# Patient Record
Sex: Male | Born: 1935 | Race: White | Hispanic: No | Marital: Married | Smoking: Former smoker
Health system: Southern US, Community
[De-identification: ages and names within clinical notes are randomized; demographics above are authoritative.]

## PROBLEM LIST (undated history)

## (undated) DIAGNOSIS — R918 Other nonspecific abnormal finding of lung field: Secondary | ICD-10-CM

## (undated) DIAGNOSIS — Z789 Other specified health status: Secondary | ICD-10-CM

## (undated) DIAGNOSIS — I2510 Atherosclerotic heart disease of native coronary artery without angina pectoris: Secondary | ICD-10-CM

## (undated) DIAGNOSIS — E785 Hyperlipidemia, unspecified: Secondary | ICD-10-CM

## (undated) DIAGNOSIS — I340 Nonrheumatic mitral (valve) insufficiency: Secondary | ICD-10-CM

## (undated) DIAGNOSIS — I10 Essential (primary) hypertension: Secondary | ICD-10-CM

## (undated) DIAGNOSIS — I714 Abdominal aortic aneurysm, without rupture: Secondary | ICD-10-CM

## (undated) DIAGNOSIS — I255 Ischemic cardiomyopathy: Secondary | ICD-10-CM

## (undated) DIAGNOSIS — I4821 Permanent atrial fibrillation: Secondary | ICD-10-CM

## (undated) DIAGNOSIS — I5022 Chronic systolic (congestive) heart failure: Secondary | ICD-10-CM

## (undated) HISTORY — DX: Abdominal aortic aneurysm, without rupture: I71.4

## (undated) HISTORY — DX: Atherosclerotic heart disease of native coronary artery without angina pectoris: I25.10

## (undated) HISTORY — DX: Nonrheumatic mitral (valve) insufficiency: I34.0

## (undated) HISTORY — DX: Permanent atrial fibrillation: I48.21

## (undated) HISTORY — DX: Hyperlipidemia, unspecified: E78.5

## (undated) HISTORY — DX: Chronic systolic (congestive) heart failure: I50.22

## (undated) HISTORY — PX: ABDOMINAL AORTIC ANEURYSM REPAIR: SHX42

## (undated) HISTORY — DX: Ischemic cardiomyopathy: I25.5

## (undated) HISTORY — PX: OTHER SURGICAL HISTORY: SHX169

## (undated) HISTORY — DX: Essential (primary) hypertension: I10

## (undated) HISTORY — DX: Other nonspecific abnormal finding of lung field: R91.8

## (undated) HISTORY — DX: Other specified health status: Z78.9

---

## 2003-07-04 HISTORY — PX: CORONARY ANGIOPLASTY WITH STENT PLACEMENT: SHX49

## 2004-01-01 DIAGNOSIS — I714 Abdominal aortic aneurysm, without rupture, unspecified: Secondary | ICD-10-CM

## 2004-01-01 HISTORY — DX: Abdominal aortic aneurysm, without rupture: I71.4

## 2004-01-01 HISTORY — DX: Abdominal aortic aneurysm, without rupture, unspecified: I71.40

## 2004-01-25 ENCOUNTER — Other Ambulatory Visit: Payer: Self-pay

## 2004-01-25 DIAGNOSIS — I219 Acute myocardial infarction, unspecified: Secondary | ICD-10-CM

## 2004-01-25 DIAGNOSIS — I2510 Atherosclerotic heart disease of native coronary artery without angina pectoris: Secondary | ICD-10-CM

## 2004-01-25 DIAGNOSIS — Z9861 Coronary angioplasty status: Secondary | ICD-10-CM

## 2004-01-25 HISTORY — DX: Atherosclerotic heart disease of native coronary artery without angina pectoris: I25.10

## 2004-04-02 ENCOUNTER — Encounter: Payer: Self-pay | Admitting: Family Medicine

## 2004-05-02 ENCOUNTER — Ambulatory Visit: Payer: Self-pay | Admitting: Family Medicine

## 2004-05-03 ENCOUNTER — Encounter: Payer: Self-pay | Admitting: Family Medicine

## 2004-05-17 ENCOUNTER — Ambulatory Visit: Payer: Self-pay | Admitting: General Surgery

## 2004-07-03 HISTORY — PX: ABDOMINAL AORTIC ANEURYSM REPAIR: SUR1152

## 2004-11-14 ENCOUNTER — Ambulatory Visit: Payer: Self-pay | Admitting: General Surgery

## 2005-06-27 ENCOUNTER — Other Ambulatory Visit: Payer: Self-pay

## 2005-06-28 ENCOUNTER — Inpatient Hospital Stay: Payer: Self-pay

## 2005-11-07 ENCOUNTER — Ambulatory Visit: Payer: Self-pay | Admitting: General Surgery

## 2006-11-14 ENCOUNTER — Ambulatory Visit: Payer: Self-pay | Admitting: General Surgery

## 2007-07-04 HISTORY — PX: CARDIAC CATHETERIZATION: SHX172

## 2007-11-04 ENCOUNTER — Ambulatory Visit: Payer: Self-pay | Admitting: General Surgery

## 2007-12-02 DIAGNOSIS — T82390A Other mechanical complication of aortic (bifurcation) graft (replacement), initial encounter: Secondary | ICD-10-CM

## 2009-11-08 ENCOUNTER — Other Ambulatory Visit: Payer: Self-pay | Admitting: Family Medicine

## 2010-09-14 ENCOUNTER — Ambulatory Visit: Payer: Self-pay | Admitting: Family Medicine

## 2011-06-20 ENCOUNTER — Emergency Department: Payer: Self-pay | Admitting: Emergency Medicine

## 2011-08-14 ENCOUNTER — Ambulatory Visit: Payer: Self-pay | Admitting: Family Medicine

## 2011-08-21 ENCOUNTER — Ambulatory Visit: Payer: Self-pay | Admitting: Family Medicine

## 2011-09-02 ENCOUNTER — Encounter: Payer: Self-pay | Admitting: Internal Medicine

## 2011-09-03 ENCOUNTER — Observation Stay: Payer: Self-pay | Admitting: Internal Medicine

## 2011-09-03 LAB — CBC
HCT: 38.5 % — ABNORMAL LOW (ref 40.0–52.0)
MCH: 31.2 pg (ref 26.0–34.0)
MCV: 95 fL (ref 80–100)
Platelet: 154 10*3/uL (ref 150–440)
RDW: 15.4 % — ABNORMAL HIGH (ref 11.5–14.5)

## 2011-09-03 LAB — URINALYSIS, COMPLETE
Bilirubin,UR: NEGATIVE
Blood: NEGATIVE
Leukocyte Esterase: NEGATIVE
Ph: 6 (ref 4.5–8.0)
Protein: NEGATIVE
Squamous Epithelial: NONE SEEN

## 2011-09-03 LAB — COMPREHENSIVE METABOLIC PANEL
Alkaline Phosphatase: 71 U/L (ref 50–136)
Anion Gap: 14 (ref 7–16)
Bilirubin,Total: 0.9 mg/dL (ref 0.2–1.0)
Chloride: 106 mmol/L (ref 98–107)
Co2: 20 mmol/L — ABNORMAL LOW (ref 21–32)
EGFR (African American): >60
EGFR (Non-African Amer.): 50 — ABNORMAL LOW
Osmolality: 285 (ref 275–301)
SGOT(AST): 28 U/L (ref 15–37)
SGPT (ALT): 21 U/L
Total Protein: 8.1 g/dL (ref 6.4–8.2)

## 2011-09-03 LAB — PROTIME-INR: INR: 2.1

## 2011-09-03 LAB — CK TOTAL AND CKMB (NOT AT ARMC)
CK, Total: 62 U/L (ref 35–232)
CK-MB: 1.4 ng/mL (ref 0.5–3.6)

## 2011-09-03 LAB — PRO B NATRIURETIC PEPTIDE: B-Type Natriuretic Peptide: 3441 pg/mL — ABNORMAL HIGH (ref 0–450)

## 2011-09-04 LAB — CBC WITH DIFFERENTIAL/PLATELET
Basophil #: 0 10*3/uL (ref 0.0–0.1)
Basophil %: 0.5 %
Eosinophil %: 2.3 %
HGB: 11.8 g/dL — ABNORMAL LOW (ref 13.0–18.0)
Lymphocyte #: 1.4 10*3/uL (ref 1.0–3.6)
MCH: 30.8 pg (ref 26.0–34.0)
MCHC: 32.3 g/dL (ref 32.0–36.0)
MCV: 95 fL (ref 80–100)
Monocyte #: 0.4 10*3/uL (ref 0.0–0.7)
Monocyte %: 8.4 %
Neutrophil %: 62.5 %
RBC: 3.84 10*6/uL — ABNORMAL LOW (ref 4.40–5.90)
WBC: 5.3 10*3/uL (ref 3.8–10.6)

## 2011-09-04 LAB — BASIC METABOLIC PANEL
Anion Gap: 11 (ref 7–16)
BUN: 24 mg/dL — ABNORMAL HIGH (ref 7–18)
Calcium, Total: 8.8 mg/dL (ref 8.5–10.1)
EGFR (African American): >60
EGFR (Non-African Amer.): >60
Glucose: 97 mg/dL (ref 65–99)
Osmolality: 287 (ref 275–301)
Potassium: 4.1 mmol/L (ref 3.5–5.1)
Sodium: 142 mmol/L (ref 136–145)

## 2011-09-04 LAB — MAGNESIUM: Magnesium: 1.8 mg/dL

## 2011-09-04 LAB — LIPID PANEL
Cholesterol: 155 mg/dL (ref 0–200)
HDL Cholesterol: 24 mg/dL — ABNORMAL LOW (ref 40–60)
Triglycerides: 84 mg/dL (ref 0–200)

## 2011-09-04 LAB — TROPONIN I: Troponin-I: 0.02 ng/mL

## 2011-09-04 LAB — HEMOGLOBIN A1C: Hemoglobin A1C: 6.2 % (ref 4.2–6.3)

## 2011-12-01 ENCOUNTER — Encounter: Payer: Self-pay | Admitting: Cardiology

## 2011-12-01 ENCOUNTER — Encounter: Payer: Self-pay | Admitting: Internal Medicine

## 2011-12-01 ENCOUNTER — Ambulatory Visit (INDEPENDENT_AMBULATORY_CARE_PROVIDER_SITE_OTHER): Payer: Medicare Other | Admitting: Internal Medicine

## 2011-12-01 VITALS — BP 108/66 | HR 85 | Ht 73.5 in | Wt 208.8 lb

## 2011-12-01 DIAGNOSIS — I2589 Other forms of chronic ischemic heart disease: Secondary | ICD-10-CM

## 2011-12-01 DIAGNOSIS — I454 Nonspecific intraventricular block: Secondary | ICD-10-CM | POA: Insufficient documentation

## 2011-12-01 DIAGNOSIS — I255 Ischemic cardiomyopathy: Secondary | ICD-10-CM

## 2011-12-01 DIAGNOSIS — I5022 Chronic systolic (congestive) heart failure: Secondary | ICD-10-CM

## 2011-12-01 DIAGNOSIS — I455 Other specified heart block: Secondary | ICD-10-CM

## 2011-12-01 DIAGNOSIS — I4891 Unspecified atrial fibrillation: Secondary | ICD-10-CM

## 2011-12-01 DIAGNOSIS — I259 Chronic ischemic heart disease, unspecified: Secondary | ICD-10-CM | POA: Insufficient documentation

## 2011-12-01 MED ORDER — METOPROLOL SUCCINATE ER 50 MG PO TB24: 50.0000 mg | ORAL_TABLET | Freq: Every day | ORAL | Status: DC

## 2011-12-01 MED ORDER — LISINOPRIL 2.5 MG PO TABS: 2.5000 mg | ORAL_TABLET | Freq: Every day | ORAL | Status: DC

## 2011-12-01 MED ORDER — DIGOXIN 125 MCG PO TABS: 0.1250 mg | ORAL_TABLET | Freq: Every day | ORAL | Status: DC

## 2011-12-01 NOTE — Assessment & Plan Note (Signed)
As above.

## 2011-12-01 NOTE — Assessment & Plan Note (Signed)
Take QRS duration of 136 and an IVCD pattern, it would not be appropriate to implant CRT at the time of ICD implantation unless we are going to accompany it by AV junction ablation.

## 2011-12-01 NOTE — Assessment & Plan Note (Signed)
The patient has a history of MI left ventricular dysfunction despite beta blockers and ACE inhibitors and congestive heart failure-class II-3. There are 2 issues. The first is that he he is appropriately considered for an ICD with the mitigating factors been appreciated of age and ischemic heart disease. The other issue though is atrial fibrillation with a rapid rate. On his treadmill his heart rate was 130 after 1 minute of exercise and this was reproduced her which is a little bit of lifting of his leg. Control of his heart rate I think give [place] our best chance of symptom control. He was previously on metoprolol succinate and this was recently changed to carvedilol and while he did not notice an appreciable difference on the former drug, I still think is a reasonable choice given his limited blood pressure. In addition, we will add digoxin not withstanding its potential risks given his left ventricular dysfunction and atrial fibrillation.  We will anticipate a Holter monitor in 3-4 weeks to see to what degree we have been able to control his heart rate. In the event that we have done so with exertion, we will then follow his left ventricular function. In the event that we cannot control his heart rate with exertion with medications, I would recommend CRT and AV junction ablation.

## 2011-12-01 NOTE — Patient Instructions (Signed)
Your physician has recommended you make the following change in your medication:  1) Stop coreg (carvedilol). 2) Start toprol (metoprolol succinate) 50 mg one tablet by mouth once daily- please call Dr. Odessa Fleming nurse, Herbert Seta at [phone] today, and let me know what strength tablet you have at home. 3) Decrease lisinopril to 2.5 mg once daily. 4) Start digoxin 0.125 mg once daily.  Your physician has recommended that you wear a 24 hour holter monitor in 2-3 weeks in the [place] office. Holter monitors are medical devices that record the heart's electrical activity. Doctors most often use these monitors to diagnose arrhythmias. Arrhythmias are problems with the speed or rhythm of the heartbeat. The monitor is a small, portable device. You can wear one while you do your normal daily activities. This is usually used to diagnose what is causing palpitations/syncope (passing out).  Your physician recommends that you schedule a follow-up appointment in: 3-4 weeks with Dr. Graciela Husbands in the [place] office.

## 2011-12-01 NOTE — Progress Notes (Signed)
History and Physical  Patient ID: Joseph Barton MRN: [id number], SOB: 11/11/1926 78 y.o. Date of Encounter: 12/01/2011, 10:18 AM  Primary Physician: Lorie Phenix, MD, MD Primary Cardiologist: AP Primary Electrophysiologist:  SK  Chief Complaint: ICD  History of Present Illness: Joseph Barton is a 78 y.o. male  with a history of ischemic heart disease and prior stenting and MI, permanent atrial fibrillation who is referred for consideration of ICD implantation. His insurance does not allow him to be followed in the [facility] network.  He underwent Myoview scanning April 2013 demonstrating an ejection fraction of 13%. He has been on beta blockers and ACE inhibitors for a long time. Echocardiogram confirmed depressed left ventricular function moderate MR and TR and there was no evidence of ischemia on his Myoview. Electrocardiogram measured March 2013 demonstrating significantly rapid rates.  His major complaint is exercise intolerance associated with tachycardia palpitations. Interestingly on his treadmill test in March his heart rate was 130 at 1 minute of exercise. He has some orthopnea but no nocturnal dyspnea he has no edema.  He has a remote history of syncope.  Past Medical History  Diagnosis Date  . Ischemic cardiomyopathy   . Atrial fibrillation, chronic   . Hyperlipidemia   . Myocardial infarction 01-25-2004    Status post anterior myocardial infarction  . Abdominal aortic aneurysm 01-2004    4.7 x 4.7 cm.  . Pulmonary nodules     Noted on abdominal CT     Past Surgical History  Procedure Date  . Cypher stents to lad     01/25/2004  . Abdominal aortic aneurysm repair     12/02/2007 [facility]      Current Outpatient Prescriptions  Medication Sig Dispense Refill  . Bioflavonoid Products (BIOFLEX PO) Take by mouth.      . carvedilol (COREG) 6.25 MG tablet Take 6.25 mg by mouth 2 (two) times daily with a meal.      . fish oil-omega-3 fatty acids 1000 MG capsule Take  1,200 mg by mouth daily.       . furosemide (LASIX) 20 MG tablet Take 20 mg by mouth daily.       Marland Kitchen lisinopril (PRINIVIL,ZESTRIL) 5 MG tablet Take 5 mg by mouth daily.      . niacin 500 MG CR capsule Take 500 mg by mouth. Take three capsules by mouth once daily.       Marland Kitchen omeprazole (PRILOSEC) 40 MG capsule Take 40 mg by mouth daily.      Marland Kitchen warfarin (COUMADIN) 2.5 MG tablet Take 2.5 mg by mouth as directed.         Allergies: No Known Allergies   History  Substance Use Topics  . Smoking status: Former Smoker    Quit date: 12/01/1987  . Smokeless tobacco: Not on file  . Alcohol Use: No      Family History  Problem Relation Age of Onset  . Heart attack Father 43    Died w/ Heart attack  . Heart attack Brother       ROS:  Please see the history of present illness.     All other systems reviewed and negative.   Vital Signs: Blood pressure 108/66, pulse 85, height 6' 1.5" (1.867 m), weight 208 lb 12.8 oz (94.711 kg).  PHYSICAL EXAM: General:  Well nourished, well developed male in no acute distress HEENT: normal Lymph: no adenopathy Neck: no JVD Endocrine:  No thryomegaly Vascular: No carotid bruits; FA pulses 2+ bilaterally without bruits  Cardiac:  normal S1, S2; irregular rate and rhythm with a 2-3/6 murmur at the apex Back: without kyphosis/scoliosis, no CVA tenderness Lungs:  clear to auscultation bilaterally, no wheezing, rhonchi or rales Abd: soft, nontender, no hepatomegaly Ext: Trace edema Musculoskeletal:  No deformities, BUE and BLE strength normal and equal Skin: warm and dry Neuro:  CNs 2-12 intact, no focal abnormalities noted Psych:  Normal affect   EKG:  Atrial fibrillation and 85 Intervals-/0.14/0.40 Axis is leftward at -64 IVCD  Labs:       ASSESSMENT AND PLAN:

## 2011-12-01 NOTE — Progress Notes (Signed)
Addended by: Sherri Rad C on: 12/01/2011 02:45 PM   Modules accepted: Orders

## 2011-12-19 ENCOUNTER — Encounter (INDEPENDENT_AMBULATORY_CARE_PROVIDER_SITE_OTHER): Payer: Medicare Other

## 2011-12-19 DIAGNOSIS — R55 Syncope and collapse: Secondary | ICD-10-CM

## 2011-12-19 DIAGNOSIS — R002 Palpitations: Secondary | ICD-10-CM

## 2011-12-19 DIAGNOSIS — I255 Ischemic cardiomyopathy: Secondary | ICD-10-CM

## 2011-12-22 ENCOUNTER — Encounter: Payer: Self-pay | Admitting: Internal Medicine

## 2012-01-02 ENCOUNTER — Ambulatory Visit (INDEPENDENT_AMBULATORY_CARE_PROVIDER_SITE_OTHER): Payer: Medicare Other | Admitting: Internal Medicine

## 2012-01-02 ENCOUNTER — Encounter: Payer: Self-pay | Admitting: Internal Medicine

## 2012-01-02 VITALS — BP 112/80 | HR 76 | Ht 73.5 in | Wt 205.8 lb

## 2012-01-02 DIAGNOSIS — I2589 Other forms of chronic ischemic heart disease: Secondary | ICD-10-CM

## 2012-01-02 DIAGNOSIS — I4891 Unspecified atrial fibrillation: Secondary | ICD-10-CM

## 2012-01-02 DIAGNOSIS — I255 Ischemic cardiomyopathy: Secondary | ICD-10-CM

## 2012-01-02 DIAGNOSIS — I5022 Chronic systolic (congestive) heart failure: Secondary | ICD-10-CM

## 2012-01-02 NOTE — Assessment & Plan Note (Signed)
Atrial fibrillation is persistent. Heart rate is much better controlled. Heart rates range from 38-108 with averages in the 50 range. We will decrease his metoprolol from 50-25 mg daily. We will measure his digoxin level

## 2012-01-02 NOTE — Patient Instructions (Addendum)
Your physician wants you to follow-up end of August with Dr. Graciela Husbands. You will receive a reminder letter in the mail two months in advance. If you don't receive a letter, please call our office to schedule the follow-up appointment.  Your physician has recommended you make the following change in your medication: Decrease metoprolol to 25 mg daily.  Your physician has requested that you have an echocardiogram with Dr. Darrold Junker. Echocardiography is a painless test that uses sound waves to create images of your heart. It provides your doctor with information about the size and shape of your heart and how well your heart's chambers and valves are working. This procedure takes approximately one hour. There are no restrictions for this procedure.  You need to come back at your convenience for lab work.

## 2012-01-02 NOTE — Assessment & Plan Note (Signed)
We will plan to reassess left ventricular function and to determine whether he may still need a ICD now that we have accomplished rate control

## 2012-01-02 NOTE — Progress Notes (Signed)
  HPI  Joseph Barton is a 78 y.o. male  Seen in followup for cardiomyopathy and consideration for ICD implantation in the setting of ischemic heart disease and a broad IVCD.  He was noted to have atrial fibrillation with a rapid rate. It was elected to pursue rate control initially with medications and potentially if that didn't work AV junction and ablation  He comes in today in followup of Holter monitor undertaking undertaken following the initiation of digoxin and changing his carvedilol to metoprolol. He is feeling much better with improved exercise tolerance. There is no lightheadedness.   Past Medical History  Diagnosis Date  . Ischemic cardiomyopathy   . Atrial fibrillation, chronic   . Hyperlipidemia   . Myocardial infarction 01-25-2004    Status post anterior myocardial infarction  . Abdominal aortic aneurysm 01-2004    4.7 x 4.7 cm.  . Pulmonary nodules     Noted on abdominal CT    Past Surgical History  Procedure Date  . Cypher stents to lad     01/25/2004  . Abdominal aortic aneurysm repair     12/02/2007 [facility]    Current Outpatient Prescriptions  Medication Sig Dispense Refill  . Bioflavonoid Products (BIOFLEX PO) Take by mouth.      . digoxin (LANOXIN) 0.125 MG tablet Take 1 tablet (0.125 mg total) by mouth daily.  90 tablet  3  . furosemide (LASIX) 20 MG tablet Take 20 mg by mouth daily.       Marland Kitchen lisinopril (PRINIVIL,ZESTRIL) 2.5 MG tablet Take 1 tablet (2.5 mg total) by mouth daily.  90 tablet  3  . metoprolol succinate (TOPROL-XL) 50 MG 24 hr tablet Take 1 tablet (50 mg total) by mouth daily. Take with or immediately following a meal.  90 tablet  3  . niacin 500 MG CR capsule Take three capsules by mouth once daily.      . Omega-3 Fatty Acids (FISH OIL) 1200 MG CAPS Take 1,200 mg by mouth daily.      Marland Kitchen omeprazole (PRILOSEC) 20 MG capsule Take 20 mg by mouth daily.      Marland Kitchen warfarin (COUMADIN) 2.5 MG tablet Take 2.5 mg by mouth as directed.        No  Known Allergies  Review of Systems negative except from HPI and PMH  Physical Exam BP 112/80  Pulse 76  Ht 6' 1.5" (1.867 m)  Wt 205 lb 12 oz (93.328 kg)  BMI 26.78 kg/m2 Well developed and well nourished in no acute distress HENT normal E scleral and icterus clear Neck Supple JVP flat; carotids brisk and full Clear to ausculation irregularly irregular And slow rate and rhythm, no murmurs gallops or rub Soft with active bowel sounds No clubbing cyanosis none Edema Alert and oriented, grossly normal motor and sensory function Skin Warm and Dry  Holter monitor demonstrated reasonable heart rate control. If there is some bradycardia it appears to be asymptomatic. There were no pauses of greater than 3 seconds.  Assessment and  Plan

## 2012-01-02 NOTE — Assessment & Plan Note (Signed)
Improved as noted above. He may also be a candidate for aldosterone antagonism

## 2012-01-05 ENCOUNTER — Other Ambulatory Visit (INDEPENDENT_AMBULATORY_CARE_PROVIDER_SITE_OTHER): Payer: Medicare Other

## 2012-01-05 DIAGNOSIS — I4891 Unspecified atrial fibrillation: Secondary | ICD-10-CM

## 2012-01-05 DIAGNOSIS — I255 Ischemic cardiomyopathy: Secondary | ICD-10-CM

## 2012-01-05 DIAGNOSIS — I5022 Chronic systolic (congestive) heart failure: Secondary | ICD-10-CM

## 2012-02-19 ENCOUNTER — Telehealth: Payer: Self-pay

## 2012-02-19 NOTE — Telephone Encounter (Signed)
Pt's wife called, says you left message on machine last Friday 02/16/12, but wife does not know why.  She would like you to call her back. Thanks!

## 2012-02-20 NOTE — Telephone Encounter (Signed)
Notified patient's wife just checking to make sure the patient had an Echo scheduled before the office visit with Dr. Graciela Husbands on Aug. 29, 2013.

## 2012-02-29 ENCOUNTER — Encounter: Payer: Self-pay | Admitting: Internal Medicine

## 2012-02-29 ENCOUNTER — Ambulatory Visit (INDEPENDENT_AMBULATORY_CARE_PROVIDER_SITE_OTHER): Payer: Medicare Other | Admitting: Internal Medicine

## 2012-02-29 VITALS — BP 102/60 | HR 60 | Ht 73.0 in | Wt 204.0 lb

## 2012-02-29 DIAGNOSIS — I4891 Unspecified atrial fibrillation: Secondary | ICD-10-CM

## 2012-02-29 NOTE — Progress Notes (Signed)
[facility] Patient Care Team: Lorie Phenix, MD as PCP - General (Family Medicine)   HPI  Joseph Barton is a 78 y.o. male Seen in followup for cardiomyopathy and consideration for ICD implantation in the setting of ischemic heart disease and a broad QRS with nonspecific IVCD. He been noted to have atrial fibrillation with a rapid rate it was elected to pursue rate control initially seen at an impact on left ventricular function. He was seen last at the beginning of July. Much improved on digoxin and carvedilol>> metoprolol  repea echo EF35% with much improved symptoms   Past Medical History  Diagnosis Date  . Ischemic cardiomyopathy   . Atrial fibrillation, chronic   . Hyperlipidemia   . Myocardial infarction 01-25-2004    Status post anterior myocardial infarction  . Abdominal aortic aneurysm 01-2004    4.7 x 4.7 cm.  . Pulmonary nodules     Noted on abdominal CT    Past Surgical History  Procedure Date  . Cypher stents to lad     01/25/2004  . Abdominal aortic aneurysm repair     12/02/2007 [facility]    Current Outpatient Prescriptions  Medication Sig Dispense Refill  . Bioflavonoid Products (BIOFLEX PO) Take by mouth.      . digoxin (LANOXIN) 0.125 MG tablet Take 1 tablet (0.125 mg total) by mouth daily.  90 tablet  3  . furosemide (LASIX) 20 MG tablet Take 20 mg by mouth daily.       Marland Kitchen lisinopril (PRINIVIL,ZESTRIL) 2.5 MG tablet Take 1 tablet (2.5 mg total) by mouth daily.  90 tablet  3  . metoprolol succinate (TOPROL-XL) 25 MG 24 hr tablet Take 25 mg by mouth daily.      . niacin 500 MG CR capsule Take three capsules by mouth once daily.      . Omega-3 Fatty Acids (FISH OIL) 1200 MG CAPS Take 1,200 mg by mouth daily.      Marland Kitchen omeprazole (PRILOSEC) 20 MG capsule Take 20 mg by mouth daily.      Marland Kitchen warfarin (COUMADIN) 2.5 MG tablet Take 2.5 mg by mouth as directed.        No Known Allergies  Review of Systems negative except from HPI and PMH  Physical Exam BP 102/60   Pulse 60  Ht 6\' 1"  (1.854 m)  Wt 204 lb (92.534 kg)  BMI 26.91 kg/m2 Well developed and nourished in no acute distress HENT normal Neck supple with JVP-flat Carotids brisk and full without bruits Clear Irregularly irregular rate and rhythm with controlled ventricular response, no murmurs or gallops Abd-soft with active BS without hepatomegaly No Clubbing cyanosis edema Skin-warm and dry A & Oriented  Grossly normal sensory and motor function  >ecg ECG: AF  @65                Assessment and  Plan

## 2012-02-29 NOTE — Patient Instructions (Addendum)
Your physician wants you to follow-up as needed.  You will receive a reminder letter in the mail two months in advance. If you don't receive a letter, please call our office to schedule the follow-up appointment.  

## 2013-09-27 DIAGNOSIS — Z789 Other specified health status: Secondary | ICD-10-CM | POA: Insufficient documentation

## 2013-09-27 DIAGNOSIS — E785 Hyperlipidemia, unspecified: Secondary | ICD-10-CM | POA: Insufficient documentation

## 2013-09-27 DIAGNOSIS — I255 Ischemic cardiomyopathy: Secondary | ICD-10-CM | POA: Insufficient documentation

## 2013-09-27 DIAGNOSIS — R918 Other nonspecific abnormal finding of lung field: Secondary | ICD-10-CM | POA: Insufficient documentation

## 2014-02-27 LAB — LIPID PANEL
Cholesterol: 251 mg/dL — AB (ref 0–200)
HDL: 40 mg/dL (ref 35–70)
LDL Cholesterol: 183 mg/dL
LDl/HDL Ratio: 4.6
Triglycerides: 139 mg/dL (ref 40–160)

## 2014-07-03 HISTORY — PX: SKIN SURGERY: SHX2413

## 2014-07-21 DIAGNOSIS — I4891 Unspecified atrial fibrillation: Secondary | ICD-10-CM | POA: Diagnosis not present

## 2014-08-18 DIAGNOSIS — I4891 Unspecified atrial fibrillation: Secondary | ICD-10-CM | POA: Diagnosis not present

## 2014-09-15 DIAGNOSIS — I4891 Unspecified atrial fibrillation: Secondary | ICD-10-CM | POA: Diagnosis not present

## 2014-10-09 DIAGNOSIS — I509 Heart failure, unspecified: Secondary | ICD-10-CM | POA: Diagnosis not present

## 2014-10-09 DIAGNOSIS — G629 Polyneuropathy, unspecified: Secondary | ICD-10-CM | POA: Diagnosis not present

## 2014-10-09 LAB — BASIC METABOLIC PANEL
BUN: 21 mg/dL (ref 4–21)
CREATININE: 1.1 mg/dL (ref 0.6–1.3)
GLUCOSE: 98 mg/dL
POTASSIUM: 4.7 mmol/L (ref 3.4–5.3)
SODIUM: 138 mmol/L (ref 137–147)

## 2014-10-09 LAB — CBC AND DIFFERENTIAL
HCT: 43 % (ref 41–53)
Hemoglobin: 14.5 g/dL (ref 13.5–17.5)
Neutrophils Absolute: 2 /uL
Platelets: 167 10*3/uL (ref 150–399)
WBC: 4.6 10^3/mL

## 2014-10-09 LAB — HEPATIC FUNCTION PANEL
ALK PHOS: 70 U/L (ref 25–125)
ALT: 10 U/L (ref 10–40)
AST: 21 U/L (ref 14–40)
Bilirubin, Total: 0.8 mg/dL

## 2014-10-09 LAB — TSH: TSH: 4.71 u[IU]/mL (ref 0.41–5.90)

## 2014-10-09 LAB — HEMOGLOBIN A1C: HEMOGLOBIN A1C: 6

## 2014-10-15 DIAGNOSIS — H6123 Impacted cerumen, bilateral: Secondary | ICD-10-CM | POA: Diagnosis not present

## 2014-10-15 DIAGNOSIS — H6063 Unspecified chronic otitis externa, bilateral: Secondary | ICD-10-CM | POA: Diagnosis not present

## 2014-10-25 NOTE — Discharge Summary (Signed)
PATIENT NAME:  Joseph Barton, Joseph Barton MR#:  [id number] DATE OF BIRTH:  December 14, 1926  DATE OF ADMISSION:  09/03/2011 DATE OF DISCHARGE:  09/04/2011  PRIMARY CARE PHYSICIAN: Margarita Rana, MD  FINAL DIAGNOSES:  1. Acute congestive heart failure from rapid atrial fibrillation.  2. Rapid atrial fibrillation.  3. Coronary artery disease.  4. Acute renal failure, resolved.  5. History of pulmonary embolus in the past.  6. Gastroesophageal reflux disease.   DISCHARGE MEDICATIONS: 1. Lisinopril 5 mg daily.  2. Coumadin 2.5 mg daily.  3. Coreg increased to 6.25 mg twice a day.  4. Lasix 20 mg p.o. daily.  5. Omeprazole 20 mg p.o. daily.   ACTIVITY: As tolerated.   DIET: Low sodium diet.   DISCHARGE FOLLOWUP: Followup with Dr. Saralyn Pilar this week if possible and with Dr. Venia Minks in two weeks.   REASON FOR ADMISSION: The patient was admitted on 09/03/2011 and discharge 09/04/2011. He came in with acute shortness of breath.   HISTORY OF PRESENT ILLNESS: This is an 81 year old man with history of MI status post stenting, congestive heart failure with unknown ejection fraction, and history of atrial fibrillation on Coumadin who presented with acute shortness of breath, progressive in nature, acutely worsening last night. He was found to be in atrial fibrillation with rapid ventricular response with heart rate into the 30s. He was given one dose of diltiazem and had better heart rate after that. He was started with low-dose Cardizem and continued on his Coreg, low dose. He was given IV Lasix.   LABS/STUDIES: EKG showed atrial fibrillation, rapid ventricular response, right bundle branch block, left anterior fascicular block.   Troponin negative. TSH 3.41. INR 2.1. White blood cell count 5.8, hemoglobin and hematocrit 12.6 and 38.5, platelet count 154. Glucose 122, BUN 26, creatinine 1.44, sodium 140, potassium 4.4, chloride 106, CO2 20, calcium 8.9. Liver function tests normal. BNP 3441.   Chest x-ray was  consistent with congestive heart failure.  Urinalysis was negative.   The next two troponins were negative. LDL 114, HDL 24, triglycerides 84. Hemoglobin A1c 6.2. Repeat creatinine 1.14.   HOSPITAL COURSE PER PROBLEM LIST:  1. Acute congestive heart failure: I believe this is secondary to rapid atrial fibrillation. Once heart rate was controlled, the patient was diuresed completely. An echocardiogram was done as an outpatient through Dr. Saralyn Pilar' office. Ejection fraction is unknown at this point. The patient was treated with a beta blocker, Coreg 6.25 mg twice a day, lisinopril 5 mg daily, and Lasix 20 mg p.o. daily. The patient's lungs were clear upon discharge.  2. Rapid atrial fibrillation: The patient was initially given IV Cardizem and then switched over to low dose p.o. Cardizem. Since the patient's heart rate went down into the 50s to 60s area, but was up at the 80s and 90s upon discharge, it was felt that we can do it with one medication. I spoke with Dr. Nehemiah Massed who recommended increasing the Coreg to twice a day and stopping the Cardizem.  3. Coronary artery disease: The patient is on Coreg. Cardiac enzymes negative.  4. Acute renal failure, resolved, even with diuresis.  5. History of pulmonary embolism in the past: The patient is on Coumadin, most likely for the atrial fibrillation. INR is therapeutic.          DISPOSITION: The patient was discharged home in stable condition.   TIME SPENT ON DISCHARGE: 35 minutes. ____________________________ Tana Conch. Leslye Peer, MD [id number]:slb D: 09/04/2011 17:14:36 ET T: 09/05/2011 14:08:50 ET JOB#: [id number]  cc: Tana Conch. Leslye Peer, MD, <Dictator> Jerrell Belfast, MD Isaias Cowman, MD Marisue Brooklyn MD ELECTRONICALLY SIGNED 09/06/2011 13:12

## 2014-10-25 NOTE — H&P (Signed)
PATIENT NAME:  Joseph Barton, Joseph Barton MR#:  [id number] DATE OF BIRTH:  [birth year]  DATE OF ADMISSION:  09/03/2011  REFERRING PHYSICIAN: Robb Matar, MD     PRIMARY CARE PHYSICIAN: Jerrell Belfast, MD   CARDIOLOGIST: Isaias Cowman, MD   CHIEF COMPLAINT: Acute onset shortness of breath.   HISTORY OF PRESENT ILLNESS: The patient is an 81 year old Caucasian male with history of myocardial infarction, status post stenting, congestive heart failure, unknown ejection fraction, and history of atrial fibrillation on Coumadin, who presents with acute onset shortness of breath which is progressive in nature with acutely worsening last night about 3:00 a.m. The patient states that over the last several days he has felt increased dyspnea on exertion and shortness of breath. He gets more labored with activities. He has had several x-rays of the chest in the last month or so for a cough which had initially been productive with yellow sputum without having any fevers or chills. He went to his cardiologist's office and had an echo done on Friday. He was told by the echo tech that, "Your heart is racing." He did not know what the rate was at that time. He has had two nights of orthopnea, sleeping on two pillows where he usually sleeps on one pillow. He denies any lower extremity edema, however. On arrival, he was found with atrial fibrillation with RVR with rates in the 130s by EMS strip and with [place] it was 120s. He got Lasix 20 mg IV as well as diltiazem 10 mg IV x1. He has had a good urine output, and the heart rate is better in the last 80s and 90s. His shortness of breath has significantly improved as well. Hospitalist Services were contacted for further evaluation and management.   PAST MEDICAL HISTORY:  1. Congestive heart failure, unclear if this is a systolic or diastolic or what the ejection fraction is.  2. Hypertension, but blood pressure is generally on the lower side. 3. Myocardial infarction, status post  stenting.  4. Atrial fibrillation.  5. History of PE.    PAST SURGICAL HISTORY: Abdominal aneurysm repair in 2009, status post stenting.   SOCIAL HISTORY: He quit tobacco multiple years ago. No alcohol or drugs. He is a retired Games developer.  MEDICATIONS:  1. Niacin. 2. Coenzyme Q10 over-the-counter. 3. Furosemide, unknown dose. 4. Lisinopril 5 mg daily.  5. Coreg 3.125 mg b.i.d.  6. Coumadin 2.5 mg daily.  7. Omeprazole 20 mg daily.   FAMILY HISTORY: Brother, sister and father with coronary artery disease.   REVIEW OF SYSTEMS: CONSTITUTIONAL: No fever. There is some fatigue. No weight changes. EYES: No blurry vision or double vision. ENT: No tinnitus or hearing loss. RESPIRATORY: Had a productive cough, which has resolved. No wheezing. There is worsening dyspnea on exertion. No painful respiration. CARDIOVASCULAR: No chest pains. Positive for orthopnea. No lower extremity edema. History of atrial fibrillation and high blood pressure. Some dyspnea on exertion recently. GI: No nausea, vomiting, or diarrhea. Abdominal pain. GENITOURINARY: No dysuria or hematuria. ENDOCRINE: No polyuria or nocturia. HEMATOLOGIC/LYMPHATIC: No anemia or easy bruising. SKIN: No rashes. MUSCULOSKELETAL: Denies arthritis. NEUROLOGIC: No numbness, weakness, or dementia. PSYCHIATRIC: No anxiety or insomnia.   PHYSICAL EXAMINATION:  VITAL SIGNS: Pulse on arrival 130, currently 87, temperature 97.7, respiratory rate 18, blood pressure 105/57, oxygen saturation 99% stated as on room air, however, he is on 4 liters of oxygen.   GENERAL: The patient is an elderly Caucasian male lying in bed in no obvious distress, talking  in full sentences.   HEENT: Normocephalic, atraumatic. Pupils are equal and reactive. Anicteric sclerae. Moist mucous membranes.   NECK: Supple. No JVD or hepatojugular reflex noted.  LUNGS: Scattered crackles, good air entry.   CARDIOVASCULAR: S1, S2, irregularly irregular and mild murmur left  sternum.   ABDOMEN: Soft, nontender, hyperactive bowel sounds. No organomegaly noted.   EXTREMITIES: No significant edema.   NEUROLOGICAL: Cranial nerves II through XII are grossly intact. Strength five out of five in all extremities.   PSYCHIATRIC: Awake, alert, oriented x3. Affect is appropriate. Mood is appropriate.   LABORATORY, DIAGNOSTIC AND RADIOLOGICAL DATA:  BNP elevated at 3441. Glucose 122, BUN 26, creatinine 1.44, potassium 4.4.  LFTs within normal limits.  Troponin negative x1. TSH 3.41.  WBC 5.8, hemoglobin 12.6, hematocrit 38.5, platelets 154, INR 2.1.  Urinalysis: No evidence of infection.  X-ray of the chest: Findings consistent with congestive heart failure with mild interstitial edema, worse than 08/21/2011.  EKG showing atrial fibrillation with RVR, rate is 126. Left anterior fascicular and right bundle branch blocks. Previous septal infarct, no acute ST elevations or depressions.   ASSESSMENT AND PLAN: We have an 81 year old Caucasian male with a history of congestive heart failure, unknown ejection fraction, who has undergone echocardiogram as an outpatient two days ago, with a history of atrial fibrillation-on Coumadin, coronary artery disease, status post myocardial infarction and stenting, as well as repair of abdominal aortic aneurysm, who presents with progressive shortness of breath likely from worsening congestive heart failure and atrial fibrillation with RVR.   1. Atrial fibrillation with rapid ventricular response: He has had Cardizem 10 mg IV, and rate has been controlled. He is not on Cardizem as an outpatient and is on a low dose of Coreg 3.125 mg for rate control. This likely is in the setting of the patient's blood pressure being on the lower side. He is on Coumadin, and INR is therapeutic at 2.1. At this point, I would try a dose of 30 mg of Cardizem every 6 hours to see if the blood pressure can tolerate it. He is rate controlled currently, and his  shortness of breath has improved. I would continue the low-dose Coreg. 2. Shortness of breath:  This likely is acute on chronic congestive heart failure. However, as he recently had an echocardiogram I would not do it on this admission. I would consult with [facility] and obtain the echocardiogram result. I would hold the ACE inhibitor, given the acute renal failure. He is on Lasix as an outpatient; however, he does not know the dose. He denies having increased fluid intake. This likely is in the setting of the atrial fibrillation with the RVR. He has dramatically improved in terms of his breathing and shortness of breath with the Lasix and has had about 1.5 liters of urine output total. I would check ins and outs and daily weights and continue the Lasix at 20 mg IV daily. I  would resume the beta blocker and consider continuing the ACE inhibitor once renal function improves.  3. Acute renal failure: This likely is in the setting of congestive heart failure and atrial fibrillation with probably poor forward flow. I would hold ACE inhibitor and recheck this in the a.m. with gentle diuresis.  4. Coronary artery disease: The patient denies having any chest pains, and troponins are negative x1. The patient is not on aspirin as an outpatient. I will check a lipid profile.  5. Hypertension: The patient's blood pressure generally is on the  lower side, per patient; and he is on low dose Coreg and ACE inhibitor. I would see how his blood pressure does on the Cardizem, and if he cannot tolerate it I would probably stop that and go up on Coreg, if blood pressure can tolerate.  6. Deep vein thrombosis/GI prophylaxis: The patient is on Coumadin. INR is therapeutic. I would continue his omeprazole.   CODE STATUS:  The patient is FULL CODE.     TOTAL TIME SPENT: 50 minutes.   ____________________________ Vivien Presto, MD sa:cbb D: 09/03/2011 10:33:38 ET T: 09/03/2011 11:37:03 ET JOB#: [id number]  cc: Vivien Presto, MD, <Dictator> Jerrell Belfast, MD Isaias Cowman, MD Vivien Presto MD ELECTRONICALLY SIGNED 09/07/2011 15:36

## 2014-10-27 DIAGNOSIS — I4891 Unspecified atrial fibrillation: Secondary | ICD-10-CM | POA: Diagnosis not present

## 2014-11-23 DIAGNOSIS — I259 Chronic ischemic heart disease, unspecified: Secondary | ICD-10-CM | POA: Diagnosis not present

## 2014-11-23 DIAGNOSIS — I214 Non-ST elevation (NSTEMI) myocardial infarction: Secondary | ICD-10-CM | POA: Diagnosis not present

## 2014-11-23 DIAGNOSIS — T82330D Leakage of aortic (bifurcation) graft (replacement), subsequent encounter: Secondary | ICD-10-CM | POA: Diagnosis not present

## 2014-11-23 DIAGNOSIS — I5022 Chronic systolic (congestive) heart failure: Secondary | ICD-10-CM | POA: Diagnosis not present

## 2014-11-27 DIAGNOSIS — I4891 Unspecified atrial fibrillation: Secondary | ICD-10-CM | POA: Diagnosis not present

## 2014-12-25 ENCOUNTER — Ambulatory Visit (INDEPENDENT_AMBULATORY_CARE_PROVIDER_SITE_OTHER): Payer: Medicare Other

## 2014-12-25 ENCOUNTER — Other Ambulatory Visit: Payer: Self-pay

## 2014-12-25 DIAGNOSIS — I4891 Unspecified atrial fibrillation: Secondary | ICD-10-CM | POA: Diagnosis not present

## 2014-12-25 DIAGNOSIS — M1990 Unspecified osteoarthritis, unspecified site: Secondary | ICD-10-CM | POA: Insufficient documentation

## 2014-12-25 LAB — POCT INR
INR: 3
PT: 35.6

## 2014-12-25 NOTE — Patient Instructions (Signed)
Continue current dose of Coumadin 2.5 mg [id number] and other days half a tablet. Return in 4 weeks. Call if you have any questions or concerns.

## 2015-01-22 ENCOUNTER — Ambulatory Visit (INDEPENDENT_AMBULATORY_CARE_PROVIDER_SITE_OTHER): Payer: Medicare Other

## 2015-01-22 DIAGNOSIS — I4891 Unspecified atrial fibrillation: Secondary | ICD-10-CM | POA: Diagnosis not present

## 2015-01-22 LAB — POCT INR
INR: 2.7
PT: 33

## 2015-01-22 NOTE — Patient Instructions (Signed)
INR 2.7   Continue same dose, coumadin 2.5 mg daily(one table) and 1.25 mg(Monday and Thursday) one half table. Come back in 4 weeks Call if any questions or concerns.

## 2015-02-19 ENCOUNTER — Ambulatory Visit (INDEPENDENT_AMBULATORY_CARE_PROVIDER_SITE_OTHER): Payer: Medicare Other

## 2015-02-19 DIAGNOSIS — I4891 Unspecified atrial fibrillation: Secondary | ICD-10-CM

## 2015-02-19 LAB — POCT INR
INR: 2.4
PT: 28.9

## 2015-02-19 NOTE — Patient Instructions (Signed)
Continue current dose of Coumadin 2.5mg  daily (one tablet) and 1.25 mg Monday and Thursday (half a tablet). Return in 4 weeks. Call if any questions or concerns.

## 2015-03-19 ENCOUNTER — Ambulatory Visit (INDEPENDENT_AMBULATORY_CARE_PROVIDER_SITE_OTHER): Payer: Medicare Other

## 2015-03-19 DIAGNOSIS — I4891 Unspecified atrial fibrillation: Secondary | ICD-10-CM | POA: Diagnosis not present

## 2015-03-19 LAB — POCT INR
INR: 2.9
PT: 35.4

## 2015-03-19 NOTE — Patient Instructions (Signed)
Continue current dose of Coumadin 2.5mg  daily (one tablet) and 1.25 mg Monday and Thursday (half a tablet). Return in 4 weeks.

## 2015-04-12 DIAGNOSIS — C4442 Squamous cell carcinoma of skin of scalp and neck: Secondary | ICD-10-CM | POA: Diagnosis not present

## 2015-04-12 DIAGNOSIS — D485 Neoplasm of uncertain behavior of skin: Secondary | ICD-10-CM | POA: Diagnosis not present

## 2015-04-12 DIAGNOSIS — L570 Actinic keratosis: Secondary | ICD-10-CM | POA: Diagnosis not present

## 2015-04-16 ENCOUNTER — Ambulatory Visit (INDEPENDENT_AMBULATORY_CARE_PROVIDER_SITE_OTHER): Payer: Medicare Other

## 2015-04-16 DIAGNOSIS — I4891 Unspecified atrial fibrillation: Secondary | ICD-10-CM | POA: Diagnosis not present

## 2015-04-16 LAB — POCT INR
INR: 2.8
PT: 33.6

## 2015-04-16 NOTE — Patient Instructions (Signed)
Continue current dose; 2.5mg  daily except 1.25mg  Monday and Thursday.

## 2015-04-28 ENCOUNTER — Other Ambulatory Visit: Payer: Self-pay

## 2015-04-28 DIAGNOSIS — K219 Gastro-esophageal reflux disease without esophagitis: Secondary | ICD-10-CM | POA: Insufficient documentation

## 2015-04-28 MED ORDER — OMEPRAZOLE 20 MG PO CPDR: 20.0000 mg | DELAYED_RELEASE_CAPSULE | Freq: Every day | ORAL | Status: DC

## 2015-05-14 ENCOUNTER — Ambulatory Visit (INDEPENDENT_AMBULATORY_CARE_PROVIDER_SITE_OTHER): Payer: Medicare Other

## 2015-05-14 DIAGNOSIS — I4891 Unspecified atrial fibrillation: Secondary | ICD-10-CM | POA: Diagnosis not present

## 2015-05-14 DIAGNOSIS — Z23 Encounter for immunization: Secondary | ICD-10-CM

## 2015-05-14 LAB — POCT INR: INR: 2.2

## 2015-06-09 DIAGNOSIS — L570 Actinic keratosis: Secondary | ICD-10-CM | POA: Diagnosis not present

## 2015-06-09 DIAGNOSIS — L578 Other skin changes due to chronic exposure to nonionizing radiation: Secondary | ICD-10-CM | POA: Diagnosis not present

## 2015-06-09 DIAGNOSIS — C4442 Squamous cell carcinoma of skin of scalp and neck: Secondary | ICD-10-CM | POA: Diagnosis not present

## 2015-06-11 ENCOUNTER — Other Ambulatory Visit: Payer: Self-pay

## 2015-06-11 ENCOUNTER — Ambulatory Visit (INDEPENDENT_AMBULATORY_CARE_PROVIDER_SITE_OTHER): Payer: Medicare Other

## 2015-06-11 DIAGNOSIS — I4891 Unspecified atrial fibrillation: Secondary | ICD-10-CM

## 2015-06-11 DIAGNOSIS — K219 Gastro-esophageal reflux disease without esophagitis: Secondary | ICD-10-CM

## 2015-06-11 LAB — POCT INR
INR: 2.3
PT: 27.6

## 2015-06-11 MED ORDER — OMEPRAZOLE 20 MG PO CPDR: 20.0000 mg | DELAYED_RELEASE_CAPSULE | Freq: Every day | ORAL | Status: DC

## 2015-06-11 NOTE — Patient Instructions (Signed)
Anticoagulation Dose Instructions as of 06/11/2015      Joseph Barton Tue Wed Thu Fri Sat   New Dose 2.5 mg 1.25 mg 2.5 mg 2.5 mg 1.25 mg 2.5 mg 2.5 mg    Description        Continue current dose of Coumadin 2.5mg  daily (one tablet) and 1.25 mg Monday and Thursday (half a tablet). Return in 4 weeks.

## 2015-07-09 ENCOUNTER — Ambulatory Visit (INDEPENDENT_AMBULATORY_CARE_PROVIDER_SITE_OTHER): Payer: Medicare Other

## 2015-07-09 DIAGNOSIS — I4891 Unspecified atrial fibrillation: Secondary | ICD-10-CM

## 2015-07-09 LAB — POCT INR
INR: 2.8
PT: 33

## 2015-07-09 NOTE — Patient Instructions (Signed)
Continue current dose and follow up in 4 weeks.

## 2015-07-09 NOTE — Progress Notes (Signed)
Anticoagulation Dose Instructions as of 07/09/2015      Joseph Barton Tue Wed Thu Fri Sat   New Dose 2.5 mg 1.25 mg 2.5 mg 2.5 mg 1.25 mg 2.5 mg 2.5 mg    Description        Continue current dose of Coumadin 2.5mg  daily (one tablet) and 1.25 mg Monday and Thursday (half a tablet). Return in 4 weeks.

## 2015-08-09 ENCOUNTER — Other Ambulatory Visit: Payer: Self-pay

## 2015-08-09 DIAGNOSIS — I255 Ischemic cardiomyopathy: Secondary | ICD-10-CM

## 2015-08-09 MED ORDER — DIGOXIN 125 MCG PO TABS: 0.1250 mg | ORAL_TABLET | Freq: Every day | ORAL | Status: DC

## 2015-08-09 NOTE — Telephone Encounter (Signed)
Mail order pharmacy requesting refill. Initially prescribed by Dr. Caryl Comes. Was last filled per chart 10/2012.

## 2015-08-10 ENCOUNTER — Other Ambulatory Visit: Payer: Self-pay

## 2015-08-10 DIAGNOSIS — I4891 Unspecified atrial fibrillation: Secondary | ICD-10-CM

## 2015-08-10 MED ORDER — WARFARIN SODIUM 2.5 MG PO TABS
2.5000 mg | ORAL_TABLET | ORAL | Status: DC
Start: 2015-08-10 — End: 2016-03-13

## 2015-08-13 ENCOUNTER — Ambulatory Visit: Payer: Self-pay

## 2015-08-20 ENCOUNTER — Ambulatory Visit (INDEPENDENT_AMBULATORY_CARE_PROVIDER_SITE_OTHER): Payer: Medicare Other | Admitting: Emergency Medicine

## 2015-08-20 DIAGNOSIS — I4891 Unspecified atrial fibrillation: Secondary | ICD-10-CM

## 2015-08-20 LAB — POCT INR: INR: 2.8

## 2015-09-10 DIAGNOSIS — G629 Polyneuropathy, unspecified: Secondary | ICD-10-CM | POA: Insufficient documentation

## 2015-09-10 DIAGNOSIS — G4730 Sleep apnea, unspecified: Secondary | ICD-10-CM | POA: Insufficient documentation

## 2015-09-10 DIAGNOSIS — I10 Essential (primary) hypertension: Secondary | ICD-10-CM | POA: Insufficient documentation

## 2015-09-10 DIAGNOSIS — C4492 Squamous cell carcinoma of skin, unspecified: Secondary | ICD-10-CM | POA: Insufficient documentation

## 2015-09-10 DIAGNOSIS — R739 Hyperglycemia, unspecified: Secondary | ICD-10-CM | POA: Insufficient documentation

## 2015-09-10 DIAGNOSIS — J309 Allergic rhinitis, unspecified: Secondary | ICD-10-CM | POA: Insufficient documentation

## 2015-09-10 DIAGNOSIS — N529 Male erectile dysfunction, unspecified: Secondary | ICD-10-CM | POA: Insufficient documentation

## 2015-09-10 DIAGNOSIS — R001 Bradycardia, unspecified: Secondary | ICD-10-CM | POA: Insufficient documentation

## 2015-09-17 ENCOUNTER — Ambulatory Visit (INDEPENDENT_AMBULATORY_CARE_PROVIDER_SITE_OTHER): Payer: Medicare Other

## 2015-09-17 DIAGNOSIS — I4891 Unspecified atrial fibrillation: Secondary | ICD-10-CM | POA: Diagnosis not present

## 2015-09-17 LAB — POCT INR
INR: 2.6
PT: 31.3

## 2015-09-17 NOTE — Patient Instructions (Signed)
.   Anticoagulation Dose Instructions as of 09/17/2015      Dorene Grebe Tue Wed Thu Fri Sat   New Dose 2.5 mg 1.25 mg 2.5 mg 2.5 mg 1.25 mg 2.5 mg 2.5 mg    Description        Continue current dose of Coumadin 2.5mg  daily (one tablet) and 1.25 mg Monday and Thursday (half a tablet). Return in 4 weeks.

## 2015-10-06 ENCOUNTER — Ambulatory Visit (INDEPENDENT_AMBULATORY_CARE_PROVIDER_SITE_OTHER): Payer: Medicare Other | Admitting: Family Medicine

## 2015-10-06 ENCOUNTER — Encounter: Payer: Self-pay | Admitting: Family Medicine

## 2015-10-06 VITALS — BP 120/70 | HR 80 | Temp 97.0°F | Resp 16 | Ht 73.0 in | Wt 193.0 lb

## 2015-10-06 DIAGNOSIS — I10 Essential (primary) hypertension: Secondary | ICD-10-CM

## 2015-10-06 DIAGNOSIS — R739 Hyperglycemia, unspecified: Secondary | ICD-10-CM | POA: Diagnosis not present

## 2015-10-06 DIAGNOSIS — E039 Hypothyroidism, unspecified: Secondary | ICD-10-CM

## 2015-10-06 DIAGNOSIS — E038 Other specified hypothyroidism: Secondary | ICD-10-CM

## 2015-10-06 DIAGNOSIS — I4891 Unspecified atrial fibrillation: Secondary | ICD-10-CM

## 2015-10-06 DIAGNOSIS — Z0000 Encounter for general adult medical examination without abnormal findings: Secondary | ICD-10-CM

## 2015-10-06 DIAGNOSIS — E785 Hyperlipidemia, unspecified: Secondary | ICD-10-CM | POA: Diagnosis not present

## 2015-10-06 NOTE — Progress Notes (Signed)
Patient ID: Joseph Barton, male   DOB: 10-12-1926, 82 y.o.   MRN: [id number]       Patient: Joseph Barton, Male    DOB: 03/05/1927, 82 y.o.   MRN: [id number] Visit Date: 10/06/2015  Today's Provider: Margarita Rana, MD   Chief Complaint  Patient presents with  . Medicare Wellness   Subjective:    Annual wellness visit Joseph Barton is a 82 y.o. male. He feels well. He reports exercising active with daily activies. He reports he is sleeping well.  06/20/13 CPE  -----------------------------------------------------------   Review of Systems  Constitutional: Negative.   HENT: Negative.   Eyes: Negative.   Respiratory: Negative.   Cardiovascular: Negative.   Gastrointestinal: Negative.   Endocrine: Negative.   Genitourinary: Negative.   Musculoskeletal: Positive for back pain.  Skin: Negative.   Allergic/Immunologic: Negative.   Neurological: Negative.   Hematological: Negative.   Psychiatric/Behavioral: Negative.     Social History   Social History  . Marital Status: Married    Spouse Name: N/A  . Number of Children: 1  . Years of Education: N/A   Occupational History  . Not on file.   Social History Main Topics  . Smoking status: Former Smoker    Quit date: 12/01/1987  . Smokeless tobacco: Never Used  . Alcohol Use: No  . Drug Use: No  . Sexual Activity: Not on file   Other Topics Concern  . Not on file   Social History Narrative    Past Medical History  Diagnosis Date  . Ischemic cardiomyopathy   . Atrial fibrillation, chronic ([place])   . Hyperlipidemia   . Myocardial infarction [facility]) 01-25-2004    Status post anterior myocardial infarction  . Abdominal aortic aneurysm ([place]) 01-2004    4.7 x 4.7 cm.  . Pulmonary nodules     Noted on abdominal CT     Patient Active Problem List   Diagnosis Date Noted  . Subclinical hypothyroidism 10/06/2015  . Allergic rhinitis 09/10/2015  . Bradycardia 09/10/2015  . Failure of erection 09/10/2015  . Blood glucose  elevated 09/10/2015  . BP (high blood pressure) 09/10/2015  . Neuropathy ([place]) 09/10/2015  . Apnea, sleep 09/10/2015  . Cancer of skin, squamous cell 09/10/2015  . Acid reflux 04/28/2015  . Arthritis, degenerative 12/25/2014  . Cardiomyopathy, ischemic 09/27/2013  . HLD (hyperlipidemia) 09/27/2013  . Lung nodule, multiple 09/27/2013  . Drug intolerance 09/27/2013  . Ischemic cardiomyopathy 12/01/2011  . Atrial fibrillation ([place]) 12/01/2011  . Chronic systolic heart failure ([place]) 12/01/2011  . IVCD (intraventricular conduction defect) 12/01/2011  . Intraventricular block 12/01/2011  . Myocardial ischemia 12/01/2011  . 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 CAD S/P percutaneous coronary angioplasty 01/25/2004  . Heart attack ([place]) 01/25/2004  . Myocardial infarction ([place]) 01/25/2004  . AAA (abdominal aortic aneurysm) ([place]) 01/01/2004    Past Surgical History  Procedure Laterality Date  . Cypher stents to lad      01/25/2004  . Abdominal aortic aneurysm repair      12/02/2007 [facility]  . Skin surgery  2016    [facility] skin cancer    His family history includes Diabetes in his father and sister; Heart attack in his brother; Heart attack (age of onset: 52) in his father; Heart disease in his brother and sister.    Previous Medications   BIOFLAVONOID PRODUCTS (BIOFLEX PO)    Take 1 tablet by  mouth daily.    CETIRIZINE HCL (ZYRTEC ALLERGY) 10 MG CAPS    Take 1 capsule by mouth daily.    DIGOXIN (LANOXIN) 0.125 MG TABLET    Take 1 tablet (0.125 mg total) by mouth daily.   FUROSEMIDE (LASIX) 20 MG TABLET    Take 20 mg by mouth daily.    GABAPENTIN (NEURONTIN) 100 MG CAPSULE    Take by mouth. Reported on 10/06/2015   LISINOPRIL (PRINIVIL,ZESTRIL) 2.5 MG TABLET    Take 2.5 mg by mouth daily.    METOPROLOL SUCCINATE (TOPROL-XL) 25 MG 24 HR TABLET    Take 25 mg by mouth daily.   NIACIN (NIASPAN) 500 MG  CR TABLET    Take 500 mg by mouth at bedtime.    OMEGA-3 FATTY ACIDS (FISH OIL) 1200 MG CAPS    Take 1,200 mg by mouth daily.   OMEPRAZOLE (PRILOSEC) 20 MG CAPSULE    Take 1 capsule (20 mg total) by mouth daily.   WARFARIN (COUMADIN) 2.5 MG TABLET    Take 1 tablet (2.5 mg total) by mouth as directed.    Patient Care Team: Margarita Rana, MD as PCP - General (Family Medicine)     Objective:   Vitals: BP 120/70 mmHg  Pulse 80  Temp(Src) 97 F (36.1 C) (Oral)  Resp 16  Ht 6\' 1"  (1.854 m)  Wt 193 lb (87.544 kg)  BMI 25.47 kg/m2  Physical Exam  Constitutional: He is oriented to person, place, and time. He appears well-developed and well-nourished.  HENT:  Head: Normocephalic and atraumatic.  Right Ear: External ear normal.  Left Ear: External ear normal.  Nose: Nose normal.  Mouth/Throat: Oropharynx is clear and moist.  Eyes: Conjunctivae and EOM are normal. Pupils are equal, round, and reactive to light.  Neck: Normal range of motion. Neck supple.  Cardiovascular: Normal rate, normal heart sounds and intact distal pulses.  An irregularly irregular rhythm present.  Pulmonary/Chest: Effort normal and breath sounds normal.  Abdominal: Soft. Bowel sounds are normal.  Musculoskeletal: Normal range of motion.  Neurological: He is alert and oriented to person, place, and time. He has normal reflexes.  Skin: Skin is warm and dry.  Psychiatric: He has a normal mood and affect. His behavior is normal. Judgment and thought content normal.    Activities of Daily Living In your present state of health, do you have any difficulty performing the following activities: 10/06/2015  Hearing? N  Vision? N  Difficulty concentrating or making decisions? N  Walking or climbing stairs? N  Dressing or bathing? N  Doing errands, shopping? N    Fall Risk Assessment Fall Risk  10/06/2015  Falls in the past year? No     Depression Screen PHQ 2/9 Scores 10/06/2015  PHQ - 2 Score 0    Cognitive  Testing - 6-CIT  Correct? Score   What year is it? yes 0 0 or 4  What month is it? yes 0 0 or 3  Memorize:    Pia Mau,  44,  [place],      What time is it? (within 1 hour) yes 0 0 or 3  Count backwards from 20 yes 0 0, 2, or 4  Name the months of the year yes 0 0, 2, or 4  Repeat name & address above no 3 0, 2, 4, 6, 8, or 10       TOTAL SCORE  3/28   Interpretation:  Normal  Normal (0-7) Abnormal (  8-28)       Assessment & Plan:     Annual Wellness Visit  Reviewed patient's Family Medical History Reviewed and updated list of patient's medical providers Assessment of cognitive impairment was done Assessed patient's functional ability Established a written schedule for health screening [place] Completed and Reviewed  Exercise Activities and Dietary recommendations Goals    None      Immunization History  Administered Date(s) Administered  . Influenza,inj,Quad PF,36+ Mos 05/14/2015  . Pneumococcal Conjugate-13 03/20/2014       1. Medicare annual wellness visit, subsequent Stable. Patient advised to continue eating healthy and exercise daily.  2. Essential hypertension F/U pending lab report. - CBC with Differential/Platelet - Comprehensive metabolic panel  3. Blood glucose elevated - Hemoglobin A1c  4. HLD (hyperlipidemia) - Lipid Panel With LDL/HDL Ratio  5. Subclinical hypothyroidism - T4 AND TSH  6. Atrial fibrillation, unspecified type ([place]) - Digoxin level     Patient seen and examined by Dr. Jerrell Belfast, and note scribed by Philbert Riser. Dimas, CMA.  I have reviewed the document for accuracy and completeness and I agree with above. Jerrell Belfast, MD   Margarita Rana, MD   ------------------------------------------------------------------------------------------------------------

## 2015-10-11 DIAGNOSIS — I4891 Unspecified atrial fibrillation: Secondary | ICD-10-CM | POA: Diagnosis not present

## 2015-10-11 DIAGNOSIS — I10 Essential (primary) hypertension: Secondary | ICD-10-CM | POA: Diagnosis not present

## 2015-10-11 DIAGNOSIS — E785 Hyperlipidemia, unspecified: Secondary | ICD-10-CM | POA: Diagnosis not present

## 2015-10-11 DIAGNOSIS — R739 Hyperglycemia, unspecified: Secondary | ICD-10-CM | POA: Diagnosis not present

## 2015-10-11 DIAGNOSIS — E038 Other specified hypothyroidism: Secondary | ICD-10-CM | POA: Diagnosis not present

## 2015-10-12 LAB — CBC WITH DIFFERENTIAL/PLATELET
BASOS: 1 %
Basophils Absolute: 0 10*3/uL (ref 0.0–0.2)
EOS (ABSOLUTE): 0.2 10*3/uL (ref 0.0–0.4)
EOS: 3 %
HEMOGLOBIN: 14.3 g/dL (ref 12.6–17.7)
Hematocrit: 43 % (ref 37.5–51.0)
IMMATURE GRANS (ABS): 0 10*3/uL (ref 0.0–0.1)
IMMATURE GRANULOCYTES: 0 %
LYMPHS: 28 %
Lymphocytes Absolute: 1.5 10*3/uL (ref 0.7–3.1)
MCH: 30.8 pg (ref 26.6–33.0)
MCHC: 33.3 g/dL (ref 31.5–35.7)
MCV: 93 fL (ref 79–97)
MONOCYTES: 9 %
Monocytes Absolute: 0.5 10*3/uL (ref 0.1–0.9)
NEUTROS ABS: 3.2 10*3/uL (ref 1.4–7.0)
NEUTROS PCT: 59 %
PLATELETS: 150 10*3/uL (ref 150–379)
RBC: 4.65 x10E6/uL (ref 4.14–5.80)
RDW: 14.4 % (ref 12.3–15.4)
WBC: 5.5 10*3/uL (ref 3.4–10.8)

## 2015-10-12 LAB — COMPREHENSIVE METABOLIC PANEL
A/G RATIO: 1.3 (ref 1.2–2.2)
ALBUMIN: 4 g/dL (ref 3.5–4.7)
ALT: 14 IU/L (ref 0–44)
AST: 20 IU/L (ref 0–40)
Alkaline Phosphatase: 63 IU/L (ref 39–117)
BUN / CREAT RATIO: 21 (ref 10–24)
BUN: 23 mg/dL (ref 8–27)
Bilirubin Total: 0.9 mg/dL (ref 0.0–1.2)
CALCIUM: 9.4 mg/dL (ref 8.6–10.2)
CO2: 21 mmol/L (ref 18–29)
Chloride: 100 mmol/L (ref 96–106)
Creatinine, Ser: 1.07 mg/dL (ref 0.76–1.27)
GFR, EST AFRICAN AMERICAN: 72 mL/min/{1.73_m2} (ref >59)
GFR, EST NON AFRICAN AMERICAN: 62 mL/min/{1.73_m2} (ref >59)
Globulin, Total: 3.1 g/dL (ref 1.5–4.5)
Glucose: 111 mg/dL — ABNORMAL HIGH (ref 65–99)
POTASSIUM: 4.9 mmol/L (ref 3.5–5.2)
Sodium: 142 mmol/L (ref 134–144)
TOTAL PROTEIN: 7.1 g/dL (ref 6.0–8.5)

## 2015-10-12 LAB — T4 AND TSH
T4, Total: 4.1 ug/dL — ABNORMAL LOW (ref 4.5–12.0)
TSH: 4.62 u[IU]/mL — ABNORMAL HIGH (ref 0.450–4.500)

## 2015-10-12 LAB — LIPID PANEL WITH LDL/HDL RATIO
CHOLESTEROL TOTAL: 229 mg/dL — AB (ref 100–199)
HDL: 33 mg/dL — ABNORMAL LOW (ref >39)
LDL CALC: 161 mg/dL — AB (ref 0–99)
LDL/HDL RATIO: 4.9 ratio — AB (ref 0.0–3.6)
TRIGLYCERIDES: 176 mg/dL — AB (ref 0–149)
VLDL CHOLESTEROL CAL: 35 mg/dL (ref 5–40)

## 2015-10-12 LAB — HEMOGLOBIN A1C
Est. average glucose Bld gHb Est-mCnc: 131 mg/dL
Hgb A1c MFr Bld: 6.2 % — ABNORMAL HIGH (ref 4.8–5.6)

## 2015-10-12 LAB — DIGOXIN LEVEL: Digoxin, Serum: 0.9 ng/mL (ref 0.5–0.9)

## 2015-10-15 ENCOUNTER — Ambulatory Visit (INDEPENDENT_AMBULATORY_CARE_PROVIDER_SITE_OTHER): Payer: Medicare Other

## 2015-10-15 DIAGNOSIS — I4891 Unspecified atrial fibrillation: Secondary | ICD-10-CM

## 2015-10-15 LAB — POCT INR
INR: 2.5
PT: 29.7

## 2015-10-15 NOTE — Patient Instructions (Signed)
Anticoagulation Dose Instructions as of 10/15/2015      Joseph Barton Tue Wed Thu Fri Sat   New Dose 2.5 mg 1.25 mg 2.5 mg 2.5 mg 1.25 mg 2.5 mg 2.5 mg    Description        2.5mg  daily (one tablet) and 1.25 mg Monday and Thursday (half a tablet). Return in 4 weeks.

## 2015-11-10 DIAGNOSIS — Z85828 Personal history of other malignant neoplasm of skin: Secondary | ICD-10-CM | POA: Diagnosis not present

## 2015-11-10 DIAGNOSIS — R21 Rash and other nonspecific skin eruption: Secondary | ICD-10-CM | POA: Diagnosis not present

## 2015-11-12 ENCOUNTER — Ambulatory Visit (INDEPENDENT_AMBULATORY_CARE_PROVIDER_SITE_OTHER): Payer: Medicare Other

## 2015-11-12 DIAGNOSIS — I4891 Unspecified atrial fibrillation: Secondary | ICD-10-CM | POA: Diagnosis not present

## 2015-11-12 LAB — POCT INR
INR: 2.2
PT: 26.5

## 2015-11-12 NOTE — Patient Instructions (Signed)
Anticoagulation Dose Instructions as of 11/12/2015      Joseph Barton Tue Wed Thu Fri Sat   New Dose 2.5 mg 1.25 mg 2.5 mg 2.5 mg 1.25 mg 2.5 mg 2.5 mg    Description        2.5mg  daily (one tablet) and 1.25 mg Monday and Thursday (half a tablet). Return in 4 weeks.

## 2015-12-10 ENCOUNTER — Ambulatory Visit (INDEPENDENT_AMBULATORY_CARE_PROVIDER_SITE_OTHER): Payer: Medicare Other

## 2015-12-10 DIAGNOSIS — I4891 Unspecified atrial fibrillation: Secondary | ICD-10-CM

## 2015-12-10 LAB — POCT INR
INR: 2.7
PT: 32.3

## 2015-12-10 NOTE — Patient Instructions (Signed)
Anticoagulation Dose Instructions as of 12/10/2015      Joseph Barton Tue Wed Thu Fri Sat   New Dose 2.5 mg 1.25 mg 2.5 mg 2.5 mg 1.25 mg 2.5 mg 2.5 mg    Description        2.5mg  daily (one tablet) and 1.25 mg Monday and Thursday (half a tablet). Return in 4 weeks.

## 2015-12-21 DIAGNOSIS — H903 Sensorineural hearing loss, bilateral: Secondary | ICD-10-CM | POA: Diagnosis not present

## 2015-12-21 DIAGNOSIS — H6123 Impacted cerumen, bilateral: Secondary | ICD-10-CM | POA: Diagnosis not present

## 2015-12-28 ENCOUNTER — Other Ambulatory Visit: Payer: Self-pay | Admitting: Family Medicine

## 2015-12-28 DIAGNOSIS — K219 Gastro-esophageal reflux disease without esophagitis: Secondary | ICD-10-CM

## 2016-01-07 ENCOUNTER — Ambulatory Visit (INDEPENDENT_AMBULATORY_CARE_PROVIDER_SITE_OTHER): Payer: Medicare Other

## 2016-01-07 DIAGNOSIS — I482 Chronic atrial fibrillation, unspecified: Secondary | ICD-10-CM

## 2016-01-07 LAB — POCT INR
INR: 2.4
PT: 29

## 2016-01-07 NOTE — Patient Instructions (Signed)
Anticoagulation Dose Instructions as of 01/07/2016      Joseph Barton Tue Wed Thu Fri Sat   New Dose 2.5 mg 1.25 mg 2.5 mg 2.5 mg 1.25 mg 2.5 mg 2.5 mg    Description        2.5mg  daily (one tablet) and 1.25 mg Monday and Thursday (half a tablet). Return in 4 weeks.

## 2016-02-04 ENCOUNTER — Ambulatory Visit (INDEPENDENT_AMBULATORY_CARE_PROVIDER_SITE_OTHER): Payer: Medicare Other

## 2016-02-04 DIAGNOSIS — I482 Chronic atrial fibrillation, unspecified: Secondary | ICD-10-CM

## 2016-02-04 LAB — POCT INR
INR: 2.5
PT: 30.5

## 2016-02-04 NOTE — Patient Instructions (Signed)
Anticoagulation Dose Instructions as of 02/04/2016      Dorene Grebe Tue Wed Thu Fri Sat   New Dose 2.5 mg 1.25 mg 2.5 mg 2.5 mg 1.25 mg 2.5 mg 2.5 mg    Description   2.5mg  daily (one tablet) and 1.25 mg Monday and Thursday (half a tablet). Return in 4 weeks.

## 2016-03-03 ENCOUNTER — Ambulatory Visit (INDEPENDENT_AMBULATORY_CARE_PROVIDER_SITE_OTHER): Payer: Medicare Other | Admitting: Emergency Medicine

## 2016-03-03 DIAGNOSIS — I482 Chronic atrial fibrillation, unspecified: Secondary | ICD-10-CM

## 2016-03-03 LAB — POCT INR
INR: 2.8
PT: 34.1

## 2016-03-03 NOTE — Patient Instructions (Signed)
Anticoagulation Dose Instructions as of 03/03/2016      Joseph Barton Tue Wed Thu Fri Sat   New Dose 2.5 mg 1.25 mg 2.5 mg 2.5 mg 1.25 mg 2.5 mg 2.5 mg    Description   2.5mg  daily (one tablet) and 1.25 mg Monday and Thursday (half a tablet). Return in 4 weeks.

## 2016-03-13 ENCOUNTER — Other Ambulatory Visit: Payer: Self-pay

## 2016-03-13 DIAGNOSIS — I4891 Unspecified atrial fibrillation: Secondary | ICD-10-CM

## 2016-03-13 MED ORDER — WARFARIN SODIUM 2.5 MG PO TABS: 2.5000 mg | ORAL_TABLET | ORAL | 1 refills | Status: DC

## 2016-04-07 ENCOUNTER — Ambulatory Visit: Payer: Self-pay

## 2016-04-14 ENCOUNTER — Ambulatory Visit (INDEPENDENT_AMBULATORY_CARE_PROVIDER_SITE_OTHER): Payer: Medicare Other

## 2016-04-14 DIAGNOSIS — I482 Chronic atrial fibrillation, unspecified: Secondary | ICD-10-CM

## 2016-04-14 DIAGNOSIS — Z23 Encounter for immunization: Secondary | ICD-10-CM

## 2016-04-14 LAB — POCT INR
INR: 1.9
PT: 23

## 2016-04-14 NOTE — Patient Instructions (Signed)
Anticoagulation Dose Instructions as of 04/14/2016      Dorene Grebe Tue Wed Thu Fri Sat   New Dose 2.5 mg 1.25 mg 2.5 mg 2.5 mg 1.25 mg 2.5 mg 2.5 mg    Description   Keep dose the same since you ate greens yesterday. 2.5mg  daily (one tablet) and 1.25 mg Monday and Thursday (half a tablet). Recheck 3 weeks.

## 2016-05-05 ENCOUNTER — Ambulatory Visit (INDEPENDENT_AMBULATORY_CARE_PROVIDER_SITE_OTHER): Payer: Medicare Other

## 2016-05-05 DIAGNOSIS — I482 Chronic atrial fibrillation, unspecified: Secondary | ICD-10-CM

## 2016-05-05 LAB — POCT INR
INR: 2.4
PT: 29.1

## 2016-05-05 NOTE — Patient Instructions (Signed)
Anticoagulation Dose Instructions as of 05/05/2016      Dorene Grebe Tue Wed Thu Fri Sat   New Dose 2.5 mg 1.25 mg 2.5 mg 2.5 mg 1.25 mg 2.5 mg 2.5 mg    Description   continue 2.5mg  daily (one tablet) and 1.25 mg Monday and Thursday (half a tablet). Recheck 4 weeks.

## 2016-06-05 ENCOUNTER — Encounter: Payer: Self-pay | Admitting: Family Medicine

## 2016-06-05 ENCOUNTER — Ambulatory Visit (INDEPENDENT_AMBULATORY_CARE_PROVIDER_SITE_OTHER): Payer: Medicare Other | Admitting: Family Medicine

## 2016-06-05 VITALS — BP 128/70 | HR 72 | Temp 97.5°F | Resp 14 | Wt 200.0 lb

## 2016-06-05 DIAGNOSIS — I482 Chronic atrial fibrillation, unspecified: Secondary | ICD-10-CM

## 2016-06-05 DIAGNOSIS — E7849 Other hyperlipidemia: Secondary | ICD-10-CM

## 2016-06-05 DIAGNOSIS — R739 Hyperglycemia, unspecified: Secondary | ICD-10-CM | POA: Diagnosis not present

## 2016-06-05 DIAGNOSIS — I739 Peripheral vascular disease, unspecified: Secondary | ICD-10-CM | POA: Diagnosis not present

## 2016-06-05 DIAGNOSIS — E784 Other hyperlipidemia: Secondary | ICD-10-CM | POA: Diagnosis not present

## 2016-06-05 DIAGNOSIS — I10 Essential (primary) hypertension: Secondary | ICD-10-CM

## 2016-06-05 LAB — POCT INR
INR: 3.4
PT: 41.1

## 2016-06-05 LAB — POCT GLYCOSYLATED HEMOGLOBIN (HGB A1C): Hemoglobin A1C: 5.8

## 2016-06-05 MED ORDER — WARFARIN SODIUM 2 MG PO TABS: 2.0000 mg | ORAL_TABLET | Freq: Every day | ORAL | 3 refills | Status: DC

## 2016-06-05 NOTE — Patient Instructions (Signed)
Anticoagulation Dose Instructions as of 06/05/2016      Joseph Barton Tue Wed Thu Fri Sat   New Dose 2 mg 2 mg 2 mg 2 mg 2 mg 2 mg 2 mg    Description   Take 2 mg daily and re check in 1 week

## 2016-06-05 NOTE — Progress Notes (Signed)
Joseph Barton  MRN: [id number] DOB: May 01, 1927  Subjective:  HPI  Patient is here for follow up. Use to see dr Venia Minks. He does not check his sugar or blood pressure. Denies cardiac symptoms. He has some numbness in his feet but it is a chronic issue. He is doing pretty well except for patient and his wife mentioning 2 things. ABdominal pain and diarrhea at times, patient states seldom. No pattern to any certain foods. Does not seem to be concerned about this. Left lower leg pain started last week and notices with walking a lot. Describes it as a muscle tightness. No injury to the area that he knows of. He takes Omeprazole daily. Wife states in the past he stopped taking this medication for about 2 to 3 days and heartburn returned.  For coumadin he takes 2.5 mg daily except 1/2 tablet of that on Monday and Thursday. Last INR on 05/05/16 was 2.4 and today 3.4.  BP Readings from Last 3 Encounters:  06/05/16 128/70  10/06/15 120/70  10/09/14 118/64   Wt Readings from Last 3 Encounters:  06/05/16 200 lb (90.7 kg)  10/06/15 193 lb (87.5 kg)  10/09/14 193 lb (87.5 kg)   Patient Active Problem List   Diagnosis Date Noted  . Subclinical hypothyroidism 10/06/2015  . Allergic rhinitis 09/10/2015  . Bradycardia 09/10/2015  . Failure of erection 09/10/2015  . Blood glucose elevated 09/10/2015  . BP (high blood pressure) 09/10/2015  . Neuropathy ([place]) 09/10/2015  . Apnea, sleep 09/10/2015  . Cancer of skin, squamous cell 09/10/2015  . Acid reflux 04/28/2015  . Arthritis, degenerative 12/25/2014  . Cardiomyopathy, ischemic 09/27/2013  . HLD (hyperlipidemia) 09/27/2013  . Lung nodule, multiple 09/27/2013  . Drug intolerance 09/27/2013  . Ischemic cardiomyopathy 12/01/2011  . Atrial fibrillation ([place]) 12/01/2011  . Chronic systolic heart failure ([place]) 12/01/2011  . IVCD (intraventricular conduction defect) 12/01/2011  . Intraventricular block 12/01/2011  . Myocardial ischemia  12/01/2011  . Complications, mechanical, graft, aortic ([place]) 12/02/2007  . Mechanical complication of aortic graft ([place]) 12/02/2007  . CAD S/P percutaneous coronary angioplasty 01/25/2004  . Heart attack 01/25/2004  . Myocardial infarction 01/25/2004  . AAA (abdominal aortic aneurysm) ([place]) 01/01/2004    Past Medical History:  Diagnosis Date  . Abdominal aortic aneurysm ([place]) 01-2004   4.7 x 4.7 cm.  . Atrial fibrillation, chronic ([place])   . Hyperlipidemia   . Ischemic cardiomyopathy   . Myocardial infarction 01-25-2004   Status post anterior myocardial infarction  . Pulmonary nodules    Noted on abdominal CT    Social History   Social History  . Marital status: Married    Spouse name: N/A  . Number of children: 1  . Years of education: N/A   Occupational History  . Not on file.   Social History Main Topics  . Smoking status: Former Smoker    Quit date: 12/01/1987  . Smokeless tobacco: Never Used  . Alcohol use No  . Drug use: No  . Sexual activity: Not on file   Other Topics Concern  . Not on file   Social History Narrative  . No narrative on file    Outpatient Encounter Prescriptions as of 06/05/2016  Medication Sig Note  . Bioflavonoid Products (BIOFLEX PO) Take 1 tablet by mouth daily.    . Cetirizine HCl (ZYRTEC ALLERGY) 10 MG CAPS Take 1 capsule by mouth daily.  09/10/2015: Received from: [facility]  . digoxin (LANOXIN) 0.125 MG tablet Take  1 tablet (0.125 mg total) by mouth daily.   . furosemide (LASIX) 20 MG tablet Take 20 mg by mouth daily.    Marland Kitchen gabapentin (NEURONTIN) 100 MG capsule Take by mouth. Reported on 10/06/2015 09/10/2015: Medication taken as needed.  Received from: [facility]  . lisinopril (PRINIVIL,ZESTRIL) 2.5 MG tablet Take 2.5 mg by mouth daily.  12/25/2014: Received from: [facility]  . metoprolol succinate (TOPROL-XL) 25 MG 24 hr tablet Take 25 mg by mouth daily.   . niacin (NIASPAN) 500  MG CR tablet Take 500 mg by mouth at bedtime.  12/25/2014: Received from: [facility]  . Omega-3 Fatty Acids (FISH OIL) 1200 MG CAPS Take 1,200 mg by mouth daily.   Marland Kitchen omeprazole (PRILOSEC) 20 MG capsule Take 1 capsule by mouth  daily   . warfarin (COUMADIN) 2.5 MG tablet Take 1 tablet (2.5 mg total) by mouth as directed.    No facility-administered encounter medications on file as of 06/05/2016.     Allergies  Allergen Reactions  . Atorvastatin   . Simvastatin     Review of Systems  Constitutional: Negative.   Eyes: Negative.   Respiratory: Negative.   Cardiovascular: Negative.   Gastrointestinal: Positive for abdominal pain and diarrhea.  Musculoskeletal: Positive for joint pain and myalgias.  Skin: Negative.   Neurological: Positive for tingling.  Endo/Heme/Allergies: Bruises/bleeds easily.    Objective:  BP 128/70   Pulse 72   Temp 97.5 F (36.4 C)   Resp 14   Wt 200 lb (90.7 kg)   BMI 26.39 kg/m   Physical Exam  Constitutional: He is oriented to person, place, and time and well-developed, well-nourished, and in no distress.  HENT:  Head: Normocephalic and atraumatic.  Right Ear: External ear normal.  Left Ear: External ear normal.  Nose: Nose normal.  Eyes: Conjunctivae are normal. Pupils are equal, round, and reactive to light.  Neck: Neck supple. No thyromegaly present.  Cardiovascular: Normal rate, regular rhythm, normal heart sounds and intact distal pulses.   No murmur heard. Pulmonary/Chest: Effort normal and breath sounds normal.  Abdominal: Soft. He exhibits no distension. There is no tenderness.  Musculoskeletal: He exhibits no edema or tenderness.  Claudication-pulses good  Neurological: He is alert and oriented to person, place, and time. No cranial nerve deficit. He exhibits normal muscle tone. Gait normal. Coordination normal.  Skin: Skin is warm and dry.  Psychiatric: Mood, memory, affect and judgment normal.    Assessment and  Plan :  1. Chronic atrial fibrillation (HCC) INR 3.4 today. Take 2 mg daily and re check in 1 week. - POCT INR - Digoxin level - CBC with Differential/Platelet - TSH  2. Blood glucose elevated/Prediabetes A1C 5.8 better. Follow. - POCT HgB A1C  3. Essential hypertension Stable. - CBC with Differential/Platelet - TSH  4. Other hyperlipidemia Re check levels today. - CBC with Differential/Platelet - Comprehensive metabolic panel - Lipid Panel With LDL/HDL Ratio 5.CAD/sp PCI 6.s/p Aortic Valve Graft 7.Ischemic Cardiomyopathy 8.AAA  Claudication Pulses are good on the exam today. Order ABI for patient to have done in a week or 2.  HPI, Exam and A&P transcribed under direction and in the presence of Miguel Aschoff, MD.

## 2016-06-06 LAB — LIPID PANEL WITH LDL/HDL RATIO
CHOLESTEROL TOTAL: 223 mg/dL — AB (ref 100–199)
HDL: 35 mg/dL — ABNORMAL LOW (ref >39)
LDL CALC: 154 mg/dL — AB (ref 0–99)
LDL/HDL RATIO: 4.4 ratio — AB (ref 0.0–3.6)
Triglycerides: 170 mg/dL — ABNORMAL HIGH (ref 0–149)
VLDL CHOLESTEROL CAL: 34 mg/dL (ref 5–40)

## 2016-06-06 LAB — CBC WITH DIFFERENTIAL/PLATELET
Basophils Absolute: 0 10*3/uL (ref 0.0–0.2)
Basos: 1 %
EOS (ABSOLUTE): 0.1 10*3/uL (ref 0.0–0.4)
EOS: 2 %
HEMATOCRIT: 43.2 % (ref 37.5–51.0)
HEMOGLOBIN: 14 g/dL (ref 13.0–17.7)
IMMATURE GRANS (ABS): 0 10*3/uL (ref 0.0–0.1)
Immature Granulocytes: 0 %
LYMPHS ABS: 1.8 10*3/uL (ref 0.7–3.1)
LYMPHS: 35 %
MCH: 30.7 pg (ref 26.6–33.0)
MCHC: 32.4 g/dL (ref 31.5–35.7)
MCV: 95 fL (ref 79–97)
MONOCYTES: 10 %
Monocytes Absolute: 0.5 10*3/uL (ref 0.1–0.9)
NEUTROS ABS: 2.7 10*3/uL (ref 1.4–7.0)
Neutrophils: 52 %
Platelets: 154 10*3/uL (ref 150–379)
RBC: 4.56 x10E6/uL (ref 4.14–5.80)
RDW: 14.5 % (ref 12.3–15.4)
WBC: 5.2 10*3/uL (ref 3.4–10.8)

## 2016-06-06 LAB — COMPREHENSIVE METABOLIC PANEL
ALBUMIN: 4.3 g/dL (ref 3.5–4.7)
ALK PHOS: 61 IU/L (ref 39–117)
ALT: 12 IU/L (ref 0–44)
AST: 23 IU/L (ref 0–40)
Albumin/Globulin Ratio: 1.3 (ref 1.2–2.2)
BUN / CREAT RATIO: 16 (ref 10–24)
BUN: 19 mg/dL (ref 8–27)
Bilirubin Total: 0.9 mg/dL (ref 0.0–1.2)
CO2: 24 mmol/L (ref 18–29)
CREATININE: 1.17 mg/dL (ref 0.76–1.27)
Calcium: 9.2 mg/dL (ref 8.6–10.2)
Chloride: 100 mmol/L (ref 96–106)
GFR, EST AFRICAN AMERICAN: 64 mL/min/{1.73_m2} (ref >59)
GFR, EST NON AFRICAN AMERICAN: 56 mL/min/{1.73_m2} — AB (ref >59)
GLOBULIN, TOTAL: 3.2 g/dL (ref 1.5–4.5)
GLUCOSE: 108 mg/dL — AB (ref 65–99)
Potassium: 4.4 mmol/L (ref 3.5–5.2)
SODIUM: 140 mmol/L (ref 134–144)
TOTAL PROTEIN: 7.5 g/dL (ref 6.0–8.5)

## 2016-06-06 LAB — TSH: TSH: 5.13 u[IU]/mL — AB (ref 0.450–4.500)

## 2016-06-06 LAB — DIGOXIN LEVEL: Digoxin, Serum: 1.3 ng/mL — ABNORMAL HIGH (ref 0.5–0.9)

## 2016-06-14 ENCOUNTER — Ambulatory Visit (INDEPENDENT_AMBULATORY_CARE_PROVIDER_SITE_OTHER): Payer: Medicare Other

## 2016-06-14 DIAGNOSIS — I482 Chronic atrial fibrillation, unspecified: Secondary | ICD-10-CM

## 2016-06-14 DIAGNOSIS — I739 Peripheral vascular disease, unspecified: Secondary | ICD-10-CM | POA: Diagnosis not present

## 2016-06-14 LAB — POCT ANKLE BRACHIAL INDEX ASSESSMENT (BFP)
Left ABI: 1.07
Right ABI: 1.26

## 2016-06-14 LAB — POCT INR
INR: 2.3
PT: 28.2

## 2016-06-14 NOTE — Patient Instructions (Signed)
Anticoagulation Dose Instructions as of 06/14/2016      Dorene Grebe Tue Wed Thu Fri Sat   New Dose 2 mg 2 mg 2 mg 2 mg 2 mg 2 mg 2 mg    Description   Take 2.5 mg daily and except 1.25 mg M and Th F/U in 2 week

## 2016-06-15 NOTE — Progress Notes (Signed)
Patient in for ABI

## 2016-06-28 ENCOUNTER — Ambulatory Visit (INDEPENDENT_AMBULATORY_CARE_PROVIDER_SITE_OTHER): Payer: Medicare Other

## 2016-06-28 DIAGNOSIS — I482 Chronic atrial fibrillation, unspecified: Secondary | ICD-10-CM

## 2016-06-28 LAB — POCT INR
INR: 2.3
PT: 28

## 2016-06-28 NOTE — Patient Instructions (Signed)
Anticoagulation Dose Instructions as of 06/28/2016      Joseph Barton Tue Wed Thu Fri Sat   New Dose 2.5 mg 1.25 mg 2.5 mg 2.5 mg 1.25 mg 2.5 mg 2.5 mg    Description   Take 2.5 mg daily and except 1.25 mg M and Th F/U in 2 week

## 2016-07-01 ENCOUNTER — Other Ambulatory Visit: Payer: Self-pay | Admitting: Family Medicine

## 2016-07-01 DIAGNOSIS — K219 Gastro-esophageal reflux disease without esophagitis: Secondary | ICD-10-CM

## 2016-07-28 ENCOUNTER — Ambulatory Visit (INDEPENDENT_AMBULATORY_CARE_PROVIDER_SITE_OTHER): Payer: Medicare Other

## 2016-07-28 DIAGNOSIS — I482 Chronic atrial fibrillation, unspecified: Secondary | ICD-10-CM

## 2016-07-28 LAB — POCT INR
INR: 2.6
PT: 30.8

## 2016-07-28 NOTE — Patient Instructions (Signed)
INR as of 07/28/2016 and Previous Dosing Information    INR Dt INR Goal Joseph Barton Sun Mon Joseph Barton Thu Fri Sat   07/28/2016 2.6 2.0-3.0 15 mg 2.5 mg 1.25 mg 2.5 mg 2.5 mg 1.25 mg 2.5 mg 2.5 mg    Previous description   Take 2.5 mg daily and except 1.25 mg M and Th F/U in 2 week   Anticoagulation Dose Instructions as of 07/28/2016      Total Sun Mon Tue Wed Thu Fri Sat   New Dose 15 mg 2.5 mg 1.25 mg 2.5 mg 2.5 mg 1.25 mg 2.5 mg 2.5 mg     (2.5 mg x 1)  (2.5 mg x 0.5)  (2.5 mg x 1)  (2.5 mg x 1)  (2.5 mg x 0.5)  (2.5 mg x 1)  (2.5 mg x 1)                         Description   Take 2.5 mg daily and except 1.25 mg M and Th

## 2016-08-08 ENCOUNTER — Other Ambulatory Visit: Payer: Self-pay | Admitting: Cardiology

## 2016-08-08 DIAGNOSIS — Z8679 Personal history of other diseases of the circulatory system: Secondary | ICD-10-CM

## 2016-08-08 DIAGNOSIS — I255 Ischemic cardiomyopathy: Secondary | ICD-10-CM | POA: Diagnosis not present

## 2016-08-08 DIAGNOSIS — I5022 Chronic systolic (congestive) heart failure: Secondary | ICD-10-CM | POA: Diagnosis not present

## 2016-08-08 DIAGNOSIS — I714 Abdominal aortic aneurysm, without rupture: Secondary | ICD-10-CM | POA: Diagnosis not present

## 2016-08-08 DIAGNOSIS — Z79899 Other long term (current) drug therapy: Secondary | ICD-10-CM | POA: Diagnosis not present

## 2016-08-08 DIAGNOSIS — I480 Paroxysmal atrial fibrillation: Secondary | ICD-10-CM | POA: Diagnosis not present

## 2016-08-08 DIAGNOSIS — I2510 Atherosclerotic heart disease of native coronary artery without angina pectoris: Secondary | ICD-10-CM | POA: Diagnosis not present

## 2016-08-08 DIAGNOSIS — Z9889 Other specified postprocedural states: Principal | ICD-10-CM

## 2016-08-10 ENCOUNTER — Ambulatory Visit: Payer: Medicare Other

## 2016-08-22 DIAGNOSIS — Z9861 Coronary angioplasty status: Secondary | ICD-10-CM | POA: Diagnosis not present

## 2016-08-22 DIAGNOSIS — I259 Chronic ischemic heart disease, unspecified: Secondary | ICD-10-CM | POA: Diagnosis not present

## 2016-08-22 DIAGNOSIS — I255 Ischemic cardiomyopathy: Secondary | ICD-10-CM | POA: Diagnosis not present

## 2016-08-22 DIAGNOSIS — I5022 Chronic systolic (congestive) heart failure: Secondary | ICD-10-CM | POA: Diagnosis not present

## 2016-08-22 DIAGNOSIS — I2510 Atherosclerotic heart disease of native coronary artery without angina pectoris: Secondary | ICD-10-CM | POA: Diagnosis not present

## 2016-08-22 DIAGNOSIS — I480 Paroxysmal atrial fibrillation: Secondary | ICD-10-CM | POA: Diagnosis not present

## 2016-08-25 ENCOUNTER — Ambulatory Visit (INDEPENDENT_AMBULATORY_CARE_PROVIDER_SITE_OTHER): Payer: Medicare Other

## 2016-08-25 DIAGNOSIS — I482 Chronic atrial fibrillation, unspecified: Secondary | ICD-10-CM

## 2016-08-25 LAB — POCT INR
INR: 2.1
PT: 25.5

## 2016-08-25 NOTE — Patient Instructions (Signed)
Anticoagulation Dose Instructions as of 08/25/2016      Joseph Barton Tue Wed Thu Fri Sat   New Dose 2.5 mg 1.25 mg 2.5 mg 2.5 mg 1.25 mg 2.5 mg 2.5 mg    Description   Take 2.5 mg daily and except 1.25 mg M and Th  F/U 4 weeks

## 2016-09-04 DIAGNOSIS — I714 Abdominal aortic aneurysm, without rupture: Secondary | ICD-10-CM | POA: Diagnosis not present

## 2016-09-04 DIAGNOSIS — I255 Ischemic cardiomyopathy: Secondary | ICD-10-CM | POA: Diagnosis not present

## 2016-09-04 DIAGNOSIS — I2510 Atherosclerotic heart disease of native coronary artery without angina pectoris: Secondary | ICD-10-CM | POA: Diagnosis not present

## 2016-09-04 DIAGNOSIS — I482 Chronic atrial fibrillation: Secondary | ICD-10-CM | POA: Diagnosis not present

## 2016-09-04 DIAGNOSIS — I5022 Chronic systolic (congestive) heart failure: Secondary | ICD-10-CM | POA: Diagnosis not present

## 2016-09-11 ENCOUNTER — Ambulatory Visit: Payer: Medicare Other | Admitting: Family Medicine

## 2016-09-22 ENCOUNTER — Ambulatory Visit (INDEPENDENT_AMBULATORY_CARE_PROVIDER_SITE_OTHER): Payer: Medicare Other

## 2016-09-22 DIAGNOSIS — I482 Chronic atrial fibrillation, unspecified: Secondary | ICD-10-CM

## 2016-09-22 DIAGNOSIS — T82330D Leakage of aortic (bifurcation) graft (replacement), subsequent encounter: Secondary | ICD-10-CM | POA: Diagnosis not present

## 2016-09-22 DIAGNOSIS — I213 ST elevation (STEMI) myocardial infarction of unspecified site: Secondary | ICD-10-CM | POA: Diagnosis not present

## 2016-09-22 DIAGNOSIS — R0602 Shortness of breath: Secondary | ICD-10-CM | POA: Diagnosis not present

## 2016-09-22 DIAGNOSIS — I259 Chronic ischemic heart disease, unspecified: Secondary | ICD-10-CM | POA: Diagnosis not present

## 2016-09-22 DIAGNOSIS — I5022 Chronic systolic (congestive) heart failure: Secondary | ICD-10-CM | POA: Diagnosis not present

## 2016-09-22 LAB — POCT INR
INR: 1.9
PT: 22.3

## 2016-09-22 NOTE — Patient Instructions (Signed)
Anticoagulation Dose Instructions as of 09/22/2016      Joseph Barton Tue Wed Thu Fri Sat   New Dose 2.5 mg 1.25 mg 2.5 mg 2.5 mg 2.5 mg 2.5 mg 2.5 mg    Description   Take 2.5 mg daily and except 1.25 mg Monday F/U 4 weeks

## 2016-09-28 DIAGNOSIS — I255 Ischemic cardiomyopathy: Secondary | ICD-10-CM | POA: Diagnosis not present

## 2016-09-28 DIAGNOSIS — I5022 Chronic systolic (congestive) heart failure: Secondary | ICD-10-CM | POA: Diagnosis not present

## 2016-09-28 DIAGNOSIS — I2510 Atherosclerotic heart disease of native coronary artery without angina pectoris: Secondary | ICD-10-CM | POA: Diagnosis not present

## 2016-09-28 DIAGNOSIS — T82330D Leakage of aortic (bifurcation) graft (replacement), subsequent encounter: Secondary | ICD-10-CM | POA: Diagnosis not present

## 2016-09-28 DIAGNOSIS — I213 ST elevation (STEMI) myocardial infarction of unspecified site: Secondary | ICD-10-CM | POA: Diagnosis not present

## 2016-10-20 ENCOUNTER — Ambulatory Visit (INDEPENDENT_AMBULATORY_CARE_PROVIDER_SITE_OTHER): Payer: Medicare Other

## 2016-10-20 DIAGNOSIS — I482 Chronic atrial fibrillation, unspecified: Secondary | ICD-10-CM

## 2016-10-20 LAB — POCT INR
INR: 2.4
PT: 29

## 2016-10-20 NOTE — Patient Instructions (Signed)
Anticoagulation Dose Instructions as of 10/20/2016      Joseph Barton Tue Wed Thu Fri Sat   New Dose 2.5 mg 1.25 mg 2.5 mg 2.5 mg 2.5 mg 2.5 mg 2.5 mg    Description   Take 2.5 mg daily and except 1.25 mg Monday F/U 4 weeks

## 2016-11-10 ENCOUNTER — Other Ambulatory Visit: Payer: Self-pay

## 2016-11-10 ENCOUNTER — Ambulatory Visit (INDEPENDENT_AMBULATORY_CARE_PROVIDER_SITE_OTHER): Payer: Medicare Other | Admitting: Emergency Medicine

## 2016-11-10 ENCOUNTER — Other Ambulatory Visit: Payer: Self-pay | Admitting: Family Medicine

## 2016-11-10 ENCOUNTER — Telehealth: Payer: Self-pay

## 2016-11-10 DIAGNOSIS — I4891 Unspecified atrial fibrillation: Secondary | ICD-10-CM

## 2016-11-10 DIAGNOSIS — I482 Chronic atrial fibrillation, unspecified: Secondary | ICD-10-CM

## 2016-11-10 LAB — POCT INR
INR: 3.4
PT: 41.4

## 2016-11-10 MED ORDER — WARFARIN SODIUM 2.5 MG PO TABS: 2.5000 mg | ORAL_TABLET | ORAL | 3 refills | Status: DC

## 2016-11-10 MED ORDER — WARFARIN SODIUM 2.5 MG PO TABS: 2.5000 mg | ORAL_TABLET | Freq: Every day | ORAL | 3 refills | Status: DC

## 2016-11-10 NOTE — Patient Instructions (Signed)
Anticoagulation Warfarin Dose Instructions as of 11/10/2016      Joseph Barton Tue Wed Thu Fri Sat   New Dose 2.5 mg 1.25 mg 2.5 mg 2.5 mg 2.5 mg 2.5 mg 2.5 mg    Description   Hold for 2 days then continue same dose.  2.5 mg daily and except 1.25 mg Monday F/U 4 weeks

## 2016-11-10 NOTE — Telephone Encounter (Signed)
already done-aa

## 2016-11-10 NOTE — Telephone Encounter (Signed)
Patients wife contacted office requesting refill on patients prescription, she request that it be sent to [email or web address]  warfarin (COUMADIN) 2.5 MG tablet

## 2016-11-10 NOTE — Telephone Encounter (Signed)
ok 

## 2016-11-21 ENCOUNTER — Ambulatory Visit (INDEPENDENT_AMBULATORY_CARE_PROVIDER_SITE_OTHER): Payer: Medicare Other | Admitting: Internal Medicine

## 2016-11-21 ENCOUNTER — Encounter: Payer: Self-pay | Admitting: Internal Medicine

## 2016-11-21 VITALS — BP 108/80 | HR 77 | Ht 73.5 in | Wt 197.8 lb

## 2016-11-21 DIAGNOSIS — I482 Chronic atrial fibrillation, unspecified: Secondary | ICD-10-CM

## 2016-11-21 DIAGNOSIS — I255 Ischemic cardiomyopathy: Secondary | ICD-10-CM

## 2016-11-21 DIAGNOSIS — I5022 Chronic systolic (congestive) heart failure: Secondary | ICD-10-CM

## 2016-11-21 NOTE — Progress Notes (Signed)
ELECTROPHYSIOLOGY Evaluation  NOTE  Patient ID: Joseph Barton, MRN: [id number], DOB/AGE: January 27, 1927 83 y.o. Admit date: (Not on file) Date of Consult: 11/21/2016  Primary Physician: Jerrol Banana., MD Primary Cardiologist: [facility]   Joseph Barton is being seen today At his own request  HPI Joseph Barton is a 83 y.o. male  Seen to establish cardiac care  He has history of ischemic heart disease and prior stenting and MI 2005 , permanent atrial fibrillation.  He has had long-standing mitral regurgitation.  Over recent months he is wife that he had noted increasing dyspnea on exertion associated with peripheral edema but no chest pain or nocturnal dyspnea. He was seen by Dr. Kermit Balo as well as his PA the latter. We'll increase his diuretics for a couple of days. This was associated with improved dyspnea and decreased weight. Unfortunately, the diuretics were resumed at a lower dose and the symptoms have recurred. The family is quite frustrated.  No other medication changes have been noted.   He underwent Myoview scanning April 2013 demonstrating an ejection fraction of 13%. He has been on beta blockers and ACE inhibitors for a long time. Echocardiogram confirmed depressed left ventricular function moderate MR and TR and there was no evidence of ischemia on his Myoview Echo 2018 EF 35% ([facility]) with mod-severe MR.    Date Cr Hgb       3/18 1.4 15.2     Past Medical History:  Diagnosis Date  . Abdominal aortic aneurysm ([place]) 01-2004   4.7 x 4.7 cm.  . Atrial fibrillation, chronic ([place])   . Chronic systolic heart failure ([place])   . Coronary artery disease 01/25/2004   Cypher stent LAD  . Hyperlipidemia   . Ischemic cardiomyopathy   . Myocardial infarction [facility]) 01-25-2004   Status post anterior myocardial infarction  . Pulmonary nodules    Noted on abdominal CT  . Statin intolerance       Surgical History:  Past Surgical History:  Procedure Laterality Date  . ABDOMINAL  AORTIC ANEURYSM REPAIR     12/02/2007 [facility]  . ABDOMINAL AORTIC ANEURYSM REPAIR  2006   [facility]   . CARDIAC CATHETERIZATION  2009   [facility]  . CORONARY ANGIOPLASTY WITH STENT PLACEMENT  2005   Cypher stent LAD   . Cypher stents to LAD     01/25/2004  . SKIN SURGERY  2016   [facility] skin cancer     Home Meds: Prior to Admission medications   Medication Sig Start Date End Date Taking? Authorizing Provider  Bioflavonoid Products (BIOFLEX PO) Take 1 tablet by mouth daily.     [provider]  Cetirizine HCl (ZYRTEC ALLERGY) 10 MG CAPS Take 1 capsule by mouth daily.     [provider]  digoxin (LANOXIN) 0.125 MG tablet Take 1 tablet (0.125 mg total) by mouth daily. 08/09/15 08/08/16  Margarita Rana, MD  furosemide (LASIX) 20 MG tablet Take 20 mg by mouth daily.  09/30/11   [provider]  gabapentin (NEURONTIN) 100 MG capsule Take by mouth. Reported on 10/06/2015 12/25/13   [provider]  lisinopril (PRINIVIL,ZESTRIL) 2.5 MG tablet Take 2.5 mg by mouth daily.  01/20/14   [provider]  metoprolol succinate (TOPROL-XL) 25 MG 24 hr tablet Take 25 mg by mouth daily.    [provider]  niacin (NIASPAN) 500 MG CR tablet Take 500 mg by mouth at bedtime.     [provider]  Omega-3 Fatty Acids (FISH OIL) 1200 MG CAPS Take 1,200 mg by mouth daily.    [provider]  omeprazole (PRILOSEC) 20 MG capsule TAKE 1 CAPSULE BY MOUTH  DAILY 07/04/16   Jerrol Banana., MD  warfarin (COUMADIN) 2 MG tablet Take 1 tablet (2 mg total) by mouth daily. 06/05/16   Jerrol Banana., MD  warfarin (COUMADIN) 2.5 MG tablet Take 1 tablet (2.5 mg total) by mouth daily. 11/10/16   Jerrol Banana., MD    Allergies:  Allergies  Allergen Reactions  . Atorvastatin   . Simvastatin     Social History   Social History  . Marital status: Married    Spouse name: N/A  . Number of children: 1  . Years of education: N/A   Occupational  History  . Not on file.   Social History Main Topics  . Smoking status: Former Smoker    Quit date: 12/01/1987  . Smokeless tobacco: Never Used  . Alcohol use No  . Drug use: No  . Sexual activity: Not on file   Other Topics Concern  . Not on file   Social History Narrative  . No narrative on file     Family History  Problem Relation Age of Onset  . Heart attack Father 30       Died w/ Heart attack  . Diabetes Father   . Heart attack Brother   . Heart disease Brother   . Diabetes Sister   . Heart disease Sister      ROS:  Please see the history of present illness.     All other systems reviewed and negative.    Physical Exam:  Blood pressure 108/80, pulse 77, height 6' 1.5" (1.867 m), weight 197 lb 12 oz (89.7 kg). General: Well developed, well nourished male in no acute distress. Head: Normocephalic, atraumatic, sclera non-icteric, no xanthomas, nares are without discharge. EENT: normal  Lymph Nodes:  none Neck: Negative for carotid bruits. JVD 6 cm  Back:without scoliosis kyphosis Lungs: Clear bilaterally to auscultation without wheezes, rales, or rhonchi. Breathing is unlabored. Heart: Irregularly irregular rate and rhythm with a 3/6 apical systolic murmur . No rubs, or gallops appreciated. Abdomen: Soft, non-tender, non-distended with normoactive bowel sounds. No hepatomegaly. No rebound/guarding. No obvious abdominal masses. Msk:  Strength and tone appear normal for age. Extremities: No clubbing or cyanosis.  1+  edema.  Distal pedal pulses are 2+ and equal bilaterally. Skin: Warm and Dry Neuro: Alert and oriented X 3. CN III-XII intact Grossly normal sensory and motor function . Psych:  Responds to questions appropriately with a normal affect.      Labs: Cardiac Enzymes No results for input(s): CKTOTAL, CKMB, TROPONINI in the last 72 hours. CBC Lab Results  Component Value Date   WBC 5.2 06/05/2016   HGB 14.5 10/09/2014   HCT 43.2 06/05/2016   MCV 95  06/05/2016   PLT 154 06/05/2016   PROTIME: No results for input(s): LABPROT, INR in the last 72 hours. Chemistry No results for input(s): NA, K, CL, CO2, BUN, CREATININE, CALCIUM, PROT, BILITOT, ALKPHOS, ALT, AST, GLUCOSE in the last 168 hours.  Invalid input(s): LABALBU Lipids Lab Results  Component Value Date   CHOL 223 (H) 06/05/2016   HDL 35 (L) 06/05/2016   LDLCALC 154 (H) 06/05/2016   TRIG 170 (H) 06/05/2016   BNP No results found for: PROBNP Thyroid Function Tests: No results for input(s): TSH, T4TOTAL, T3FREE,  THYROIDAB in the last 72 hours.  Invalid input(s): FREET3 Miscellaneous No results found for: DDIMER  Radiology/Studies:  No results found.  EKG:  Atrial fibrillation at 77 Intervals-/15/41 IVCD with a QR in leads 1 and L in the left bundle branchlike pattern  Assessment and Plan:  Atrial fibrillation-permanent  Mitral regurgitation-moderate-severe  Acute on chronic systolic heart failure  Ischemic heart disease with prior stenting  Statin intolerance   The patient is having more severe heart failure symptoms.  He responded previously to increase diuretics and there is objective evidence of volume overload. We will increase his furosemide 20--40 mg and maintain it. We will plan to check a metabolic profile in about 2 weeks time. We have discussed the potential concerns regarding intravascular dehydration  His blood pressure is relatively low. I think this will prevent [place] from trying to use Entresto.  Heart rate excursion may be contributing to his heart failure as could be ischemia. Empiric therapy directed at the latter might be most appropriate. If his shortness of breath continues to be an issue, 24 Holter monitoring or a modified Bruce protocol treadmill might help [place] get a sense of his heart rate excursion with exercise.  He is statin intolerant. He is not taking his niacin. I would concur.  We will see him in a couple of weeks to assess response  to therapy. It may be that his diuretics at that point can be changed to 20 alternating daily with 40  Joseph Barton

## 2016-11-21 NOTE — Patient Instructions (Signed)
Medication Instructions: - Your physician has recommended you make the following change in your medication:  1) Increase lasix (furosemide) 20 mg- take 2 tablets (40 mg) by mouth once daily  Labwork: - Your physician recommends that you return for lab work in: 2 weeks- BMP  Procedures/Testing: - none ordered  Follow-Up: - Your physician recommends that you schedule a follow-up appointment in: 3 weeks with the Elza Rafter, NP  - Your physician wants you to follow-up in: 6 months with Dr. Caryl Comes. You will receive a reminder letter in the mail two months in advance. If you don't receive a letter, please call our office to schedule the follow-up appointment.  Any Additional Special Instructions Will Be Listed Below (If Applicable).     If you need a refill on your cardiac medications before your next appointment, please call your pharmacy.

## 2016-12-05 ENCOUNTER — Other Ambulatory Visit: Payer: Self-pay | Admitting: Internal Medicine

## 2016-12-05 ENCOUNTER — Other Ambulatory Visit (INDEPENDENT_AMBULATORY_CARE_PROVIDER_SITE_OTHER): Payer: Medicare Other

## 2016-12-05 DIAGNOSIS — I5022 Chronic systolic (congestive) heart failure: Secondary | ICD-10-CM | POA: Diagnosis not present

## 2016-12-06 LAB — BASIC METABOLIC PANEL
BUN/Creatinine Ratio: 17 (ref 10–24)
BUN: 18 mg/dL (ref 8–27)
CALCIUM: 9 mg/dL (ref 8.6–10.2)
CO2: 25 mmol/L (ref 18–29)
Chloride: 98 mmol/L (ref 96–106)
Creatinine, Ser: 1.03 mg/dL (ref 0.76–1.27)
GFR calc Af Amer: 75 mL/min/{1.73_m2} (ref >59)
GFR calc non Af Amer: 65 mL/min/{1.73_m2} (ref >59)
GLUCOSE: 96 mg/dL (ref 65–99)
Potassium: 4.6 mmol/L (ref 3.5–5.2)
Sodium: 137 mmol/L (ref 134–144)

## 2016-12-08 ENCOUNTER — Ambulatory Visit (INDEPENDENT_AMBULATORY_CARE_PROVIDER_SITE_OTHER): Payer: Medicare Other

## 2016-12-08 DIAGNOSIS — I482 Chronic atrial fibrillation, unspecified: Secondary | ICD-10-CM

## 2016-12-08 LAB — POCT INR
INR: 3.1
PT: 36.7

## 2016-12-08 NOTE — Patient Instructions (Signed)
Anticoagulation Warfarin Dose Instructions as of 12/08/2016      Dorene Grebe Tue Wed Thu Fri Sat   New Dose 2.5 mg 1.25 mg 2.5 mg 1.25 mg 2.5 mg 1.25 mg 2.5 mg    Description    2.5 mg daily and except 1.25 mg Monday, Wed, Fri F/U 3 weeks

## 2016-12-14 ENCOUNTER — Ambulatory Visit (INDEPENDENT_AMBULATORY_CARE_PROVIDER_SITE_OTHER): Payer: Medicare Other | Admitting: Nurse Practitioner

## 2016-12-14 ENCOUNTER — Encounter: Payer: Self-pay | Admitting: Nurse Practitioner

## 2016-12-14 VITALS — BP 110/64 | HR 68 | Ht 73.5 in | Wt 197.0 lb

## 2016-12-14 DIAGNOSIS — I5022 Chronic systolic (congestive) heart failure: Secondary | ICD-10-CM

## 2016-12-14 DIAGNOSIS — I2510 Atherosclerotic heart disease of native coronary artery without angina pectoris: Secondary | ICD-10-CM

## 2016-12-14 DIAGNOSIS — I482 Chronic atrial fibrillation: Secondary | ICD-10-CM

## 2016-12-14 DIAGNOSIS — I4821 Permanent atrial fibrillation: Secondary | ICD-10-CM

## 2016-12-14 NOTE — Progress Notes (Signed)
Office Visit    Patient Name: Joseph Barton Date of Encounter: 12/14/2016  Primary Care Provider:  Jerrol Banana., MD Primary Cardiologist:  Olin Pia, MD   Chief Complaint    83 year old male with a history of CAD status post LAD stenting and anterior MI, ischemic cardiopathy, HFrEF, hyperlipidemia, moderate to severe mitral regurgitation, abdominal aortic aneurysm, and permanent atrial fibrillation on Coumadin, who presents for follow-up.  Past Medical History    Past Medical History:  Diagnosis Date  . Abdominal aortic aneurysm ([place]) 01-2004   4.7 x 4.7 cm.  Marland Kitchen CAD (coronary artery disease) 01/25/2004   a. 01/2004 Ant MI/DES to LAD;  b. 10/2011 Neg MV.  . Chronic systolic heart failure ([place])    a. 08/2016 Echo: EF 35%, nl RV fxn, mod to sev MR, mild to mod TR, mod biatrial enlargement.  . Hyperlipidemia   . Ischemic cardiomyopathy    a. 08/2016 Echo: EF 35%.  . Moderate to Severe Mitral regurgitation    a. 08/2016 Echo: Mod-sev MR.  Marland Kitchen Permanent atrial fibrillation ([place])    a. Chronic coumadin (CHA2DS2VASc = 5).  . Pulmonary nodules    Noted on abdominal CT  . Statin intolerance    Past Surgical History:  Procedure Laterality Date  . ABDOMINAL AORTIC ANEURYSM REPAIR     12/02/2007 [facility]  . ABDOMINAL AORTIC ANEURYSM REPAIR  2006   [facility]   . CARDIAC CATHETERIZATION  2009   [facility]  . CORONARY ANGIOPLASTY WITH STENT PLACEMENT  2005   Cypher stent LAD   . Cypher stents to LAD     01/25/2004  . SKIN SURGERY  2016   [facility] skin cancer    Allergies  Allergies  Allergen Reactions  . Atorvastatin   . Simvastatin     History of Present Illness    83 year old male with the above complex past medical history including coronary artery disease status post anterior myocardial infarction 2005 requiring drug-eluting stent placement to the LAD. He also has a history of HFrEF and ischemic cardio myopathy with an EF of 35% by echocardiogram in March 2018. Echo also  showed moderate to severe mitral regurgitation which is being conservatively managed. Other history includes hyperlipidemia, permanent atrial fibrillation on chronic Coumadin anticoagulation, and abdominal aortic aneurysm measuring 4.7 x 4.7 cm in 2005. Per notes, he has not wished to pursue any additional imaging of his AAA. He was seen in clinic by Dr. Caryl Comes on May 22 to establish care here. He was I'm overloaded and his Lasix was increased from 20 mg daily to 40 mg daily. Follow-up basic metabolic panel showed stable renal function and electrolytes. He has noted that since the change in his Lasix dose, his breathing has improved significantly. He has also had resolution of lower extremity swelling. He has more exercise tolerance. He denies chest pain, palpitations, PND, orthopnea, dizziness, syncope, or early satiety.  Home Medications    Prior to Admission medications   Medication Sig Start Date End Date Taking? Authorizing Provider  albuterol (PROVENTIL HFA;VENTOLIN HFA) 108 (90 Base) MCG/ACT inhaler Inhale into the lungs. 09/22/16 09/22/17 Yes [provider]  Bioflavonoid Products (BIOFLEX PO) Take 1 tablet by mouth daily.    Yes [provider]  Cetirizine HCl (ZYRTEC ALLERGY) 10 MG CAPS Take 1 capsule by mouth daily.    Yes [provider]  digoxin (LANOXIN) 0.125 MG tablet Take 1 tablet (0.125 mg total) by mouth daily. 08/09/15 12/14/16 Yes Venia Minks,  Izora Gala, MD  furosemide (LASIX) 20 MG tablet Take 2 tablets (40 mg) by mouth once daily 09/30/11  Yes [provider]  lisinopril (PRINIVIL,ZESTRIL) 2.5 MG tablet Take 2.5 mg by mouth daily.  01/20/14  Yes [provider]  metoprolol succinate (TOPROL-XL) 25 MG 24 hr tablet Take 25 mg by mouth daily.   Yes [provider]  omeprazole (PRILOSEC) 20 MG capsule TAKE 1 CAPSULE BY MOUTH  DAILY 07/04/16  Yes Jerrol Banana., MD  warfarin (COUMADIN) 2.5 MG tablet Take 1 tablet (2.5 mg total) by mouth  daily. 11/10/16  Yes Jerrol Banana., MD    Review of Systems    As above, he has been feeling better with the increased dose of Lasix.  He denies chest pain, palpitations, dyspnea, pnd, orthopnea, n, v, dizziness, syncope, edema, weight gain, or early satiety.  All other systems reviewed and are otherwise negative except as noted above.  Physical Exam    VS:  BP 110/64 (BP Location: Left Arm, Patient Position: Sitting, Cuff Size: Normal)   Pulse 68   Ht 6' 1.5" (1.867 m)   Wt 197 lb (89.4 kg)   BMI 25.64 kg/m  , BMI Body mass index is 25.64 kg/m. GEN: Well nourished, well developed, in no acute distress.  HEENT: normal.  Neck: Supple, no JVD, carotid bruits, or masses. Cardiac: Irregularly irregular, 2/6 systolic murmur at the apex, no rubs, or gallops. No clubbing, cyanosis, trace right ankle edema.  Radials/DP/PT 2+ and equal bilaterally.  Respiratory:  Respirations regular and unlabored, clear to auscultation bilaterally. GI: Soft, nontender, nondistended, BS + x 4. MS: no deformity or atrophy. Skin: warm and dry, no rash. Neuro:  Strength and sensation are intact. Psych: Normal affect.  Accessory Clinical Findings    ECG - Atrial fibrillation, 70, left bundle branch block, no acute changes.  Lab Results  Component Value Date   CREATININE 1.03 12/05/2016   BUN 18 12/05/2016   NA 137 12/05/2016   K 4.6 12/05/2016   CL 98 12/05/2016   CO2 25 12/05/2016     Assessment & Plan    1.  Chronic systolic congestive heart failure/ischemic myopathy: Volume has improved today. Weight is down 1 pound since his last clinic visit. His weights of his stable on his home scale at 191-192 pounds. He has had improved exercise tolerance and reduced dyspnea. Plan to continue current dose of Lasix at 40 mg daily. He otherwise remains on beta blocker, ACE inhibitor, and digoxin. No spironolactone in the setting of soft blood pressures.  2. Coronary artery disease: Status post prior  anterior MI in 2005 with negative Myoview in 2013. He has not been having any chest pain. No further ischemic evaluation warranted at this time. Continue beta blocker therapy. He is intolerant to statins and is no longer taking niacin or fish oil.  3. Hyperlipidemia: Intolerant to statins. Is no longer taking niacin or fish oil. Not interested in Zetia.  4. Permanent atrial fibrillation: His is rate controlled on beta blocker and digoxin therapy. He is anticoagulated with Coumadin and followed in our Coumadin clinic.  5. Moderate to severe mitral regurgitation: Symptoms are stable. Continue conservative therapy.  6. Disposition: Patient will follow up in 4-6 weeks to ensure stability.  Murray Hodgkins, NP 12/14/2016, 2:08 PM

## 2016-12-14 NOTE — Patient Instructions (Signed)
Medication Instructions:  Your physician recommends that you continue on your current medications as directed. Please refer to the Current Medication list given to you today.   Labwork: none  Testing/Procedures: none  Follow-Up: Your physician recommends that you schedule a follow-up appointment in: [place].   If you need a refill on your cardiac medications before your next appointment, please call your pharmacy.

## 2016-12-22 DIAGNOSIS — J301 Allergic rhinitis due to pollen: Secondary | ICD-10-CM | POA: Diagnosis not present

## 2016-12-22 DIAGNOSIS — H6123 Impacted cerumen, bilateral: Secondary | ICD-10-CM | POA: Diagnosis not present

## 2016-12-28 ENCOUNTER — Telehealth: Payer: Self-pay | Admitting: Internal Medicine

## 2016-12-28 ENCOUNTER — Other Ambulatory Visit: Payer: Self-pay | Admitting: *Deleted

## 2016-12-28 MED ORDER — FUROSEMIDE 20 MG PO TABS: 40.0000 mg | ORAL_TABLET | Freq: Every day | ORAL | 3 refills | Status: DC

## 2016-12-28 NOTE — Telephone Encounter (Signed)
Furosemide 20 mg tablet sent to [id number] R#[id number].

## 2016-12-28 NOTE — Telephone Encounter (Signed)
Requested Prescriptions   Signed Prescriptions Disp Refills  . furosemide (LASIX) 20 MG tablet 180 tablet 3    Sig: Take 2 tablets (40 mg total) by mouth daily. Take 2 tablets (40 mg) by mouth once daily    Authorizing Provider: Deboraha Sprang    Ordering User: Britt Bottom

## 2016-12-28 NOTE — Telephone Encounter (Signed)
°*  STAT* If patient is at the pharmacy, call can be transferred to refill team.   1. Which medications need to be refilled? (please list name of each medication and dose if known) Furosemide 20 mg   2. Which pharmacy/location (including street and city if local pharmacy) is medication to be sent to? [facility]   3. Do they need a 30 day or 90 day supply? 90 day

## 2016-12-29 ENCOUNTER — Ambulatory Visit (INDEPENDENT_AMBULATORY_CARE_PROVIDER_SITE_OTHER): Payer: Medicare Other

## 2016-12-29 DIAGNOSIS — I482 Chronic atrial fibrillation, unspecified: Secondary | ICD-10-CM

## 2016-12-29 LAB — POCT INR
INR: 2.1
PT: 25.3

## 2016-12-29 NOTE — Patient Instructions (Signed)
Anticoagulation Warfarin Dose Instructions as of 12/29/2016      Joseph Barton Tue Wed Thu Fri Sat   New Dose 2.5 mg 1.25 mg 2.5 mg 1.25 mg 2.5 mg 1.25 mg 2.5 mg    Description    2.5 mg daily and except 1.25 mg Monday, Wed, Fri F/U 3 weeks

## 2017-01-17 ENCOUNTER — Encounter: Payer: Self-pay | Admitting: Nurse Practitioner

## 2017-01-17 ENCOUNTER — Ambulatory Visit (INDEPENDENT_AMBULATORY_CARE_PROVIDER_SITE_OTHER): Payer: Medicare Other | Admitting: Nurse Practitioner

## 2017-01-17 VITALS — BP 122/76 | HR 81 | Ht 73.0 in | Wt 197.0 lb

## 2017-01-17 DIAGNOSIS — I340 Nonrheumatic mitral (valve) insufficiency: Secondary | ICD-10-CM

## 2017-01-17 DIAGNOSIS — I255 Ischemic cardiomyopathy: Secondary | ICD-10-CM | POA: Diagnosis not present

## 2017-01-17 DIAGNOSIS — I5022 Chronic systolic (congestive) heart failure: Secondary | ICD-10-CM

## 2017-01-17 DIAGNOSIS — I482 Chronic atrial fibrillation: Secondary | ICD-10-CM

## 2017-01-17 DIAGNOSIS — I4821 Permanent atrial fibrillation: Secondary | ICD-10-CM

## 2017-01-17 DIAGNOSIS — I2510 Atherosclerotic heart disease of native coronary artery without angina pectoris: Secondary | ICD-10-CM

## 2017-01-17 NOTE — Progress Notes (Signed)
Office Visit    Patient Name: Joseph Barton Date of Encounter: 01/17/2017  Primary Care Provider:  Jerrol Banana., MD Primary Cardiologist:  Olin Pia, MD   Chief Complaint    83 year old male with history of CAD status post LAD stenting and anterior MI, ischemic cardiomyopathy, HFrEF, hyperlipidemia, moderate to severe mitral regurgitation, abdominal aortic aneurysm, and permanent atrial fibrillation on chronic Coumadin therapy, who presents for follow-up.  Past Medical History    Past Medical History:  Diagnosis Date  . Abdominal aortic aneurysm ([place]) 01-2004   4.7 x 4.7 cm.  Marland Kitchen CAD (coronary artery disease) 01/25/2004   a. 01/2004 Ant MI/DES to LAD;  b. 10/2011 Neg MV.  . Chronic systolic heart failure ([place])    a. 08/2016 Echo: EF 35%, nl RV fxn, mod to sev MR, mild to mod TR, mod biatrial enlargement.  . Hyperlipidemia   . Ischemic cardiomyopathy    a. 08/2016 Echo: EF 35%.  . Moderate to Severe Mitral regurgitation    a. 08/2016 Echo: Mod-sev MR.  Marland Kitchen Permanent atrial fibrillation ([place])    a. Chronic coumadin (CHA2DS2VASc = 5).  . Pulmonary nodules    Noted on abdominal CT  . Statin intolerance    Past Surgical History:  Procedure Laterality Date  . ABDOMINAL AORTIC ANEURYSM REPAIR     12/02/2007 [facility]  . ABDOMINAL AORTIC ANEURYSM REPAIR  2006   [facility]   . CARDIAC CATHETERIZATION  2009   [facility]  . CORONARY ANGIOPLASTY WITH STENT PLACEMENT  2005   Cypher stent LAD   . Cypher stents to LAD     01/25/2004  . SKIN SURGERY  2016   [facility] skin cancer    Allergies  Allergies  Allergen Reactions  . Atorvastatin   . Simvastatin     History of Present Illness    83 year old male with the above complex past medical history including CAD status post anterior MI in 2005 with drug-eluting stent placement to the LAD. He also has a history of HFrEF and ischemic cardiomyopathy with an EF of 35% by echo in March 2018. Echo also showed moderate to severe mitral  regurgitation and this has been conservatively managed. Other history includes hyperlipidemia, permanent atrial fibrillation on chronic Coumadin, and abdominal aortic aneurysm measuring 4.7 x 4.7 cm in 2005. Per his wishes, we have not pursued any additional imaging of his abdominal aortic aneurysm. Earlier this year, he was evaluated by Dr. Caryl Comes secondary to volume excess. His Lasix dose was increased and I saw him in June, at which time he was feeling better with resolution of lower extremity swelling. Since his last visit, he is continued to do well. His weight has been stable at 191-192 pounds on his home scale. He denies chest pain, palpitations, dyspnea, PND, orthopnea, dizziness, syncope, edema, or early satiety. He walks somewhat home and also mows his yard and some others yards on a riding mower. He enjoys this.  Home Medications    Prior to Admission medications   Medication Sig Start Date End Date Taking? Authorizing Provider  albuterol (PROVENTIL HFA;VENTOLIN HFA) 108 (90 Base) MCG/ACT inhaler Inhale into the lungs. 09/22/16 09/22/17 Yes [provider]  Bioflavonoid Products (BIOFLEX PO) Take 1 tablet by mouth daily.    Yes [provider]  furosemide (LASIX) 20 MG tablet Take 2 tablets (40 mg total) by mouth daily. Take 2 tablets (40 mg) by mouth once daily 12/28/16  Yes Deboraha Sprang, MD  lisinopril (PRINIVIL,ZESTRIL) 2.5 MG tablet Take 2.5 mg by mouth daily.  01/20/14  Yes [provider]  metoprolol succinate (TOPROL-XL) 25 MG 24 hr tablet Take 25 mg by mouth daily.   Yes [provider]  omeprazole (PRILOSEC) 20 MG capsule TAKE 1 CAPSULE BY MOUTH  DAILY 07/04/16  Yes Jerrol Banana., MD  warfarin (COUMADIN) 2.5 MG tablet Take 1 tablet (2.5 mg total) by mouth daily. 11/10/16  Yes Jerrol Banana., MD  digoxin (LANOXIN) 0.125 MG tablet Take 1 tablet (0.125 mg total) by mouth daily. 08/09/15 12/14/16  Margarita Rana, MD    Review of Systems      As above, he has been doing well.  He denies chest pain, palpitations, dyspnea, pnd, orthopnea, n, v, dizziness, syncope, edema, weight gain, or early satiety.  All other systems reviewed and are otherwise negative except as noted above.  Physical Exam    VS:  BP 122/76 (BP Location: Left Arm, Patient Position: Sitting, Cuff Size: Normal)   Pulse 81   Ht 6\' 1"  (1.854 m)   Wt 197 lb (89.4 kg)   BMI 25.99 kg/m  , BMI Body mass index is 25.99 kg/m. GEN: Well nourished, well developed, in no acute distress.  HEENT: normal.  Neck: Supple, no JVD, carotid bruits, or masses. Cardiac: IR, IR, no murmurs, rubs, or gallops. No clubbing, cyanosis, edema.  Radials/DP/PT 2+ and equal bilaterally.  Respiratory:  Respirations regular and unlabored, clear to auscultation bilaterally. GI: Soft, nontender, nondistended, BS + x 4. MS: no deformity or atrophy. Skin: warm and dry, no rash. Neuro:  Strength and sensation are intact. Psych: Normal affect.  Accessory Clinical Findings    ECG - Atrial fibrillation, 81, left bundle branch block, no acute ST or T changes.  Assessment & Plan    1.  Chronic systolic congestive heart failure/ischemic cardiomyopathy: Volume remained stable and his weight has been stable at home over the past month. He has not had any significant dyspnea or edema. He remains on Lasix 40 mg daily, beta blocker, ACE inhibitor, and digoxin therapy.  2. Coronary artery disease: Status post prior anterior MI in 2005 with negative Myoview in 2013. He has done well without chest pain. Continue beta blocker. He is intolerant to statins and is no longer taking niacin or fish oil. He has previously declined Zetia.  3. Hyperlipidemia: As above, intolerant to statins and is not interested in Zetia. He is no longer taking niacin or fish oil.  4. Permanent atrial fibrillation: This is rate controlled on beta blocker and digoxin therapy. He is anticoagulated with Coumadin and followed in our  Coumadin clinic. Next  5. Moderate to severe mitral regurgitation: Symptoms have been stable. Continue conservative therapy. Next  6. Disposition: Patient will follow up with Dr. Caryl Comes in 3 months or sooner if necessary.  Murray Hodgkins, NP 01/17/2017, 9:16 AM

## 2017-01-17 NOTE — Patient Instructions (Signed)
Medication Instructions:  Your physician recommends that you continue on your current medications as directed. Please refer to the Current Medication list given to you today.   Labwork: none  Testing/Procedures: none  Follow-Up: Your physician recommends that you schedule a follow-up appointment in: 3 months with Dr. Caryl Comes.    Any Other Special Instructions Will Be Listed Below (If Applicable).     If you need a refill on your cardiac medications before your next appointment, please call your pharmacy.

## 2017-01-26 ENCOUNTER — Ambulatory Visit (INDEPENDENT_AMBULATORY_CARE_PROVIDER_SITE_OTHER): Payer: Medicare Other

## 2017-01-26 DIAGNOSIS — I482 Chronic atrial fibrillation, unspecified: Secondary | ICD-10-CM

## 2017-01-26 LAB — POCT INR
INR: 2.2
PT: 25.9

## 2017-01-26 NOTE — Patient Instructions (Signed)
Anticoagulation Warfarin Dose Instructions as of 01/26/2017      Dorene Grebe Tue Wed Thu Fri Sat   New Dose 2.5 mg 1.25 mg 2.5 mg 1.25 mg 2.5 mg 1.25 mg 2.5 mg    Description    2.5 mg daily and except 1.25 mg Monday, Wed, Fri F/U 4 weeks

## 2017-01-30 ENCOUNTER — Other Ambulatory Visit: Payer: Self-pay | Admitting: *Deleted

## 2017-01-30 ENCOUNTER — Telehealth: Payer: Self-pay | Admitting: Internal Medicine

## 2017-01-30 MED ORDER — LISINOPRIL 2.5 MG PO TABS: 2.5000 mg | ORAL_TABLET | Freq: Every day | ORAL | 3 refills | Status: DC

## 2017-01-30 MED ORDER — METOPROLOL SUCCINATE ER 25 MG PO TB24: 25.0000 mg | ORAL_TABLET | Freq: Every day | ORAL | 3 refills | Status: DC

## 2017-01-30 NOTE — Telephone Encounter (Signed)
°*  STAT* If patient is at the pharmacy, call can be transferred to refill team.   1. Which medications need to be refilled? (please list name of each medication and dose if known)   Lisinopril  Metoprolol   2. Which pharmacy/location (including street and city if local pharmacy) is medication to be sent to? [facility]   3. Do they need a 30 day or 90 day supply? 90 day

## 2017-01-30 NOTE — Telephone Encounter (Signed)
Requested Prescriptions   Signed Prescriptions Disp Refills  . metoprolol succinate (TOPROL-XL) 25 MG 24 hr tablet 90 tablet 3    Sig: Take 1 tablet (25 mg total) by mouth daily.    Authorizing Provider: Deboraha Sprang    Ordering User: Britt Bottom

## 2017-01-30 NOTE — Telephone Encounter (Signed)
Requested Prescriptions   Signed Prescriptions Disp Refills  . lisinopril (PRINIVIL,ZESTRIL) 2.5 MG tablet 90 tablet 3    Sig: Take 1 tablet (2.5 mg total) by mouth daily.    Authorizing Provider: Deboraha Sprang    Ordering User: Britt Bottom

## 2017-01-30 NOTE — Telephone Encounter (Signed)
Requested Prescriptions   Signed Prescriptions Disp Refills  . lisinopril (PRINIVIL,ZESTRIL) 2.5 MG tablet 90 tablet 3    Sig: Take 1 tablet (2.5 mg total) by mouth daily.    Authorizing Provider: Deboraha Sprang    Ordering User: Britt Bottom   Requested Prescriptions   Signed Prescriptions Disp Refills  . metoprolol succinate (TOPROL-XL) 25 MG 24 hr tablet 90 tablet 3    Sig: Take 1 tablet (25 mg total) by mouth daily.    Authorizing Provider: Deboraha Sprang    Ordering User: Britt Bottom

## 2017-02-15 ENCOUNTER — Telehealth: Payer: Self-pay | Admitting: Family Medicine

## 2017-02-15 NOTE — Telephone Encounter (Signed)
Left message to see if he can come for awv 02-23-17 at 9:30 or 10 while he is here for coumadin clinic.

## 2017-02-19 ENCOUNTER — Telehealth: Payer: Self-pay | Admitting: Internal Medicine

## 2017-02-19 DIAGNOSIS — I255 Ischemic cardiomyopathy: Secondary | ICD-10-CM

## 2017-02-19 MED ORDER — DIGOXIN 125 MCG PO TABS: 0.1250 mg | ORAL_TABLET | Freq: Every day | ORAL | 3 refills | Status: DC

## 2017-02-19 NOTE — Telephone Encounter (Signed)
Please advise if ok to refill Digoxin last filled by PCP.

## 2017-02-19 NOTE — Telephone Encounter (Signed)
Refill sent to [facility].

## 2017-02-19 NOTE — Telephone Encounter (Signed)
Pt wife calling stating pt only has 5 days left on pills    *STAT* If patient is at the pharmacy, call can be transferred to refill team.   1. Which medications need to be refilled? (please list name of each medication and dose if known)  Digoxin   2. Which pharmacy/location (including street and city if local pharmacy) is medication to be sent to? [facility]   3. Do they need a 30 day or 90 day supply?  90 day

## 2017-02-23 ENCOUNTER — Ambulatory Visit (INDEPENDENT_AMBULATORY_CARE_PROVIDER_SITE_OTHER): Payer: Medicare Other

## 2017-02-23 ENCOUNTER — Ambulatory Visit (INDEPENDENT_AMBULATORY_CARE_PROVIDER_SITE_OTHER): Payer: Medicare Other | Admitting: Emergency Medicine

## 2017-02-23 VITALS — BP 128/74 | HR 60 | Temp 97.5°F | Ht 73.0 in | Wt 199.0 lb

## 2017-02-23 DIAGNOSIS — I482 Chronic atrial fibrillation, unspecified: Secondary | ICD-10-CM

## 2017-02-23 DIAGNOSIS — Z0000 Encounter for general adult medical examination without abnormal findings: Secondary | ICD-10-CM

## 2017-02-23 DIAGNOSIS — Z23 Encounter for immunization: Secondary | ICD-10-CM | POA: Diagnosis not present

## 2017-02-23 LAB — POCT INR
INR: 3.5
PT: 42.1

## 2017-02-23 NOTE — Patient Instructions (Signed)
Anticoagulation Warfarin Dose Instructions as of 02/23/2017      Joseph Barton Tue Wed Thu Fri Sat   New Dose 2.5 mg 1.25 mg 2.5 mg 1.25 mg 2.5 mg 1.25 mg 2.5 mg    Description    Hold for 1 day then continue 2.5 mg daily and except 1.25 mg Monday, Wed, Fri F/U 3 weeks

## 2017-02-23 NOTE — Progress Notes (Signed)
Subjective:   Joseph Barton is a 83 y.o. male who presents for Medicare Annual/Subsequent preventive examination.  Review of Systems:  N/A Cardiac Risk Factors include: advanced age (>60men, >70 women);dyslipidemia;hypertension;male gender     Objective:    Vitals: BP 128/74 (BP Location: Right Arm)   Pulse 60   Temp (!) 97.5 F (36.4 C) (Oral)   Ht 6\' 1"  (1.854 m)   Wt 199 lb (90.3 kg)   BMI 26.25 kg/m   Body mass index is 26.25 kg/m.  Tobacco History  Smoking Status  . Former Smoker  . Quit date: 12/01/1987  Smokeless Tobacco  . Never Used     Counseling given: Not Answered   Past Medical History:  Diagnosis Date  . Abdominal aortic aneurysm ([place]) 01-2004   4.7 x 4.7 cm.  Marland Kitchen CAD (coronary artery disease) 01/25/2004   a. 01/2004 Ant MI/DES to LAD;  b. 10/2011 Neg MV.  . Chronic systolic heart failure ([place])    a. 08/2016 Echo: EF 35%, nl RV fxn, mod to sev MR, mild to mod TR, mod biatrial enlargement.  . Hyperlipidemia   . Ischemic cardiomyopathy    a. 08/2016 Echo: EF 35%.  . Moderate to Severe Mitral regurgitation    a. 08/2016 Echo: Mod-sev MR.  Marland Kitchen Permanent atrial fibrillation ([place])    a. Chronic coumadin (CHA2DS2VASc = 5).  . Pulmonary nodules    Noted on abdominal CT  . Statin intolerance    Past Surgical History:  Procedure Laterality Date  . ABDOMINAL AORTIC ANEURYSM REPAIR     12/02/2007 [facility]  . ABDOMINAL AORTIC ANEURYSM REPAIR  2006   [facility]   . CARDIAC CATHETERIZATION  2009   [facility]  . CORONARY ANGIOPLASTY WITH STENT PLACEMENT  2005   Cypher stent LAD   . Cypher stents to LAD     01/25/2004  . SKIN SURGERY  2016   [facility] skin cancer   Family History  Problem Relation Age of Onset  . Heart attack Father 7       Died w/ Heart attack  . Diabetes Father   . Heart attack Brother   . Heart disease Brother   . Diabetes Sister   . Heart disease Sister    History  Sexual Activity  . Sexual activity: Not on file    Outpatient Encounter  Prescriptions as of 02/23/2017  Medication Sig  . albuterol (PROVENTIL HFA;VENTOLIN HFA) 108 (90 Base) MCG/ACT inhaler Inhale 1-2 puffs into the lungs every 4 (four) hours as needed.   Marland Kitchen Bioflavonoid Products (BIOFLEX PO) Take 1 tablet by mouth daily.   . digoxin (LANOXIN) 0.125 MG tablet Take 1 tablet (0.125 mg total) by mouth daily.  . furosemide (LASIX) 20 MG tablet Take 2 tablets (40 mg total) by mouth daily. Take 2 tablets (40 mg) by mouth once daily  . lisinopril (PRINIVIL,ZESTRIL) 2.5 MG tablet Take 1 tablet (2.5 mg total) by mouth daily.  . metoprolol succinate (TOPROL-XL) 25 MG 24 hr tablet Take 1 tablet (25 mg total) by mouth daily.  Marland Kitchen omeprazole (PRILOSEC) 20 MG capsule TAKE 1 CAPSULE BY MOUTH  DAILY  . warfarin (COUMADIN) 2.5 MG tablet Take 1 tablet (2.5 mg total) by mouth daily. (Patient taking differently: Take 2.5 mg by mouth as directed. )   No facility-administered encounter medications on file as of 02/23/2017.     Activities of Daily Living In your present state of health, do you have any difficulty performing the following  activities: 02/23/2017  Hearing? N  Vision? N  Difficulty concentrating or making decisions? N  Walking or climbing stairs? N  Dressing or bathing? N  Doing errands, shopping? N  Preparing Food and eating ? N  Using the Toilet? N  In the past six months, have you accidently leaked urine? N  Do you have problems with loss of bowel control? N  Managing your Medications? N  Managing your Finances? N  Housekeeping or managing your Housekeeping? N  Some recent data might be hidden    Patient Care Team: Jerrol Banana., MD as PCP - General (Family Medicine) Lorelee Cover., MD as Consulting Physician (Ophthalmology) Deboraha Sprang, MD as Consulting Physician (Cardiology)   Assessment:     Exercise Activities and Dietary recommendations Current Exercise Habits: The patient does not participate in regular exercise at present (yardwork,  always busy doing something), Exercise limited by: None identified  Goals    . Increase water intake          Recommend increasing water intake to 4-6 glasses a day.       Fall Risk Fall Risk  02/23/2017 10/06/2015  Falls in the past year? No No   Depression Screen PHQ 2/9 Scores 02/23/2017 02/23/2017 10/06/2015  PHQ - 2 Score 0 0 0  PHQ- 9 Score 1 - -    Cognitive Function     6CIT Screen 02/23/2017  What Year? 0 points  What month? 3 points  What time? 0 points  Count back from 20 0 points  Months in reverse 0 points  Repeat phrase 4 points  Total Score 7    Immunization History  Administered Date(s) Administered  . Influenza, High Dose Seasonal PF 04/14/2016  . Influenza,inj,Quad PF,6+ Mos 05/14/2015  . Pneumococcal Conjugate-13 03/20/2014  . Pneumococcal Polysaccharide-23 02/23/2017  . Zoster 02/11/2008   Screening Tests Health Maintenance  Topic Date Due  . INFLUENZA VACCINE  01/31/2017  . TETANUS/TDAP  07/03/2026 (Originally 07/21/1945)  . PNA vac Low Risk Adult  Completed      Plan:  I have personally reviewed and addressed the Medicare Annual Wellness questionnaire and have noted the following in the patient's chart:  A. Medical and social history B. Use of alcohol, tobacco or illicit drugs  C. Current medications and supplements D. Functional ability and status E.  Nutritional status F.  Physical activity G. Advance directives H. List of other physicians I.  Hospitalizations, surgeries, and ER visits in previous 12 months J.  [place] such as hearing and vision if needed, cognitive and depression L. Referrals and appointments - none  In addition, I have reviewed and discussed with patient certain preventive protocols, quality metrics, and best practice recommendations. A written personalized care plan for preventive services as well as general preventive health recommendations were provided to patient.  See attached scanned questionnaire  for additional information.   Signed,  Fabio Neighbors, LPN Nurse Health Advisor   MD Recommendations: Pt needs flu vaccine at next OV - declined today. Also declined tetanus vaccine today.

## 2017-02-23 NOTE — Patient Instructions (Addendum)
[name] , Thank you for taking time to come for your Medicare Wellness Visit. I appreciate your ongoing commitment to your health goals. Please review the following plan we discussed and let me know if I can assist you in the future.   Screening recommendations/referrals: Colonoscopy: no longer required Recommended yearly ophthalmology/optometry visit for glaucoma screening and checkup Recommended yearly dental visit for hygiene and checkup  Vaccinations: Influenza vaccine: declined today Pneumococcal vaccine: completed series Tdap vaccine: declined Shingles vaccine: completed per pt  Advanced directives: Please bring a copy of your POA (Power of Attorney) and/or Living Will to your next appointment.   Conditions/risks identified: Recommend increasing water intake to 4-6 glasses a day.   Next appointment: None, need to schedule a physical with the PCP and 1 year AWV.  Preventive Care 59 Years and Older, Male Preventive care refers to lifestyle choices and visits with your health care provider that can promote health and wellness. What does preventive care include?  A yearly physical exam. This is also called an annual well check.  Dental exams once or twice a year.  Routine eye exams. Ask your health care provider how often you should have your eyes checked.  Personal lifestyle choices, including:  Daily care of your teeth and gums.  Regular physical activity.  Eating a healthy diet.  Avoiding tobacco and drug use.  Limiting alcohol use.  Practicing safe sex.  Taking low doses of aspirin every day.  Taking vitamin and mineral supplements as recommended by your health care provider. What happens during an annual well check? The services and screenings done by your health care provider during your annual well check will depend on your age, overall health, lifestyle risk factors, and family history of disease. Counseling  Your health care provider may ask you  questions about your:  Alcohol use.  Tobacco use.  Drug use.  Emotional well-being.  Home and relationship well-being.  Sexual activity.  Eating habits.  History of falls.  Memory and ability to understand (cognition).  Work and work Statistician. Screening  You may have the following tests or measurements:  Height, weight, and BMI.  Blood pressure.  Lipid and cholesterol levels. These may be checked every 5 years, or more frequently if you are over 52 years old.  Skin check.  Lung cancer screening. You may have this screening every year starting at age 77 if you have a 30-pack-year history of smoking and currently smoke or have quit within the past 15 years.  Fecal occult blood test (FOBT) of the stool. You may have this test every year starting at age 98.  Flexible sigmoidoscopy or colonoscopy. You may have a sigmoidoscopy every 5 years or a colonoscopy every 10 years starting at age 61.  Prostate cancer screening. Recommendations will vary depending on your family history and other risks.  Hepatitis C blood test.  Hepatitis B blood test.  Sexually transmitted disease (STD) testing.  Diabetes screening. This is done by checking your blood sugar (glucose) after you have not eaten for a while (fasting). You may have this done every 1-3 years.  Abdominal aortic aneurysm (AAA) screening. You may need this if you are a current or former smoker.  Osteoporosis. You may be screened starting at age 40 if you are at high risk. Talk with your health care provider about your test results, treatment options, and if necessary, the need for more tests. Vaccines  Your health care provider may recommend certain vaccines, such as:  Influenza  vaccine. This is recommended every year.  Tetanus, diphtheria, and acellular pertussis (Tdap, Td) vaccine. You may need a Td booster every 10 years.  Zoster vaccine. You may need this after age 51.  Pneumococcal 13-valent conjugate  (PCV13) vaccine. One dose is recommended after age 66.  Pneumococcal polysaccharide (PPSV23) vaccine. One dose is recommended after age 59. Talk to your health care provider about which screenings and vaccines you need and how often you need them. This information is not intended to replace advice given to you by your health care provider. Make sure you discuss any questions you have with your health care provider. Document Released: 07/16/2015 Document Revised: 03/08/2016 Document Reviewed: 04/20/2015 Elsevier Interactive Patient Education  2017 [place] Prevention in the Home Falls can cause injuries. They can happen to people of all ages. There are many things you can do to make your home safe and to help prevent falls. What can I do on the outside of my home?  Regularly fix the edges of walkways and driveways and fix any cracks.  Remove anything that might make you trip as you walk through a door, such as a raised step or threshold.  Trim any bushes or trees on the path to your home.  Use bright outdoor lighting.  Clear any walking paths of anything that might make someone trip, such as rocks or tools.  Regularly check to see if handrails are loose or broken. Make sure that both sides of any steps have handrails.  Any raised decks and porches should have guardrails on the edges.  Have any leaves, snow, or ice cleared regularly.  Use sand or salt on walking paths during winter.  Clean up any spills in your garage right away. This includes oil or grease spills. What can I do in the bathroom?  Use night lights.  Install grab bars by the toilet and in the tub and shower. Do not use towel bars as grab bars.  Use non-skid mats or decals in the tub or shower.  If you need to sit down in the shower, use a plastic, non-slip stool.  Keep the floor dry. Clean up any water that spills on the floor as soon as it happens.  Remove soap buildup in the tub or shower  regularly.  Attach bath mats securely with double-sided non-slip rug tape.  Do not have throw rugs and other things on the floor that can make you trip. What can I do in the bedroom?  Use night lights.  Make sure that you have a light by your bed that is easy to reach.  Do not use any sheets or blankets that are too big for your bed. They should not hang down onto the floor.  Have a firm chair that has side arms. You can use this for support while you get dressed.  Do not have throw rugs and other things on the floor that can make you trip. What can I do in the kitchen?  Clean up any spills right away.  Avoid walking on wet floors.  Keep items that you use a lot in easy-to-reach places.  If you need to reach something above you, use a strong step stool that has a grab bar.  Keep electrical cords out of the way.  Do not use floor polish or wax that makes floors slippery. If you must use wax, use non-skid floor wax.  Do not have throw rugs and other things on the floor that can  make you trip. What can I do with my stairs?  Do not leave any items on the stairs.  Make sure that there are handrails on both sides of the stairs and use them. Fix handrails that are broken or loose. Make sure that handrails are as long as the stairways.  Check any carpeting to make sure that it is firmly attached to the stairs. Fix any carpet that is loose or worn.  Avoid having throw rugs at the top or bottom of the stairs. If you do have throw rugs, attach them to the floor with carpet tape.  Make sure that you have a light switch at the top of the stairs and the bottom of the stairs. If you do not have them, ask someone to add them for you. What else can I do to help prevent falls?  Wear shoes that:  Do not have high heels.  Have rubber bottoms.  Are comfortable and fit you well.  Are closed at the toe. Do not wear sandals.  If you use a stepladder:  Make sure that it is fully  opened. Do not climb a closed stepladder.  Make sure that both sides of the stepladder are locked into place.  Ask someone to hold it for you, if possible.  Clearly mark and make sure that you can see:  Any grab bars or handrails.  First and last steps.  Where the edge of each step is.  Use tools that help you move around (mobility aids) if they are needed. These include:  Canes.  Walkers.  Scooters.  Crutches.  Turn on the lights when you go into a dark area. Replace any light bulbs as soon as they burn out.  Set up your furniture so you have a clear path. Avoid moving your furniture around.  If any of your floors are uneven, fix them.  If there are any pets around you, be aware of where they are.  Review your medicines with your doctor. Some medicines can make you feel dizzy. This can increase your chance of falling. Ask your doctor what other things that you can do to help prevent falls. This information is not intended to replace advice given to you by your health care provider. Make sure you discuss any questions you have with your health care provider. Document Released: 04/15/2009 Document Revised: 11/25/2015 Document Reviewed: 07/24/2014 Elsevier Interactive Patient Education  2017 [employer].

## 2017-03-06 ENCOUNTER — Other Ambulatory Visit: Payer: Self-pay | Admitting: Family Medicine

## 2017-03-06 DIAGNOSIS — K219 Gastro-esophageal reflux disease without esophagitis: Secondary | ICD-10-CM

## 2017-03-06 MED ORDER — OMEPRAZOLE 20 MG PO CPDR: 20.0000 mg | DELAYED_RELEASE_CAPSULE | Freq: Every day | ORAL | 0 refills | Status: DC

## 2017-03-06 NOTE — Telephone Encounter (Signed)
[facility] faxed a request on the following medication. Thanks CC   omeprazole (PRILOSEC) 20 MG capsule

## 2017-03-06 NOTE — Telephone Encounter (Signed)
Done-aa 

## 2017-03-16 ENCOUNTER — Ambulatory Visit: Payer: Self-pay

## 2017-03-23 ENCOUNTER — Ambulatory Visit (INDEPENDENT_AMBULATORY_CARE_PROVIDER_SITE_OTHER): Payer: Medicare Other

## 2017-03-23 DIAGNOSIS — I482 Chronic atrial fibrillation, unspecified: Secondary | ICD-10-CM

## 2017-03-23 LAB — POCT INR
INR: 1.5
PT: 18

## 2017-03-23 NOTE — Patient Instructions (Signed)
Anticoagulation Warfarin Dose Instructions as of 03/23/2017      Joseph Barton Tue Wed Thu Fri Sat   New Dose 2.5 mg 1.25 mg 2.5 mg 1.25 mg 2.5 mg 1.25 mg 2.5 mg    Description   Continue 2.5 mg daily and except 1.25 mg Monday, Wed, Fri F/U 3 weeks

## 2017-04-13 ENCOUNTER — Ambulatory Visit (INDEPENDENT_AMBULATORY_CARE_PROVIDER_SITE_OTHER): Payer: Medicare Other

## 2017-04-13 DIAGNOSIS — I482 Chronic atrial fibrillation, unspecified: Secondary | ICD-10-CM

## 2017-04-13 LAB — POCT INR
INR: 2.3
PT: 27.4

## 2017-04-13 NOTE — Patient Instructions (Signed)
Anticoagulation Warfarin Dose Instructions as of 04/13/2017      Joseph Barton Tue Wed Thu Fri Sat   New Dose 2.5 mg 1.25 mg 2.5 mg 1.25 mg 2.5 mg 1.25 mg 2.5 mg    Description   Continue 2.5 mg daily and except 1.25 mg Monday, Wed, Fri F/U 4 weeks

## 2017-04-24 ENCOUNTER — Other Ambulatory Visit: Payer: Self-pay | Admitting: Family Medicine

## 2017-04-24 DIAGNOSIS — K219 Gastro-esophageal reflux disease without esophagitis: Secondary | ICD-10-CM

## 2017-04-26 ENCOUNTER — Encounter: Payer: Self-pay | Admitting: Internal Medicine

## 2017-04-26 ENCOUNTER — Other Ambulatory Visit
Admission: RE | Admit: 2017-04-26 | Discharge: 2017-04-26 | Disposition: A | Discharge status: Home / Self Care | Payer: Medicare Other | Source: Ambulatory Visit | Attending: Internal Medicine | Admitting: Internal Medicine

## 2017-04-26 ENCOUNTER — Ambulatory Visit (INDEPENDENT_AMBULATORY_CARE_PROVIDER_SITE_OTHER): Payer: Medicare Other | Admitting: Internal Medicine

## 2017-04-26 VITALS — BP 126/84 | HR 61 | Ht 73.5 in | Wt 197.5 lb

## 2017-04-26 DIAGNOSIS — Z79899 Other long term (current) drug therapy: Secondary | ICD-10-CM | POA: Insufficient documentation

## 2017-04-26 DIAGNOSIS — Z7901 Long term (current) use of anticoagulants: Secondary | ICD-10-CM

## 2017-04-26 DIAGNOSIS — I4821 Permanent atrial fibrillation: Secondary | ICD-10-CM

## 2017-04-26 DIAGNOSIS — I482 Chronic atrial fibrillation: Secondary | ICD-10-CM | POA: Insufficient documentation

## 2017-04-26 DIAGNOSIS — I5022 Chronic systolic (congestive) heart failure: Secondary | ICD-10-CM | POA: Insufficient documentation

## 2017-04-26 DIAGNOSIS — Z5181 Encounter for therapeutic drug level monitoring: Secondary | ICD-10-CM | POA: Diagnosis not present

## 2017-04-26 LAB — CBC WITH DIFFERENTIAL/PLATELET
BASOS PCT: 1 %
Basophils Absolute: 0.1 10*3/uL (ref 0–0.1)
EOS ABS: 0.1 10*3/uL (ref 0–0.7)
Eosinophils Relative: 2 %
HCT: 44.9 % (ref 40.0–52.0)
HEMOGLOBIN: 14.7 g/dL (ref 13.0–18.0)
Lymphocytes Relative: 25 %
Lymphs Abs: 1.4 10*3/uL (ref 1.0–3.6)
MCH: 30.9 pg (ref 26.0–34.0)
MCHC: 32.8 g/dL (ref 32.0–36.0)
MCV: 94.3 fL (ref 80.0–100.0)
Monocytes Absolute: 0.5 10*3/uL (ref 0.2–1.0)
Monocytes Relative: 10 %
NEUTROS PCT: 62 %
Neutro Abs: 3.4 10*3/uL (ref 1.4–6.5)
PLATELETS: 139 10*3/uL — AB (ref 150–440)
RBC: 4.76 MIL/uL (ref 4.40–5.90)
RDW: 14.4 % (ref 11.5–14.5)
WBC: 5.5 10*3/uL (ref 3.8–10.6)

## 2017-04-26 LAB — BASIC METABOLIC PANEL
Anion gap: 9 (ref 5–15)
BUN: 24 mg/dL — ABNORMAL HIGH (ref 6–20)
CHLORIDE: 101 mmol/L (ref 101–111)
CO2: 28 mmol/L (ref 22–32)
CREATININE: 1.4 mg/dL — AB (ref 0.61–1.24)
Calcium: 9.2 mg/dL (ref 8.9–10.3)
GFR, EST AFRICAN AMERICAN: 50 mL/min — AB (ref >60)
GFR, EST NON AFRICAN AMERICAN: 43 mL/min — AB (ref >60)
Glucose, Bld: 122 mg/dL — ABNORMAL HIGH (ref 65–99)
POTASSIUM: 4.7 mmol/L (ref 3.5–5.1)
SODIUM: 138 mmol/L (ref 135–145)

## 2017-04-26 LAB — DIGOXIN LEVEL: DIGOXIN LVL: 1.1 ng/mL (ref 0.8–2.0)

## 2017-04-26 NOTE — Progress Notes (Signed)
ELECTROPHYSIOLOGY Evaluation  NOTE  Patient ID: Joseph Barton, MRN: [id number], DOB/AGE: October 12, 1926 83 y.o. Admit date: (Not on file) Date of Consult: 04/26/2017  Primary Physician: Jerrol Banana., MD Primary Cardiologist: [facility]   Joseph Barton is being seen today At his own request  HPI Joseph Barton is a 83 y.o. male   Seen in follow-up for ischemic heart disease and prior stenting and MI 2005 , permanent atrial fibrillation.  He has had long-standing mitral regurgitation. He also had congestive heart failure. He got frustrated at [facility] as he had noted increasing dyspnea on exertion associated with peripheral edema    He underwent Myoview scanning April 2013 demonstrating an ejection fraction of 13%. He has been on beta blockers and ACE inhibitors for a long time. Echocardiogram confirmed depressed left ventricular function moderate MR and TR and there was no evidence of ischemia on his Myoview Echo 2018 EF 35% ([facility]) with mod-severe MR.    Date Cr k DIG Hgb  3/18 1.4  1.3 (12/17) 15.2  6+/18 1.03 4.6     LDL 12/17 154  Has been unable to tolerate statins in the past.  He denies chest pain shortness of breath or edema at this point.  Past Medical History:  Diagnosis Date  . Abdominal aortic aneurysm ([place]) 01-2004   4.7 x 4.7 cm.  Marland Kitchen CAD (coronary artery disease) 01/25/2004   a. 01/2004 Ant MI/DES to LAD;  b. 10/2011 Neg MV.  . Chronic systolic heart failure ([place])    a. 08/2016 Echo: EF 35%, nl RV fxn, mod to sev MR, mild to mod TR, mod biatrial enlargement.  . Hyperlipidemia   . Ischemic cardiomyopathy    a. 08/2016 Echo: EF 35%.  . Moderate to Severe Mitral regurgitation    a. 08/2016 Echo: Mod-sev MR.  Marland Kitchen Permanent atrial fibrillation ([place])    a. Chronic coumadin (CHA2DS2VASc = 5).  . Pulmonary nodules    Noted on abdominal CT  . Statin intolerance       Surgical History:  Past Surgical History:  Procedure Laterality Date  . ABDOMINAL AORTIC ANEURYSM REPAIR     12/02/2007 [facility]  . ABDOMINAL AORTIC ANEURYSM REPAIR  2006   [facility]   . CARDIAC CATHETERIZATION  2009   [facility]  . CORONARY ANGIOPLASTY WITH STENT PLACEMENT  2005   Cypher stent LAD   . Cypher stents to LAD     01/25/2004  . SKIN SURGERY  2016   [facility] skin cancer     Home Meds: Prior to Admission medications   Medication Sig Start Date End Date Taking? Authorizing Provider  Bioflavonoid Products (BIOFLEX PO) Take 1 tablet by mouth daily.     [provider]  Cetirizine HCl (ZYRTEC ALLERGY) 10 MG CAPS Take 1 capsule by mouth daily.     [provider]  digoxin (LANOXIN) 0.125 MG tablet Take 1 tablet (0.125 mg total) by mouth daily. 08/09/15 08/08/16  Margarita Rana, MD  furosemide (LASIX) 20 MG tablet Take 20 mg by mouth daily.  09/30/11   [provider]  gabapentin (NEURONTIN) 100 MG capsule Take by mouth. Reported on 10/06/2015 12/25/13   [provider]  lisinopril (PRINIVIL,ZESTRIL) 2.5 MG tablet Take 2.5 mg by mouth daily.  01/20/14   [provider]  metoprolol succinate (TOPROL-XL) 25 MG 24 hr tablet Take 25 mg by mouth daily.    [provider]  niacin (NIASPAN) 500 MG CR tablet  Take 500 mg by mouth at bedtime.     [provider]  Omega-3 Fatty Acids (FISH OIL) 1200 MG CAPS Take 1,200 mg by mouth daily.    [provider]  omeprazole (PRILOSEC) 20 MG capsule TAKE 1 CAPSULE BY MOUTH  DAILY 07/04/16   Jerrol Banana., MD  warfarin (COUMADIN) 2 MG tablet Take 1 tablet (2 mg total) by mouth daily. 06/05/16   Jerrol Banana., MD  warfarin (COUMADIN) 2.5 MG tablet Take 1 tablet (2.5 mg total) by mouth daily. 11/10/16   Jerrol Banana., MD    Allergies:  Allergies  Allergen Reactions  . Atorvastatin   . Simvastatin     Social History   Social History  . Marital status: Married    Spouse name: N/A  . Number of children: 1  . Years of education: N/A   Occupational History  . Not on file.    Social History Main Topics  . Smoking status: Former Smoker    Quit date: 12/01/1987  . Smokeless tobacco: Never Used  . Alcohol use No  . Drug use: No  . Sexual activity: Not on file   Other Topics Concern  . Not on file   Social History Narrative  . No narrative on file     Family History  Problem Relation Age of Onset  . Heart attack Father 30       Died w/ Heart attack  . Diabetes Father   . Heart attack Brother   . Heart disease Brother   . Diabetes Sister   . Heart disease Sister      ROS:  Please see the history of present illness.     All other systems reviewed and negative.    Physical Exam:  Blood pressure 126/84, pulse 61, height 6' 1.5" (1.867 m), weight 197 lb 8 oz (89.6 kg). Well developed and nourished in no acute distress HENT normal Neck supple with JVP-flat Carotids brisk and full without bruits Clear Irregularly irregular rate and rhythm, 2/6 m Apex  Abd-soft with active BS without hepatomegaly No Clubbing cyanosis edema Skin-warm and dry A & Oriented  Grossly normal sensory and motor function     Labs: Cardiac Enzymes No results for input(s): CKTOTAL, CKMB, TROPONINI in the last 72 hours. CBC Lab Results  Component Value Date   WBC 5.2 06/05/2016   HGB 14.0 06/05/2016   HCT 43.2 06/05/2016   MCV 95 06/05/2016   PLT 154 06/05/2016   PROTIME: No results for input(s): LABPROT, INR in the last 72 hours. Chemistry No results for input(s): NA, K, CL, CO2, BUN, CREATININE, CALCIUM, PROT, BILITOT, ALKPHOS, ALT, AST, GLUCOSE in the last 168 hours.  Invalid input(s): LABALBU Lipids Lab Results  Component Value Date   CHOL 223 (H) 06/05/2016   HDL 35 (L) 06/05/2016   LDLCALC 154 (H) 06/05/2016   TRIG 170 (H) 06/05/2016   BNP No results found for: PROBNP Thyroid Function Tests: No results for input(s): TSH, T4TOTAL, T3FREE, THYROIDAB in the last 72 hours.  Invalid input(s): FREET3 Miscellaneous No results found for:  DDIMER  Radiology/Studies:  No results found.  EKG:  Atrial fibrillation at 79 Intervals-/15/43 IVCD with a left bundle branch like appearance    Assessment and Plan:  Atrial fibrillation-permanent  Mitral regurgitation-moderate-severe  Chronic systolic heart failure  Ischemic heart disease with prior stenting  Statin intolerance   On Anticoagulation;  No bleeding issues   Without symptoms of ischemia  Euvolemic continue current meds  No statins Will check dig, bmet (K) and CBC today    Virl Axe

## 2017-04-26 NOTE — Patient Instructions (Signed)
Medication Instructions:  Your physician recommends that you continue on your current medications as directed. Please refer to the Current Medication list given to you today.   Labwork: Your physician recommends that you return for lab work in: [place] (BMET, CBC, DIGOXIN.) - [facility] You will check in at the front desk to the right as you walk into the atrium. Valet Parking is offered if needed.    Testing/Procedures: none  Follow-Up: Your physician wants you to follow-up in: [place]. You will receive a reminder letter in the mail two months in advance. If you don't receive a letter, please call our office to schedule the follow-up appointment.   If you need a refill on your cardiac medications before your next appointment, please call your pharmacy.

## 2017-04-30 ENCOUNTER — Telehealth: Payer: Self-pay

## 2017-04-30 DIAGNOSIS — I255 Ischemic cardiomyopathy: Secondary | ICD-10-CM

## 2017-04-30 MED ORDER — DIGOXIN 125 MCG PO TABS: 0.1250 mg | ORAL_TABLET | ORAL | 3 refills | Status: DC

## 2017-04-30 NOTE — Telephone Encounter (Signed)
Pt is aware and agreable to lab results. He is agreeable to taking Dig every other day and increasing PO fluids. Per patients request will fax records to PCP.

## 2017-04-30 NOTE — Telephone Encounter (Signed)
lmtcb for lab results

## 2017-05-09 ENCOUNTER — Ambulatory Visit (INDEPENDENT_AMBULATORY_CARE_PROVIDER_SITE_OTHER): Payer: Medicare Other | Admitting: Family Medicine

## 2017-05-09 VITALS — BP 118/60 | HR 72 | Temp 97.4°F | Resp 16 | Wt 195.0 lb

## 2017-05-09 DIAGNOSIS — I482 Chronic atrial fibrillation, unspecified: Secondary | ICD-10-CM

## 2017-05-09 DIAGNOSIS — I10 Essential (primary) hypertension: Secondary | ICD-10-CM | POA: Diagnosis not present

## 2017-05-09 DIAGNOSIS — I951 Orthostatic hypotension: Secondary | ICD-10-CM

## 2017-05-09 LAB — PROTIME-INR
INR: 2.4 — AB
Prothrombin Time: 25.2 s — ABNORMAL HIGH (ref 9.0–11.5)

## 2017-05-09 NOTE — Progress Notes (Signed)
Patient: Joseph Barton Male    DOB: 1927/03/19   83 y.o.   MRN: [id number] Visit Date: 05/09/2017  Today's Provider: Wilhemena Durie, MD   Chief Complaint  Patient presents with  . Gait Problem   Subjective:    HPI  Pt is here today for an episode of unsteadiness on his feet that he and his wife are worried about. He reports that 2 days ago he was sitting in his chair and was reclined. He got up and his gait was so unsteady that he fell in to the chair that was next to him. He reports that he was not dizzy and his wife reports that he was cognitively ok. He denies any dizziness, headache, fatigue, no problems speaking or weakness. Pt reports that he thinks it was from where he was laying down and his legs were bent and the circulation was cut off from his feet. No other episodes since this one.      Allergies  Allergen Reactions  . Atorvastatin   . Simvastatin      Current Outpatient Medications:  .  Bioflavonoid Products (BIOFLEX PO), Take 1 tablet by mouth daily. , Disp: , Rfl:  .  digoxin (LANOXIN) 0.125 MG tablet, Take 1 tablet (0.125 mg total) by mouth every other day., Disp: 90 tablet, Rfl: 3 .  furosemide (LASIX) 20 MG tablet, Take 2 tablets (40 mg total) by mouth daily. Take 2 tablets (40 mg) by mouth once daily, Disp: 180 tablet, Rfl: 3 .  lisinopril (PRINIVIL,ZESTRIL) 2.5 MG tablet, Take 1 tablet (2.5 mg total) by mouth daily., Disp: 90 tablet, Rfl: 3 .  metoprolol succinate (TOPROL-XL) 25 MG 24 hr tablet, Take 1 tablet (25 mg total) by mouth daily., Disp: 90 tablet, Rfl: 3 .  omeprazole (PRILOSEC) 20 MG capsule, TAKE 1 CAPSULE BY MOUTH  DAILY, Disp: 90 capsule, Rfl: 3 .  warfarin (COUMADIN) 2.5 MG tablet, Take 1 tablet (2.5 mg total) by mouth daily. (Patient taking differently: Take 2.5 mg by mouth as directed. ), Disp: 90 tablet, Rfl: 3  Review of Systems  Constitutional: Negative.   HENT: Negative.   Eyes: Negative.   Respiratory: Negative.     Cardiovascular: Negative.   Gastrointestinal: Negative.   Endocrine: Negative.   Genitourinary: Negative.   Musculoskeletal: Positive for gait problem.  Skin: Negative.   Allergic/Immunologic: Negative.   Hematological: Negative.   Psychiatric/Behavioral: Negative.     Social History   Tobacco Use  . Smoking status: Former Smoker    Last attempt to quit: 12/01/1987    Years since quitting: 29.4  . Smokeless tobacco: Never Used  Substance Use Topics  . Alcohol use: No   Objective:   BP 118/60 (BP Location: Left Arm, Patient Position: Sitting, Cuff Size: Normal)   Pulse 72   Temp (!) 97.4 F (36.3 C) (Oral)   Resp 16   Wt 195 lb (88.5 kg)   BMI 25.38 kg/m  Vitals:   05/09/17 1131  BP: 118/60  Pulse: 72  Resp: 16  Temp: (!) 97.4 F (36.3 C)  TempSrc: Oral  Weight: 195 lb (88.5 kg)     Physical Exam  Constitutional: He is oriented to person, place, and time. He appears well-developed and well-nourished.  HENT:  Head: Normocephalic and atraumatic.  Eyes: Conjunctivae are normal. No scleral icterus.  Neck: No thyromegaly present.  Cardiovascular: Normal rate, regular rhythm and normal heart sounds.  Pulmonary/Chest: Effort normal and breath sounds  normal.  Abdominal: Soft.  Neurological: He is alert and oriented to person, place, and time. He exhibits normal muscle tone. Coordination normal.  Nonfocal exam.  Skin: Skin is warm and dry.  Psychiatric: He has a normal mood and affect. His behavior is normal. Judgment and thought content normal.        Assessment & Plan:     1. Chronic atrial fibrillation (HCC)  - INR/PT 2.Unsteady Gait 3.Orthostasis Push fluids and watch BP.      I have done the exam and reviewed the above chart and it is accurate to the best of my knowledge. Development worker, community has been used in this note in any air is in the dictation or transcription are unintentional.  Wilhemena Durie, MD  [place]

## 2017-05-11 ENCOUNTER — Ambulatory Visit: Payer: Self-pay

## 2017-06-08 ENCOUNTER — Ambulatory Visit (INDEPENDENT_AMBULATORY_CARE_PROVIDER_SITE_OTHER): Payer: Medicare Other

## 2017-06-08 DIAGNOSIS — I482 Chronic atrial fibrillation, unspecified: Secondary | ICD-10-CM

## 2017-06-08 DIAGNOSIS — I5022 Chronic systolic (congestive) heart failure: Secondary | ICD-10-CM | POA: Diagnosis not present

## 2017-06-08 LAB — POCT INR
INR: 2.7
PT: 31.8

## 2017-07-06 ENCOUNTER — Ambulatory Visit (INDEPENDENT_AMBULATORY_CARE_PROVIDER_SITE_OTHER): Payer: Medicare Other

## 2017-07-06 DIAGNOSIS — I482 Chronic atrial fibrillation, unspecified: Secondary | ICD-10-CM

## 2017-07-06 DIAGNOSIS — I4821 Permanent atrial fibrillation: Secondary | ICD-10-CM

## 2017-07-06 LAB — POCT INR
INR: 1.6
PT: 19.2

## 2017-07-06 NOTE — Patient Instructions (Signed)
Description   Continue 2.5 mg daily and except 1.25 mg Monday, Wed, Fri F/U 4 weeks

## 2017-08-10 ENCOUNTER — Ambulatory Visit (INDEPENDENT_AMBULATORY_CARE_PROVIDER_SITE_OTHER): Payer: Medicare Other

## 2017-08-10 DIAGNOSIS — I482 Chronic atrial fibrillation, unspecified: Secondary | ICD-10-CM

## 2017-08-10 LAB — POCT INR
INR: 2.2
PT: 26

## 2017-08-10 NOTE — Patient Instructions (Signed)
Description   Continue 2.5 mg daily and except 1.25 mg Monday, Wed, Fri F/U 4 weeks

## 2017-09-07 ENCOUNTER — Ambulatory Visit (INDEPENDENT_AMBULATORY_CARE_PROVIDER_SITE_OTHER): Payer: Medicare Other | Admitting: *Deleted

## 2017-09-07 DIAGNOSIS — I482 Chronic atrial fibrillation, unspecified: Secondary | ICD-10-CM

## 2017-09-07 DIAGNOSIS — I4821 Permanent atrial fibrillation: Secondary | ICD-10-CM

## 2017-09-07 LAB — POCT INR
INR: 2.7
PT: 32

## 2017-09-07 NOTE — Patient Instructions (Signed)
Continue 2.5 mg daily and except 1.25 mg Monday, Wed, Fri F/U 4 weeks

## 2017-10-03 ENCOUNTER — Other Ambulatory Visit: Payer: Self-pay | Admitting: Internal Medicine

## 2017-10-03 ENCOUNTER — Other Ambulatory Visit: Payer: Self-pay | Admitting: Family Medicine

## 2017-10-03 DIAGNOSIS — I4891 Unspecified atrial fibrillation: Secondary | ICD-10-CM

## 2017-10-05 ENCOUNTER — Ambulatory Visit (INDEPENDENT_AMBULATORY_CARE_PROVIDER_SITE_OTHER): Payer: Medicare Other

## 2017-10-05 DIAGNOSIS — I482 Chronic atrial fibrillation, unspecified: Secondary | ICD-10-CM

## 2017-10-05 LAB — POCT INR
INR: 2.7
PT: 32

## 2017-10-05 NOTE — Patient Instructions (Signed)
Description   Continue 2.5 mg daily and except 1.25 mg Monday, Wed, Fri F/U 4 weeks

## 2017-10-10 ENCOUNTER — Ambulatory Visit: Payer: Self-pay

## 2017-11-02 ENCOUNTER — Other Ambulatory Visit: Payer: Self-pay | Admitting: Internal Medicine

## 2017-11-05 ENCOUNTER — Ambulatory Visit
Admission: RE | Admit: 2017-11-05 | Discharge: 2017-11-05 | Disposition: A | Discharge status: Home / Self Care | Payer: Medicare Other | Source: Ambulatory Visit | Attending: Family Medicine | Admitting: Family Medicine

## 2017-11-05 ENCOUNTER — Encounter: Payer: Self-pay | Admitting: Family Medicine

## 2017-11-05 ENCOUNTER — Ambulatory Visit (INDEPENDENT_AMBULATORY_CARE_PROVIDER_SITE_OTHER): Payer: Medicare Other | Admitting: Family Medicine

## 2017-11-05 VITALS — BP 104/56 | HR 66 | Temp 98.5°F | Resp 14 | Wt 200.0 lb

## 2017-11-05 DIAGNOSIS — R918 Other nonspecific abnormal finding of lung field: Secondary | ICD-10-CM | POA: Diagnosis not present

## 2017-11-05 DIAGNOSIS — I482 Chronic atrial fibrillation, unspecified: Secondary | ICD-10-CM

## 2017-11-05 DIAGNOSIS — I509 Heart failure, unspecified: Secondary | ICD-10-CM | POA: Diagnosis not present

## 2017-11-05 DIAGNOSIS — I2510 Atherosclerotic heart disease of native coronary artery without angina pectoris: Secondary | ICD-10-CM

## 2017-11-05 DIAGNOSIS — I255 Ischemic cardiomyopathy: Secondary | ICD-10-CM

## 2017-11-05 DIAGNOSIS — Z9861 Coronary angioplasty status: Secondary | ICD-10-CM

## 2017-11-05 DIAGNOSIS — R0602 Shortness of breath: Secondary | ICD-10-CM | POA: Diagnosis not present

## 2017-11-05 LAB — POCT INR
INR: 2.6
PT: 31.4

## 2017-11-05 NOTE — Telephone Encounter (Signed)
This is a [place] pt 

## 2017-11-05 NOTE — Progress Notes (Signed)
Patient: Joseph Barton Male    DOB: 01-10-1927   84 y.o.   MRN: [id number] Visit Date: 11/05/2017  Today's Provider: Wilhemena Durie, MD   Chief Complaint  Patient presents with  . Hypertension  . Atrial Fibrillation   Subjective:    HPI Pt is here for a routine follow up of his chronic problems. His blood pressure has been running 100's/50's. He reports that he has been having some shortness of breath on exertion. He reports that it is worse when he is carrying stuff. He also reports that he has heard himself wheeze. Pt has been having some allergy symptoms. No chest pain. INR today was 2.6 PT was 31.4     Allergies  Allergen Reactions  . Atorvastatin   . Simvastatin      Current Outpatient Medications:  .  Bioflavonoid Products (BIOFLEX PO), Take 1 tablet by mouth daily. , Disp: , Rfl:  .  digoxin (LANOXIN) 0.125 MG tablet, Take 1 tablet (0.125 mg total) by mouth every other day., Disp: 90 tablet, Rfl: 3 .  furosemide (LASIX) 20 MG tablet, TAKE 2 TABLETS BY MOUTH  ONCE DAILY, Disp: 180 tablet, Rfl: 3 .  lisinopril (PRINIVIL,ZESTRIL) 2.5 MG tablet, TAKE 1 TABLET BY MOUTH  DAILY, Disp: 90 tablet, Rfl: 0 .  metoprolol succinate (TOPROL-XL) 25 MG 24 hr tablet, TAKE 1 TABLET BY MOUTH  DAILY, Disp: 90 tablet, Rfl: 0 .  omeprazole (PRILOSEC) 20 MG capsule, TAKE 1 CAPSULE BY MOUTH  DAILY, Disp: 90 capsule, Rfl: 3 .  warfarin (COUMADIN) 2.5 MG tablet, Take 1 tablet (2.5 mg total) by mouth as directed., Disp: 30 tablet, Rfl: 12  Review of Systems  Constitutional: Negative.   HENT: Positive for postnasal drip, rhinorrhea and sneezing.   Eyes: Negative.   Respiratory: Positive for cough, shortness of breath (with exertion and carrying stuff makes it worse) and wheezing.   Cardiovascular: Negative.        No PND/Orthopnea.  Gastrointestinal: Negative.   Endocrine: Negative.   Genitourinary: Negative.   Musculoskeletal: Negative.   Skin: Negative.   Allergic/Immunologic:  Negative.   Neurological: Negative.   Hematological: Negative.   Psychiatric/Behavioral: Negative.     Social History   Tobacco Use  . Smoking status: Former Smoker    Last attempt to quit: 12/01/1987    Years since quitting: 29.9  . Smokeless tobacco: Never Used  Substance Use Topics  . Alcohol use: No   Objective:   BP (!) 104/56 (BP Location: Left Arm, Patient Position: Sitting, Cuff Size: Normal)   Pulse 66   Temp 98.5 F (36.9 C) (Oral)   Resp 14   Wt 200 lb (90.7 kg)   SpO2 99%   BMI 26.03 kg/m  Vitals:   11/05/17 1009  BP: (!) 104/56  Pulse: 66  Resp: 14  Temp: 98.5 F (36.9 C)  TempSrc: Oral  SpO2: 99%  Weight: 200 lb (90.7 kg)     Physical Exam  Constitutional: He is oriented to person, place, and time. He appears well-developed and well-nourished.  HENT:  Head: Normocephalic and atraumatic.  Eyes: Conjunctivae are normal. No scleral icterus.  Neck: No thyromegaly present.  Cardiovascular: Normal rate, regular rhythm and normal heart sounds.  Pulmonary/Chest: Effort normal and breath sounds normal.  Abdominal: Soft.  Musculoskeletal: He exhibits no edema.  Neurological: He is alert and oriented to person, place, and time.  Skin: Skin is warm and dry.  Psychiatric: He has  a normal mood and affect. His behavior is normal. Judgment and thought content normal.        Assessment & Plan:     1. Chronic atrial fibrillation (HCC)  - POCT INR - Comprehensive metabolic panel - TSH - Troponin I - Pro b natriuretic peptide (BNP) - DG Chest 2 View; Future  2. Shortness of breath  - EKG 12-Lead  3. CAD S/P percutaneous coronary angioplasty  - Comprehensive metabolic panel - TSH - Troponin I - Pro b natriuretic peptide (BNP) - DG Chest 2 View; Future  4. Cardiomyopathy, ischemic   5. Chronic congestive heart failure, unspecified heart failure type (HCC) Possible mild CHF--will follow 1 month. - CBC with Differential/Platelet -  Comprehensive metabolic panel - TSH - Troponin I - Pro b natriuretic peptide (BNP) - DG Chest 2 View; Future 6.Hypotension Stop lisinopril for now.     I have done the exam and reviewed the above chart and it is accurate to the best of my knowledge. Development worker, community has been used in this note in any air is in the dictation or transcription are unintentional.  Wilhemena Durie, MD  [place]

## 2017-11-06 ENCOUNTER — Telehealth: Payer: Self-pay | Admitting: Internal Medicine

## 2017-11-06 LAB — COMPREHENSIVE METABOLIC PANEL
A/G RATIO: 1.5 (ref 1.2–2.2)
ALBUMIN: 4.3 g/dL (ref 3.5–4.7)
ALT: 12 IU/L (ref 0–44)
AST: 27 IU/L (ref 0–40)
Alkaline Phosphatase: 73 IU/L (ref 39–117)
BUN / CREAT RATIO: 15 (ref 10–24)
BUN: 19 mg/dL (ref 8–27)
Bilirubin Total: 1 mg/dL (ref 0.0–1.2)
CO2: 21 mmol/L (ref 20–29)
CREATININE: 1.24 mg/dL (ref 0.76–1.27)
Calcium: 9.2 mg/dL (ref 8.6–10.2)
Chloride: 100 mmol/L (ref 96–106)
GFR, EST AFRICAN AMERICAN: 59 mL/min/{1.73_m2} — AB (ref >59)
GFR, EST NON AFRICAN AMERICAN: 51 mL/min/{1.73_m2} — AB (ref >59)
GLOBULIN, TOTAL: 2.9 g/dL (ref 1.5–4.5)
Glucose: 112 mg/dL — ABNORMAL HIGH (ref 65–99)
Potassium: 4.7 mmol/L (ref 3.5–5.2)
SODIUM: 140 mmol/L (ref 134–144)
TOTAL PROTEIN: 7.2 g/dL (ref 6.0–8.5)

## 2017-11-06 LAB — CBC WITH DIFFERENTIAL/PLATELET
BASOS: 1 %
Basophils Absolute: 0 10*3/uL (ref 0.0–0.2)
EOS (ABSOLUTE): 0.1 10*3/uL (ref 0.0–0.4)
EOS: 2 %
HEMATOCRIT: 41.2 % (ref 37.5–51.0)
HEMOGLOBIN: 13.8 g/dL (ref 13.0–17.7)
Immature Grans (Abs): 0 10*3/uL (ref 0.0–0.1)
Immature Granulocytes: 0 %
Lymphocytes Absolute: 1.4 10*3/uL (ref 0.7–3.1)
Lymphs: 27 %
MCH: 31.4 pg (ref 26.6–33.0)
MCHC: 33.5 g/dL (ref 31.5–35.7)
MCV: 94 fL (ref 79–97)
MONOCYTES: 11 %
MONOS ABS: 0.6 10*3/uL (ref 0.1–0.9)
Neutrophils Absolute: 3 10*3/uL (ref 1.4–7.0)
Neutrophils: 59 %
Platelets: 154 10*3/uL (ref 150–379)
RBC: 4.4 x10E6/uL (ref 4.14–5.80)
RDW: 15.3 % (ref 12.3–15.4)
WBC: 5.1 10*3/uL (ref 3.4–10.8)

## 2017-11-06 LAB — TSH: TSH: 5.77 u[IU]/mL — AB (ref 0.450–4.500)

## 2017-11-06 LAB — PRO B NATRIURETIC PEPTIDE: NT-Pro BNP: 2359 pg/mL — ABNORMAL HIGH (ref 0–486)

## 2017-11-06 LAB — TROPONIN I: Troponin I: 0.02 ng/mL (ref 0.00–0.04)

## 2017-11-06 NOTE — Telephone Encounter (Signed)
-----   Message from Anselm Pancoast, [place] sent at 11/05/2017  9:38 AM EDT ----- Please contact patient for a follow up appointment. He was to come in April 2019.  Thanks, Ivin Booty

## 2017-11-06 NOTE — Telephone Encounter (Signed)
Lmov for patient to call back ° °

## 2017-11-07 NOTE — Telephone Encounter (Signed)
Patient scheduled for 5/16

## 2017-11-07 NOTE — Telephone Encounter (Signed)
Lmov for patient to call back ° °

## 2017-11-08 ENCOUNTER — Telehealth: Payer: Self-pay

## 2017-11-08 NOTE — Telephone Encounter (Signed)
Pt wife advised. Pt is taking 40 mg of lasix. She wanted to to double check and make sure he was suppose to take 80 mg daily. Please advise. Thanks.

## 2017-11-08 NOTE — Telephone Encounter (Signed)
Yes.  Double whatever dose he is currently taking.  Can take 40mg  BID instead of 80mg  all at once.  Virginia Crews, MD, MPH [facility] 11/08/2017 2:37 PM

## 2017-11-08 NOTE — Telephone Encounter (Signed)
-----   Message from Jerrol Banana., MD sent at 11/06/2017  2:31 PM EDT ----- CXR ok but possible mild CHF on labs--double lasix for 1 week.

## 2017-11-08 NOTE — Telephone Encounter (Signed)
Pt advised.

## 2017-11-08 NOTE — Telephone Encounter (Signed)
Left message to call back  

## 2017-11-15 ENCOUNTER — Ambulatory Visit: Payer: Medicare Other | Admitting: Internal Medicine

## 2017-11-15 ENCOUNTER — Encounter: Payer: Self-pay | Admitting: Internal Medicine

## 2017-11-15 VITALS — BP 110/64 | HR 88 | Ht 73.5 in | Wt 194.2 lb

## 2017-11-15 DIAGNOSIS — I340 Nonrheumatic mitral (valve) insufficiency: Secondary | ICD-10-CM

## 2017-11-15 DIAGNOSIS — I4821 Permanent atrial fibrillation: Secondary | ICD-10-CM

## 2017-11-15 DIAGNOSIS — I5022 Chronic systolic (congestive) heart failure: Secondary | ICD-10-CM | POA: Diagnosis not present

## 2017-11-15 DIAGNOSIS — Z79899 Other long term (current) drug therapy: Secondary | ICD-10-CM

## 2017-11-15 DIAGNOSIS — I482 Chronic atrial fibrillation: Secondary | ICD-10-CM | POA: Diagnosis not present

## 2017-11-15 NOTE — Patient Instructions (Signed)
Medication Instructions:  Your physician recommends that you continue on your current medications as directed. Please refer to the Current Medication list given to you today.  We will plan to add LOSARTAN if your BMET is ok.   Labwork: Your physician recommends that you return for lab work in: [place] (BMET, DIGOXIN.)   Testing/Procedures: none  Follow-Up: Your physician wants you to follow-up in: [place]. You will receive a reminder letter in the mail two months in advance. If you don't receive a letter, please call our office to schedule the follow-up appointment.   If you need a refill on your cardiac medications before your next appointment, please call your pharmacy.

## 2017-11-15 NOTE — Progress Notes (Signed)
Patient Care Team: Jerrol Banana., MD as PCP - General (Family Medicine) Lorelee Cover., MD as Consulting Physician (Ophthalmology) Deboraha Sprang, MD as Consulting Physician (Cardiology)   HPI  Joseph Barton is a 84 y.o. male   He underwent Myoview scanning April 2013 demonstrating an ejection fraction of 13%. He has been on beta blockers and ACE inhibitors for a long time. Echocardiogram confirmed depressed left ventricular function moderate MR and TR and there was no evidence of ischemia on his Myoview Echo 2018 EF 35% ([place]) with mod-severe MR.  DATE TEST EF   4/13 Myoview  13 %   08/2016 Echo   35 % MR mod-sev//LAE mod            Date Cr K DIG Hgb  3/18 1.4  1.3 (12/17) 15.2  6/18 1.03 4.6    10/18   1.1   5/19 1.24 4.7  13.8         He has shortness of breath with exertion.  This is particularly aggravated with carrying.  He has not had peripheral edema.  He denies chest pain.  He also is really frustrated by the fact that he has no feeling in his feet.  This lends to instability of gait.  He his PCP told him he had neuropathy.  More remotely he was on gabapentin but is not currently.  He had tolerated it previously    Past Medical History:  Diagnosis Date  . Abdominal aortic aneurysm ([place]) 01-2004   4.7 x 4.7 cm.  Marland Kitchen CAD (coronary artery disease) 01/25/2004   a. 01/2004 Ant MI/DES to LAD;  b. 10/2011 Neg MV.  . Chronic systolic heart failure ([place])    a. 08/2016 Echo: EF 35%, nl RV fxn, mod to sev MR, mild to mod TR, mod biatrial enlargement.  . Hyperlipidemia   . Ischemic cardiomyopathy    a. 08/2016 Echo: EF 35%.  . Moderate to Severe Mitral regurgitation    a. 08/2016 Echo: Mod-sev MR.  Marland Kitchen Permanent atrial fibrillation ([place])    a. Chronic coumadin (CHA2DS2VASc = 5).  . Pulmonary nodules    Noted on abdominal CT  . Statin intolerance     Past Surgical History:  Procedure Laterality Date  . ABDOMINAL AORTIC ANEURYSM REPAIR     12/02/2007  [facility]  . ABDOMINAL AORTIC ANEURYSM REPAIR  2006   [facility]   . CARDIAC CATHETERIZATION  2009   [facility]  . CORONARY ANGIOPLASTY WITH STENT PLACEMENT  2005   Cypher stent LAD   . Cypher stents to LAD     01/25/2004  . SKIN SURGERY  2016   [facility] skin cancer    Current Meds  Medication Sig  . Bioflavonoid Products (BIOFLEX PO) Take 1 tablet by mouth daily.   . digoxin (LANOXIN) 0.125 MG tablet Take 1 tablet (0.125 mg total) by mouth every other day.  . furosemide (LASIX) 20 MG tablet TAKE 2 TABLETS BY MOUTH  ONCE DAILY  . metoprolol succinate (TOPROL-XL) 25 MG 24 hr tablet TAKE 1 TABLET BY MOUTH  DAILY  . omeprazole (PRILOSEC) 20 MG capsule TAKE 1 CAPSULE BY MOUTH  DAILY  . warfarin (COUMADIN) 2.5 MG tablet Take 1 tablet (2.5 mg total) by mouth as directed.    Allergies  Allergen Reactions  . Atorvastatin   . Simvastatin       Review of Systems negative except from HPI and PMH  Physical Exam BP 110/64 (  BP Location: Left Arm, Patient Position: Sitting, Cuff Size: Normal)   Pulse 88   Ht 6' 1.5" (1.867 m)   Wt 194 lb 4 oz (88.1 kg)   BMI 25.28 kg/m  Well developed and nourished in no acute distress HENT normal Neck supple with JVP-flat Carotids brisk and full without bruits Clear Irregularly irregular rate and rhythm with controlled ventricular response, no murmurs or gallops Abd-soft with active BS without hepatomegaly No Clubbing cyanosis edema Skin-warm and dry A & Oriented  Grossly normal sensory and motor function but complaining of poor feeling in his feet    ECG personally reviewed Intervals-/14/43 IVCD-left bundle-like  Assessment and  Plan  Atrial fibrillation-permanent  Mitral regurgitation-moderate-severe  Chronic systolic heart failure  Ischemic heart disease with prior stenting  Congestive heart failure-acute on chronic  Sensory neuropathy   The patient is much improved following Dr. Rosanna Randy up titration of his diuretics.  We will  check his metabolic profile.  In the event that it is okay, we will add an ARB at low dose.  His blood pressure is not all that high.  We will need to follow-up for symptoms of lightheadedness as well as changes in renal function.  For now, we will continue him on his twice daily diuretics.  We will check his digoxin level.  His second biggest complaint was sensory neuropathy.  I have reached out to Dr. Erlinda Hong who confirmed that gabapentin may be of some benefit.  I suggested that he follow-up with his primary care for neurological referral  We spent more than 50% of our >25 min visit in face to face counseling regarding the above   Current medicines are reviewed at length with the patient today .  The patient does not  have concerns regarding medicines.

## 2017-11-16 LAB — BASIC METABOLIC PANEL
BUN / CREAT RATIO: 19 (ref 10–24)
BUN: 25 mg/dL (ref 8–27)
CALCIUM: 9.7 mg/dL (ref 8.6–10.2)
CHLORIDE: 98 mmol/L (ref 96–106)
CO2: 25 mmol/L (ref 20–29)
Creatinine, Ser: 1.31 mg/dL — ABNORMAL HIGH (ref 0.76–1.27)
GFR calc non Af Amer: 48 mL/min/{1.73_m2} — ABNORMAL LOW (ref >59)
GFR, EST AFRICAN AMERICAN: 55 mL/min/{1.73_m2} — AB (ref >59)
Glucose: 87 mg/dL (ref 65–99)
POTASSIUM: 4.1 mmol/L (ref 3.5–5.2)
SODIUM: 140 mmol/L (ref 134–144)

## 2017-11-16 LAB — DIGOXIN LEVEL: Digoxin, Serum: 0.9 ng/mL (ref 0.5–0.9)

## 2017-11-29 ENCOUNTER — Telehealth: Payer: Self-pay

## 2017-11-29 MED ORDER — ALBUTEROL SULFATE HFA 108 (90 BASE) MCG/ACT IN AERS: 2.0000 | INHALATION_SPRAY | Freq: Four times a day (QID) | RESPIRATORY_TRACT | 0 refills | Status: AC | PRN

## 2017-11-29 NOTE — Telephone Encounter (Signed)
Have sent in medication as below. Have left a message to call back and schedule an appt for a follow up.

## 2017-11-29 NOTE — Telephone Encounter (Signed)
Albuterol MDI every 6 hours as needed. Pt should have appt in next week or so.

## 2017-11-29 NOTE — Telephone Encounter (Signed)
Advised wife, and patient has an appt scheduled for 6/13.   She wanted to know can we send in Lasix 80mg  into the mail order pharmacy instead of the patient having to take (4) 20mg  tablets? Please advise. Thanks!

## 2017-11-29 NOTE — Telephone Encounter (Signed)
Advised wife as below.  

## 2017-11-29 NOTE — Telephone Encounter (Signed)
Patient's wife called saying that the patient was unable to sleep last night due to his shortness of breath. She reports that they have tried a recliner and extra pillows and nothing has helped. She reports that the patient is not in any distress, and last night was his worse night. He has had ongoing symptoms for about 1 month. He was seen in the office on 11/05/17 and was advised to increase Lasix to a total of 80mg  daily, and to discontinue Lisinopril.   She reports that the patient's shortness of breath did improve, and he no longer has swelling in his legs and ankles. She is requesting an inhaler to help with the patient's breathing. She feels that this will "open up his lungs". Ok to send in an inhaler? Please advise. Patient uses [facility]. Thanks!

## 2017-11-29 NOTE — Telephone Encounter (Signed)
I would wait until appt as we might be changing dose--probably will.

## 2017-12-04 ENCOUNTER — Telehealth: Payer: Self-pay

## 2017-12-04 ENCOUNTER — Emergency Department: Payer: Medicare Other

## 2017-12-04 ENCOUNTER — Other Ambulatory Visit: Payer: Self-pay

## 2017-12-04 ENCOUNTER — Ambulatory Visit: Payer: Self-pay | Admitting: Family Medicine

## 2017-12-04 ENCOUNTER — Inpatient Hospital Stay
Admission: EM | Admit: 2017-12-04 | Discharge: 2017-12-06 | DRG: 291 | Disposition: A | Discharge status: Home / Self Care | Payer: Medicare Other | Attending: Specialist | Admitting: Specialist

## 2017-12-04 ENCOUNTER — Encounter: Payer: Self-pay | Admitting: Emergency Medicine

## 2017-12-04 DIAGNOSIS — I4891 Unspecified atrial fibrillation: Secondary | ICD-10-CM | POA: Diagnosis not present

## 2017-12-04 DIAGNOSIS — N183 Chronic kidney disease, stage 3 (moderate): Secondary | ICD-10-CM | POA: Diagnosis present

## 2017-12-04 DIAGNOSIS — E876 Hypokalemia: Secondary | ICD-10-CM | POA: Diagnosis present

## 2017-12-04 DIAGNOSIS — I4821 Permanent atrial fibrillation: Secondary | ICD-10-CM

## 2017-12-04 DIAGNOSIS — I340 Nonrheumatic mitral (valve) insufficiency: Secondary | ICD-10-CM | POA: Diagnosis not present

## 2017-12-04 DIAGNOSIS — R0602 Shortness of breath: Secondary | ICD-10-CM | POA: Diagnosis not present

## 2017-12-04 DIAGNOSIS — I248 Other forms of acute ischemic heart disease: Secondary | ICD-10-CM | POA: Diagnosis not present

## 2017-12-04 DIAGNOSIS — I5023 Acute on chronic systolic (congestive) heart failure: Secondary | ICD-10-CM | POA: Diagnosis not present

## 2017-12-04 DIAGNOSIS — Z87891 Personal history of nicotine dependence: Secondary | ICD-10-CM

## 2017-12-04 DIAGNOSIS — Z955 Presence of coronary angioplasty implant and graft: Secondary | ICD-10-CM | POA: Diagnosis not present

## 2017-12-04 DIAGNOSIS — I509 Heart failure, unspecified: Secondary | ICD-10-CM

## 2017-12-04 DIAGNOSIS — I130 Hypertensive heart and chronic kidney disease with heart failure and stage 1 through stage 4 chronic kidney disease, or unspecified chronic kidney disease: Principal | ICD-10-CM | POA: Diagnosis present

## 2017-12-04 DIAGNOSIS — I482 Chronic atrial fibrillation: Secondary | ICD-10-CM | POA: Diagnosis not present

## 2017-12-04 DIAGNOSIS — Z7901 Long term (current) use of anticoagulants: Secondary | ICD-10-CM

## 2017-12-04 DIAGNOSIS — I2510 Atherosclerotic heart disease of native coronary artery without angina pectoris: Secondary | ICD-10-CM | POA: Diagnosis present

## 2017-12-04 DIAGNOSIS — Z85828 Personal history of other malignant neoplasm of skin: Secondary | ICD-10-CM

## 2017-12-04 DIAGNOSIS — Z79899 Other long term (current) drug therapy: Secondary | ICD-10-CM

## 2017-12-04 DIAGNOSIS — E785 Hyperlipidemia, unspecified: Secondary | ICD-10-CM | POA: Diagnosis present

## 2017-12-04 DIAGNOSIS — K219 Gastro-esophageal reflux disease without esophagitis: Secondary | ICD-10-CM | POA: Diagnosis present

## 2017-12-04 DIAGNOSIS — R079 Chest pain, unspecified: Secondary | ICD-10-CM

## 2017-12-04 DIAGNOSIS — R0789 Other chest pain: Secondary | ICD-10-CM | POA: Diagnosis not present

## 2017-12-04 DIAGNOSIS — F419 Anxiety disorder, unspecified: Secondary | ICD-10-CM | POA: Diagnosis present

## 2017-12-04 DIAGNOSIS — I5022 Chronic systolic (congestive) heart failure: Secondary | ICD-10-CM | POA: Diagnosis not present

## 2017-12-04 DIAGNOSIS — Z8249 Family history of ischemic heart disease and other diseases of the circulatory system: Secondary | ICD-10-CM

## 2017-12-04 DIAGNOSIS — I255 Ischemic cardiomyopathy: Secondary | ICD-10-CM | POA: Diagnosis present

## 2017-12-04 DIAGNOSIS — I5021 Acute systolic (congestive) heart failure: Secondary | ICD-10-CM

## 2017-12-04 LAB — PROTIME-INR
INR: 2.3
PROTHROMBIN TIME: 25.1 s — AB (ref 11.4–15.2)

## 2017-12-04 LAB — BASIC METABOLIC PANEL
Anion gap: 11 (ref 5–15)
BUN: 24 mg/dL — AB (ref 6–20)
CALCIUM: 8.8 mg/dL — AB (ref 8.9–10.3)
CHLORIDE: 105 mmol/L (ref 101–111)
CO2: 24 mmol/L (ref 22–32)
CREATININE: 1.37 mg/dL — AB (ref 0.61–1.24)
GFR, EST AFRICAN AMERICAN: 51 mL/min — AB (ref >60)
GFR, EST NON AFRICAN AMERICAN: 44 mL/min — AB (ref >60)
Glucose, Bld: 164 mg/dL — ABNORMAL HIGH (ref 65–99)
Potassium: 3.4 mmol/L — ABNORMAL LOW (ref 3.5–5.1)
Sodium: 140 mmol/L (ref 135–145)

## 2017-12-04 LAB — TROPONIN I: Troponin I: <0.03 ng/mL (ref <0.03)

## 2017-12-04 LAB — CBC
HCT: 40.6 % (ref 40.0–52.0)
Hemoglobin: 13.8 g/dL (ref 13.0–18.0)
MCH: 32.3 pg (ref 26.0–34.0)
MCHC: 34.1 g/dL (ref 32.0–36.0)
MCV: 94.8 fL (ref 80.0–100.0)
PLATELETS: 137 10*3/uL — AB (ref 150–440)
RBC: 4.28 MIL/uL — AB (ref 4.40–5.90)
RDW: 15.3 % — AB (ref 11.5–14.5)
WBC: 5.1 10*3/uL (ref 3.8–10.6)

## 2017-12-04 LAB — BRAIN NATRIURETIC PEPTIDE: B NATRIURETIC PEPTIDE 5: 633 pg/mL — AB (ref 0.0–100.0)

## 2017-12-04 LAB — MAGNESIUM: Magnesium: 1.8 mg/dL (ref 1.7–2.4)

## 2017-12-04 LAB — TSH: TSH: 7.349 u[IU]/mL — AB (ref 0.350–4.500)

## 2017-12-04 MED ORDER — METOPROLOL SUCCINATE ER 25 MG PO TB24
25.0000 mg | ORAL_TABLET | Freq: Every day | ORAL | Status: DC
  Administered 2017-12-05 – 2017-12-06 (×2): 25 mg via ORAL
  Filled 2017-12-04 (×2): qty 1

## 2017-12-04 MED ORDER — FUROSEMIDE 10 MG/ML IJ SOLN
20.0000 mg | Freq: Two times a day (BID) | INTRAMUSCULAR | Status: DC
  Administered 2017-12-04 – 2017-12-05 (×3): 20 mg via INTRAVENOUS
  Filled 2017-12-04 (×3): qty 2

## 2017-12-04 MED ORDER — WARFARIN - PHARMACIST DOSING INPATIENT: Freq: Every day | Status: DC

## 2017-12-04 MED ORDER — ACETAMINOPHEN 325 MG PO TABS
650.0000 mg | ORAL_TABLET | ORAL | Status: DC | PRN
  Administered 2017-12-05: 650 mg via ORAL
  Filled 2017-12-04: qty 2

## 2017-12-04 MED ORDER — DIGOXIN 125 MCG PO TABS: 0.1250 mg | ORAL_TABLET | ORAL | Status: DC

## 2017-12-04 MED ORDER — MORPHINE SULFATE (PF) 2 MG/ML IV SOLN: 2.0000 mg | INTRAVENOUS | Status: DC | PRN

## 2017-12-04 MED ORDER — ASPIRIN EC 81 MG PO TBEC
81.0000 mg | DELAYED_RELEASE_TABLET | Freq: Every day | ORAL | Status: DC
  Administered 2017-12-05 – 2017-12-06 (×2): 81 mg via ORAL
  Filled 2017-12-04 (×2): qty 1

## 2017-12-04 MED ORDER — SODIUM CHLORIDE 0.9% FLUSH: 3.0000 mL | INTRAVENOUS | Status: DC | PRN

## 2017-12-04 MED ORDER — ASPIRIN 81 MG PO CHEW
324.0000 mg | CHEWABLE_TABLET | Freq: Once | ORAL | Status: AC
  Administered 2017-12-04: 324 mg via ORAL
  Filled 2017-12-04: qty 4

## 2017-12-04 MED ORDER — ONDANSETRON HCL 4 MG/2ML IJ SOLN: 4.0000 mg | Freq: Four times a day (QID) | INTRAMUSCULAR | Status: DC | PRN

## 2017-12-04 MED ORDER — ALBUTEROL SULFATE HFA 108 (90 BASE) MCG/ACT IN AERS: 2.0000 | INHALATION_SPRAY | Freq: Four times a day (QID) | RESPIRATORY_TRACT | Status: DC | PRN

## 2017-12-04 MED ORDER — SODIUM CHLORIDE 0.9 % IV SOLN: 250.0000 mL | INTRAVENOUS | Status: DC | PRN

## 2017-12-04 MED ORDER — NITROGLYCERIN 0.4 MG SL SUBL: 0.4000 mg | SUBLINGUAL_TABLET | SUBLINGUAL | Status: DC | PRN

## 2017-12-04 MED ORDER — POTASSIUM CHLORIDE 20 MEQ PO PACK
40.0000 meq | PACK | Freq: Once | ORAL | Status: AC
  Administered 2017-12-04: 40 meq via ORAL
  Filled 2017-12-04: qty 2

## 2017-12-04 MED ORDER — LISINOPRIL 5 MG PO TABS
2.5000 mg | ORAL_TABLET | Freq: Every day | ORAL | Status: DC
  Administered 2017-12-05 – 2017-12-06 (×2): 2.5 mg via ORAL
  Filled 2017-12-04 (×3): qty 1

## 2017-12-04 MED ORDER — DIGOXIN 125 MCG PO TABS
0.1250 mg | ORAL_TABLET | ORAL | Status: DC
  Filled 2017-12-04: qty 1

## 2017-12-04 MED ORDER — WARFARIN SODIUM 2.5 MG PO TABS
2.5000 mg | ORAL_TABLET | Freq: Once | ORAL | Status: DC
  Filled 2017-12-04: qty 1

## 2017-12-04 MED ORDER — SODIUM CHLORIDE 0.9% FLUSH
3.0000 mL | Freq: Two times a day (BID) | INTRAVENOUS | Status: DC
  Administered 2017-12-04 – 2017-12-06 (×5): 3 mL via INTRAVENOUS

## 2017-12-04 MED ORDER — WARFARIN SODIUM 2.5 MG PO TABS: 2.5000 mg | ORAL_TABLET | ORAL | Status: DC

## 2017-12-04 MED ORDER — NITROGLYCERIN 2 % TD OINT
0.5000 [in_us] | TOPICAL_OINTMENT | Freq: Four times a day (QID) | TRANSDERMAL | Status: DC
  Administered 2017-12-04 (×3): 0.5 [in_us] via TOPICAL
  Filled 2017-12-04 (×3): qty 1

## 2017-12-04 MED ORDER — ALPRAZOLAM 0.5 MG PO TABS: 0.2500 mg | ORAL_TABLET | Freq: Two times a day (BID) | ORAL | Status: DC | PRN

## 2017-12-04 MED ORDER — PANTOPRAZOLE SODIUM 40 MG PO TBEC
40.0000 mg | DELAYED_RELEASE_TABLET | Freq: Every day | ORAL | Status: DC
  Administered 2017-12-05 – 2017-12-06 (×2): 40 mg via ORAL
  Filled 2017-12-04 (×2): qty 1

## 2017-12-04 MED ORDER — ALBUTEROL SULFATE (2.5 MG/3ML) 0.083% IN NEBU: 2.5000 mg | INHALATION_SOLUTION | Freq: Four times a day (QID) | RESPIRATORY_TRACT | Status: DC | PRN

## 2017-12-04 NOTE — ED Notes (Signed)
Pt and family updated on plan of care and status for bed assignment. Pt resting at this time. Respirations even and unlabored.

## 2017-12-04 NOTE — ED Notes (Signed)
Pt taken to Xray at this time.

## 2017-12-04 NOTE — Consult Note (Signed)
Cardiology Consultation:   Patient ID: Joseph Barton; [id number]; 22-May-1927   Admit date: 12/04/2017 Date of Consult: 12/04/2017  Primary Care Provider: Jerrol Banana., MD Primary Cardiologist: Jolyn Nap, MD Primary Electrophysiologist:  Jolyn Nap, MD   Patient Profile:   Joseph Barton is a 84 y.o. male with a hx of coronary artery disease status post PCI to the LAD (8182), chronic systolic heart failure with LVEF of 99% complicated by moderate to severe mitral regurgitation, AAA, permanent atrial fibrillation, and hyperlipidemia who is being seen today for the evaluation of shortness of breath, edema, and chest pain at the request of Dr. Jerelyn Charles.  History of Present Illness:   Joseph Barton reports increasing shortness of breath, orthopnea, and leg/abdominal edema over the last 2 to 3 weeks.  There were no clear precipitants for this, but his symptoms have gradually progressed.  Over the last few nights, he has been unable to sleep due to significant shortness of breath.  He typically is able to lie flat without difficulty.  Dyspnea is now present with even minimal exertion or bending over.  Over the last few days, he is also noted intermittent bandlike pain wrapping around his chest.  It lasts for hours without clear precipitants.  It did improve after nitroglycerin patch was placed in the emergency department today.  Over the last 2 weeks, Joseph Barton has gained about 5 pounds.  He reports being compliant with his medications.  Joseph Barton has noted intermittent lightheadedness but has not fallen.  In the emergency department, evaluation was notable for negative troponin x2 and a BNP of 633.  In the emergency department, Joseph Barton also received aspirin.  Lasix has been ordered but not yet administered.  Past Medical History:  Diagnosis Date  . Abdominal aortic aneurysm ([place]) 01-2004   4.7 x 4.7 cm.  Marland Kitchen CAD (coronary artery disease) 01/25/2004   a. 01/2004 Ant MI/DES to LAD;  b. 10/2011 Neg  MV.  . Chronic systolic heart failure ([place])    a. 08/2016 Echo: EF 35%, nl RV fxn, mod to sev MR, mild to mod TR, mod biatrial enlargement.  . Hyperlipidemia   . Ischemic cardiomyopathy    a. 08/2016 Echo: EF 35%.  . Moderate to Severe Mitral regurgitation    a. 08/2016 Echo: Mod-sev MR.  Marland Kitchen Permanent atrial fibrillation ([place])    a. Chronic coumadin (CHA2DS2VASc = 5).  . Pulmonary nodules    Noted on abdominal CT  . Statin intolerance     Past Surgical History:  Procedure Laterality Date  . ABDOMINAL AORTIC ANEURYSM REPAIR     12/02/2007 [facility]  . ABDOMINAL AORTIC ANEURYSM REPAIR  2006   [facility]   . CARDIAC CATHETERIZATION  2009   [facility]  . CORONARY ANGIOPLASTY WITH STENT PLACEMENT  2005   Cypher stent LAD   . Cypher stents to LAD     01/25/2004  . SKIN SURGERY  2016   [facility] skin cancer     Home Medications:  Prior to Admission medications   Medication Sig Start Date Jameel Quant Date Taking? Authorizing Provider  albuterol (PROVENTIL HFA;VENTOLIN HFA) 108 (90 Base) MCG/ACT inhaler Inhale 2 puffs into the lungs every 6 (six) hours as needed for wheezing or shortness of breath. 11/29/17  Yes Jerrol Banana., MD  digoxin (LANOXIN) 0.125 MG tablet Take 1 tablet (0.125 mg total) by mouth every other day. 04/30/17 09/05/18 Yes Deboraha Sprang, MD  furosemide (LASIX) 20 MG tablet TAKE  2 TABLETS BY MOUTH  ONCE DAILY 10/03/17  Yes Deboraha Sprang, MD  metoprolol succinate (TOPROL-XL) 25 MG 24 hr tablet TAKE 1 TABLET BY MOUTH  DAILY 11/05/17  Yes Deboraha Sprang, MD  omeprazole (PRILOSEC) 20 MG capsule TAKE 1 CAPSULE BY MOUTH  DAILY 04/24/17  Yes Jerrol Banana., MD  warfarin (COUMADIN) 2.5 MG tablet Take 1 tablet (2.5 mg total) by mouth as directed. Patient taking differently: Take  tablet (1.25MG ) by mouth Monday, Wednesday, Friday and take 1 tablet (2.5MG ) by mouth Tuesday, Thursday, Saturday and Sunday 10/03/17  Yes Jerrol Banana., MD  Bioflavonoid Products (BIOFLEX PO) Take 1  tablet by mouth daily.     [provider]    Inpatient Medications: Scheduled Meds: . [START ON 12/05/2017] aspirin EC  81 mg Oral Daily  . [START ON 12/05/2017] digoxin  0.125 mg Oral QODAY  . furosemide  20 mg Intravenous BID  . lisinopril  2.5 mg Oral Daily  . [START ON 12/05/2017] metoprolol succinate  25 mg Oral Daily  . nitroGLYCERIN  0.5 inch Topical Q6H  . [START ON 12/05/2017] pantoprazole  40 mg Oral Daily  . potassium chloride  40 mEq Oral Once  . sodium chloride flush  3 mL Intravenous Q12H  . warfarin  2.5 mg Oral ONCE-1800  . Warfarin - Pharmacist Dosing Inpatient   Does not apply q1800   Continuous Infusions: . sodium chloride     PRN Meds: sodium chloride, acetaminophen, albuterol, ALPRAZolam, morphine injection, nitroGLYCERIN, ondansetron (ZOFRAN) IV, sodium chloride flush  Allergies:    Allergies  Allergen Reactions  . Atorvastatin   . Simvastatin     Social History:   Social History   Tobacco Use  . Smoking status: Former Smoker    Last attempt to quit: 12/01/1987    Years since quitting: 30.0  . Smokeless tobacco: Never Used  Substance Use Topics  . Alcohol use: No  . Drug use: No   Family History:   Family History  Problem Relation Age of Onset  . Heart attack Father 1       Died w/ Heart attack  . Diabetes Father   . Heart attack Brother   . Heart disease Brother   . Diabetes Sister   . Heart disease Sister      ROS:  Please see the history of present illness.  All other ROS reviewed and negative.     Physical Exam/Data:   Vitals:   12/04/17 1400 12/04/17 1500 12/04/17 1519 12/04/17 1520  BP: 106/67 100/62 118/84   Pulse: 68 85 67   Resp: (!) 23 19 (!) 22   Temp:   (!) 97.5 F (36.4 C)   TempSrc:   Oral   SpO2: 94% 95% 92%   Weight:    197 lb 6.4 oz (89.5 kg)  Height:    6\' 1"  (1.854 m)   No intake or output data in the 24 hours ending 12/04/17 1637 Filed Weights   12/04/17 0837 12/04/17 1520  Weight: 192 lb (87.1 kg)  197 lb 6.4 oz (89.5 kg)   Body mass index is 26.04 kg/m.  General:  Well nourished, well developed, in no acute distress.  His wife and daughter at the bedside. HEENT: normal Lymph: no adenopathy Neck: BP approximately 8 to 10 cm without HJR. Endocrine:  No thryomegaly Vascular: No carotid bruits; FA pulses 2+ bilaterally without bruits  Cardiac: Heart sounds.  Regular rate and rhythm with 1/6 systolic  murmur loudest at the left lower sternal border.  No rubs or gallops. Lungs: Normal work of breathing with mildly diminished breath sounds at both lung bases. Abd: soft, nontender, no hepatomegaly  Ext: 2+ pitting edema to the proximal calves. Musculoskeletal:  No deformities, BUE and BLE strength normal and equal Skin: Reddish discoloration of both calves noted.  No warmth. Neuro:  CNs 2-12 intact, no focal abnormalities noted Psych:  Normal affect   EKG:  The EKG was personally reviewed and demonstrates: Atrial fibrillation with right bundle branch block and left anterior fascicular block (bifascicular block). Telemetry:  Telemetry was personally reviewed and demonstrates: Atrial fibrillation with normal to slow heart rate response and intermittent PVCs.  Longest pauses 2 seconds.  Relevant CV Studies: Echocardiogram pending.  Laboratory Data:  Chemistry Recent Labs  Lab 12/04/17 0842  NA 140  K 3.4*  CL 105  CO2 24  GLUCOSE 164*  BUN 24*  CREATININE 1.37*  CALCIUM 8.8*  GFRNONAA 44*  GFRAA 51*  ANIONGAP 11    No results for input(s): PROT, ALBUMIN, AST, ALT, ALKPHOS, BILITOT in the last 168 hours. Hematology Recent Labs  Lab 12/04/17 0842  WBC 5.1  RBC 4.28*  HGB 13.8  HCT 40.6  MCV 94.8  MCH 32.3  MCHC 34.1  RDW 15.3*  PLT 137*   Cardiac Enzymes Recent Labs  Lab 12/04/17 0842 12/04/17 1545  TROPONINI <0.03 <0.03   No results for input(s): TROPIPOC in the last 168 hours.  BNP Recent Labs  Lab 12/04/17 0842  BNP 633.0*    DDimer No results for  input(s): DDIMER in the last 168 hours.  Radiology/Studies:  Dg Chest 2 View  Result Date: 12/04/2017 CLINICAL DATA:  Chest pain EXAM: CHEST - 2 VIEW COMPARISON:  Nov 05, 2017 FINDINGS: There is scarring throughout the lungs with fibrotic change in lung bases. The nodular opacity seen in the right base region on prior study are not appreciable currently. There is no appreciable edema or consolidation. Heart is upper normal in size with pulmonary vascularity normal. No adenopathy. There is aortic atherosclerosis. There is a stent in the left anterior descending coronary artery. IMPRESSION: Areas of scarring and fibrosis. No well-defined nodular lesions are appreciable. No consolidation. Heart is upper normal in size. No adenopathy. There is aortic atherosclerosis. There is a stent in the left anterior descending coronary artery. Aortic Atherosclerosis (ICD10-I70.0). Electronically Signed   By: Lowella Grip III M.D.   On: 2020/05/2018 09:26    Assessment and Plan:   Acute on chronic systolic heart failure secondary to ischemic cardiomyopathy Joseph Barton looks volume overloaded on exam.  He reports gaining at least 5 pounds over the last 2 to 3 weeks with accompanying edema and orthopnea.  No clear precipitant identified.  Agree with IV diuresis.  It is reasonable to begin with furosemide 20 mg IV twice daily, gave in the patient's as each, though further escalation may be necessary to achieve a net negative fluid balance of at least 1 to 2 L per 24 hours.  Continue metoprolol succinate and lisinopril.  And intermittent pauses and slower ventricular response, I will hold digoxin pending a digoxin level.  Chest pain with history of coronary artery disease Pain may be related to demand ischemia in the setting of acute decompensated heart failure.  Troponin is negative x2.  No acute ischemic changes noted on EKG.  Continue metoprolol and low-dose aspirin.  Hold warfarin in case invasive procedures  such as cardiac catheterization, are  needed prior to discharge.  Continue nitroglycerin paste.  Hopefully, with diuresis, chest pain will resolve.  Otherwise, ischemia evaluation (likely catheterization a stress test will have a limited clinical utility in the setting of severely reduced LVEF due to ischemic cardiomyopathy) will need to be considered before discharge.  Patient is not on statin given history of intolerance.  Permanent atrial fibrillation Heart rate is normal to low.  I wonder if this could be contributing to some of his dizziness and heart failure symptoms.  Continue metoprolol succinate for now.  Hold digoxin pending digoxin level. Hold warfarin in case invasive procedures are necessary.  Initiate heparin infusion for bridging once INR falls below 2.  Chronic kidney disease stage III Creatinine at or slightly above baseline.  Close renal function monitoring in the setting of ongoing diuresis.  Avoid nephrotoxic drugs.  For questions or updates, please contact [place] Please consult [email or web address] for contact info under [facility].   Signed, Nelva Bush, MD  12/04/2017 4:37 PM

## 2017-12-04 NOTE — Progress Notes (Signed)
Family Meeting Note  Advance Directive:yes  Today a meeting took place with the Patient.  Patient is able to participate due    The following clinical team members were present during this meeting:MD  The following were discussed:Patient's diagnosis: A. fib, coronary artery disease, heart failure, severe mitral regurgitation, Patient's progosis: Unable to determine and Goals for treatment: DNI  Additional follow-up to be provided: prn  Time spent during discussion:20 minutes  Gorden Harms, MD

## 2017-12-04 NOTE — Progress Notes (Signed)
Pt experienced 2.4 sec pause, Dr End notified verbally, he has ordered digoxin level, and is comfortable with this length of a pause for a patient in a-fib, will continue to monitor the patient at this time as he is asymptomatic.

## 2017-12-04 NOTE — H&P (Signed)
[facility] [place] NAME: Joseph Barton    MR#:  [id number]  DATE OF BIRTH:  07-06-1926  DATE OF ADMISSION:  12/04/2017  PRIMARY CARE PHYSICIAN: Jerrol Banana., MD   REQUESTING/REFERRING PHYSICIAN:   CHIEF COMPLAINT:   Chief Complaint  Patient presents with  . Chest Pain  . Shortness of Breath    HISTORY OF PRESENT ILLNESS: Joseph Barton  is a 84 y.o. male with a known history per below presenting to the emergency room with 3-day history of worsening dyspnea on exertion, chest discomfort radiating to the left side of the chest worse with lifting any weight, associated with nausea, dizziness, noted worsening shortness of breath with recumbency, patient is having to sit more propped up at night to sleep, worsening leg swelling over the last several days, in the emergency room patient was found to have BNP greater than 600, INR 2.3, troponin normal, chest x-ray noted for scarring/fibrosis, EKG noted for A. fib/right bundle branch block/left anterior fascicular block/septal infarct, potassium 3.4, patient evaluated in the emergency room, daughter and wife present, patient is now been admitted with acute on chronic systolic congestive heart failure exacerbation with associated chest pain.  PAST MEDICAL HISTORY:   Past Medical History:  Diagnosis Date  . Abdominal aortic aneurysm ([place]) 01-2004   4.7 x 4.7 cm.  Marland Kitchen CAD (coronary artery disease) 01/25/2004   a. 01/2004 Ant MI/DES to LAD;  b. 10/2011 Neg MV.  . Chronic systolic heart failure ([place])    a. 08/2016 Echo: EF 35%, nl RV fxn, mod to sev MR, mild to mod TR, mod biatrial enlargement.  . Hyperlipidemia   . Ischemic cardiomyopathy    a. 08/2016 Echo: EF 35%.  . Moderate to Severe Mitral regurgitation    a. 08/2016 Echo: Mod-sev MR.  Marland Kitchen Permanent atrial fibrillation ([place])    a. Chronic coumadin (CHA2DS2VASc = 5).  . Pulmonary nodules    Noted on abdominal CT  . Statin intolerance     PAST  SURGICAL HISTORY:  Past Surgical History:  Procedure Laterality Date  . ABDOMINAL AORTIC ANEURYSM REPAIR     12/02/2007 [facility]  . ABDOMINAL AORTIC ANEURYSM REPAIR  2006   [facility]   . CARDIAC CATHETERIZATION  2009   [facility]  . CORONARY ANGIOPLASTY WITH STENT PLACEMENT  2005   Cypher stent LAD   . Cypher stents to LAD     01/25/2004  . SKIN SURGERY  2016   [facility] skin cancer    SOCIAL HISTORY:  Social History   Tobacco Use  . Smoking status: Former Smoker    Last attempt to quit: 12/01/1987    Years since quitting: 30.0  . Smokeless tobacco: Never Used  Substance Use Topics  . Alcohol use: No    FAMILY HISTORY:  Family History  Problem Relation Age of Onset  . Heart attack Father 66       Died w/ Heart attack  . Diabetes Father   . Heart attack Brother   . Heart disease Brother   . Diabetes Sister   . Heart disease Sister     DRUG ALLERGIES:  Allergies  Allergen Reactions  . Atorvastatin   . Simvastatin     REVIEW OF SYSTEMS:   CONSTITUTIONAL: No fever, +fatigue, weakness.  EYES: No blurred or double vision.  EARS, NOSE, AND THROAT: No tinnitus or ear pain.  RESPIRATORY: No cough,+ shortness of breath,no wheezing or hemoptysis.  CARDIOVASCULAR: +  chest pain, orthopnea, edema.  GASTROINTESTINAL: No nausea, vomiting, diarrhea or abdominal pain.  GENITOURINARY: No dysuria, hematuria.  ENDOCRINE: No polyuria, nocturia,  HEMATOLOGY: No anemia, easy bruising or bleeding SKIN: No rash or lesion. MUSCULOSKELETAL: No joint pain or arthritis.   NEUROLOGIC: No tingling, numbness, weakness.  PSYCHIATRY: No anxiety or depression.   MEDICATIONS AT HOME:  Prior to Admission medications   Medication Sig Start Date End Date Taking? Authorizing Provider  albuterol (PROVENTIL HFA;VENTOLIN HFA) 108 (90 Base) MCG/ACT inhaler Inhale 2 puffs into the lungs every 6 (six) hours as needed for wheezing or shortness of breath. 11/29/17  Yes Jerrol Banana., MD  digoxin  (LANOXIN) 0.125 MG tablet Take 1 tablet (0.125 mg total) by mouth every other day. 04/30/17 09/05/18 Yes Deboraha Sprang, MD  furosemide (LASIX) 20 MG tablet TAKE 2 TABLETS BY MOUTH  ONCE DAILY 10/03/17  Yes Deboraha Sprang, MD  metoprolol succinate (TOPROL-XL) 25 MG 24 hr tablet TAKE 1 TABLET BY MOUTH  DAILY 11/05/17  Yes Deboraha Sprang, MD  omeprazole (PRILOSEC) 20 MG capsule TAKE 1 CAPSULE BY MOUTH  DAILY 04/24/17  Yes Jerrol Banana., MD  warfarin (COUMADIN) 2.5 MG tablet Take 1 tablet (2.5 mg total) by mouth as directed. Patient taking differently: Take  tablet (1.25MG ) by mouth Monday, Wednesday, Friday and take 1 tablet (2.5MG ) by mouth Tuesday, Thursday, Saturday and Sunday 10/03/17  Yes Jerrol Banana., MD  Bioflavonoid Products (BIOFLEX PO) Take 1 tablet by mouth daily.     [provider]      PHYSICAL EXAMINATION:   VITAL SIGNS: Blood pressure 114/64, pulse 79, temperature 97.9 F (36.6 C), temperature source Oral, resp. rate 16, height 6' 1.5" (1.867 m), weight 87.1 kg (192 lb), SpO2 98 %.  GENERAL:  84 y.o.-year-old patient lying in the bed with no acute distress.  Frail-appearing EYES: Pupils equal, round, reactive to light and accommodation. No scleral icterus. Extraocular muscles intact.  HEENT: Head atraumatic, normocephalic. Oropharynx and nasopharynx clear.  NECK:  Supple, no jugular venous distention. No thyroid enlargement, no tenderness.  LUNGS: Rales on auscultation of the lungs at bases bilaterally. No use of accessory muscles of respiration.  CARDIOVASCULAR: S1, S2 normal. No murmurs, rubs, or gallops.  ABDOMEN: Soft, nontender, nondistended. Bowel sounds present. No organomegaly or mass.  EXTREMITIES: Bilateral lower extremity pitting edema,no cyanosis, or clubbing.  NEUROLOGIC: Cranial nerves II through XII are intact. MAES. Gait not checked.  PSYCHIATRIC: The patient is alert and oriented x 3.  SKIN: No obvious rash, lesion, or ulcer.    LABORATORY PANEL:   CBC Recent Labs  Lab 12/04/17 0842  WBC 5.1  HGB 13.8  HCT 40.6  PLT 137*  MCV 94.8  MCH 32.3  MCHC 34.1  RDW 15.3*   ------------------------------------------------------------------------------------------------------------------  Chemistries  Recent Labs  Lab 12/04/17 0842  NA 140  K 3.4*  CL 105  CO2 24  GLUCOSE 164*  BUN 24*  CREATININE 1.37*  CALCIUM 8.8*   ------------------------------------------------------------------------------------------------------------------ estimated creatinine clearance is 41.9 mL/min (A) (by C-G formula based on SCr of 1.37 mg/dL (H)). ------------------------------------------------------------------------------------------------------------------ No results for input(s): TSH, T4TOTAL, T3FREE, THYROIDAB in the last 72 hours.  Invalid input(s): FREET3   Coagulation profile Recent Labs  Lab 12/04/17 0842  INR 2.30   ------------------------------------------------------------------------------------------------------------------- No results for input(s): DDIMER in the last 72 hours. -------------------------------------------------------------------------------------------------------------------  Cardiac Enzymes Recent Labs  Lab 12/04/17 0842  TROPONINI <0.03   ------------------------------------------------------------------------------------------------------------------ Invalid input(s): POCBNP  ---------------------------------------------------------------------------------------------------------------  Urinalysis    Component Value Date/Time   COLORURINE Yellow 09/03/2011 0737   APPEARANCEUR Clear 09/03/2011 0737   LABSPEC 1.020 09/03/2011 0737   PHURINE 6.0 09/03/2011 0737   GLUCOSEU Negative 09/03/2011 0737   HGBUR Negative 09/03/2011 0737   BILIRUBINUR Negative 09/03/2011 0737   KETONESUR Negative 09/03/2011 0737   PROTEINUR Negative 09/03/2011 0737   NITRITE Negative  09/03/2011 0737   LEUKOCYTESUR Negative 09/03/2011 0737     RADIOLOGY: Dg Chest 2 View  Result Date: 12/04/2017 CLINICAL DATA:  Chest pain EXAM: CHEST - 2 VIEW COMPARISON:  Nov 05, 2017 FINDINGS: There is scarring throughout the lungs with fibrotic change in lung bases. The nodular opacity seen in the right base region on prior study are not appreciable currently. There is no appreciable edema or consolidation. Heart is upper normal in size with pulmonary vascularity normal. No adenopathy. There is aortic atherosclerosis. There is a stent in the left anterior descending coronary artery. IMPRESSION: Areas of scarring and fibrosis. No well-defined nodular lesions are appreciable. No consolidation. Heart is upper normal in size. No adenopathy. There is aortic atherosclerosis. There is a stent in the left anterior descending coronary artery. Aortic Atherosclerosis (ICD10-I70.0). Electronically Signed   By: Lowella Grip III M.D.   On: April 26, 202019 09:26    EKG: Orders placed or performed during the hospital encounter of 12/04/17  . EKG 12-Lead  . EKG 12-Lead  . ED EKG within 10 minutes  . ED EKG within 10 minutes  . EKG 12-Lead  . EKG 12-Lead    IMPRESSION AND PLAN: *Acute on chronic systolic CHF w/ ICM Most recent echo noted for severe MR, EF 35% Admit to regular nursing for bed on our congestive heart failure protocol, IV Lasix twice daily, beta-blocker therapy, aspirin, on Coumadin, lisinopril, strict I&O monitoring, daily weights, repeat echocardiogram, cardiology to see, continue close medical monitoring  *Acute exertional chest pain w/ CAD Most likely secondary to above Will cycle cardiac enzymes, cardiology consult as stated above, follow-up on echocardiogram, beta-blocker therapy, lisinopril, aspirin, on Coumadin, nitrates as needed, IV morphine for breakthrough pain  *Chronic atrial fibrillation Stable Continue beta-blocker therapy and Coumadin, check PT/INR daily, currently  therapeutic  *Severe mitral regurgitation Plan of care as stated above  *Chronic hyperlipidemia, unspecified Stable  Statin insensitivity noted   *Acute hypokalemia Replete with p.o. potassium, check magnesium level, BMP in the morning    All the records are reviewed and case discussed with ED provider. Management plans discussed with the patient, family and they are in agreement.  CODE STATUS:full    TOTAL TIME TAKING CARE OF THIS PATIENT: 45 minutes.    Avel Peace Franke Menter M.D on 12/04/2017   Between 7am to 6pm - Pager - [phone]  After 6pm go to [email or web address] - password [email or web address] [facility]  Office  [phone]  CC: Primary care physician; Jerrol Banana., MD   Note: This dictation was prepared with Dragon dictation along with smaller phrase technology. Any transcriptional errors that result from this process are unintentional.

## 2017-12-04 NOTE — ED Triage Notes (Signed)
Patient presents to the ED with "squeezing" sensation to his chest and shortness of breath for the past 3 nights.  Patient states he is currently having this same pain.  Patient states pain is worse when he lies down to try to sleep.  Patient reports some nausea and dizziness.

## 2017-12-04 NOTE — ED Notes (Signed)
Pt being transported to 2A by RN Velna Hatchet

## 2017-12-04 NOTE — ED Notes (Signed)
Pt returned from Xray at this time  

## 2017-12-04 NOTE — ED Notes (Signed)
Pt given meal tray at this time 

## 2017-12-04 NOTE — Progress Notes (Signed)
CCMD notified this RN that pt had episode of bradycardia in the 30's, pt asymptomatic, Dr End in room for consultation

## 2017-12-04 NOTE — Telephone Encounter (Signed)
Patient's wife called stating that the patient has been unable to sleep all night due to a squeezing sensation in his chest.  She states that he is now having nausea.  She was instructed to have him taken directly to the ER for evaluation.  She verbalized understanding and states she will take him there now.

## 2017-12-04 NOTE — Progress Notes (Signed)
   12/04/17 1650  Clinical Encounter Type  Visited With Patient and family together  Visit Type Initial (advanced directive order)  Referral From Nurse  Consult/Referral To Chaplain   Chaplain responded to order for advanced directive.  Patient requested chaplain leave document to review; chaplain encouraged patient to have chaplain paged when ready to discuss and/or complete advanced directive.

## 2017-12-04 NOTE — Progress Notes (Signed)
[facility] for warfarin Indication: atrial fibrillation  Allergies  Allergen Reactions  . Atorvastatin   . Simvastatin     Patient Measurements: Height: 6\' 1"  (185.4 cm) Weight: 197 lb 6.4 oz (89.5 kg) IBW/kg (Calculated) : 79.9  Vital Signs: Temp: 97.5 F (36.4 C) (06/04 1519) Temp Source: Oral (06/04 1519) BP: 118/84 (06/04 1519) Pulse Rate: 67 (06/04 1519)  Labs: Recent Labs    12/04/17 0842  HGB 13.8  HCT 40.6  PLT 137*  LABPROT 25.1*  INR 2.30  CREATININE 1.37*  TROPONINI <0.03    Estimated Creatinine Clearance: 41.3 mL/min (A) (by C-G formula based on SCr of 1.37 mg/dL (H)).   Medical History: Past Medical History:  Diagnosis Date  . Abdominal aortic aneurysm ([place]) 01-2004   4.7 x 4.7 cm.  Marland Kitchen CAD (coronary artery disease) 01/25/2004   a. 01/2004 Ant MI/DES to LAD;  b. 10/2011 Neg MV.  . Chronic systolic heart failure ([place])    a. 08/2016 Echo: EF 35%, nl RV fxn, mod to sev MR, mild to mod TR, mod biatrial enlargement.  . Hyperlipidemia   . Ischemic cardiomyopathy    a. 08/2016 Echo: EF 35%.  . Moderate to Severe Mitral regurgitation    a. 08/2016 Echo: Mod-sev MR.  Marland Kitchen Permanent atrial fibrillation ([place])    a. Chronic coumadin (CHA2DS2VASc = 5).  . Pulmonary nodules    Noted on abdominal CT  . Statin intolerance     Assessment: Patient admitted with a therapeutic INR (2.30) on 2.5mg  daily except 1.25mg  MWF.    Goal of Therapy:  INR 2-3 Monitor platelets by anticoagulation protocol: Yes   Plan:  Given that INR is therapeutic on this regimen I will continue with 2.5mg  today, which is the same dose he would receive at home. I will order a daily INR and pharmacy will continue to monitor and adjust doses as needed.  Dallie Piles, PharmD 12/04/2017,3:44 PM

## 2017-12-04 NOTE — ED Notes (Signed)
Caryl Pina RN informed of needing them to come and get patient for bed assignment.

## 2017-12-04 NOTE — Plan of Care (Signed)
  Problem: Education: Goal: Knowledge of General Education information will improve Outcome: Progressing   Problem: Clinical Measurements: Goal: Ability to maintain clinical measurements within normal limits will improve Outcome: Progressing   Problem: Pain Managment: Goal: General experience of comfort will improve Outcome: Progressing   Problem: Safety: Goal: Ability to remain free from injury will improve Outcome: Progressing   

## 2017-12-04 NOTE — ED Notes (Signed)
Patient to room 26 at Dr. Marcello Fennel' request.  Jinny Blossom RN aware of placement in room.

## 2017-12-04 NOTE — ED Provider Notes (Signed)
[facility] Emergency Department Provider Note    First MD Initiated Contact with Patient 12/04/17 [phone]     (approximate)  I have reviewed the triage vital signs and the nursing notes.   HISTORY  Chief Complaint Chest Pain and Shortness of Breath    HPI Joseph Barton is a 84 y.o. male a history of CAD status post stent as well as systolic heart failure presents to the ER with 3 days of worsening generalized epigastric discomfort and chest tightness that is worse when he is lying flat as well as lower extremity swelling and some exertional dyspnea.  States he does have chest pain right now.  Does not feel as severe as when he had his heart attack but has felt unwell.  Denies any changes to his medications.  Is on Coumadin but does not take aspirin.  Denies any fevers.  No cough.  No numbness or tingling.   Echo 08/04/16:  INTERPRETATION MODERATE SEGMENTAL LV SYSTOLIC DYSFUNCTION WITH AN ESTIMATED EF = 35 % NORMAL RIGHT VENTRICULAR SYSTOLIC FUNCTION MODERATE-TO-SEVERE MITRAL VALVE REGURGITATION MILD-TO-MODERATE TRICUSPID VALVE REGURGITATION NO VALVULAR STENOSIS MODERATE LV ENLARGEMENT MODERATE BIATRIAL ENLARGEMENT MILD RV ENLARGEMENT   Past Medical History:  Diagnosis Date  . Abdominal aortic aneurysm ([place]) 01-2004   4.7 x 4.7 cm.  Marland Kitchen CAD (coronary artery disease) 01/25/2004   a. 01/2004 Ant MI/DES to LAD;  b. 10/2011 Neg MV.  . Chronic systolic heart failure ([place])    a. 08/2016 Echo: EF 35%, nl RV fxn, mod to sev MR, mild to mod TR, mod biatrial enlargement.  . Hyperlipidemia   . Ischemic cardiomyopathy    a. 08/2016 Echo: EF 35%.  . Moderate to Severe Mitral regurgitation    a. 08/2016 Echo: Mod-sev MR.  Marland Kitchen Permanent atrial fibrillation ([place])    a. Chronic coumadin (CHA2DS2VASc = 5).  . Pulmonary nodules    Noted on abdominal CT  . Statin intolerance    Family History  Problem Relation Age of Onset  . Heart attack Father 73       Died w/ Heart  attack  . Diabetes Father   . Heart attack Brother   . Heart disease Brother   . Diabetes Sister   . Heart disease Sister    Past Surgical History:  Procedure Laterality Date  . ABDOMINAL AORTIC ANEURYSM REPAIR     12/02/2007 [facility]  . ABDOMINAL AORTIC ANEURYSM REPAIR  2006   [facility]   . CARDIAC CATHETERIZATION  2009   [facility]  . CORONARY ANGIOPLASTY WITH STENT PLACEMENT  2005   Cypher stent LAD   . Cypher stents to LAD     01/25/2004  . SKIN SURGERY  2016   [facility] skin cancer   Patient Active Problem List   Diagnosis Date Noted  . Subclinical hypothyroidism 10/06/2015  . Allergic rhinitis 09/10/2015  . Bradycardia 09/10/2015  . Failure of erection 09/10/2015  . Blood glucose elevated 09/10/2015  . BP (high blood pressure) 09/10/2015  . Neuropathy 09/10/2015  . Apnea, sleep 09/10/2015  . Cancer of skin, squamous cell 09/10/2015  . Acid reflux 04/28/2015  . Arthritis, degenerative 12/25/2014  . Cardiomyopathy, ischemic 09/27/2013  . HLD (hyperlipidemia) 09/27/2013  . Lung nodule, multiple 09/27/2013  . Drug intolerance 09/27/2013  . Ischemic cardiomyopathy 12/01/2011  . Atrial fibrillation ([place]) 12/01/2011  . Chronic systolic heart failure ([place]) 12/01/2011  . IVCD (intraventricular conduction defect) 12/01/2011  . Intraventricular block 12/01/2011  . Myocardial ischemia 12/01/2011  .  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 CAD S/P percutaneous coronary angioplasty 01/25/2004  . Heart attack ([place]) 01/25/2004  . Myocardial infarction ([place]) 01/25/2004  . AAA (abdominal aortic aneurysm) ([place]) 01/01/2004      Prior to Admission medications   Medication Sig Start Date End Date Taking? Authorizing Provider  albuterol (PROVENTIL HFA;VENTOLIN HFA) 108 (90 Base) MCG/ACT inhaler Inhale 2 puffs into the lungs every 6 (six) hours as needed for wheezing or shortness of breath. 11/29/17   Jerrol Banana., MD  Bioflavonoid Products (BIOFLEX PO) Take 1 tablet by mouth daily.     [provider]  digoxin (LANOXIN) 0.125 MG tablet Take 1 tablet (0.125 mg total) by mouth every other day. 04/30/17 09/05/18  Deboraha Sprang, MD  furosemide (LASIX) 20 MG tablet TAKE 2 TABLETS BY MOUTH  ONCE DAILY 10/03/17   Deboraha Sprang, MD  metoprolol succinate (TOPROL-XL) 25 MG 24 hr tablet TAKE 1 TABLET BY MOUTH  DAILY 11/05/17   Deboraha Sprang, MD  omeprazole (PRILOSEC) 20 MG capsule TAKE 1 CAPSULE BY MOUTH  DAILY 04/24/17   Jerrol Banana., MD  warfarin (COUMADIN) 2.5 MG tablet Take 1 tablet (2.5 mg total) by mouth as directed. 10/03/17   Jerrol Banana., MD    Allergies Atorvastatin and Simvastatin    Social History Social History   Tobacco Use  . Smoking status: Former Smoker    Last attempt to quit: 12/01/1987    Years since quitting: 30.0  . Smokeless tobacco: Never Used  Substance Use Topics  . Alcohol use: No  . Drug use: No    Review of Systems Patient denies headaches, rhinorrhea, blurry vision, numbness, shortness of breath, chest pain, edema, cough, abdominal pain, nausea, vomiting, diarrhea, dysuria, fevers, rashes or hallucinations unless otherwise stated above in HPI. ____________________________________________   PHYSICAL EXAM:  VITAL SIGNS: Vitals:   12/04/17 0833  BP: 125/66  Pulse: 86  Resp: 18  Temp: 97.9 F (36.6 C)  SpO2: 97%    Constitutional: Alert and oriented.  Eyes: Conjunctivae are normal.  Head: Atraumatic. Nose: No congestion/rhinnorhea. Mouth/Throat: Mucous membranes are moist.   Neck: No stridor. Painless ROM.  Cardiovascular: Normal rate, regular rhythm. Grossly normal heart sounds.  Good peripheral circulation. Respiratory: Normal respiratory effort.  No retractions. Lungs CTAB. Gastrointestinal: Soft and nontender. No distention. No abdominal bruits. No CVA tenderness. Genitourinary: deferred Musculoskeletal: No lower  extremity tenderness, 2+ BLE.  No joint effusions. Neurologic:  Normal speech and language. No gross focal neurologic deficits are appreciated. No facial droop Skin:  Skin is warm, dry and intact. No rash noted. Psychiatric: Mood and affect are normal. Speech and behavior are normal.  ____________________________________________   LABS (all labs ordered are listed, but only abnormal results are displayed)  Results for orders placed or performed during the hospital encounter of 12/04/17 (from the past 24 hour(s))  CBC     Status: Abnormal   Collection Time: 12/04/17  8:42 AM  Result Value Ref Range   WBC 5.1 3.8 - 10.6 K/uL   RBC 4.28 (L) 4.40 - 5.90 MIL/uL   Hemoglobin 13.8 13.0 - 18.0 g/dL   HCT 40.6 40.0 - 52.0 %   MCV 94.8 80.0 - 100.0 fL   MCH 32.3 26.0 - 34.0 pg   MCHC 34.1 32.0 - 36.0 g/dL   RDW 15.3 (H) 11.5 - 14.5 %   Platelets 137 (L)  150 - 440 K/uL   ____________________________________________  EKG My review and personal interpretation at Time: 8:32   Indication: chest pain  Rate: 80  Rhythm: afib Axis: normal Other: normal intervals,  New t wave inversions as compared to previous tracing, no stemi ____________________________________________  RADIOLOGY  I personally reviewed all radiographic images ordered to evaluate for the above acute complaints and reviewed radiology reports and findings.  These findings were personally discussed with the patient.  Please see medical record for radiology report.  ____________________________________________   PROCEDURES  Procedure(s) performed:  Procedures    Critical Care performed: no ____________________________________________   INITIAL IMPRESSION / ASSESSMENT AND PLAN / ED COURSE  Pertinent labs & imaging results that were available during my care of the patient were reviewed by me and considered in my medical decision making (see chart for details).   DDX: ACS, pericarditis, esophagitis, boerhaaves, pe,  dissection, pna, bronchitis, costochondritis   Joseph Barton is a 84 y.o. who presents to the ED with symptoms as described above.  Patient with some typical and atypical features of chest pain.  EKG does show evidence of new T wave inversions as compared to previous.  His initial troponin is negative.  Patient does have a history of a AAA status post repair at [facility].  Has been compliant with his medications and his Coumadin is therapeutic at this time.  May be a component of decompensated worsening congestive heart failure to explain the patient's EKG abnormality but given his chest pain, age and risk factors do feel patient will require hospitalization for further medical management and formal rule out.  Have discussed with the patient and available family all diagnostics and treatments performed thus far and all questions were answered to the best of my ability. The patient demonstrates understanding and agreement with plan.       As part of my medical decision making, I reviewed the following data within the [facility] notes reviewed and incorporated, Labs reviewed, notes from prior ED visits.   ____________________________________________   FINAL CLINICAL IMPRESSION(S) / ED DIAGNOSES  Final diagnoses:  Chest pain, unspecified type      NEW MEDICATIONS STARTED DURING THIS VISIT:  New Prescriptions   No medications on file     Note:  This document was prepared using Dragon voice recognition software and may include unintentional dictation errors.    Merlyn Lot, MD 12/04/17 [phone]

## 2017-12-05 ENCOUNTER — Inpatient Hospital Stay (HOSPITAL_COMMUNITY)
Admit: 2017-12-05 | Discharge: 2017-12-05 | Disposition: A | Discharge status: Home / Self Care | Payer: Medicare Other | Attending: Family Medicine | Admitting: Family Medicine

## 2017-12-05 DIAGNOSIS — I340 Nonrheumatic mitral (valve) insufficiency: Secondary | ICD-10-CM

## 2017-12-05 LAB — BASIC METABOLIC PANEL
ANION GAP: 8 (ref 5–15)
BUN: 23 mg/dL — ABNORMAL HIGH (ref 6–20)
CO2: 26 mmol/L (ref 22–32)
Calcium: 8.4 mg/dL — ABNORMAL LOW (ref 8.9–10.3)
Chloride: 108 mmol/L (ref 101–111)
Creatinine, Ser: 1.12 mg/dL (ref 0.61–1.24)
GFR calc Af Amer: >60 mL/min (ref >60)
GFR calc non Af Amer: 56 mL/min — ABNORMAL LOW (ref >60)
Glucose, Bld: 110 mg/dL — ABNORMAL HIGH (ref 65–99)
Potassium: 3.6 mmol/L (ref 3.5–5.1)
SODIUM: 142 mmol/L (ref 135–145)

## 2017-12-05 LAB — LIPID PANEL
CHOL/HDL RATIO: 6.1 ratio
CHOLESTEROL: 158 mg/dL (ref 0–200)
HDL: 26 mg/dL — AB (ref >40)
LDL Cholesterol: 116 mg/dL — ABNORMAL HIGH (ref 0–99)
TRIGLYCERIDES: 80 mg/dL (ref <150)
VLDL: 16 mg/dL (ref 0–40)

## 2017-12-05 LAB — DIGOXIN LEVEL: DIGOXIN LVL: 0.6 ng/mL — AB (ref 0.8–2.0)

## 2017-12-05 LAB — TROPONIN I: Troponin I: <0.03 ng/mL (ref <0.03)

## 2017-12-05 LAB — PROTIME-INR
INR: 2.77
Prothrombin Time: 29 seconds — ABNORMAL HIGH (ref 11.4–15.2)

## 2017-12-05 MED ORDER — WARFARIN SODIUM 2.5 MG PO TABS
1.2500 mg | ORAL_TABLET | ORAL | Status: DC
  Administered 2017-12-05: 1.25 mg via ORAL
  Filled 2017-12-05: qty 0.5
  Filled 2017-12-05: qty 1

## 2017-12-05 MED ORDER — WARFARIN SODIUM 2.5 MG PO TABS
2.5000 mg | ORAL_TABLET | ORAL | Status: DC
  Filled 2017-12-05 (×2): qty 1

## 2017-12-05 MED ORDER — WARFARIN - PHARMACIST DOSING INPATIENT
Freq: Every day | Status: DC
Start: 2017-12-05 — End: 2017-12-06

## 2017-12-05 NOTE — Progress Notes (Signed)
*  PRELIMINARY RESULTS* Echocardiogram 2D Echocardiogram has been performed.  Sherrie Sport 12/05/2017, 9:17 AM

## 2017-12-05 NOTE — Progress Notes (Signed)
Patient Heart rate maintaining in 50s with irregular rhythm. Patient having pauses approximately 2.2 seconds. MD notified. Acknowledged. Patient resting. Will monitor

## 2017-12-05 NOTE — Progress Notes (Signed)
Cardiovascular and Pulmonary Nurse Navigator Note:    84 year old male with PMHx of chronic atrial fibrillation, chronic systolic CHF, history of aortic aneurysm, HLD, ischemic CM with EF of 35% who presents to the hospital due to worsening SOB and noted to be in  Acute on chronic congestive heart failure.   Active Problem List this admission:   1. CHF - patient remains on IV diuresis.   2. Chest pain - resolved.  Troponins negative x 3.   3. Essential HTN - patient on metoprolol and lisinopril.  4. Anxiety - on Xanax 5. GERD - on protonix 6. Hx of chronic atrial fib - rate controlled.  Patient on Toprol and Coumadin.    CHF Education:  Patient lying in bed watching TV and visiting with family.   Educational session with patient, wife and daughter completed.   Provided patient/family with "Living Better with Heart Failure" packet. Briefly reviewed definition of heart failure and signs and symptoms of an exacerbation. Discussed the meaning of EF with patient/family.    *Reviewed importance of and reason behind checking weight daily in the AM, after using the bathroom, but before getting dressed. Patient has scales.  Patient has not been weighing himself on a regular basis.  ??  Reviewed the following information with patient:  *Discussed when to call the Dr= weight gain of >2lb overnight of 5lb in a week,  *Discussed yellow zone= call MD: weight gain of >2lb overnight of 5lb in a week, increased swelling, increased SOB when lying down, chest discomfort, dizziness, increased fatigue. ?Patient/family able to repeat this back to me.?? *Red Zone= call 911: struggle to breath, fainting or near fainting, significant chest pain. ?  *Reviewed low sodium diet-provided handout of recommended and not recommended foods. Reviewed reading labels with patient. Discussed fluid intake with patient as well. Patient not currently on a fluid restriction, but advised no more than 8-8 ounces glass of fluids per  day.  Note:  Dietitian Consultation entered for diet education.    *Instructed patient to take medications as prescribed for heart failure. Explained briefly why pt is on the medications (either make you feel better, live longer or keep you out of the hospital) and discussed monitoring and side effects.   *Discussed exercise. - Patient and family describe patient as being very active.  Physical Therapy evaluation pending.  Encouraged patient to remain as active as possible.   ? *Smoking Cessation?- patient former smoker.    *[facility] - Explained the role of the [facility].  Stressed the point that the [facility] does not replace his PCP/Cardiologist, but is an additional resource in helping manage his heart failure.  Patient/family agreeable to being followed in the [facility].  Patient appointment scheduled for December 11, 2017 at 1:40 p.m.   ? Roanna Epley, RN, BSN, [facility]? [facility]  Cardiovascular &?Pulmonary Nurse Navigator  Direct Line: [phone]  Department Phone #: [phone] Fax: [phone]? Email Address: [email or web address]

## 2017-12-05 NOTE — Progress Notes (Signed)
[place] [facility] [place] NAME: Joseph Barton    MR#:  [id number]  DATE OF BIRTH:  12/24/1926  SUBJECTIVE:   Pt. Here due to chest pain and shortness of breath and noted to be in acute on chronic systolic CHF. Much improved with diuresis and denies any chest pain presently.    REVIEW OF SYSTEMS:    Review of Systems  Constitutional: Negative for chills and fever.  HENT: Negative for congestion and tinnitus.   Eyes: Negative for blurred vision and double vision.  Respiratory: Positive for shortness of breath. Negative for cough and wheezing.   Cardiovascular: Negative for chest pain, orthopnea and PND.  Gastrointestinal: Negative for abdominal pain, diarrhea, nausea and vomiting.  Genitourinary: Negative for dysuria and hematuria.  Neurological: Negative for dizziness, sensory change and focal weakness.  All other systems reviewed and are negative.   Nutrition: Heart Healthy Tolerating Diet: Yes Tolerating PT: Ambulatory   DRUG ALLERGIES:   Allergies  Allergen Reactions  . Atorvastatin   . Simvastatin     VITALS:  Blood pressure 114/78, pulse (!) 149, temperature 98.2 F (36.8 C), resp. rate 17, height 6\' 1"  (1.854 m), weight 87.9 kg (193 lb 12.8 oz), SpO2 100 %.  PHYSICAL EXAMINATION:   Physical Exam  GENERAL:  84 y.o.-year-old patient lying in bed in no acute distress.  EYES: Pupils equal, round, reactive to light and accommodation. No scleral icterus. Extraocular muscles intact.  HEENT: Head atraumatic, normocephalic. Oropharynx and nasopharynx clear.  NECK:  Supple, no jugular venous distention. No thyroid enlargement, no tenderness.  LUNGS: Normal breath sounds bilaterally, no wheezing, bibasilar rales, No rhonchi. No use of accessory muscles of respiration.  CARDIOVASCULAR: S1, S2 normal. No murmurs, rubs, or gallops.  ABDOMEN: Soft, nontender, nondistended. Bowel sounds present. No organomegaly or mass.  EXTREMITIES: No  cyanosis, clubbing or edema b/l.    NEUROLOGIC: Cranial nerves II through XII are intact. No focal Motor or sensory deficits b/l.   PSYCHIATRIC: The patient is alert and oriented x 3.  SKIN: No obvious rash, lesion, or ulcer.    LABORATORY PANEL:   CBC Recent Labs  Lab 12/04/17 0842  WBC 5.1  HGB 13.8  HCT 40.6  PLT 137*   ------------------------------------------------------------------------------------------------------------------  Chemistries  Recent Labs  Lab 12/04/17 1545 12/05/17 0331  NA  --  142  K  --  3.6  CL  --  108  CO2  --  26  GLUCOSE  --  110*  BUN  --  23*  CREATININE  --  1.12  CALCIUM  --  8.4*  MG 1.8  --    ------------------------------------------------------------------------------------------------------------------  Cardiac Enzymes Recent Labs  Lab 12/05/17 0331  TROPONINI <0.03   ------------------------------------------------------------------------------------------------------------------  RADIOLOGY:  Dg Chest 2 View  Result Date: 12/04/2017 CLINICAL DATA:  Chest pain EXAM: CHEST - 2 VIEW COMPARISON:  Nov 05, 2017 FINDINGS: There is scarring throughout the lungs with fibrotic change in lung bases. The nodular opacity seen in the right base region on prior study are not appreciable currently. There is no appreciable edema or consolidation. Heart is upper normal in size with pulmonary vascularity normal. No adenopathy. There is aortic atherosclerosis. There is a stent in the left anterior descending coronary artery. IMPRESSION: Areas of scarring and fibrosis. No well-defined nodular lesions are appreciable. No consolidation. Heart is upper normal in size. No adenopathy. There is aortic atherosclerosis. There is a stent in the left anterior descending coronary artery. Aortic  Atherosclerosis (ICD10-I70.0). Electronically Signed   By: Lowella Grip III M.D.   On: 07-23-202019 09:26     ASSESSMENT AND PLAN:   84 year old male with past  medical history of chronic atrial fibrillation, chronic systolic CHF, history of aortic aneurysm, hyperlipidemia, ischemic cardiomyopathy ejection fraction of 35% who presents to the hospital due to worsening shortness of breath and noted to be in acute on chronic congestive heart failure.  1.  CHF-this is acute on chronic systolic dysfunction. - Improving with IV diuresis and will continue. -Continue low-dose metoprolol, lisinopril.  Appreciate cardiology input and they agree with this management.  2.  Chest pain- secondary to underlying mild CHF.  It has resolved now. -Cardiac markers x3 have been negative.  No plans for intervention with cardiac catheterization or stress test at this time.  This was discussed with cardiology.  3.  Essential hypertension-continue Toprol, lisinopril.  4.  Anxiety-continue Xanax as needed.    5.  GERD-continue Protonix.  6.  History of chronic atrial fibrillation-rate controlled.  Continue Toprol.  Coumadin has been resumed.  Possible discharge home tomorrow on oral diuretics if continues to improve.  All the records are reviewed and case discussed with Care Management/Social Worker. Management plans discussed with the patient, family and they are in agreement.  CODE STATUS: Partial code  DVT Prophylaxis: Warfarin  TOTAL TIME TAKING CARE OF THIS PATIENT: 30 minutes.   POSSIBLE D/C IN 1-2 DAYS, DEPENDING ON CLINICAL CONDITION.   Henreitta Leber M.D on 12/05/2017 at 1:44 PM  Between 7am to 6pm - Pager - [phone]  After 6pm go to [email or web address] - password [email or web address] [facility]  Office  [phone]  CC: Primary care physician; Jerrol Banana., MD

## 2017-12-05 NOTE — Progress Notes (Signed)
Progress Note  Patient Name: Joseph Barton Date of Encounter: 12/05/2017  Primary Cardiologist: No primary care provider on file.   Subjective   Feels much better this morning  chest pain resolved, had good diuresis Reports leg swelling resolved Feels close to his baseline Denied excessive fluid intake, had recently escalated Lasix up to 40 twice daily as outpatient administered by Dr. Rosanna Randy   Inpatient Medications    Scheduled Meds: . aspirin EC  81 mg Oral Daily  . furosemide  20 mg Intravenous BID  . lisinopril  2.5 mg Oral Daily  . metoprolol succinate  25 mg Oral Daily  . nitroGLYCERIN  0.5 inch Topical Q6H  . pantoprazole  40 mg Oral Daily  . sodium chloride flush  3 mL Intravenous Q12H  . warfarin  1.25 mg Oral Once per day on Mon Wed Fri  . [START ON 12/06/2017] warfarin  2.5 mg Oral Once per day on Sun Tue Thu Sat  . Warfarin - Pharmacist Dosing Inpatient   Does not apply q1800   Continuous Infusions: . sodium chloride     PRN Meds: sodium chloride, acetaminophen, albuterol, ALPRAZolam, morphine injection, nitroGLYCERIN, ondansetron (ZOFRAN) IV, sodium chloride flush   Vital Signs    Vitals:   12/04/17 1958 12/04/17 1958 12/05/17 0352 12/05/17 0812  BP: 129/81  124/72 114/78  Pulse: 80  62 (!) 149  Resp: 18  18 17   Temp:  98.2 F (36.8 C) 98.4 F (36.9 C) 98.2 F (36.8 C)  TempSrc:      SpO2: 94%  98% 100%  Weight:   193 lb 12.8 oz (87.9 kg)   Height:        Intake/Output Summary (Last 24 hours) at 12/05/2017 1141 Last data filed at 12/05/2017 1025 Gross per 24 hour  Intake 240 ml  Output 850 ml  Net -610 ml   Filed Weights   12/04/17 0837 12/04/17 1520 12/05/17 0352  Weight: 192 lb (87.1 kg) 197 lb 6.4 oz (89.5 kg) 193 lb 12.8 oz (87.9 kg)    Telemetry    Atrial fib - Personally Reviewed  ECG      Physical Exam   GEN: No acute distress.   Neck: No JVD Cardiac:  Irregularly irregular,  no murmurs, rubs, or gallops.  Respiratory: Clear  to auscultation bilaterally. GI: Soft, nontender, non-distended  MS: No edema; No deformity. Neuro:  Nonfocal  Psych: Normal affect   Labs    Chemistry Recent Labs  Lab 12/04/17 0842 12/05/17 0331  NA 140 142  K 3.4* 3.6  CL 105 108  CO2 24 26  GLUCOSE 164* 110*  BUN 24* 23*  CREATININE 1.37* 1.12  CALCIUM 8.8* 8.4*  GFRNONAA 44* 56*  GFRAA 51* >60  ANIONGAP 11 8     Hematology Recent Labs  Lab 12/04/17 0842  WBC 5.1  RBC 4.28*  HGB 13.8  HCT 40.6  MCV 94.8  MCH 32.3  MCHC 34.1  RDW 15.3*  PLT 137*    Cardiac Enzymes Recent Labs  Lab 12/04/17 0842 12/04/17 1545 12/04/17 2131 12/05/17 0331  TROPONINI <0.03 <0.03 <0.03 <0.03   No results for input(s): TROPIPOC in the last 168 hours.   BNP Recent Labs  Lab 12/04/17 0842  BNP 633.0*     DDimer No results for input(s): DDIMER in the last 168 hours.   Radiology    Dg Chest 2 View  Result Date: 12/04/2017 CLINICAL DATA:  Chest pain EXAM: CHEST - 2 VIEW  COMPARISON:  Nov 05, 2017 FINDINGS: There is scarring throughout the lungs with fibrotic change in lung bases. The nodular opacity seen in the right base region on prior study are not appreciable currently. There is no appreciable edema or consolidation. Heart is upper normal in size with pulmonary vascularity normal. No adenopathy. There is aortic atherosclerosis. There is a stent in the left anterior descending coronary artery. IMPRESSION: Areas of scarring and fibrosis. No well-defined nodular lesions are appreciable. No consolidation. Heart is upper normal in size. No adenopathy. There is aortic atherosclerosis. There is a stent in the left anterior descending coronary artery. Aortic Atherosclerosis (ICD10-I70.0). Electronically Signed   By: Lowella Grip III M.D.   On: 29-Oct-202019 09:26    Cardiac Studies   Echo pending  Patient Profile     Joseph Barton is a 84 y.o. male with a hx of coronary artery disease status post PCI to the LAD (2005),  chronic systolic heart failure with LVEF of 97% complicated by moderate to severe mitral regurgitation, AAA, permanent atrial fibrillation, and hyperlipidemia who is being seen today for the evaluation of shortness of breath, edema, and chest pain    Assessment & Plan     Acute on chronic systolic heart failure secondary to ischemic cardiomyopathy  at least 5 pounds over the last 2 to 3 weeks with accompanying edema and orthopnea.  --continue with IV diuresis.  furosemide 20 mg IV twice daily,  --Continue metoprolol succinate and lisinopril. Prelim echo with EF 30%, global hypokinesis,  moderate MR, RVSP 51  Chest pain with history of coronary artery disease  Troponin is negative  Sx resolved with diuresis   No acute ischemic changes noted on EKG. Continue metoprolol and low-dose aspirin. Patient is not on statin given history of intolerance.  Permanent atrial fibrillation Heart rate is normal to low.   Continue metoprolol succinate Digoxin held  Chronic kidney disease stage III Creatinine at or slightly above baseline. Close renal function monitoring in the setting of ongoing diuresis.   Total encounter time more than 25 minutes  Greater than 50% was spent in counseling and coordination of care with the patient   For questions or updates, please contact [place] Please consult [email or web address] for contact info under Cardiology/STEMI.      Signed, Ida Rogue, MD  12/05/2017, 11:41 AM

## 2017-12-05 NOTE — Care Management Note (Signed)
Case Management Note  Patient Details  Name: Joseph Barton MRN: [id number] Date of Birth: September 05, 1926  Subjective/Objective:                  RNCM received consult for CHF/Home health. RNCM presented CHF project with [facility] and [facility] to include ReDS vest to monitor CHF in the home and treat. Patient states he is from home with his wife. He states she does most of the driving now for him.  His PCP is Dr. Rosanna Randy. He denies need for ambulatory assistance. He states that he is able to afford his medications. He has declined home health services.  Action/Plan: No current RNCM needs.   Expected Discharge Date:                  Expected Discharge Plan:     In-House Referral:     Discharge planning Services  CM Consult  Post Acute Care Choice:  Home Health Choice offered to:  Patient  DME Arranged:    DME Agency:     HH Arranged:  Patient Refused [place] Agency:     Status of Service:  Completed, signed off  If discussed at [employer] of Stay Meetings, dates discussed:    Additional Comments:  Marshell Garfinkel, RN 12/05/2017, 10:02 AM

## 2017-12-05 NOTE — Progress Notes (Addendum)
[place] for warfarin Indication: atrial fibrillation  Allergies  Allergen Reactions  . Atorvastatin   . Simvastatin     Patient Measurements: Height: 6\' 1"  (185.4 cm) Weight: 193 lb 12.8 oz (87.9 kg) IBW/kg (Calculated) : 79.9  Vital Signs: Temp: 98.4 F (36.9 C) (06/05 0352) BP: 124/72 (06/05 0352) Pulse Rate: 62 (06/05 0352)  Labs: Recent Labs    12/04/17 0842 12/04/17 1545 12/04/17 2131 12/05/17 0331  HGB 13.8  --   --   --   HCT 40.6  --   --   --   PLT 137*  --   --   --   LABPROT 25.1*  --   --  29.0*  INR 2.30  --   --  2.77  CREATININE 1.37*  --   --  1.12  TROPONINI <0.03 <0.03 <0.03 <0.03    Estimated Creatinine Clearance: 50.5 mL/min (by C-G formula based on SCr of 1.12 mg/dL).   Medical History: Past Medical History:  Diagnosis Date  . Abdominal aortic aneurysm ([place]) 01-2004   4.7 x 4.7 cm.  [place] CAD (coronary artery disease) 01/25/2004   a. 01/2004 Ant MI/DES to LAD;  b. 10/2011 Neg MV.  . Chronic systolic heart failure ([place])    a. 08/2016 Echo: EF 35%, nl RV fxn, mod to sev MR, mild to mod TR, mod biatrial enlargement.  . Hyperlipidemia   . Ischemic cardiomyopathy    a. 08/2016 Echo: EF 35%.  . Moderate to Severe Mitral regurgitation    a. 08/2016 Echo: Mod-sev MR.  [place] Kitchen Permanent atrial fibrillation ([place])    a. Chronic coumadin (CHA2DS2VASc = 5).  . Pulmonary nodules    Noted on abdominal CT  . Statin intolerance     Assessment: Patient admitted with a therapeutic INR (2.30) on 2.5mg  daily except 1.25mg  MWF.   Goal of Therapy:  INR 2-3 Monitor platelets by anticoagulation protocol: Yes   Plan:  Per Dr. Rockey Situ, resume warfarin. Pt had taken yesterdays dose PTA, therefore no doses were missed. Pt INR is therapeutic. Will continue pt home dose of 1.25 MWF and 2.5mg  all other days. INR in the AM if they do not d/c  Melissa D Maccia, Pharm.D, BCPS Clinical Pharmacist  12/05/2017,7:34 AM

## 2017-12-06 DIAGNOSIS — R079 Chest pain, unspecified: Secondary | ICD-10-CM

## 2017-12-06 DIAGNOSIS — I5022 Chronic systolic (congestive) heart failure: Secondary | ICD-10-CM

## 2017-12-06 LAB — PROTIME-INR
INR: 2.3
Prothrombin Time: 25.1 seconds — ABNORMAL HIGH (ref 11.4–15.2)

## 2017-12-06 LAB — BASIC METABOLIC PANEL
ANION GAP: 8 (ref 5–15)
BUN: 28 mg/dL — AB (ref 6–20)
CALCIUM: 9 mg/dL (ref 8.9–10.3)
CO2: 27 mmol/L (ref 22–32)
Chloride: 104 mmol/L (ref 101–111)
Creatinine, Ser: 1.17 mg/dL (ref 0.61–1.24)
GFR calc Af Amer: >60 mL/min (ref >60)
GFR calc non Af Amer: 53 mL/min — ABNORMAL LOW (ref >60)
GLUCOSE: 111 mg/dL — AB (ref 65–99)
Potassium: 3.3 mmol/L — ABNORMAL LOW (ref 3.5–5.1)
Sodium: 139 mmol/L (ref 135–145)

## 2017-12-06 LAB — ECHOCARDIOGRAM COMPLETE
Height: 73 in
Weight: 3100.8 oz

## 2017-12-06 LAB — MAGNESIUM: MAGNESIUM: 1.7 mg/dL (ref 1.7–2.4)

## 2017-12-06 MED ORDER — POTASSIUM CHLORIDE CRYS ER 20 MEQ PO TBCR: 40.0000 meq | EXTENDED_RELEASE_TABLET | Freq: Every day | ORAL | Status: DC

## 2017-12-06 MED ORDER — FUROSEMIDE 10 MG/ML IJ SOLN
40.0000 mg | Freq: Once | INTRAMUSCULAR | Status: AC
  Administered 2017-12-06: 40 mg via INTRAVENOUS
  Filled 2017-12-06: qty 4

## 2017-12-06 MED ORDER — FUROSEMIDE 20 MG PO TABS: 40.0000 mg | ORAL_TABLET | Freq: Two times a day (BID) | ORAL | 1 refills | Status: DC

## 2017-12-06 MED ORDER — POTASSIUM CHLORIDE CRYS ER 20 MEQ PO TBCR
40.0000 meq | EXTENDED_RELEASE_TABLET | ORAL | Status: DC
  Administered 2017-12-06: 40 meq via ORAL
  Filled 2017-12-06: qty 2

## 2017-12-06 MED ORDER — POTASSIUM CHLORIDE ER 20 MEQ PO TBCR: 20.0000 meq | EXTENDED_RELEASE_TABLET | Freq: Every day | ORAL | 1 refills | Status: DC

## 2017-12-06 MED ORDER — MAGNESIUM SULFATE 4 GM/100ML IV SOLN
4.0000 g | Freq: Once | INTRAVENOUS | Status: AC
  Administered 2017-12-06: 4 g via INTRAVENOUS
  Filled 2017-12-06: qty 100

## 2017-12-06 MED ORDER — LISINOPRIL 2.5 MG PO TABS: 2.5000 mg | ORAL_TABLET | Freq: Every day | ORAL | 1 refills | Status: DC

## 2017-12-06 NOTE — Discharge Summary (Signed)
[facility] NAME: Joseph Barton    MR#:  [id number]  DATE OF BIRTH:  1927/02/05  DATE OF ADMISSION:  12/04/2017 ADMITTING PHYSICIAN: Gorden Harms, MD  DATE OF DISCHARGE: 12/06/2017  PRIMARY CARE PHYSICIAN: Jerrol Banana., MD    ADMISSION DIAGNOSIS:  Chronic systolic heart failure (HCC) [I50.22] Ischemic cardiomyopathy [I25.5] Permanent atrial fibrillation ([place]) [I48.2] Chest pain, unspecified type [R07.9]  DISCHARGE DIAGNOSIS:  Active Problems:   CHF (congestive heart failure) ([place])   SECONDARY DIAGNOSIS:   Past Medical History:  Diagnosis Date  . Abdominal aortic aneurysm ([place]) 01-2004   4.7 x 4.7 cm.  Marland Kitchen CAD (coronary artery disease) 01/25/2004   a. 01/2004 Ant MI/DES to LAD;  b. 10/2011 Neg MV.  . Chronic systolic heart failure ([place])    a. 08/2016 Echo: EF 35%, nl RV fxn, mod to sev MR, mild to mod TR, mod biatrial enlargement.  . Hyperlipidemia   . Ischemic cardiomyopathy    a. 08/2016 Echo: EF 35%.  . Moderate to Severe Mitral regurgitation    a. 08/2016 Echo: Mod-sev MR.  Marland Kitchen Permanent atrial fibrillation ([place])    a. Chronic coumadin (CHA2DS2VASc = 5).  . Pulmonary nodules    Noted on abdominal CT  . Statin intolerance     HOSPITAL COURSE:   84 year old male with past medical history of chronic atrial fibrillation, chronic systolic CHF, history of aortic aneurysm, hyperlipidemia, ischemic cardiomyopathy ejection fraction of 35% who presents to the hospital due to worsening shortness of breath and noted to be in acute on chronic congestive heart failure.  1.  CHF-this is acute on chronic systolic dysfunction. -This was secondary to patient's increasing fluid and salt intake.  Patient was aggressively diuresed with IV Lasix and has responded.  He continues to have some volume overload but it has improved since admission. - Patient is presently being discharged on a higher dose of oral Lasix along with low-dose  metoprolol lisinopril. - He will follow-up with cardiology as an outpatient closely along with the heart failure clinic. - pt. Will follow up with Dr. Rockey Situ as an outpatient.   2.  Chest pain- secondary to underlying mild CHF. Improved and resolved with diuresis.  -Cardiac markers x3 were negative.  No plans for further Cardiac intervention at this time.   3.  Essential hypertension- pt. Will continue Toprol, lisinopril.  4.  GERD- pt. Will cont. His Omeprazole.   5.  History of chronic atrial fibrillation- pt. Remained rate controlled.  He will Continue Toprol, Coumadin.    DISCHARGE CONDITIONS:   Stable.   CONSULTS OBTAINED:  Treatment Team:  Nelva Bush, MD  DRUG ALLERGIES:   Allergies  Allergen Reactions  . Atorvastatin   . Simvastatin     DISCHARGE MEDICATIONS:   Allergies as of 12/06/2017      Reactions   Atorvastatin    Simvastatin       Medication List    STOP taking these medications   digoxin 0.125 MG tablet Commonly known as:  LANOXIN     TAKE these medications   albuterol 108 (90 Base) MCG/ACT inhaler Commonly known as:  PROVENTIL HFA;VENTOLIN HFA Inhale 2 puffs into the lungs every 6 (six) hours as needed for wheezing or shortness of breath.   BIOFLEX PO Take 1 tablet by mouth daily.   furosemide 20 MG tablet Commonly known as:  LASIX Take 2 tablets (40 mg total) by mouth 2 (two) times daily.  What changed:  when to take this   lisinopril 2.5 MG tablet Commonly known as:  PRINIVIL,ZESTRIL Take 1 tablet (2.5 mg total) by mouth daily. Start taking on:  12/07/2017   metoprolol succinate 25 MG 24 hr tablet Commonly known as:  TOPROL-XL TAKE 1 TABLET BY MOUTH  DAILY   omeprazole 20 MG capsule Commonly known as:  PRILOSEC TAKE 1 CAPSULE BY MOUTH  DAILY   Potassium Chloride ER 20 MEQ Tbcr Take 20 mEq by mouth daily.   warfarin 2.5 MG tablet Commonly known as:  COUMADIN Take as directed. If you are unsure how to take this  medication, talk to your nurse or doctor. Original instructions:  Take 1 tablet (2.5 mg total) by mouth as directed. What changed:    how much to take  how to take this  when to take this  additional instructions         DISCHARGE INSTRUCTIONS:   DIET:  Cardiac diet  DISCHARGE CONDITION:  Stable  ACTIVITY:  Activity as tolerated  OXYGEN:  Home Oxygen: No.   Oxygen Delivery: room air  DISCHARGE LOCATION:  home   If you experience worsening of your admission symptoms, develop shortness of breath, life threatening emergency, suicidal or homicidal thoughts you must seek medical attention immediately by calling 911 or calling your MD immediately  if symptoms less severe.  You Must read complete instructions/literature along with all the possible adverse reactions/side effects for all the Medicines you take and that have been prescribed to you. Take any new Medicines after you have completely understood and accpet all the possible adverse reactions/side effects.   Please note  You were cared for by a hospitalist during your hospital stay. If you have any questions about your discharge medications or the care you received while you were in the hospital after you are discharged, you can call the unit and asked to speak with the hospitalist on call if the hospitalist that took care of you is not available. Once you are discharged, your primary care physician will handle any further medical issues. Please note that NO REFILLS for any discharge medications will be authorized once you are discharged, as it is imperative that you return to your primary care physician (or establish a relationship with a primary care physician if you do not have one) for your aftercare needs so that they can reassess your need for medications and monitor your lab values.     Today   Shortness of breath much improved since admission.  No chest pain, dizziness, nausea, vomiting.  Patient wants to go  home.  Discussed with cardiology and will discharge patient on higher dose diuretics with close follow-up with them.  VITAL SIGNS:  Blood pressure 111/76, pulse 67, temperature 97.7 F (36.5 C), temperature source Oral, resp. rate 18, height 6\' 1"  (1.854 m), weight 90.1 kg (198 lb 9.6 oz), SpO2 93 %.  I/O:    Intake/Output Summary (Last 24 hours) at 12/06/2017 1441 Last data filed at 12/06/2017 1346 Gross per 24 hour  Intake 480 ml  Output 300 ml  Net 180 ml    PHYSICAL EXAMINATION:   GENERAL:  84 y.o.-year-old patient lying in bed in no acute distress.  EYES: Pupils equal, round, reactive to light and accommodation. No scleral icterus. Extraocular muscles intact.  HEENT: Head atraumatic, normocephalic. Oropharynx and nasopharynx clear.  NECK:  Supple, no jugular venous distention. No thyroid enlargement, no tenderness.  LUNGS: Normal breath sounds bilaterally, no wheezing, bibasilar rales,  No rhonchi. No use of accessory muscles of respiration.  CARDIOVASCULAR: S1, S2 normal. No murmurs, rubs, or gallops.  ABDOMEN: Soft, nontender, nondistended. Bowel sounds present. No organomegaly or mass.  EXTREMITIES: No cyanosis, clubbing or edema b/l.    NEUROLOGIC: Cranial nerves II through XII are intact. No focal Motor or sensory deficits b/l.   PSYCHIATRIC: The patient is alert and oriented x 3.  SKIN: No obvious rash, lesion, or ulcer.   DATA REVIEW:   CBC Recent Labs  Lab 12/04/17 0842  WBC 5.1  HGB 13.8  HCT 40.6  PLT 137*    Chemistries  Recent Labs  Lab 12/06/17 0451  NA 139  K 3.3*  CL 104  CO2 27  GLUCOSE 111*  BUN 28*  CREATININE 1.17  CALCIUM 9.0  MG 1.7    Cardiac Enzymes Recent Labs  Lab 12/05/17 0331  TROPONINI <0.03    Microbiology Results  No results found for this or any previous visit.  RADIOLOGY:  No results found.    Management plans discussed with the patient, family and they are in agreement.  CODE STATUS:     Code Status Orders   (From admission, onward)        Start     Ordered   12/04/17 1518  Limited resuscitation (code)  Continuous    Question Answer Comment  In the event of cardiac or respiratory ARREST: Initiate Code Blue, Call Rapid Response Yes   In the event of cardiac or respiratory ARREST: Perform CPR Yes   In the event of cardiac or respiratory ARREST: Perform Intubation/Mechanical Ventilation No   In the event of cardiac or respiratory ARREST: Use NIPPV/BiPAp only if indicated Yes   In the event of cardiac or respiratory ARREST: Administer ACLS medications if indicated Yes   In the event of cardiac or respiratory ARREST: Perform Defibrillation or Cardioversion if indicated Yes      12/04/17 1517    Code Status History    This patient has a current code status but no historical code status.      TOTAL TIME TAKING CARE OF THIS PATIENT: 40 minutes.    Henreitta Leber M.D on 12/06/2017 at 2:41 PM  Between 7am to 6pm - Pager - [phone]  After 6pm go to [email or web address] - password [email or web address] [facility]  Office  [phone]  CC: Primary care physician; Jerrol Banana., MD

## 2017-12-06 NOTE — Plan of Care (Signed)
Nutrition Education Note  RD consulted for nutrition education regarding new onset CHF.  RD provided "Low Sodium Nutrition Therapy" handout from the Academy of [employer]. Reviewed patient's dietary recall. Provided examples on ways to decrease sodium intake in diet. Discouraged intake of processed foods and use of salt shaker. Encouraged fresh fruits and vegetables as well as whole grain sources of carbohydrates to maximize fiber intake.   RD discussed why it is important for patient to adhere to diet recommendations, and emphasized the role of fluids, foods to avoid, and importance of weighing self daily. Teach back method used.  Expect good compliance.  Body mass index is 26.2 kg/m. Pt meets criteria for normal weight for age based on current BMI.  Current diet order is HH, patient is consuming approximately 100% of meals at this time. Labs and medications reviewed. No further nutrition interventions warranted at this time. RD contact information provided. If additional nutrition issues arise, please re-consult RD.   Koleen Distance MS, RD, LDN Pager #- [phone] Office#- [phone] After Hours Pager: [phone]

## 2017-12-06 NOTE — Progress Notes (Addendum)
Progress Note  Patient Name: Joseph Barton Date of Encounter: 12/06/2017  Primary Cardiologist: Caryl Comes  Subjective   SOB improved. No chest pain. Has not ambulated in the hallway. Documented UOP of 570 mL for the admission with a weight 193-->198 pounds over the past 24 hours. SCr 1.37-->1.12-->1.17, K+ 3.4-->3.6-->3.3, INR 2.30, LDL 116, digoxin 0.6, ruled out, BNP 633, echo pending.   Inpatient Medications    Scheduled Meds: . aspirin EC  81 mg Oral Daily  . furosemide  20 mg Intravenous BID  . lisinopril  2.5 mg Oral Daily  . metoprolol succinate  25 mg Oral Daily  . pantoprazole  40 mg Oral Daily  . sodium chloride flush  3 mL Intravenous Q12H  . warfarin  1.25 mg Oral Once per day on Mon Wed Fri  . warfarin  2.5 mg Oral Once per day on Sun Tue Thu Sat  . Warfarin - Pharmacist Dosing Inpatient   Does not apply q1800   Continuous Infusions: . sodium chloride     PRN Meds: sodium chloride, acetaminophen, albuterol, ALPRAZolam, morphine injection, nitroGLYCERIN, ondansetron (ZOFRAN) IV, sodium chloride flush   Vital Signs    Vitals:   12/05/17 0812 12/05/17 2004 12/06/17 0359 12/06/17 0500  BP: 114/78 111/62 (!) 101/58   Pulse: (!) 149 74 (!) 55   Resp: 17 (!) 24 20   Temp: 98.2 F (36.8 C) 98.2 F (36.8 C) 98.6 F (37 C)   TempSrc:  Oral    SpO2: 100% 93% 95%   Weight:    198 lb 9.6 oz (90.1 kg)  Height:        Intake/Output Summary (Last 24 hours) at 12/06/2017 0748 Last data filed at 12/05/2017 2306 Gross per 24 hour  Intake 480 ml  Output 200 ml  Net 280 ml   Filed Weights   12/04/17 1520 12/05/17 0352 12/06/17 0500  Weight: 197 lb 6.4 oz (89.5 kg) 193 lb 12.8 oz (87.9 kg) 198 lb 9.6 oz (90.1 kg)    Telemetry    Afib, 50s to 60s bpm - Personally Reviewed  ECG    n/a - Personally Reviewed  Physical Exam   GEN: No acute distress.   Neck: No JVD. Cardiac: Irregularly irregular, no murmurs, rubs, or gallops.  Respiratory: Clear to auscultation  bilaterally.  GI: Soft, nontender, non-distended.   MS: No edema; No deformity. Neuro:  Alert and oriented x 3; Nonfocal.  Psych: Normal affect.  Labs    Chemistry Recent Labs  Lab 12/04/17 0842 12/05/17 0331 12/06/17 0451  NA 140 142 139  K 3.4* 3.6 3.3*  CL 105 108 104  CO2 24 26 27   GLUCOSE 164* 110* 111*  BUN 24* 23* 28*  CREATININE 1.37* 1.12 1.17  CALCIUM 8.8* 8.4* 9.0  GFRNONAA 44* 56* 53*  GFRAA 51* >60 >60  ANIONGAP 11 8 8      Hematology Recent Labs  Lab 12/04/17 0842  WBC 5.1  RBC 4.28*  HGB 13.8  HCT 40.6  MCV 94.8  MCH 32.3  MCHC 34.1  RDW 15.3*  PLT 137*    Cardiac Enzymes Recent Labs  Lab 12/04/17 0842 12/04/17 1545 12/04/17 2131 12/05/17 0331  TROPONINI <0.03 <0.03 <0.03 <0.03   No results for input(s): TROPIPOC in the last 168 hours.   BNP Recent Labs  Lab 12/04/17 0842  BNP 633.0*     DDimer No results for input(s): DDIMER in the last 168 hours.   Radiology    Dg  Chest 2 View  Result Date: 12/04/2017 IMPRESSION: Areas of scarring and fibrosis. No well-defined nodular lesions are appreciable. No consolidation. Heart is upper normal in size. No adenopathy. There is aortic atherosclerosis. There is a stent in the left anterior descending coronary artery. Aortic Atherosclerosis (ICD10-I70.0). Electronically Signed   By: Lowella Grip III M.D.   On: 01-18-2018 09:26    Cardiac Studies   TTE pending  Patient Profile     84 y.o. male with history of coronary artery disease status post PCI to the LAD (7939), chronic systolic heart failure due to ICM with LVEF of 03% complicated by moderate to severe mitral regurgitation, AAA, permanent atrial fibrillation on Coumadin, and hyperlipidemiawho is being seen today for the evaluation of shortness of breath, edema, and chest pain.  Assessment & Plan    1. Acute on chronic systolic CHF due to ICM: -SOB improved -Await TTE -Uncertain if the weight this morning was accurate as it  showed a 5 pound weight gain overnight, recommend re-weigh -reports a dry weight of 190-192 pounds at home -Continue IV Lasix -Continue Toprol XL and lisinopril  -Consider transition from ACEi to ARB followed by Entresto after 36 hour washout  -Not on spironolactone given CKD -Daily weights -Strict Is and Os -CHF education  2. Chest pain with CAD as above: -Symptoms resolved with diuresis -Ruled out  -TTE pending -No acute EKG changes -ASA -Metoprolol -Outpatient follow up  3. Permanent Afib: -Ventricular rates mildly bradycardic leading to the holding of digoxin with improved ventricular rates into the 60s bpm -Continue Toprol XL 25 mg daily -Coumadin per pharmacy, INR 2.30  4. CKD stage III: -Stable -Monitor with diuresis   For questions or updates, please contact [place] Please consult [email or web address] for contact info under Cardiology/STEMI.    Signed, Christell Faith, PA-C [place] Pager: [phone] 12/06/2017, 7:48 AM

## 2017-12-06 NOTE — Progress Notes (Signed)
[place] for warfarin Indication: atrial fibrillation  Allergies  Allergen Reactions  . Atorvastatin   . Simvastatin     Patient Measurements: Height: 6\' 1"  (185.4 cm) Weight: 198 lb 9.6 oz (90.1 kg) IBW/kg (Calculated) : 79.9  Vital Signs: Temp: 98.6 F (37 C) (06/06 0359) Temp Source: Oral (06/05 2004) BP: 101/58 (06/06 0359) Pulse Rate: 55 (06/06 0359)  Labs: Recent Labs    12/04/17 0842 12/04/17 1545 12/04/17 2131 12/05/17 0331 12/06/17 0451  HGB 13.8  --   --   --   --   HCT 40.6  --   --   --   --   PLT 137*  --   --   --   --   LABPROT 25.1*  --   --  29.0* 25.1*  INR 2.30  --   --  2.77 2.30  CREATININE 1.37*  --   --  1.12 1.17  TROPONINI <0.03 <0.03 <0.03 <0.03  --     Estimated Creatinine Clearance: 48.4 mL/min (by C-G formula based on SCr of 1.17 mg/dL).   Medical History: Past Medical History:  Diagnosis Date  . Abdominal aortic aneurysm ([place]) 01-2004   4.7 x 4.7 cm.  Marland Kitchen CAD (coronary artery disease) 01/25/2004   a. 01/2004 Ant MI/DES to LAD;  b. 10/2011 Neg MV.  . Chronic systolic heart failure ([place])    a. 08/2016 Echo: EF 35%, nl RV fxn, mod to sev MR, mild to mod TR, mod biatrial enlargement.  . Hyperlipidemia   . Ischemic cardiomyopathy    a. 08/2016 Echo: EF 35%.  . Moderate to Severe Mitral regurgitation    a. 08/2016 Echo: Mod-sev MR.  Marland Kitchen Permanent atrial fibrillation ([place])    a. Chronic coumadin (CHA2DS2VASc = 5).  . Pulmonary nodules    Noted on abdominal CT  . Statin intolerance     Assessment: Patient admitted with a therapeutic INR (2.30) on 2.5mg  daily except 1.25mg  MWF.   Goal of Therapy:  INR 2-3 Monitor platelets by anticoagulation protocol: Yes   Plan:  INR therapeutic. Continue pt home dose. INR/CBC in the AM.  Ramond Dial, Pharm.D, BCPS Clinical Pharmacist  12/06/2017,7:39 AM

## 2017-12-11 ENCOUNTER — Other Ambulatory Visit
Admission: RE | Admit: 2017-12-11 | Discharge: 2017-12-11 | Disposition: A | Discharge status: Home / Self Care | Payer: Medicare Other | Source: Ambulatory Visit | Attending: Family | Admitting: Family

## 2017-12-11 ENCOUNTER — Encounter: Payer: Self-pay | Admitting: Family

## 2017-12-11 ENCOUNTER — Ambulatory Visit: Payer: Medicare Other | Attending: Family | Admitting: Family

## 2017-12-11 VITALS — BP 97/52 | HR 79 | Resp 18 | Ht 73.0 in | Wt 194.5 lb

## 2017-12-11 DIAGNOSIS — I2510 Atherosclerotic heart disease of native coronary artery without angina pectoris: Secondary | ICD-10-CM | POA: Diagnosis not present

## 2017-12-11 DIAGNOSIS — E785 Hyperlipidemia, unspecified: Secondary | ICD-10-CM | POA: Insufficient documentation

## 2017-12-11 DIAGNOSIS — I5022 Chronic systolic (congestive) heart failure: Secondary | ICD-10-CM | POA: Insufficient documentation

## 2017-12-11 DIAGNOSIS — Z791 Long term (current) use of non-steroidal anti-inflammatories (NSAID): Secondary | ICD-10-CM | POA: Insufficient documentation

## 2017-12-11 DIAGNOSIS — I959 Hypotension, unspecified: Secondary | ICD-10-CM | POA: Insufficient documentation

## 2017-12-11 DIAGNOSIS — I4891 Unspecified atrial fibrillation: Secondary | ICD-10-CM | POA: Insufficient documentation

## 2017-12-11 DIAGNOSIS — Z7901 Long term (current) use of anticoagulants: Secondary | ICD-10-CM | POA: Insufficient documentation

## 2017-12-11 DIAGNOSIS — Z955 Presence of coronary angioplasty implant and graft: Secondary | ICD-10-CM | POA: Diagnosis not present

## 2017-12-11 DIAGNOSIS — I110 Hypertensive heart disease with heart failure: Secondary | ICD-10-CM | POA: Insufficient documentation

## 2017-12-11 DIAGNOSIS — Z87891 Personal history of nicotine dependence: Secondary | ICD-10-CM | POA: Diagnosis not present

## 2017-12-11 DIAGNOSIS — I4821 Permanent atrial fibrillation: Secondary | ICD-10-CM

## 2017-12-11 DIAGNOSIS — I950 Idiopathic hypotension: Secondary | ICD-10-CM

## 2017-12-11 DIAGNOSIS — Z79899 Other long term (current) drug therapy: Secondary | ICD-10-CM | POA: Diagnosis not present

## 2017-12-11 LAB — BASIC METABOLIC PANEL
ANION GAP: 9 (ref 5–15)
BUN: 31 mg/dL — ABNORMAL HIGH (ref 6–20)
CALCIUM: 9.1 mg/dL (ref 8.9–10.3)
CO2: 23 mmol/L (ref 22–32)
Chloride: 105 mmol/L (ref 101–111)
Creatinine, Ser: 1.34 mg/dL — ABNORMAL HIGH (ref 0.61–1.24)
GFR, EST AFRICAN AMERICAN: 52 mL/min — AB (ref >60)
GFR, EST NON AFRICAN AMERICAN: 45 mL/min — AB (ref >60)
GLUCOSE: 101 mg/dL — AB (ref 65–99)
POTASSIUM: 4.4 mmol/L (ref 3.5–5.1)
Sodium: 137 mmol/L (ref 135–145)

## 2017-12-11 NOTE — Patient Instructions (Addendum)
Continue weighing daily and call for an overnight weight gain of > 2 pounds or a weekly weight gain of >5 pounds.  Decrease metoprolol to 12.5mg  once daily. Cut current tablet in 1/2 and take 1/2 tablet daily.   Begin digoxin 0.125mg  once daily. Will check a digoxin level in about 2 weeks.

## 2017-12-11 NOTE — Progress Notes (Signed)
Patient ID: Joseph Barton, male    DOB: July 17, 1926, 84 y.o.   MRN: [id number]  HPI  Joseph Barton is an 84 yo male with a PMH of chronic a fib, chronic systolic heart failure, aortic aneurysm, CAD, HTN, previous tobacco use and hyperlipidemia.   ECHO 12/05/17 showed EF of 25-30% and moderate MVR and TVR. PA peak pressure moderately to severely elevated at 61 mmHg.   Admitted 12/04/17 with worsening dyspnea and chest pain and found to have an acute exacerbation of heart failure. While admitted, he was treated with IV Lasix and his digoxin was discontinued due to concerns of bradycardia. Cardiology was consulted during admission. He was discharged after 2 days.   He presents today for his initial visit with a chief complaint of minimal dyspnea on moderate exertion. He denies SOB at rest. He does not appear to be fluid overloaded. He has associated fatigue and dizziness with position change. He denies any chest pain, palpitations, abdominal distention, or weight gain.   Past Medical History:  Diagnosis Date  . Abdominal aortic aneurysm ([place]) 01-2004   4.7 x 4.7 cm.  Marland Kitchen CAD (coronary artery disease) 01/25/2004   a. 01/2004 Ant MI/DES to LAD;  b. 10/2011 Neg MV.  . CHF (congestive heart failure) ([place])   . Chronic systolic heart failure ([place])    a. 08/2016 Echo: EF 35%, nl RV fxn, mod to sev MR, mild to mod TR, mod biatrial enlargement.  . Hyperlipidemia   . Ischemic cardiomyopathy    a. 08/2016 Echo: EF 35%.  . Moderate to Severe Mitral regurgitation    a. 08/2016 Echo: Mod-sev MR.  Marland Kitchen Permanent atrial fibrillation ([place])    a. Chronic coumadin (CHA2DS2VASc = 5).  . Pulmonary nodules    Noted on abdominal CT  . Statin intolerance    Past Surgical History:  Procedure Laterality Date  . ABDOMINAL AORTIC ANEURYSM REPAIR     12/02/2007 [facility]  . ABDOMINAL AORTIC ANEURYSM REPAIR  2006   [facility]   . CARDIAC CATHETERIZATION  2009   [facility]  . CORONARY ANGIOPLASTY WITH STENT PLACEMENT  2005   [facility]  stent LAD   . [place] stents to LAD     01/25/2004  . SKIN SURGERY  2016   [facility] skin cancer   Family History  Problem Relation Age of Onset  . Heart attack Father 42       Died w/ Heart attack  . Diabetes Father   . Heart attack Brother   . Heart disease Brother   . Diabetes Sister   . Heart disease Sister    Social History   Tobacco Use  . Smoking status: Former Smoker    Last attempt to quit: 12/01/1987    Years since quitting: 30.0  . Smokeless tobacco: Never Used  Substance Use Topics  . Alcohol use: No   Allergies  Allergen Reactions  . Atorvastatin   . Simvastatin    Prior to Admission medications   Medication Sig Start Date End Date Taking? Authorizing Provider  albuterol (PROVENTIL HFA;VENTOLIN HFA) 108 (90 Base) MCG/ACT inhaler Inhale 2 puffs into the lungs every 6 (six) hours as needed for wheezing or shortness of breath. 11/29/17  Yes Jerrol Banana., MD  Bioflavonoid Products (BIOFLEX PO) Take 1 tablet by mouth daily.    Yes [provider]  digoxin (LANOXIN) 0.125 MG tablet Take 0.125 mg by mouth daily.   Yes [provider]  furosemide (LASIX) 20 MG  tablet Take 2 tablets (40 mg total) by mouth 2 (two) times daily. 12/06/17 02/04/18 Yes Sainani, Belia Heman, MD  lisinopril (PRINIVIL,ZESTRIL) 2.5 MG tablet Take 1 tablet (2.5 mg total) by mouth daily. 12/07/17 02/05/18 Yes Sainani, Belia Heman, MD  metoprolol succinate (TOPROL-XL) 25 MG 24 hr tablet Take 12.5 mg by mouth daily.   Yes [provider]  omeprazole (PRILOSEC) 20 MG capsule TAKE 1 CAPSULE BY MOUTH  DAILY 04/24/17  Yes Jerrol Banana., MD  potassium chloride 20 MEQ TBCR Take 20 mEq by mouth daily. 12/06/17 02/04/18 Yes Sainani, Belia Heman, MD  warfarin (COUMADIN) 2.5 MG tablet Take 1 tablet (2.5 mg total) by mouth as directed. Patient taking differently: Take  tablet (1.25MG ) by mouth Monday, Wednesday, Friday and take 1 tablet (2.5MG ) by mouth Tuesday, Thursday, Saturday and Sunday  10/03/17  Yes Jerrol Banana., MD    Review of Systems  Constitutional: Positive for fatigue. Negative for appetite change.  HENT: Negative for congestion, rhinorrhea and sore throat.   Eyes: Negative.   Respiratory: Positive for shortness of breath. Negative for chest tightness.   Cardiovascular: Negative for chest pain, palpitations and leg swelling.  Gastrointestinal: Negative for abdominal distention and abdominal pain.  Endocrine: Negative.   Genitourinary: Negative.   Musculoskeletal: Negative for back pain and neck pain.  Skin: Negative.   Allergic/Immunologic: Negative.   Neurological: Positive for dizziness. Negative for light-headedness.  Hematological: Negative for adenopathy. Does not bruise/bleed easily.  Psychiatric/Behavioral: Negative for dysphoric mood and sleep disturbance. The patient is not nervous/anxious.     Vitals:   12/11/17 1343  BP: (!) 97/52  Pulse: 79  Resp: 18  SpO2: 95%  Weight: 194 lb 8 oz (88.2 kg)  Height: 6\' 1"  (1.854 m)   Wt Readings from Last 3 Encounters:  12/11/17 194 lb 8 oz (88.2 kg)  12/06/17 198 lb 9.6 oz (90.1 kg)  11/15/17 194 lb 4 oz (88.1 kg)    Lab Results  Component Value Date   CREATININE 1.17 12/06/2017   CREATININE 1.12 12/05/2017   CREATININE 1.37 (H) 18-Jun-202019   Physical Exam  Constitutional: He is oriented to person, place, and time. He appears well-developed and well-nourished.  HENT:  Head: Normocephalic and atraumatic.  Neck: Normal range of motion. Neck supple. No JVD present.  Cardiovascular: Normal rate. An irregular rhythm present.  Pulmonary/Chest: Effort normal. No respiratory distress. He has no wheezes. He has no rales.  Abdominal: Soft. He exhibits no distension.  Musculoskeletal:       Right lower leg: He exhibits no tenderness and no edema.       Left lower leg: He exhibits no tenderness and no edema.  Neurological: He is alert and oriented to person, place, and time.  Skin: Skin is warm  and dry.  Psychiatric: He has a normal mood and affect. His behavior is normal.  Nursing note and vitals reviewed.    Assessment & Plan:  1. Chronic Heart Failure with reduced ejection fraction- - NYHA class II - euvolemic today - improved SOB since discharge - weighing daily and brought log. Educated on importance of daily weights and need to call clinic if overnight weight gain of 2-3 lbs or 5 lbs in a week - educated about salt (< 2000mg  daily) and fluid restriction; his wife manages his diet. Written dietary information was given to him about this - at discharge from hospital was given higher dose of Lasix; patient is tolerating well; may be able  to decrease this at future visits - digoxin discontinued during hospital stay. Patient was not sure why. Explained that note stated it was due to bradycardia. Patient expressed that if possible, he would like to be back on digoxin  - will decrease metoprolol succinate to 12.5mg  daily and start patient on previous digoxin dose of 0.125mg  daily; will obtain follow-up digoxin level in 2 weeks as clinic is closed next week - last saw cardiologist (Dr. Caryl Comes) on 11/15/17 - last BMP 12/06/17 showed Na 139, K 3.3, GFR 53, and Scr 1.17 - will obtain BMP today   2. Hypotension- - BP low today at 97/52 - patient states that he feels dizzy when he bends over and comes back up too quickly - likely related to recent increase in lasix dose  - decreased metoprolol succinate to 12.5mg  daily so that could start digoxin - will monitor - metoprolol succinate and lisinopril 2.5mg  daily - will re-evaluate need for further adjustment of meds at next appointment  3. A fib- - rate control with metoprolol succinate - restarting digoxin - anticoag with warfarin - last INR 12/05/17 was 2.77 - last saw PCP (Dr. Rosanna Randy 11/05/17); has appointment this Thursday 12/13/17  Return 01/16/18 for follow up appointment or sooner if problems arise.   Lendon Ka,  PharmD Pharmacy Resident

## 2017-12-12 ENCOUNTER — Encounter: Payer: Self-pay | Admitting: Family

## 2017-12-12 DIAGNOSIS — I959 Hypotension, unspecified: Secondary | ICD-10-CM | POA: Insufficient documentation

## 2017-12-12 NOTE — Progress Notes (Signed)
Patient: Joseph Barton Male    DOB: 10/16/1926   84 y.o.   MRN: [id number] Visit Date: 12/13/2017  Today's Provider: Wilhemena Durie, MD   Chief Complaint  Patient presents with  . Follow-up   Subjective:    HPI Patient is a 84 year old male who presents today for a follow up.  He was last seen in the office 1 month ago due to having shortness of breath. Due to symptoms of CHF and hypotension. Patient was advised to discontinue Lisinopril and follow up in 1 month.  A few days later, the patient called the office stating that the shortness of breath has not improved. Patient was prescribed an Albuterol inhaler, and was advised to continue taking Lasix 80mg  daily.   5 days later, the patient called saying that he has a squeezing sensation in his chest. It was recommended that he go to the ER.  Patient was admitted into [facility] on 12/04/17 due to CHF. He was advised to stop digoxin 0.125mg  daily and continue all other meds. He was then scheduled to F/U with cardiology for further evaluation.   He saw the cardiologist on (12/11/2017), and due to consistent low BP, patient was advised to decrease metoprolol to 12.5mg  daily, decrease lisinopril to 2.5mg  daily, and start back on digoxin 0.125mg  daily.   Patient is also due for recheck PT/INR. His last INR was while he was admitted at the hospital on 12/06/17 which was 2.3. He is currently taking Coumadin 2.5mg  daily except 1.25mg  on MWF.   Today his INR was 2.3 and PT was 29.8. Pt reports that his cardiologist restarted him on his lisinopril. His BP has been running 97/50-60 at home. Pt reports that he just feels tired and short of breath. Pt reports that his cardiologist wants his digoxin levels and kidneys checked in a couple of weeks by Dr. Rosanna Randy. denies dizziness and chest pain.     Allergies  Allergen Reactions  . Atorvastatin   . Simvastatin      Current Outpatient Medications:  .  albuterol (PROVENTIL HFA;VENTOLIN HFA) 108 (90  Base) MCG/ACT inhaler, Inhale 2 puffs into the lungs every 6 (six) hours as needed for wheezing or shortness of breath., Disp: 1 Inhaler, Rfl: 0 .  Bioflavonoid Products (BIOFLEX PO), Take 1 tablet by mouth daily. , Disp: , Rfl:  .  digoxin (LANOXIN) 0.125 MG tablet, Take 0.125 mg by mouth daily., Disp: , Rfl:  .  furosemide (LASIX) 20 MG tablet, Take 2 tablets (40 mg total) by mouth 2 (two) times daily., Disp: 120 tablet, Rfl: 1 .  lisinopril (PRINIVIL,ZESTRIL) 2.5 MG tablet, Take 1 tablet (2.5 mg total) by mouth daily., Disp: 30 tablet, Rfl: 1 .  metoprolol succinate (TOPROL-XL) 25 MG 24 hr tablet, Take 12.5 mg by mouth daily., Disp: , Rfl:  .  omeprazole (PRILOSEC) 20 MG capsule, TAKE 1 CAPSULE BY MOUTH  DAILY, Disp: 90 capsule, Rfl: 3 .  warfarin (COUMADIN) 2.5 MG tablet, Take 1 tablet (2.5 mg total) by mouth as directed. (Patient taking differently: Take  tablet (1.25MG ) by mouth Monday, Wednesday, Friday and take 1 tablet (2.5MG ) by mouth Tuesday, Thursday, Saturday and Sunday), Disp: 30 tablet, Rfl: 12 .  potassium chloride 20 MEQ TBCR, Take 20 mEq by mouth daily., Disp: 60 tablet, Rfl: 1  Review of Systems  Constitutional: Positive for fatigue.  HENT: Negative.   Eyes: Negative.   Respiratory: Positive for shortness of breath.  Cardiovascular: Negative.   Gastrointestinal: Negative.   Endocrine: Negative.   Genitourinary: Negative.   Musculoskeletal: Negative.   Skin: Negative.   Allergic/Immunologic: Negative.   Neurological: Negative.   Hematological: Negative.   Psychiatric/Behavioral: Negative.     Social History   Tobacco Use  . Smoking status: Former Smoker    Last attempt to quit: 12/01/1987    Years since quitting: 30.0  . Smokeless tobacco: Never Used  Substance Use Topics  . Alcohol use: No   Objective:   BP 108/64 (BP Location: Left Arm, Patient Position: Sitting, Cuff Size: Normal)   Pulse 86   Temp 97.6 F (36.4 C) (Oral)   Resp 14   Wt 195 lb (88.5  kg)   SpO2 96%   BMI 25.73 kg/m  Vitals:   12/13/17 0831  BP: 108/64  Pulse: 86  Resp: 14  Temp: 97.6 F (36.4 C)  TempSrc: Oral  SpO2: 96%  Weight: 195 lb (88.5 kg)     Physical Exam  Constitutional: He is oriented to person, place, and time. He appears well-developed and well-nourished.  HENT:  Head: Normocephalic and atraumatic.  Right Ear: External ear normal.  Left Ear: External ear normal.  Nose: Nose normal.  Eyes: Conjunctivae are normal. No scleral icterus.  Neck: No JVD present. No thyromegaly present.  Cardiovascular: Normal rate, regular rhythm and normal heart sounds.  Pulmonary/Chest: Effort normal and breath sounds normal.  Abdominal: Soft.  Lymphadenopathy:    He has no cervical adenopathy.  Neurological: He is alert and oriented to person, place, and time.  Skin: Skin is warm and dry.  Psychiatric: He has a normal mood and affect. His behavior is normal. Judgment and thought content normal.        Assessment & Plan:     1. Chronic atrial fibrillation (HCC) RTC 1 month. - POCT INR--2.5 today - Digoxin level  2. Acute systolic congestive heart failure ([name])   3. Essential hypertension  - Renal function panel 4.Chronic CHF with EF 25-30% BP too low for Entresto.Weigh daily. 5.ASCVD 6.Ischemic Cardiomyopathy     I have done the exam and reviewed the above chart and it is accurate to the best of my knowledge. Development worker, community has been used in this note in any air is in the dictation or transcription are unintentional.  Wilhemena Durie, MD  [place]

## 2017-12-13 ENCOUNTER — Encounter: Payer: Self-pay | Admitting: Family Medicine

## 2017-12-13 ENCOUNTER — Ambulatory Visit (INDEPENDENT_AMBULATORY_CARE_PROVIDER_SITE_OTHER): Payer: Medicare Other | Admitting: Family Medicine

## 2017-12-13 VITALS — BP 108/64 | HR 86 | Temp 97.6°F | Resp 14 | Wt 195.0 lb

## 2017-12-13 DIAGNOSIS — I255 Ischemic cardiomyopathy: Secondary | ICD-10-CM | POA: Diagnosis not present

## 2017-12-13 DIAGNOSIS — I5022 Chronic systolic (congestive) heart failure: Secondary | ICD-10-CM

## 2017-12-13 DIAGNOSIS — I482 Chronic atrial fibrillation, unspecified: Secondary | ICD-10-CM

## 2017-12-13 DIAGNOSIS — I10 Essential (primary) hypertension: Secondary | ICD-10-CM | POA: Diagnosis not present

## 2017-12-13 DIAGNOSIS — I714 Abdominal aortic aneurysm, without rupture, unspecified: Secondary | ICD-10-CM

## 2017-12-13 DIAGNOSIS — I5021 Acute systolic (congestive) heart failure: Secondary | ICD-10-CM | POA: Diagnosis not present

## 2017-12-13 LAB — POCT INR
INR: 2.5 (ref 2.0–3.0)
PT: 29.8

## 2017-12-24 DIAGNOSIS — H903 Sensorineural hearing loss, bilateral: Secondary | ICD-10-CM | POA: Diagnosis not present

## 2017-12-24 DIAGNOSIS — H6122 Impacted cerumen, left ear: Secondary | ICD-10-CM | POA: Diagnosis not present

## 2017-12-25 DIAGNOSIS — I10 Essential (primary) hypertension: Secondary | ICD-10-CM | POA: Diagnosis not present

## 2017-12-25 DIAGNOSIS — I482 Chronic atrial fibrillation: Secondary | ICD-10-CM | POA: Diagnosis not present

## 2017-12-26 ENCOUNTER — Telehealth: Payer: Self-pay

## 2017-12-26 LAB — DIGOXIN LEVEL: DIGOXIN, SERUM: 1.8 ng/mL — AB (ref 0.5–0.9)

## 2017-12-26 LAB — RENAL FUNCTION PANEL
Albumin: 4.3 g/dL (ref 3.5–4.7)
BUN / CREAT RATIO: 17 (ref 10–24)
BUN: 22 mg/dL (ref 8–27)
CO2: 23 mmol/L (ref 20–29)
CREATININE: 1.28 mg/dL — AB (ref 0.76–1.27)
Calcium: 9.2 mg/dL (ref 8.6–10.2)
Chloride: 103 mmol/L (ref 96–106)
GFR, EST AFRICAN AMERICAN: 57 mL/min/{1.73_m2} — AB (ref >59)
GFR, EST NON AFRICAN AMERICAN: 49 mL/min/{1.73_m2} — AB (ref >59)
Glucose: 84 mg/dL (ref 65–99)
Phosphorus: 2.5 mg/dL (ref 2.5–4.5)
Potassium: 4.4 mmol/L (ref 3.5–5.2)
SODIUM: 140 mmol/L (ref 134–144)

## 2017-12-26 NOTE — Telephone Encounter (Signed)
LMTCB

## 2017-12-26 NOTE — Telephone Encounter (Signed)
Patient advised. He verbalized understanding.  

## 2017-12-26 NOTE — Telephone Encounter (Signed)
-----   Message from Jerrol Banana., MD sent at 12/26/2017  9:50 AM EDT ----- Cut digoxin dose in half.

## 2017-12-26 NOTE — Telephone Encounter (Signed)
Pt returned call ° °teri °

## 2018-01-15 ENCOUNTER — Ambulatory Visit (INDEPENDENT_AMBULATORY_CARE_PROVIDER_SITE_OTHER): Payer: Medicare Other | Admitting: Family Medicine

## 2018-01-15 ENCOUNTER — Encounter: Payer: Self-pay | Admitting: Family Medicine

## 2018-01-15 VITALS — BP 98/62 | HR 88 | Temp 98.2°F | Resp 20 | Ht 73.0 in | Wt 195.0 lb

## 2018-01-15 DIAGNOSIS — I2510 Atherosclerotic heart disease of native coronary artery without angina pectoris: Secondary | ICD-10-CM | POA: Diagnosis not present

## 2018-01-15 DIAGNOSIS — Z9861 Coronary angioplasty status: Secondary | ICD-10-CM

## 2018-01-15 DIAGNOSIS — I482 Chronic atrial fibrillation, unspecified: Secondary | ICD-10-CM

## 2018-01-15 DIAGNOSIS — I10 Essential (primary) hypertension: Secondary | ICD-10-CM

## 2018-01-15 DIAGNOSIS — I255 Ischemic cardiomyopathy: Secondary | ICD-10-CM | POA: Diagnosis not present

## 2018-01-15 DIAGNOSIS — I5021 Acute systolic (congestive) heart failure: Secondary | ICD-10-CM

## 2018-01-15 LAB — POCT INR
INR: 2.3 (ref 2.0–3.0)
PT: 27.2

## 2018-01-15 NOTE — Progress Notes (Signed)
Patient: Joseph Barton Male    DOB: Dec 11, 1926   84 y.o.   MRN: [id number] Visit Date: 01/15/2018  Today's Provider: Wilhemena Durie, MD   Chief Complaint  Patient presents with  . Atrial Fibrillation  . Hypertension  . Congestive Heart Failure   Subjective:    HPI Patient comes in today for a follow up. He was seen in the office 1 month ago.   Since last visit, he was advised to cut digoxin dose in half. He reports that he is tolerating med changes well.   He is also due for a recheck PT/INR today. He is currently taking Coumadin 2.5mg  daily except 1.25mg  on MWF. He denies any abnormal bleeding, and reports good compliance with medications.  Lab Results  Component Value Date   INR 2.3 01/15/2018   INR 2.5 12/13/2017   INR 2.30 12/06/2017     He has had minimal swelling in legs and feet. However, he does mention that he gets light headed when going from sitting to standing position.     Allergies  Allergen Reactions  . Atorvastatin   . Simvastatin      Current Outpatient Medications:  .  albuterol (PROVENTIL HFA;VENTOLIN HFA) 108 (90 Base) MCG/ACT inhaler, Inhale 2 puffs into the lungs every 6 (six) hours as needed for wheezing or shortness of breath., Disp: 1 Inhaler, Rfl: 0 .  Bioflavonoid Products (BIOFLEX PO), Take 1 tablet by mouth daily. , Disp: , Rfl:  .  digoxin (LANOXIN) 0.125 MG tablet, Take 0.125 mg by mouth daily., Disp: , Rfl:  .  furosemide (LASIX) 20 MG tablet, Take 2 tablets (40 mg total) by mouth 2 (two) times daily., Disp: 120 tablet, Rfl: 1 .  lisinopril (PRINIVIL,ZESTRIL) 2.5 MG tablet, Take 1 tablet (2.5 mg total) by mouth daily., Disp: 30 tablet, Rfl: 1 .  metoprolol succinate (TOPROL-XL) 25 MG 24 hr tablet, Take 12.5 mg by mouth daily., Disp: , Rfl:  .  omeprazole (PRILOSEC) 20 MG capsule, TAKE 1 CAPSULE BY MOUTH  DAILY, Disp: 90 capsule, Rfl: 3 .  potassium chloride 20 MEQ TBCR, Take 20 mEq by mouth daily., Disp: 60 tablet, Rfl: 1 .   warfarin (COUMADIN) 2.5 MG tablet, Take 1 tablet (2.5 mg total) by mouth as directed. (Patient taking differently: Take  tablet (1.25MG ) by mouth Monday, Wednesday, Friday and take 1 tablet (2.5MG ) by mouth Tuesday, Thursday, Saturday and Sunday), Disp: 30 tablet, Rfl: 12  Review of Systems  Constitutional: Positive for fatigue. Negative for activity change, appetite change, chills, diaphoresis, fever and unexpected weight change.  HENT: Negative.   Eyes: Negative.   Respiratory: Negative for cough, shortness of breath and wheezing.   Cardiovascular: Negative for chest pain, palpitations and leg swelling.  Gastrointestinal: Negative.   Endocrine: Negative.   Musculoskeletal: Negative.   Allergic/Immunologic: Negative.   Neurological: Positive for light-headedness. Negative for dizziness and headaches.  Psychiatric/Behavioral: Negative.     Social History   Tobacco Use  . Smoking status: Former Smoker    Last attempt to quit: 12/01/1987    Years since quitting: 30.1  . Smokeless tobacco: Never Used  Substance Use Topics  . Alcohol use: No   Objective:   BP 98/62 (BP Location: Left Arm, Patient Position: Sitting, Cuff Size: Normal)   Pulse 88   Temp 98.2 F (36.8 C)   Resp 20   Ht 6\' 1"  (1.854 m)   Wt 195 lb (88.5 kg)   SpO2  96%   BMI 25.73 kg/m  Vitals:   01/15/18 0919  BP: 98/62  Pulse: 88  Resp: 20  Temp: 98.2 F (36.8 C)  SpO2: 96%  Weight: 195 lb (88.5 kg)  Height: 6\' 1"  (1.854 m)     Physical Exam  Constitutional: He is oriented to person, place, and time. He appears well-developed and well-nourished.  HENT:  Head: Normocephalic and atraumatic.  Right Ear: External ear normal.  Left Ear: External ear normal.  Nose: Nose normal.  Eyes: Conjunctivae are normal. No scleral icterus.  Neck: No thyromegaly present.  Cardiovascular: Normal rate, regular rhythm and normal heart sounds.  Pulmonary/Chest: Effort normal and breath sounds normal.  Abdominal: Soft.   Musculoskeletal: He exhibits no edema.  Neurological: He is alert and oriented to person, place, and time.  Skin: Skin is warm and dry.  Psychiatric: He has a normal mood and affect. His behavior is normal. Judgment and thought content normal.        Assessment & Plan:     1. Chronic atrial fibrillation (HCC)  - POCT INR--2.3 today.  2. Essential hypertension   3. Acute systolic congestive heart failure (HCC) BP too low to use Entresto.Cut down to just lasix each morning. 4.CAD Risk factors treated.     I have done the exam and reviewed the above chart and it is accurate to the best of my knowledge. Development worker, community has been used in this note in any air is in the dictation or transcription are unintentional.  Wilhemena Durie, MD  [place]

## 2018-01-15 NOTE — Patient Instructions (Signed)
Decrease Lasix to morning dose only.

## 2018-01-16 ENCOUNTER — Ambulatory Visit: Payer: Medicare Other | Attending: Family | Admitting: Family

## 2018-01-16 ENCOUNTER — Encounter: Payer: Self-pay | Admitting: Family

## 2018-01-16 VITALS — BP 121/62 | HR 87 | Resp 18 | Ht 73.0 in | Wt 193.4 lb

## 2018-01-16 DIAGNOSIS — I482 Chronic atrial fibrillation: Secondary | ICD-10-CM | POA: Diagnosis not present

## 2018-01-16 DIAGNOSIS — Z87891 Personal history of nicotine dependence: Secondary | ICD-10-CM | POA: Diagnosis not present

## 2018-01-16 DIAGNOSIS — Z79899 Other long term (current) drug therapy: Secondary | ICD-10-CM | POA: Insufficient documentation

## 2018-01-16 DIAGNOSIS — E785 Hyperlipidemia, unspecified: Secondary | ICD-10-CM | POA: Diagnosis not present

## 2018-01-16 DIAGNOSIS — Z9889 Other specified postprocedural states: Secondary | ICD-10-CM | POA: Insufficient documentation

## 2018-01-16 DIAGNOSIS — I714 Abdominal aortic aneurysm, without rupture: Secondary | ICD-10-CM | POA: Diagnosis not present

## 2018-01-16 DIAGNOSIS — Z7901 Long term (current) use of anticoagulants: Secondary | ICD-10-CM | POA: Insufficient documentation

## 2018-01-16 DIAGNOSIS — I2510 Atherosclerotic heart disease of native coronary artery without angina pectoris: Secondary | ICD-10-CM | POA: Insufficient documentation

## 2018-01-16 DIAGNOSIS — Z833 Family history of diabetes mellitus: Secondary | ICD-10-CM | POA: Insufficient documentation

## 2018-01-16 DIAGNOSIS — I5022 Chronic systolic (congestive) heart failure: Secondary | ICD-10-CM | POA: Diagnosis not present

## 2018-01-16 DIAGNOSIS — I959 Hypotension, unspecified: Secondary | ICD-10-CM | POA: Insufficient documentation

## 2018-01-16 DIAGNOSIS — Z09 Encounter for follow-up examination after completed treatment for conditions other than malignant neoplasm: Secondary | ICD-10-CM | POA: Insufficient documentation

## 2018-01-16 DIAGNOSIS — Z8249 Family history of ischemic heart disease and other diseases of the circulatory system: Secondary | ICD-10-CM | POA: Diagnosis not present

## 2018-01-16 DIAGNOSIS — I950 Idiopathic hypotension: Secondary | ICD-10-CM

## 2018-01-16 DIAGNOSIS — Z888 Allergy status to other drugs, medicaments and biological substances status: Secondary | ICD-10-CM | POA: Insufficient documentation

## 2018-01-16 DIAGNOSIS — I4821 Permanent atrial fibrillation: Secondary | ICD-10-CM

## 2018-01-16 NOTE — Progress Notes (Signed)
Patient ID: Joseph Barton, male    DOB: Apr 12, 1927, 84 y.o.   MRN: [id number]  HPI  Joseph Barton is an 84 yo male with a PMH of chronic a fib, chronic systolic heart failure, aortic aneurysm, CAD, HTN, previous tobacco use and hyperlipidemia.   ECHO 12/05/17 showed EF of 25-30% and moderate MVR and TVR. PA peak pressure moderately to severely elevated at 61 mmHg.   Admitted 12/04/17 with worsening dyspnea and chest pain and found to have an acute exacerbation of heart failure. While admitted, he was treated with IV Lasix and his digoxin was discontinued due to concerns of bradycardia. Cardiology was consulted during admission. He was discharged after 2 days.   He presents today for a follow-up visit with a chief complaint of minimal shortness of breath upon moderate exertion. He says this has been present for several years. He has associated fatigue and dizziness along with this. He denies difficulty sleeping, abdominal distention, palpitations, pedal edema, chest pain or weight gain.   Past Medical History:  Diagnosis Date  . Abdominal aortic aneurysm ([place]) 01-2004   4.7 x 4.7 cm.  Marland Kitchen CAD (coronary artery disease) 01/25/2004   a. 01/2004 Ant MI/DES to LAD;  b. 10/2011 Neg MV.  . CHF (congestive heart failure) ([place])   . Chronic systolic heart failure ([place])    a. 08/2016 Echo: EF 35%, nl RV fxn, mod to sev MR, mild to mod TR, mod biatrial enlargement.  . Hyperlipidemia   . Hypertension   . Ischemic cardiomyopathy    a. 08/2016 Echo: EF 35%.  . Moderate to Severe Mitral regurgitation    a. 08/2016 Echo: Mod-sev MR.  Marland Kitchen Permanent atrial fibrillation ([place])    a. Chronic coumadin (CHA2DS2VASc = 5).  . Pulmonary nodules    Noted on abdominal CT  . Statin intolerance    Past Surgical History:  Procedure Laterality Date  . ABDOMINAL AORTIC ANEURYSM REPAIR     12/02/2007 [facility]  . ABDOMINAL AORTIC ANEURYSM REPAIR  2006   [facility]   . CARDIAC CATHETERIZATION  2009   [facility]  . CORONARY ANGIOPLASTY  WITH STENT PLACEMENT  2005   Cypher stent LAD   . Cypher stents to LAD     01/25/2004  . SKIN SURGERY  2016   [facility] skin cancer   Family History  Problem Relation Age of Onset  . Heart attack Father 62       Died w/ Heart attack  . Diabetes Father   . Heart attack Brother   . Heart disease Brother   . Diabetes Sister   . Heart disease Sister    Social History   Tobacco Use  . Smoking status: Former Smoker    Last attempt to quit: 12/01/1987    Years since quitting: 30.1  . Smokeless tobacco: Never Used  Substance Use Topics  . Alcohol use: No   Allergies  Allergen Reactions  . Atorvastatin   . Simvastatin    Prior to Admission medications   Medication Sig Start Date End Date Taking? Authorizing Provider  albuterol (PROVENTIL HFA;VENTOLIN HFA) 108 (90 Base) MCG/ACT inhaler Inhale 2 puffs into the lungs every 6 (six) hours as needed for wheezing or shortness of breath. 11/29/17  Yes Jerrol Banana., MD  Bioflavonoid Products (BIOFLEX PO) Take 1 tablet by mouth daily.    Yes [provider]  digoxin (LANOXIN) 0.125 MG tablet Take 0.0625 mg by mouth daily.    Yes [provider]  furosemide (LASIX) 20 MG tablet Take 40 mg by mouth daily.   Yes [provider]  lisinopril (PRINIVIL,ZESTRIL) 2.5 MG tablet Take 1 tablet (2.5 mg total) by mouth daily. 12/07/17 02/05/18 Yes Sainani, Belia Heman, MD  metoprolol succinate (TOPROL-XL) 25 MG 24 hr tablet Take 12.5 mg by mouth daily.   Yes [provider]  omeprazole (PRILOSEC) 20 MG capsule TAKE 1 CAPSULE BY MOUTH  DAILY 04/24/17  Yes Jerrol Banana., MD  potassium chloride 20 MEQ TBCR Take 20 mEq by mouth daily. 12/06/17 02/04/18 Yes Sainani, Belia Heman, MD  warfarin (COUMADIN) 2.5 MG tablet Take 1 tablet (2.5 mg total) by mouth as directed. Patient taking differently: Take  tablet (1.25MG ) by mouth Monday, Wednesday, Friday and take 1 tablet (2.5MG ) by mouth Tuesday, Thursday, Saturday and Sunday 10/03/17   Yes Jerrol Banana., MD    Review of Systems  Constitutional: Positive for fatigue (minimal). Negative for appetite change.  HENT: Negative for congestion, rhinorrhea and sore throat.   Eyes: Negative.   Respiratory: Positive for shortness of breath. Negative for chest tightness.   Cardiovascular: Negative for chest pain, palpitations and leg swelling.  Gastrointestinal: Negative for abdominal distention and abdominal pain.  Endocrine: Negative.   Genitourinary: Negative.   Musculoskeletal: Negative for back pain and neck pain.  Skin: Negative.   Allergic/Immunologic: Negative.   Neurological: Positive for dizziness. Negative for light-headedness.  Hematological: Negative for adenopathy. Does not bruise/bleed easily.  Psychiatric/Behavioral: Negative for dysphoric mood and sleep disturbance. The patient is not nervous/anxious.     Vitals:   01/16/18 1245  Pulse: 87  Resp: 18  SpO2: 96%  Weight: 193 lb 6 oz (87.7 kg)  Height: 6\' 1"  (1.854 m)   Wt Readings from Last 3 Encounters:  01/16/18 193 lb 6 oz (87.7 kg)  01/15/18 195 lb (88.5 kg)  12/13/17 195 lb (88.5 kg)     Lab Results  Component Value Date   CREATININE 1.28 (H) 12/25/2017   CREATININE 1.34 (H) 12/11/2017   CREATININE 1.17 12/06/2017   Physical Exam  Constitutional: He is oriented to person, place, and time. He appears well-developed and well-nourished.  HENT:  Head: Normocephalic and atraumatic.  Neck: Normal range of motion. Neck supple. No JVD present.  Cardiovascular: Normal rate. An irregular rhythm present.  Pulmonary/Chest: Effort normal. No respiratory distress. He has no wheezes. He has no rales.  Abdominal: Soft. He exhibits no distension.  Musculoskeletal:       Right lower leg: He exhibits no tenderness and no edema.       Left lower leg: He exhibits no tenderness and no edema.  Neurological: He is alert and oriented to person, place, and time.  Skin: Skin is warm and dry.   Psychiatric: He has a normal mood and affect. His behavior is normal.  Nursing note and vitals reviewed.    Assessment & Plan:  1. Chronic Heart Failure with reduced ejection fraction- - NYHA class II - euvolemic today - weighing daily and reminded him too call clinic if overnight weight gain of 2-3 lbs or 5 lbs in a week - not adding salt and he says that they've been trying to read food labels - patient says that his digoxin has been decreased by PCP and that he feels better since starting the digoxin - last saw cardiologist (Dr. Caryl Comes) on 11/15/17 - last BMP 12/25/17 showed Na 140, K 4.4, GFR 49  2. Hypotension- - BP looks good  today - patient states that he feels dizzy when he bends over and comes back up too quickly; encouraged to change positions slowly - saw PCP Rosanna Randy) yesterday - metoprolol succinate and lisinopril 2.5mg  daily  3. A fib- - rate control with metoprolol succinate - dig level on 12/04/17 was 0.6 - anticoag with warfarin - last INR 01/15/18 was 2.3  Patient did not bring his medications nor a list. Each medication was verbally reviewed with the patient and he was encouraged to bring the bottles to every visit to confirm accuracy of list.  Return in 3 months or sooner for any questions/problems before then.

## 2018-01-16 NOTE — Patient Instructions (Signed)
Continue weighing daily and call for an overnight weight gain of > 2 pounds or a weekly weight gain of >5 pounds. 

## 2018-02-16 ENCOUNTER — Other Ambulatory Visit: Payer: Self-pay | Admitting: Internal Medicine

## 2018-02-16 ENCOUNTER — Other Ambulatory Visit: Payer: Self-pay | Admitting: Family Medicine

## 2018-02-16 DIAGNOSIS — I255 Ischemic cardiomyopathy: Secondary | ICD-10-CM

## 2018-02-16 DIAGNOSIS — K219 Gastro-esophageal reflux disease without esophagitis: Secondary | ICD-10-CM

## 2018-02-18 ENCOUNTER — Other Ambulatory Visit: Payer: Self-pay

## 2018-02-18 MED ORDER — METOPROLOL SUCCINATE ER 25 MG PO TB24: 12.5000 mg | ORAL_TABLET | Freq: Every day | ORAL | 0 refills | Status: DC

## 2018-02-18 MED ORDER — LISINOPRIL 2.5 MG PO TABS: 2.5000 mg | ORAL_TABLET | Freq: Every day | ORAL | 0 refills | Status: DC

## 2018-02-18 NOTE — Telephone Encounter (Signed)
Please advise if ok to refill Digoxin. Please see labs drawn by PCP.

## 2018-02-19 ENCOUNTER — Ambulatory Visit (INDEPENDENT_AMBULATORY_CARE_PROVIDER_SITE_OTHER): Payer: Medicare Other | Admitting: Family Medicine

## 2018-02-19 ENCOUNTER — Encounter: Payer: Self-pay | Admitting: Family Medicine

## 2018-02-19 ENCOUNTER — Telehealth: Payer: Self-pay | Admitting: *Deleted

## 2018-02-19 VITALS — BP 108/62 | HR 84 | Temp 97.5°F | Resp 16 | Ht 73.0 in | Wt 192.0 lb

## 2018-02-19 DIAGNOSIS — I482 Chronic atrial fibrillation, unspecified: Secondary | ICD-10-CM

## 2018-02-19 DIAGNOSIS — I10 Essential (primary) hypertension: Secondary | ICD-10-CM | POA: Diagnosis not present

## 2018-02-19 DIAGNOSIS — I509 Heart failure, unspecified: Secondary | ICD-10-CM | POA: Diagnosis not present

## 2018-02-19 LAB — POCT INR
INR: 2.6 (ref 2.0–3.0)
PT: 31.5

## 2018-02-19 NOTE — Telephone Encounter (Signed)
Digoxin level checked by PCP on 12/25/17 by Dr. Silvio Pate- 1.8. Patient was instructed to cut digoxin in half at the time. According to his med list he is now taking digoxin 0.125 mg - 1/2 tablet (0.0625 mg) once daily.  To Dr. Caryl Comes to review for refill.

## 2018-02-19 NOTE — Telephone Encounter (Signed)
[facility] left a msg on the refill vm requesting a call back at [phone] in regards to the rx's that they received for metoprolol with two different sigs. They need to know which is correct before they will ship any medication to the patient. Please use reference #[id number] when returning their call. Thanks, MI

## 2018-02-19 NOTE — Progress Notes (Signed)
Patient: Joseph Barton Male    DOB: May 02, 1927   84 y.o.   MRN: [id number] Visit Date: 02/19/2018  Today's Provider: Wilhemena Durie, MD   Chief Complaint  Patient presents with  . Atrial Fibrillation  . Coronary Artery Disease  . Hypotension   Subjective:    HPI Patient comes in today for a follow up. He was seen in the office 1 month ago.  Since last visit, he was advised to decrease Lasix to take in the mornings only. He was having hypotensive readings at home and in the office, and noted to have lightheadedness when going from sitting to standing position.  BP Readings from Last 3 Encounters:  02/19/18 108/62  01/16/18 121/62  01/15/18 98/62   He is also due for a recheck of his PT/INR today. He is currently taking Coumadin 2.5mg  daily, except 1.25mg  on MWF. He denies any abnormal bleeding, and reports good compliance.     Lab Results  Component Value Date   INR 2.6 02/19/2018   INR 2.3 01/15/2018   INR 2.5 12/13/2017     Allergies  Allergen Reactions  . Atorvastatin   . Simvastatin      Current Outpatient Medications:  .  albuterol (PROVENTIL HFA;VENTOLIN HFA) 108 (90 Base) MCG/ACT inhaler, Inhale 2 puffs into the lungs every 6 (six) hours as needed for wheezing or shortness of breath., Disp: 1 Inhaler, Rfl: 0 .  Bioflavonoid Products (BIOFLEX PO), Take 1 tablet by mouth daily. , Disp: , Rfl:  .  digoxin (LANOXIN) 0.125 MG tablet, Take 0.0625 mg by mouth daily. , Disp: , Rfl:  .  furosemide (LASIX) 20 MG tablet, Take 40 mg by mouth daily., Disp: , Rfl:  .  lisinopril (PRINIVIL,ZESTRIL) 2.5 MG tablet, Take 1 tablet (2.5 mg total) by mouth daily., Disp: 90 tablet, Rfl: 0 .  metoprolol succinate (TOPROL-XL) 25 MG 24 hr tablet, Take 0.5 tablets (12.5 mg total) by mouth daily., Disp: 90 tablet, Rfl: 0 .  omeprazole (PRILOSEC) 20 MG capsule, TAKE 1 CAPSULE BY MOUTH  DAILY, Disp: 90 capsule, Rfl: 3 .  warfarin (COUMADIN) 2.5 MG tablet, Take 1 tablet (2.5 mg  total) by mouth as directed. (Patient taking differently: Take  tablet (1.25MG ) by mouth Monday, Wednesday, Friday and take 1 tablet (2.5MG ) by mouth Tuesday, Thursday, Saturday and Sunday), Disp: 30 tablet, Rfl: 12 .  potassium chloride 20 MEQ TBCR, Take 20 mEq by mouth daily., Disp: 60 tablet, Rfl: 1  Review of Systems  Constitutional: Negative for activity change, appetite change, chills, diaphoresis, fatigue, fever and unexpected weight change.  Respiratory: Negative for cough and shortness of breath.   Cardiovascular: Negative for chest pain and leg swelling.  Endocrine: Negative for cold intolerance, heat intolerance, polydipsia, polyphagia and polyuria.  Genitourinary: Negative.   Musculoskeletal: Positive for arthralgias and back pain. Negative for joint swelling and myalgias.  Allergic/Immunologic: Negative.   Hematological: Bruises/bleeds easily.  Psychiatric/Behavioral: Negative.     Social History   Tobacco Use  . Smoking status: Former Smoker    Last attempt to quit: 12/01/1987    Years since quitting: 30.2  . Smokeless tobacco: Never Used  Substance Use Topics  . Alcohol use: No   Objective:   BP 108/62   Pulse 84   Temp (!) 97.5 F (36.4 C)   Resp 16   Ht 6\' 1"  (1.854 m)   Wt 192 lb (87.1 kg)   SpO2 96%   BMI 25.33  kg/m  Vitals:   02/19/18 0929  BP: 108/62  Pulse: 84  Resp: 16  Temp: (!) 97.5 F (36.4 C)  SpO2: 96%  Weight: 192 lb (87.1 kg)  Height: 6\' 1"  (1.854 m)     Physical Exam  Constitutional: He is oriented to person, place, and time. He appears well-developed and well-nourished.  HENT:  Head: Normocephalic and atraumatic.  Eyes: Conjunctivae are normal. No scleral icterus.  Neck: No thyromegaly present.  Cardiovascular: Normal rate, regular rhythm and normal heart sounds.  Pulmonary/Chest: Effort normal and breath sounds normal.  Abdominal: Soft.  Musculoskeletal: He exhibits no edema.  Neurological: He is alert and oriented to person,  place, and time.  Skin: Skin is warm and dry.  Psychiatric: He has a normal mood and affect. His behavior is normal. Judgment and thought content normal.        Assessment & Plan:    1. Chronic atrial fibrillation (HCC) Feels better on lower dose Digoxin. - POCT INR  2. Essential hypertension   3. Chronic congestive heart failure, unspecified heart failure type ([place]) Clinically improved.Followed by CHF clinic.         Brianna Esson Cranford Mon, MD  [facility]

## 2018-02-19 NOTE — Telephone Encounter (Signed)
Spoke with Sophia with [facility] regarding the correct dose of Metoprolol 25 mg take 1/2 tablet daily #45.

## 2018-02-22 ENCOUNTER — Other Ambulatory Visit: Payer: Self-pay | Admitting: *Deleted

## 2018-02-22 DIAGNOSIS — Z79899 Other long term (current) drug therapy: Secondary | ICD-10-CM

## 2018-02-22 DIAGNOSIS — I4821 Permanent atrial fibrillation: Secondary | ICD-10-CM

## 2018-02-22 NOTE — Telephone Encounter (Signed)
Dr. Caryl Comes wants [name] to have a digoxin level checked.Please get requisition ready and call patient when ready.

## 2018-02-22 NOTE — Telephone Encounter (Signed)
I spoke with the patient and his wife.  They were requesting to have labs done with Dr. Rosanna Randy. I called Dr. [name] office but they do not accept lab orders from other physicians. They advised the patient go to the [place] on [address] or [facility].  I have called and notified Mrs. Armentrout of the above. She states they will go to the lab at the hospital as they are not quite certain where the [place] facilities are located. I advised her that we typically send our patients to the [place] at [facility] for walk in labs at the hospital. Mrs. Santilli confirmed they will go there to have this done, but it may be Monday before they can go.   Order placed.   I advised Mrs. Armwood I will look at for the patient's results so I know if we can refill his digoxin or not.  Mrs. Mestre voices understanding and is agreeable.

## 2018-02-25 ENCOUNTER — Other Ambulatory Visit
Admission: RE | Admit: 2018-02-25 | Discharge: 2018-02-25 | Disposition: A | Discharge status: Home / Self Care | Payer: Medicare Other | Source: Ambulatory Visit | Attending: Internal Medicine | Admitting: Internal Medicine

## 2018-02-25 DIAGNOSIS — Z79899 Other long term (current) drug therapy: Secondary | ICD-10-CM | POA: Diagnosis not present

## 2018-02-25 DIAGNOSIS — I4821 Permanent atrial fibrillation: Secondary | ICD-10-CM

## 2018-02-25 DIAGNOSIS — I482 Chronic atrial fibrillation: Secondary | ICD-10-CM | POA: Diagnosis not present

## 2018-02-25 LAB — DIGOXIN LEVEL: Digoxin Level: 0.6 ng/mL — ABNORMAL LOW (ref 0.8–2.0)

## 2018-02-27 ENCOUNTER — Telehealth: Payer: Self-pay | Admitting: Internal Medicine

## 2018-02-27 NOTE — Telephone Encounter (Signed)
Patient calling to discuss recent Digoxin results  Please call

## 2018-02-27 NOTE — Telephone Encounter (Signed)
8 days ago     Digoxin level checked by PCP on 12/25/17 by Dr. Silvio Pate- 1.8. Patient was instructed to cut digoxin in half at the time. According to his med list he is now taking digoxin 0.125 mg - 1/2 tablet (0.0625 mg) once daily      Dr. Caryl Comes, the patient had repeat Digoxin level done. See results. Do you want him to continue digoxin 0.125 mg- 1/2 tablet (0.0625 mg) once daily?

## 2018-02-28 NOTE — Telephone Encounter (Signed)
Reviewed the patient's digoxin level with Dr. Caryl Comes dated 02/25/18. Per Dr. Caryl Comes, the patient needs to continue with digoxin 0.125 mg- 1/2 tablet (0.0625 mg) once daily.  I was in the process of sending the refill in for the patient's medication and saw that this had been refilled on 02/22/18 by Dr. Rosanna Randy for digoxin 0.125 mg- take 1 tablet by mouth once daily.  I called and spoke with Mrs. Yim and advised that she have the patient take the 1/2 pill of digoxin (0.0625 mg total) once daily as he has been doing. She is aware that once the refill is received from [facility] that the RX directions will read to take 1 pill (0.125 mg) once daily. Mrs. Vivona is aware and agreeable with Dr. Olin Pia recommendations.

## 2018-03-12 ENCOUNTER — Ambulatory Visit: Payer: Medicare Other

## 2018-03-19 ENCOUNTER — Telehealth: Payer: Self-pay

## 2018-03-19 NOTE — Telephone Encounter (Signed)
LMTCB. Pt needs to r/s previously cancelled AWV from 03/12/18. -MM

## 2018-03-19 NOTE — Telephone Encounter (Signed)
Pt called back. I tried to schedule him an appt but he said he sis not wan to schedule a MWV.     Con Memos

## 2018-03-25 NOTE — Telephone Encounter (Signed)
Noted, thank you.  -MM 

## 2018-03-27 ENCOUNTER — Ambulatory Visit: Payer: Medicare Other

## 2018-03-27 ENCOUNTER — Ambulatory Visit (INDEPENDENT_AMBULATORY_CARE_PROVIDER_SITE_OTHER): Payer: Medicare Other

## 2018-03-27 DIAGNOSIS — Z23 Encounter for immunization: Secondary | ICD-10-CM

## 2018-03-27 DIAGNOSIS — I4821 Permanent atrial fibrillation: Secondary | ICD-10-CM

## 2018-03-27 DIAGNOSIS — I482 Chronic atrial fibrillation, unspecified: Secondary | ICD-10-CM

## 2018-03-27 LAB — POCT INR
INR: 2.4 (ref 2.0–3.0)
PT: 29.1

## 2018-03-27 NOTE — Patient Instructions (Signed)
Continue 2.5 mg daily and except 1.25 mg Mon, Wed, Fri F/U 4 weeks

## 2018-04-01 DIAGNOSIS — L570 Actinic keratosis: Secondary | ICD-10-CM | POA: Diagnosis not present

## 2018-04-01 DIAGNOSIS — C44319 Basal cell carcinoma of skin of other parts of face: Secondary | ICD-10-CM | POA: Diagnosis not present

## 2018-04-01 DIAGNOSIS — Z85828 Personal history of other malignant neoplasm of skin: Secondary | ICD-10-CM | POA: Diagnosis not present

## 2018-04-01 DIAGNOSIS — C4441 Basal cell carcinoma of skin of scalp and neck: Secondary | ICD-10-CM | POA: Diagnosis not present

## 2018-04-08 ENCOUNTER — Other Ambulatory Visit: Payer: Self-pay | Admitting: Internal Medicine

## 2018-04-08 DIAGNOSIS — C4441 Basal cell carcinoma of skin of scalp and neck: Secondary | ICD-10-CM | POA: Diagnosis not present

## 2018-04-21 NOTE — Progress Notes (Signed)
Patient ID: Joseph Barton, male    DOB: Nov 08, 1926, 84 y.o.   MRN: [id number]  HPI  Joseph Barton is an 84 yo male with a PMH of chronic a fib, chronic systolic heart failure, aortic aneurysm, CAD, HTN, previous tobacco use and hyperlipidemia.   ECHO 12/05/17 showed EF of 25-30% and moderate MVR and TVR. PA peak pressure moderately to severely elevated at 61 mmHg.   Admitted 12/04/17 with worsening dyspnea and chest pain and found to have an acute exacerbation of heart failure. While admitted, he was treated with IV Lasix and his digoxin was discontinued due to concerns of bradycardia. Cardiology was consulted during admission. He was discharged after 2 days.   He presents today for a follow-up visit with a chief complaint of minimal fatigue upon moderate exertion. He describes this as chronic in nature having been present for several years. He has associated shortness of breath and intermittent dizziness along with this. He denies any difficulty sleeping, abdominal distention, palpitations, pedal edema, chest pain, cough or weight gain.   Past Medical History:  Diagnosis Date  . Abdominal aortic aneurysm ([place]) 01-2004   4.7 x 4.7 cm.  Marland Kitchen CAD (coronary artery disease) 01/25/2004   a. 01/2004 Ant MI/DES to LAD;  b. 10/2011 Neg MV.  . CHF (congestive heart failure) ([place])   . Chronic systolic heart failure ([place])    a. 08/2016 Echo: EF 35%, nl RV fxn, mod to sev MR, mild to mod TR, mod biatrial enlargement.  . Hyperlipidemia   . Hypertension   . Ischemic cardiomyopathy    a. 08/2016 Echo: EF 35%.  . Moderate to Severe Mitral regurgitation    a. 08/2016 Echo: Mod-sev MR.  Marland Kitchen Permanent atrial fibrillation ([place])    a. Chronic coumadin (CHA2DS2VASc = 5).  . Pulmonary nodules    Noted on abdominal CT  . Statin intolerance    Past Surgical History:  Procedure Laterality Date  . ABDOMINAL AORTIC ANEURYSM REPAIR     12/02/2007 [facility]  . ABDOMINAL AORTIC ANEURYSM REPAIR  2006   [facility]   . CARDIAC  CATHETERIZATION  2009   [facility]  . CORONARY ANGIOPLASTY WITH STENT PLACEMENT  2005   Cypher stent LAD   . Cypher stents to LAD     01/25/2004  . SKIN SURGERY  2016   [facility] skin cancer   Family History  Problem Relation Age of Onset  . Heart attack Father 70       Died w/ Heart attack  . Diabetes Father   . Heart attack Brother   . Heart disease Brother   . Diabetes Sister   . Heart disease Sister    Social History   Tobacco Use  . Smoking status: Former Smoker    Last attempt to quit: 12/01/1987    Years since quitting: 30.4  . Smokeless tobacco: Never Used  Substance Use Topics  . Alcohol use: No   Allergies  Allergen Reactions  . Atorvastatin   . Simvastatin    Prior to Admission medications   Medication Sig Start Date End Date Taking? Authorizing Provider  albuterol (PROVENTIL HFA;VENTOLIN HFA) 108 (90 Base) MCG/ACT inhaler Inhale 2 puffs into the lungs every 6 (six) hours as needed for wheezing or shortness of breath. 11/29/17  Yes Jerrol Banana., MD  Bioflavonoid Products (BIOFLEX PO) Take 1 tablet by mouth daily.    Yes [provider]  digoxin (LANOXIN) 0.125 MG tablet Take 0.0625 mg by mouth  daily.    Yes [provider]  digoxin (LANOXIN) 0.125 MG tablet TAKE 1 TABLET BY MOUTH  DAILY 02/22/18  Yes Jerrol Banana., MD  furosemide (LASIX) 20 MG tablet Take 40 mg by mouth daily.   Yes [provider]  lisinopril (PRINIVIL,ZESTRIL) 2.5 MG tablet Take 1 tablet (2.5 mg total) by mouth daily. 02/18/18 04/22/18 Yes Deboraha Sprang, MD  metoprolol succinate (TOPROL-XL) 25 MG 24 hr tablet TAKE ONE-HALF TABLET BY  MOUTH DAILY 04/08/18  Yes Deboraha Sprang, MD  omeprazole (PRILOSEC) 20 MG capsule TAKE 1 CAPSULE BY MOUTH  DAILY 02/16/18  Yes Jerrol Banana., MD  potassium chloride 20 MEQ TBCR Take 20 mEq by mouth daily. 12/06/17 04/22/18 Yes Sainani, Belia Heman, MD  warfarin (COUMADIN) 2.5 MG tablet Take 1 tablet (2.5 mg total) by mouth as  directed. Patient taking differently: Take  tablet (1.25MG ) by mouth Monday, Wednesday, Friday and take 1 tablet (2.5MG ) by mouth Tuesday, Thursday, Saturday and Sunday 10/03/17  Yes Jerrol Banana., MD    Review of Systems  Constitutional: Positive for fatigue (minimal). Negative for appetite change.  HENT: Negative for congestion, rhinorrhea and sore throat.   Eyes: Negative.   Respiratory: Positive for shortness of breath. Negative for cough and chest tightness.   Cardiovascular: Negative for chest pain, palpitations and leg swelling.  Gastrointestinal: Negative for abdominal distention and abdominal pain.  Endocrine: Negative.   Genitourinary: Negative.   Musculoskeletal: Negative for back pain and neck pain.  Skin: Negative.   Allergic/Immunologic: Negative.   Neurological: Positive for dizziness. Negative for light-headedness.  Hematological: Negative for adenopathy. Does not bruise/bleed easily.  Psychiatric/Behavioral: Negative for dysphoric mood and sleep disturbance. The patient is not nervous/anxious.     Vitals:   04/22/18 0913  BP: 115/81  Pulse: 70  Resp: 18  SpO2: 97%  Weight: 194 lb (88 kg)  Height: 6\' 1"  (1.854 m)   Wt Readings from Last 3 Encounters:  04/22/18 194 lb (88 kg)  02/19/18 192 lb (87.1 kg)  01/16/18 193 lb 6 oz (87.7 kg)   Lab Results  Component Value Date   CREATININE 1.28 (H) 12/25/2017   CREATININE 1.34 (H) 12/11/2017   CREATININE 1.17 12/06/2017    Physical Exam  Constitutional: He is oriented to person, place, and time. He appears well-developed and well-nourished.  HENT:  Head: Normocephalic and atraumatic.  Neck: Normal range of motion. Neck supple. No JVD present.  Cardiovascular: Normal rate. An irregular rhythm present.  Pulmonary/Chest: Effort normal. No respiratory distress. He has no wheezes. He has no rales.  Abdominal: Soft. He exhibits no distension.  Musculoskeletal:       Right lower leg: He exhibits no  tenderness and no edema.       Left lower leg: He exhibits no tenderness and no edema.  Neurological: He is alert and oriented to person, place, and time.  Skin: Skin is warm and dry.  Psychiatric: He has a normal mood and affect. His behavior is normal.  Nursing note and vitals reviewed.    Assessment & Plan:  1. Chronic Heart Failure with reduced ejection fraction- - NYHA class II - euvolemic today - weighing daily and reminded him too call clinic if overnight weight gain of 2-3 lbs or 5 lbs in a week - weight unchanged from last visit here 3 months ago - not adding salt and he says that they've been trying to read food labels - last saw cardiologist (  Dr. Caryl Comes) on 11/15/17 - last BMP 12/25/17 showed Na 140, K 4.4, GFR 49 - BP will not allow a change to entresto  2. Hypotension- - BP looks good although on the low end  - patient states that he feels dizzy when he bends over and comes back up too quickly; encouraged to change positions slowly - saw PCP Rosanna Randy) 02/19/18  Patient did not bring his medications nor a list. Each medication was verbally reviewed with the patient and he was encouraged to bring the bottles to every visit to confirm accuracy of list.  Will not make a return appointment at this time. Advised patient and his wife that they could call back at anytime to make another appointment.

## 2018-04-22 ENCOUNTER — Ambulatory Visit: Payer: Medicare Other | Attending: Family | Admitting: Family

## 2018-04-22 ENCOUNTER — Encounter: Payer: Self-pay | Admitting: Family

## 2018-04-22 VITALS — BP 115/81 | HR 70 | Resp 18 | Ht 73.0 in | Wt 194.0 lb

## 2018-04-22 DIAGNOSIS — I110 Hypertensive heart disease with heart failure: Secondary | ICD-10-CM | POA: Insufficient documentation

## 2018-04-22 DIAGNOSIS — E785 Hyperlipidemia, unspecified: Secondary | ICD-10-CM | POA: Insufficient documentation

## 2018-04-22 DIAGNOSIS — Z955 Presence of coronary angioplasty implant and graft: Secondary | ICD-10-CM | POA: Diagnosis not present

## 2018-04-22 DIAGNOSIS — I4821 Permanent atrial fibrillation: Secondary | ICD-10-CM | POA: Diagnosis not present

## 2018-04-22 DIAGNOSIS — Z79899 Other long term (current) drug therapy: Secondary | ICD-10-CM | POA: Diagnosis not present

## 2018-04-22 DIAGNOSIS — I950 Idiopathic hypotension: Secondary | ICD-10-CM

## 2018-04-22 DIAGNOSIS — Z87891 Personal history of nicotine dependence: Secondary | ICD-10-CM | POA: Diagnosis not present

## 2018-04-22 DIAGNOSIS — I255 Ischemic cardiomyopathy: Secondary | ICD-10-CM | POA: Diagnosis not present

## 2018-04-22 DIAGNOSIS — I5022 Chronic systolic (congestive) heart failure: Secondary | ICD-10-CM | POA: Diagnosis not present

## 2018-04-22 DIAGNOSIS — I714 Abdominal aortic aneurysm, without rupture: Secondary | ICD-10-CM | POA: Insufficient documentation

## 2018-04-22 DIAGNOSIS — I2510 Atherosclerotic heart disease of native coronary artery without angina pectoris: Secondary | ICD-10-CM | POA: Insufficient documentation

## 2018-04-22 DIAGNOSIS — I959 Hypotension, unspecified: Secondary | ICD-10-CM | POA: Insufficient documentation

## 2018-04-22 DIAGNOSIS — Z7901 Long term (current) use of anticoagulants: Secondary | ICD-10-CM | POA: Insufficient documentation

## 2018-04-22 NOTE — Patient Instructions (Signed)
Continue weighing daily and call for an overnight weight gain of > 2 pounds or a weekly weight gain of >5 pounds. 

## 2018-04-24 ENCOUNTER — Ambulatory Visit (INDEPENDENT_AMBULATORY_CARE_PROVIDER_SITE_OTHER): Payer: Medicare Other

## 2018-04-24 ENCOUNTER — Ambulatory Visit: Payer: Medicare Other

## 2018-04-24 DIAGNOSIS — I482 Chronic atrial fibrillation, unspecified: Secondary | ICD-10-CM | POA: Diagnosis not present

## 2018-04-24 LAB — POCT INR
INR: 3.3 — AB (ref 2.0–3.0)
PT: 39.7

## 2018-04-24 NOTE — Patient Instructions (Signed)
Description   2.5mg  daily except 1.25mg  Monday and Friday. F/U 2 weeks

## 2018-05-09 ENCOUNTER — Ambulatory Visit: Payer: Self-pay

## 2018-05-14 ENCOUNTER — Ambulatory Visit (INDEPENDENT_AMBULATORY_CARE_PROVIDER_SITE_OTHER): Payer: Medicare Other

## 2018-05-14 DIAGNOSIS — I482 Chronic atrial fibrillation, unspecified: Secondary | ICD-10-CM | POA: Diagnosis not present

## 2018-05-14 LAB — POCT INR
INR: 2.4 (ref 2.0–3.0)
PT: 28.4

## 2018-05-14 NOTE — Patient Instructions (Signed)
Description   2.5mg  daily except 1.25mg  Monday and Friday. F/U 4 weeks

## 2018-05-21 ENCOUNTER — Telehealth: Payer: Self-pay | Admitting: Internal Medicine

## 2018-05-21 NOTE — Telephone Encounter (Signed)
Made in error

## 2018-05-23 ENCOUNTER — Ambulatory Visit (INDEPENDENT_AMBULATORY_CARE_PROVIDER_SITE_OTHER): Payer: Medicare Other | Admitting: Family Medicine

## 2018-05-23 VITALS — BP 118/70 | HR 60 | Resp 16 | Wt 195.0 lb

## 2018-05-23 DIAGNOSIS — I2510 Atherosclerotic heart disease of native coronary artery without angina pectoris: Secondary | ICD-10-CM | POA: Diagnosis not present

## 2018-05-23 DIAGNOSIS — E039 Hypothyroidism, unspecified: Secondary | ICD-10-CM

## 2018-05-23 DIAGNOSIS — Z789 Other specified health status: Secondary | ICD-10-CM | POA: Diagnosis not present

## 2018-05-23 DIAGNOSIS — I5022 Chronic systolic (congestive) heart failure: Secondary | ICD-10-CM | POA: Diagnosis not present

## 2018-05-23 DIAGNOSIS — I4821 Permanent atrial fibrillation: Secondary | ICD-10-CM | POA: Diagnosis not present

## 2018-05-23 DIAGNOSIS — R739 Hyperglycemia, unspecified: Secondary | ICD-10-CM

## 2018-05-23 DIAGNOSIS — I255 Ischemic cardiomyopathy: Secondary | ICD-10-CM

## 2018-05-23 DIAGNOSIS — Z9861 Coronary angioplasty status: Secondary | ICD-10-CM

## 2018-05-23 NOTE — Progress Notes (Signed)
Joseph Barton  MRN: [id number] DOB: May 10, 1927  Subjective:  HPI   The patient is an 84 year old male who presents for 3 month follow up.  He was last seen on 02/19/18.  No changes in management at that time.   Chronic Atrial Fibrillation.  The patient has been being followed with his INR.  His last reading was on 05/14/18 and was in his target range at 2.4. He had reported on his last visit that he was feeling better with the lowering of his Digoxin dose.    Hypertension-blood pressure readings have been stable.  BP Readings from Last 3 Encounters:  05/23/18 118/70  04/22/18 115/81  02/19/18 108/62   Chronic congestive heart failure-On his last visit this was noted to be improved.  The patient is followed by the CHF clinic.  Patient Active Problem List   Diagnosis Date Noted  . Hypotension 12/12/2017  . CHF (congestive heart failure) ([place]) 02-26-202019  . Subclinical hypothyroidism 10/06/2015  . Allergic rhinitis 09/10/2015  . Bradycardia 09/10/2015  . Failure of erection 09/10/2015  . Blood glucose elevated 09/10/2015  . BP (high blood pressure) 09/10/2015  . Neuropathy 09/10/2015  . Apnea, sleep 09/10/2015  . Cancer of skin, squamous cell 09/10/2015  . Acid reflux 04/28/2015  . Arthritis, degenerative 12/25/2014  . Cardiomyopathy, ischemic 09/27/2013  . HLD (hyperlipidemia) 09/27/2013  . Lung nodule, multiple 09/27/2013  . Drug intolerance 09/27/2013  . Ischemic cardiomyopathy 12/01/2011  . Atrial fibrillation ([place]) 12/01/2011  . Chronic systolic heart failure ([place]) 12/01/2011  . IVCD (intraventricular conduction defect) 12/01/2011  . Intraventricular block 12/01/2011  . 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 CAD S/P percutaneous coronary angioplasty 01/25/2004  . Myocardial infarction ([place]) 01/25/2004  . AAA (abdominal aortic aneurysm) ([place]) 01/01/2004    Past Medical History:  Diagnosis Date  . Abdominal aortic aneurysm ([place]) 01-2004   4.7 x  4.7 cm.  Marland Kitchen CAD (coronary artery disease) 01/25/2004   a. 01/2004 Ant MI/DES to LAD;  b. 10/2011 Neg MV.  . CHF (congestive heart failure) ([place])   . Chronic systolic heart failure ([place])    a. 08/2016 Echo: EF 35%, nl RV fxn, mod to sev MR, mild to mod TR, mod biatrial enlargement.  . Hyperlipidemia   . Hypertension   . Ischemic cardiomyopathy    a. 08/2016 Echo: EF 35%.  . Moderate to Severe Mitral regurgitation    a. 08/2016 Echo: Mod-sev MR.  Marland Kitchen Permanent atrial fibrillation    a. Chronic coumadin (CHA2DS2VASc = 5).  . Pulmonary nodules    Noted on abdominal CT  . Statin intolerance     Social History   Socioeconomic History  . Marital status: Married    Spouse name: Not on file  . Number of children: 1  . Years of education: Not on file  . Highest education level: Not on file  Occupational History  . Not on file  Social Needs  . Financial resource strain: Not on file  . Food insecurity:    Worry: Not on file    Inability: Not on file  . Transportation needs:    Medical: Not on file    Non-medical: Not on file  Tobacco Use  . Smoking status: Former Smoker    Last attempt to quit: 12/01/1987    Years since quitting: 30.4  . Smokeless tobacco: Never Used  Substance and Sexual Activity  . Alcohol use: No  . Drug use: No  . Sexual  activity: Not on file  Lifestyle  . Physical activity:    Days per week: Not on file    Minutes per session: Not on file  . Stress: Not on file  Relationships  . Social connections:    Talks on phone: Not on file    Gets together: Not on file    Attends religious service: Not on file    Active member of club or organization: Not on file    Attends meetings of clubs or organizations: Not on file    Relationship status: Not on file  . Intimate partner violence:    Fear of current or ex partner: Not on file    Emotionally abused: Not on file    Physically abused: Not on file    Forced sexual activity: Not on file  Other Topics Concern  .  Not on file  Social History Narrative  . Not on file    Outpatient Encounter Medications as of 05/23/2018  Medication Sig  . albuterol (PROVENTIL HFA;VENTOLIN HFA) 108 (90 Base) MCG/ACT inhaler Inhale 2 puffs into the lungs every 6 (six) hours as needed for wheezing or shortness of breath.  Marland Kitchen Bioflavonoid Products (BIOFLEX PO) Take 1 tablet by mouth daily.   . digoxin (LANOXIN) 0.125 MG tablet Take 0.0625 mg by mouth daily.   . furosemide (LASIX) 20 MG tablet Take 40 mg by mouth daily.  . metoprolol succinate (TOPROL-XL) 25 MG 24 hr tablet TAKE ONE-HALF TABLET BY  MOUTH DAILY  . omeprazole (PRILOSEC) 20 MG capsule TAKE 1 CAPSULE BY MOUTH  DAILY  . warfarin (COUMADIN) 2.5 MG tablet Take 1 tablet (2.5 mg total) by mouth as directed. (Patient taking differently: Take  tablet (1.25MG ) by mouth Monday, Wednesday, Friday and take 1 tablet (2.5MG ) by mouth Tuesday, Thursday, Saturday and Sunday)  . lisinopril (PRINIVIL,ZESTRIL) 2.5 MG tablet Take 1 tablet (2.5 mg total) by mouth daily.  . potassium chloride 20 MEQ TBCR Take 20 mEq by mouth daily.  . [DISCONTINUED] digoxin (LANOXIN) 0.125 MG tablet TAKE 1 TABLET BY MOUTH  DAILY   No facility-administered encounter medications on file as of 05/23/2018.     Allergies  Allergen Reactions  . Atorvastatin   . Simvastatin     Review of Systems  Constitutional: Negative.   HENT: Negative.   Eyes: Negative.   Respiratory: Positive for shortness of breath (chronic). Negative for cough and wheezing.   Cardiovascular: Negative for chest pain, palpitations, orthopnea, claudication and leg swelling.  Gastrointestinal: Negative.   Musculoskeletal: Positive for joint pain.  Skin: Negative.   Neurological: Negative.   Endo/Heme/Allergies: Negative.   Psychiatric/Behavioral: Negative.     Objective:  BP 118/70 (BP Location: Right Arm, Patient Position: Sitting, Cuff Size: Normal)   Pulse 60   Resp 16   Wt 195 lb (88.5 kg)   SpO2 98%   BMI 25.73  kg/m   Physical Exam  Constitutional: He is oriented to person, place, and time and well-developed, well-nourished, and in no distress.  HENT:  Head: Normocephalic and atraumatic.  Eyes: Conjunctivae are normal.  Neck: No thyromegaly present.  Cardiovascular: Normal rate, regular rhythm and normal heart sounds.  Pulmonary/Chest: Effort normal and breath sounds normal.  Abdominal: Soft.  Musculoskeletal: He exhibits no edema.  Neurological: He is alert and oriented to person, place, and time. Gait normal. GCS score is 15.  Skin: Skin is warm and dry.  Psychiatric: Mood, memory, affect and judgment normal.    Assessment and Plan :  1. Ischemic cardiomyopathy Clinically stable  2. Chronic systolic heart failure (HCC) Stable.  Return to clinic 4 to 6 months or as needed. Also followed at CHF clinic 3. CAD S/P percutaneous coronary angioplasty   4. Permanent atrial fibrillation Check Dig level  5. Drug intolerance Tolerant to statins.  6. Blood glucose elevated  7.Hypothyroid Check TSH.   HPI, Exam and A&P Transcribed under the direction and in the presence of Wilhemena Durie., MD. Electronically Signed: Althea Charon, RMA I have done the exam and reviewed the chart and it is accurate to the best of my knowledge. Development worker, community has been used and  any errors in dictation or transcription are unintentional. Miguel Aschoff M.D. [facility]

## 2018-06-11 ENCOUNTER — Ambulatory Visit (INDEPENDENT_AMBULATORY_CARE_PROVIDER_SITE_OTHER): Payer: Medicare Other

## 2018-06-11 DIAGNOSIS — I482 Chronic atrial fibrillation, unspecified: Secondary | ICD-10-CM

## 2018-06-11 DIAGNOSIS — I4821 Permanent atrial fibrillation: Secondary | ICD-10-CM | POA: Diagnosis not present

## 2018-06-11 LAB — POCT INR
INR: 2.6 (ref 2.0–3.0)
PT: 31.1

## 2018-06-11 NOTE — Patient Instructions (Signed)
Description   2.5mg  daily except 1.25mg  Monday and Friday. F/U 4 weeks

## 2018-06-20 ENCOUNTER — Ambulatory Visit: Payer: Medicare Other | Admitting: Internal Medicine

## 2018-06-20 ENCOUNTER — Encounter: Payer: Self-pay | Admitting: Internal Medicine

## 2018-06-20 VITALS — BP 127/80 | HR 86 | Ht 73.5 in | Wt 194.2 lb

## 2018-06-20 DIAGNOSIS — I5022 Chronic systolic (congestive) heart failure: Secondary | ICD-10-CM | POA: Diagnosis not present

## 2018-06-20 DIAGNOSIS — I255 Ischemic cardiomyopathy: Secondary | ICD-10-CM | POA: Diagnosis not present

## 2018-06-20 DIAGNOSIS — I4821 Permanent atrial fibrillation: Secondary | ICD-10-CM | POA: Diagnosis not present

## 2018-06-20 MED ORDER — SACUBITRIL-VALSARTAN 24-26 MG PO TABS: 1.0000 | ORAL_TABLET | Freq: Two times a day (BID) | ORAL | 6 refills | Status: DC

## 2018-06-20 NOTE — Patient Instructions (Addendum)
Medication Instructions:  - Your physician has recommended you make the following change in your medication:   1) INCREASE lasix (furosemide) 20 mg- take 2 tablets (40 mg) by mouth TWICE daily x 3 days (today, tomorrow, and Saturday)  2) Take your last dose of lisinopril on Saturday, then stop  3) On Monday morning, START entresto 24/26 mg- take 1 tablet by mouth TWICE daily  Samples given: Entresto 24/26 mg Lot: [id number] Exp: 6/21 # 1 box  If you need a refill on your cardiac medications before your next appointment, please call your pharmacy.   Lab work: - Your physician recommends that you return for lab work in: 2 weeks- BMP/ CBC/ Digoxin level  If you have labs (blood work) drawn today and your tests are completely normal, you will receive your results only by: Marland Kitchen MyChart Message (if you have MyChart) OR . A paper copy in the mail If you have any lab test that is abnormal or we need to change your treatment, we will call you to review the results.  Testing/Procedures: - none ordered  Follow-Up: At [facility], you and your health needs are our priority.  As part of our continuing mission to provide you with exceptional heart care, we have created designated Provider Care Teams.  These Care Teams include your primary Cardiologist (physician) and Advanced Practice Providers (APPs -  Physician Assistants and Nurse Practitioners) who all work together to provide you with the care you need, when you need it. . You will need a follow up appointment in 6 months with Dr. Caryl Comes.  Please call our office 2 months in advance to schedule this appointment.   Any Other Special Instructions Will Be Listed Below (If Applicable). - N/A

## 2018-06-20 NOTE — Progress Notes (Signed)
Patient Care Team: Jerrol Banana., MD as PCP - General (Family Medicine) Lorelee Cover., MD as Consulting Physician (Ophthalmology) Deboraha Sprang, MD as Consulting Physician (Cardiology)   HPI  Joseph Barton is a 84 y.o. male Seen in follow-up for permanent atrial fibrillation congestive heart failure in the setting of mitral regurgitation moderate-severe as well as known ischemic heart disease with a negative Myoview 2013 and remote stenting.      DATE TEST EF   4/13 Myoview  13 %   08/2016 Echo   35 % MR mod-sev//LAE mod            Date Cr K DIG Hgb  3/18 1.4  1.3 (12/17) 15.2  6/18 1.03 4.6    10/18   1.1   5/19 1.24 4.7  13.8  8/19 1.28 4.4 0.6    He continues to struggle with dyspnea on exertion and even dyspnea at rest.  Denies nocturnal dyspnea orthopnea.  Scant peripheral edema.  No exertional chest discomfort.    Past Medical History:  Diagnosis Date  . Abdominal aortic aneurysm ([place]) 01-2004   4.7 x 4.7 cm.  Marland Kitchen CAD (coronary artery disease) 01/25/2004   a. 01/2004 Ant MI/DES to LAD;  b. 10/2011 Neg MV.  . CHF (congestive heart failure) ([place])   . Chronic systolic heart failure ([place])    a. 08/2016 Echo: EF 35%, nl RV fxn, mod to sev MR, mild to mod TR, mod biatrial enlargement.  . Hyperlipidemia   . Hypertension   . Ischemic cardiomyopathy    a. 08/2016 Echo: EF 35%.  . Moderate to Severe Mitral regurgitation    a. 08/2016 Echo: Mod-sev MR.  Marland Kitchen Permanent atrial fibrillation    a. Chronic coumadin (CHA2DS2VASc = 5).  . Pulmonary nodules    Noted on abdominal CT  . Statin intolerance     Past Surgical History:  Procedure Laterality Date  . ABDOMINAL AORTIC ANEURYSM REPAIR     12/02/2007 [facility]  . ABDOMINAL AORTIC ANEURYSM REPAIR  2006   [facility]   . CARDIAC CATHETERIZATION  2009   [facility]  . CORONARY ANGIOPLASTY WITH STENT PLACEMENT  2005   Cypher stent LAD   . Cypher stents to LAD     01/25/2004  . SKIN SURGERY  2016   [facility] skin  cancer    Current Meds  Medication Sig  . albuterol (PROVENTIL HFA;VENTOLIN HFA) 108 (90 Base) MCG/ACT inhaler Inhale 2 puffs into the lungs every 6 (six) hours as needed for wheezing or shortness of breath.  Marland Kitchen Bioflavonoid Products (BIOFLEX PO) Take 1 tablet by mouth daily.   . digoxin (LANOXIN) 0.125 MG tablet Take 0.0625 mg by mouth daily.   . furosemide (LASIX) 20 MG tablet Take 40 mg by mouth daily.  . metoprolol succinate (TOPROL-XL) 25 MG 24 hr tablet TAKE ONE-HALF TABLET BY  MOUTH DAILY  . omeprazole (PRILOSEC) 20 MG capsule TAKE 1 CAPSULE BY MOUTH  DAILY  . warfarin (COUMADIN) 2.5 MG tablet Take 1 tablet (2.5 mg total) by mouth as directed. (Patient taking differently: Take  tablet (1.25MG ) by mouth Monday, Wednesday, Friday and take 1 tablet (2.5MG ) by mouth Tuesday, Thursday, Saturday and Sunday)    Allergies  Allergen Reactions  . Atorvastatin   . Simvastatin       Review of Systems negative except from HPI and PMH  Physical Exam BP 127/80 (BP Location: Left Arm, Patient Position: Sitting, Cuff Size: Normal)  Pulse 86   Ht 6' 1.5" (1.867 m)   Wt 194 lb 4 oz (88.1 kg)   BMI 25.28 kg/m  Well developed and nourished in no acute distress HENT normal Neck supple with JVP-8 Clear IRR rate and rhythm, 3/6 m Abd-soft with active BS No Clubbing cyanosis edema Skin-warm and dry A & Oriented  Grossly normal sensory and motor function   ECG AFib 86 -/15/38 IVCD  Assessment and  Plan  Atrial fibrillation-permanent  Mitral regurgitation-moderate-severe  Chronic systolic heart failure  Ischemic heart disease with prior stenting  Congestive heart failure-acute on chronic  Sensory neuropathy   Mildly volume overloaded.  We will increase his diuretics for 3 days.  We will transition from lisinopril to [facility].  We have reviewed this issues of dizziness.  We will check a metabolic profile 2 weeks after that.  We will check a digoxin level at the same  time.  I reached out to Dr. Edd Fabian to discuss whether he might be a candidate for MitraClip.  We will send him a copy of the echo.  He may need a TEE for elucidation as there are some echo reports that suggest a centricity  It may also be appropriate to consider catheterization as an initial step.  I will await Dr. Tiffany Kocher input on the question as well  More than 50% of 40 min was spent in counseling related to the above  Current medicines are reviewed at length with the patient today .  The patient does not  have concerns regarding medicines.

## 2018-06-24 ENCOUNTER — Telehealth: Payer: Self-pay

## 2018-06-24 NOTE — Telephone Encounter (Signed)
Called to arrange MitraClip consult with Dr. Burt Knack. The patient declines appointment at this time, stating he does not to want to proceed with evaluation or treatment. He is aware the appointment is simply to speak with the doctor, not for the procedure.  Instructed the patient to call if he changes his mind and wishes to schedule consult. He was grateful for call.

## 2018-06-28 ENCOUNTER — Encounter: Payer: Self-pay | Admitting: Family Medicine

## 2018-06-28 ENCOUNTER — Ambulatory Visit (INDEPENDENT_AMBULATORY_CARE_PROVIDER_SITE_OTHER): Payer: Medicare Other | Admitting: Family Medicine

## 2018-06-28 VITALS — BP 80/52 | HR 54 | Temp 97.4°F | Resp 18 | Wt 191.0 lb

## 2018-06-28 DIAGNOSIS — R05 Cough: Secondary | ICD-10-CM | POA: Diagnosis not present

## 2018-06-28 DIAGNOSIS — R059 Cough, unspecified: Secondary | ICD-10-CM

## 2018-06-28 DIAGNOSIS — J069 Acute upper respiratory infection, unspecified: Secondary | ICD-10-CM

## 2018-06-28 MED ORDER — AMOXICILLIN 500 MG PO CAPS: 1000.0000 mg | ORAL_CAPSULE | Freq: Two times a day (BID) | ORAL | 0 refills | Status: AC

## 2018-06-28 NOTE — Progress Notes (Signed)
Patient: Joseph Barton Male    DOB: January 10, 1927   84 y.o.   MRN: [id number] Visit Date: 06/28/2018  Today's Provider: Lelon Huh, MD   Chief Complaint  Patient presents with  . Cough    x 6 days   Subjective:     Cough  This is a new problem. Episode onset: 6 days ago. The problem has been gradually worsening. The cough is productive of sputum (yellow colored with blood tinged). Associated symptoms include chills, headaches, hemoptysis, nasal congestion, rhinorrhea and shortness of breath. Pertinent negatives include no chest pain, ear congestion, ear pain, fever, myalgias, postnasal drip, sore throat, sweats or wheezing. Treatments tried: cough drops and OTC cough medication. The treatment provided mild relief.  He was prescribed Entresto last week and had lisinopril stopped at that time. He states he took it for 4 days, with last dose being last night, but didn't take it or the lisinopril today because he has been feeling so weak.   Allergies  Allergen Reactions  . Atorvastatin   . Simvastatin      Current Outpatient Medications:  .  albuterol (PROVENTIL HFA;VENTOLIN HFA) 108 (90 Base) MCG/ACT inhaler, Inhale 2 puffs into the lungs every 6 (six) hours as needed for wheezing or shortness of breath., Disp: 1 Inhaler, Rfl: 0 .  Bioflavonoid Products (BIOFLEX PO), Take 1 tablet by mouth daily. , Disp: , Rfl:  .  digoxin (LANOXIN) 0.125 MG tablet, Take 0.0625 mg by mouth daily. , Disp: , Rfl:  .  furosemide (LASIX) 20 MG tablet, Take 40 mg by mouth daily., Disp: , Rfl:  .  metoprolol succinate (TOPROL-XL) 25 MG 24 hr tablet, TAKE ONE-HALF TABLET BY  MOUTH DAILY, Disp: 45 tablet, Rfl: 0 .  omeprazole (PRILOSEC) 20 MG capsule, TAKE 1 CAPSULE BY MOUTH  DAILY, Disp: 90 capsule, Rfl: 3 .  warfarin (COUMADIN) 2.5 MG tablet, Take 1 tablet (2.5 mg total) by mouth as directed. (Patient taking differently: Take  tablet (1.25MG ) by mouth Monday, Wednesday, Friday and take 1 tablet (2.5MG )  by mouth Tuesday, Thursday, Saturday and Sunday), Disp: 30 tablet, Rfl: 12 .  lisinopril (PRINIVIL,ZESTRIL) 2.5 MG tablet, Take 2.5 mg by mouth daily., (Patient not taking: Reported on 06/28/2018) Disp: , Rfl:  .  potassium chloride 20 MEQ TBCR, Take 20 mEq by mouth daily., Disp: 60 tablet, Rfl: 1 .  sacubitril-valsartan (ENTRESTO) 24-26 MG, Take 1 tablet by mouth 2 (two) times daily. (Patient not taking: Reported on 06/28/2018), Disp: 60 tablet, Rfl: 6  Review of Systems  Constitutional: Positive for chills. Negative for appetite change and fever.  HENT: Positive for congestion and rhinorrhea. Negative for ear pain, postnasal drip and sore throat.   Respiratory: Positive for cough, hemoptysis and shortness of breath. Negative for chest tightness and wheezing.   Cardiovascular: Negative for chest pain and palpitations.  Gastrointestinal: Negative for abdominal pain, nausea and vomiting.  Musculoskeletal: Negative for myalgias.  Neurological: Positive for headaches.    Social History   Tobacco Use  . Smoking status: Former Smoker    Last attempt to quit: 12/01/1987    Years since quitting: 30.5  . Smokeless tobacco: Never Used  Substance Use Topics  . Alcohol use: No      Objective:   BP (!) 80/52 (BP Location: Left Arm, Cuff Size: Normal)   Pulse (!) 54   Temp (!) 97.4 F (36.3 C) (Oral)   Resp 18   Wt 191 lb (86.6 kg)  SpO2 95% Comment: room air  BMI 24.86 kg/m  Vitals:   06/28/18 1534 06/28/18 1545  BP: (!) 83/50 (!) 80/52  Pulse: (!) 54   Resp: 18   Temp: (!) 97.4 F (36.3 C)   TempSrc: Oral   SpO2: 95%   Weight: 191 lb (86.6 kg)      Physical Exam  General Appearance:    Alert, cooperative, no distress  HENT:   neck without nodes, throat normal without erythema or exudate, frontal sinus tender and nasal mucosa congested  Eyes:    PERRL, conjunctiva/corneas clear, EOM's intact       Lungs:     Clear to auscultation bilaterally, respirations unlabored  Heart:     Regular rate and rhythm  Neurologic:   Awake, alert, oriented x 3. No apparent focal neurological           defect.          Assessment & Plan    1. Upper respiratory tract infection, unspecified type   2. Cough  Lungs clear, but has some post nasal sinus drainage. start- amoxicillin (AMOXIL) 500 MG capsule; Take 2 capsules (1,000 mg total) by mouth 2 (two) times daily for 7 days.  Dispense: 28 capsule; Refill: 0 Call if symptoms change or if not rapidly improving.       Lelon Huh, MD  [facility]

## 2018-06-28 NOTE — Patient Instructions (Signed)
.   Please bring all of your medications to every appointment so we can make sure that our medication list is the same as yours.   

## 2018-07-02 ENCOUNTER — Telehealth: Payer: Self-pay

## 2018-07-02 NOTE — Telephone Encounter (Signed)
Would recommend Delsym or Mucinex DM over the counter.

## 2018-07-02 NOTE — Telephone Encounter (Signed)
Please review

## 2018-07-02 NOTE — Telephone Encounter (Signed)
Advised pt's wife of OTC medications for cough.  dbs

## 2018-07-02 NOTE — Telephone Encounter (Signed)
Patient's wife called requesting a cough medication for Joseph Barton. Patient was seen last week and is currently taking antibiotic. Patient would like something called into [facility] on [address]. Please advise. [phone]# [phone]

## 2018-07-04 ENCOUNTER — Other Ambulatory Visit: Payer: Medicare Other

## 2018-07-05 ENCOUNTER — Other Ambulatory Visit (INDEPENDENT_AMBULATORY_CARE_PROVIDER_SITE_OTHER): Payer: Medicare Other

## 2018-07-05 DIAGNOSIS — I4821 Permanent atrial fibrillation: Secondary | ICD-10-CM | POA: Diagnosis not present

## 2018-07-05 DIAGNOSIS — I5022 Chronic systolic (congestive) heart failure: Secondary | ICD-10-CM

## 2018-07-06 LAB — BASIC METABOLIC PANEL
BUN/Creatinine Ratio: 16 (ref 10–24)
BUN: 21 mg/dL (ref 8–27)
CO2: 21 mmol/L (ref 20–29)
Calcium: 9 mg/dL (ref 8.6–10.2)
Chloride: 102 mmol/L (ref 96–106)
Creatinine, Ser: 1.3 mg/dL — ABNORMAL HIGH (ref 0.76–1.27)
GFR calc Af Amer: 56 mL/min/{1.73_m2} — ABNORMAL LOW (ref >59)
GFR calc non Af Amer: 48 mL/min/{1.73_m2} — ABNORMAL LOW (ref >59)
GLUCOSE: 113 mg/dL — AB (ref 65–99)
Potassium: 4.6 mmol/L (ref 3.5–5.2)
Sodium: 141 mmol/L (ref 134–144)

## 2018-07-06 LAB — CBC WITH DIFFERENTIAL/PLATELET
Basophils Absolute: 0 10*3/uL (ref 0.0–0.2)
Basos: 1 %
EOS (ABSOLUTE): 0.1 10*3/uL (ref 0.0–0.4)
Eos: 1 %
Hematocrit: 44 % (ref 37.5–51.0)
Hemoglobin: 14.8 g/dL (ref 13.0–17.7)
Immature Grans (Abs): 0 10*3/uL (ref 0.0–0.1)
Immature Granulocytes: 1 %
Lymphocytes Absolute: 1.3 10*3/uL (ref 0.7–3.1)
Lymphs: 19 %
MCH: 30.8 pg (ref 26.6–33.0)
MCHC: 33.6 g/dL (ref 31.5–35.7)
MCV: 92 fL (ref 79–97)
Monocytes Absolute: 0.6 10*3/uL (ref 0.1–0.9)
Monocytes: 9 %
NEUTROS PCT: 69 %
Neutrophils Absolute: 4.6 10*3/uL (ref 1.4–7.0)
Platelets: 171 10*3/uL (ref 150–450)
RBC: 4.81 x10E6/uL (ref 4.14–5.80)
RDW: 13 % (ref 12.3–15.4)
WBC: 6.5 10*3/uL (ref 3.4–10.8)

## 2018-07-06 LAB — DIGOXIN LEVEL: Digoxin, Serum: 0.7 ng/mL (ref 0.5–0.9)

## 2018-07-09 ENCOUNTER — Ambulatory Visit (INDEPENDENT_AMBULATORY_CARE_PROVIDER_SITE_OTHER): Payer: Medicare Other

## 2018-07-09 DIAGNOSIS — I482 Chronic atrial fibrillation, unspecified: Secondary | ICD-10-CM

## 2018-07-09 LAB — POCT INR
INR: 3.4 — AB (ref 2.0–3.0)
PT: 40.9

## 2018-07-09 NOTE — Patient Instructions (Signed)
Description   2.5mg  daily except 1.25mg  Monday, Wednesday  and Friday. F/U 2 weeks

## 2018-07-11 ENCOUNTER — Encounter: Payer: Self-pay | Admitting: Physician Assistant

## 2018-07-11 ENCOUNTER — Ambulatory Visit: Payer: Medicare Other | Admitting: Physician Assistant

## 2018-07-11 ENCOUNTER — Ambulatory Visit
Admission: RE | Admit: 2018-07-11 | Discharge: 2018-07-11 | Disposition: A | Discharge status: Home / Self Care | Payer: Medicare Other | Source: Ambulatory Visit | Attending: Physician Assistant | Admitting: Physician Assistant

## 2018-07-11 ENCOUNTER — Ambulatory Visit
Admission: RE | Admit: 2018-07-11 | Discharge: 2018-07-11 | Disposition: A | Discharge status: Home / Self Care | Payer: Medicare Other | Attending: Physician Assistant | Admitting: Physician Assistant

## 2018-07-11 VITALS — BP 111/76 | HR 92 | Temp 97.7°F | Resp 16 | Wt 188.6 lb

## 2018-07-11 DIAGNOSIS — J181 Lobar pneumonia, unspecified organism: Secondary | ICD-10-CM

## 2018-07-11 DIAGNOSIS — Z20828 Contact with and (suspected) exposure to other viral communicable diseases: Secondary | ICD-10-CM

## 2018-07-11 DIAGNOSIS — R05 Cough: Secondary | ICD-10-CM

## 2018-07-11 DIAGNOSIS — J189 Pneumonia, unspecified organism: Secondary | ICD-10-CM

## 2018-07-11 DIAGNOSIS — R059 Cough, unspecified: Secondary | ICD-10-CM

## 2018-07-11 MED ORDER — OSELTAMIVIR PHOSPHATE 75 MG PO CAPS: 75.0000 mg | ORAL_CAPSULE | Freq: Every day | ORAL | 0 refills | Status: DC

## 2018-07-11 MED ORDER — HYDROCODONE-HOMATROPINE 5-1.5 MG/5ML PO SYRP: 5.0000 mL | ORAL_SOLUTION | Freq: Every evening | ORAL | 0 refills | Status: DC | PRN

## 2018-07-11 NOTE — Patient Instructions (Signed)

## 2018-07-11 NOTE — Progress Notes (Signed)
Patient: Joseph Barton Male    DOB: Apr 06, 1927   85 y.o.   MRN: [id number] Visit Date: 07/18/2018  Today's Provider: Trinna Post, PA-C   Chief Complaint  Patient presents with  . URI   Subjective:     URI   This is a new problem. The current episode started 1 to 4 weeks ago. The problem has been unchanged. There has been no fever. Associated symptoms include coughing, rhinorrhea, sneezing, a sore throat and wheezing. Pertinent negatives include no abdominal pain, chest pain, congestion, diarrhea, dysuria, ear pain, headaches, joint pain, joint swelling, nausea, neck pain, plugged ear sensation, rash, sinus pain, swollen glands or vomiting. Associated symptoms comments: Shortness of breath. Treatments tried: prescription cough syrup. The treatment provided mild relief.   Patients wife who is accompanied with him today states that she has concerns about exposure to flu because patient has bene around her mother who is hospitalized currently with Influenza A. Treated by doctor fisher for URI on 12.27.2019 with augmentin 1000 mg BID x 7 days.   Wt Readings from Last 3 Encounters:  07/18/18 172 lb 9.6 oz (78.3 kg)  07/15/18 186 lb (84.4 kg)  07/11/18 188 lb 9.6 oz (85.5 kg)     Allergies  Allergen Reactions  . Atorvastatin   . Simvastatin     No current facility-administered medications for this visit.  No current outpatient medications on file.  Facility-Administered Medications Ordered in Other Visits:  .  acetaminophen (TYLENOL) tablet 650 mg, 650 mg, Oral, Q6H PRN **OR** acetaminophen (TYLENOL) suppository 650 mg, 650 mg, Rectal, Q6H PRN, Gladstone Lighter, MD .  albuterol (PROVENTIL) (2.5 MG/3ML) 0.083% nebulizer solution 2.5 mg, 2.5 mg, Inhalation, Q6H PRN, Gladstone Lighter, MD .  carvedilol (COREG) tablet 3.125 mg, 3.125 mg, Oral, BID WC, Visser, Jacquelyn D, PA-C, 3.125 mg at 07/18/18 0743 .  ceFEPIme (MAXIPIME) 2 g in sodium chloride 0.9 % 100 mL IVPB, 2 g,  Intravenous, Q12H, Shari Prows, RPH, Last Rate: 200 mL/hr at 07/17/18 2136, 2 g at 07/17/18 2136 .  chlorpheniramine-HYDROcodone (TUSSIONEX) 10-8 MG/5ML suspension 5 mL, 5 mL, Oral, Q12H, Kalisetti, Radhika, MD, 5 mL at 07/18/18 1011 .  digoxin (LANOXIN) tablet 0.0625 mg, 0.0625 mg, Oral, Daily, Kalisetti, Radhika, MD, 0.0625 mg at 07/18/18 1011 .  furosemide (LASIX) tablet 40 mg, 40 mg, Oral, Daily, Tressia Miners, Radhika, MD, 40 mg at 07/18/18 1013 .  guaiFENesin (MUCINEX) 12 hr tablet 600 mg, 600 mg, Oral, BID, Gladstone Lighter, MD, 600 mg at 07/18/18 1013 .  lisinopril (PRINIVIL,ZESTRIL) tablet 2.5 mg, 2.5 mg, Oral, Daily, Epifanio Lesches, MD, 2.5 mg at 07/18/18 1013 .  menthol-cetylpyridinium (CEPACOL) lozenge 3 mg, 1 lozenge, Oral, PRN, Gladstone Lighter, MD .  ondansetron (ZOFRAN) tablet 4 mg, 4 mg, Oral, Q6H PRN **OR** ondansetron (ZOFRAN) injection 4 mg, 4 mg, Intravenous, Q6H PRN, Tressia Miners, Radhika, MD .  oseltamivir (TAMIFLU) capsule 30 mg, 30 mg, Oral, Daily, Tressia Miners, Radhika, MD, 30 mg at 07/18/18 1014 .  pantoprazole (PROTONIX) EC tablet 40 mg, 40 mg, Oral, Daily, Tressia Miners, Radhika, MD, 40 mg at 07/18/18 1013 .  potassium chloride (K-DUR,KLOR-CON) CR tablet 30 mEq, 30 mEq, Oral, BID, Visser, Jacquelyn D, PA-C, 30 mEq at 07/18/18 1013 .  senna-docusate (Senokot-S) tablet 1 tablet, 1 tablet, Oral, BID, Pyreddy, Pavan, MD, 1 tablet at 07/18/18 1013 .  warfarin (COUMADIN) tablet 1.25 mg, 1.25 mg, Oral, ONCE-1800, Shanlever, Pierce Crane, RPH .  warfarin (COUMADIN) tablet 2.5 mg, 2.5 mg,  Oral, ONCE-1800, Lu Duffel, [place] .  Warfarin - Pharmacist Dosing Inpatient, , Does not apply, q1800, Shari Prows, RPH  Review of Systems  HENT: Positive for rhinorrhea, sneezing and sore throat. Negative for congestion, ear pain and sinus pain.   Respiratory: Positive for cough and wheezing.   Cardiovascular: Negative for chest pain.  Gastrointestinal: Negative for  abdominal pain, diarrhea, nausea and vomiting.  Genitourinary: Negative for dysuria.  Musculoskeletal: Negative for joint pain and neck pain.  Skin: Negative for rash.  Neurological: Negative for headaches.    Social History   Tobacco Use  . Smoking status: Former Smoker    Last attempt to quit: 12/01/1987    Years since quitting: 30.6  . Smokeless tobacco: Never Used  Substance Use Topics  . Alcohol use: No      Objective:   BP 111/76   Pulse 92   Temp 97.7 F (36.5 C) (Oral)   Resp 16   Wt 188 lb 9.6 oz (85.5 kg)   SpO2 93%   BMI 24.55 kg/m  Vitals:   07/11/18 1206  BP: 111/76  Pulse: 92  Resp: 16  Temp: 97.7 F (36.5 C)  TempSrc: Oral  SpO2: 93%  Weight: 188 lb 9.6 oz (85.5 kg)     Physical Exam      Assessment & Plan    1. Exposure to influenza  Rapid flu negative today. Influenza prophylaxis as below due to wife having the flu.   - POCT Influenza A/B - oseltamivir (TAMIFLU) 75 MG capsule; Take 1 capsule (75 mg total) by mouth daily for 10 days.  Dispense: 10 capsule; Refill: 0  2. Pneumonia of right upper lobe due to infectious organism [facility])  CXR shows acute on chronic infiltrate in right lung. Start Levaquin 500 mg QD x 5 days and follow up Monday for re-evaluation and to check INR.   - DG Chest 2 View; Future - HYDROcodone-homatropine (HYCODAN) 5-1.5 MG/5ML syrup; Take 5 mLs by mouth at bedtime as needed for cough.  Dispense: 120 mL; Refill: 0  Return in about 4 days (around 07/15/2018).  The entirety of the information documented in the History of Present Illness, Review of Systems and Physical Exam were personally obtained by me. Portions of this information were initially documented by Lynford Humphrey, CMA and reviewed by me for thoroughness and accuracy.        Trinna Post, PA-C  [facility]

## 2018-07-12 ENCOUNTER — Other Ambulatory Visit: Payer: Self-pay | Admitting: Physician Assistant

## 2018-07-12 ENCOUNTER — Telehealth: Payer: Self-pay

## 2018-07-12 ENCOUNTER — Telehealth: Payer: Self-pay | Admitting: Family Medicine

## 2018-07-12 DIAGNOSIS — J181 Lobar pneumonia, unspecified organism: Principal | ICD-10-CM

## 2018-07-12 DIAGNOSIS — J189 Pneumonia, unspecified organism: Secondary | ICD-10-CM

## 2018-07-12 MED ORDER — LEVOFLOXACIN 500 MG PO TABS: 500.0000 mg | ORAL_TABLET | Freq: Every day | ORAL | 0 refills | Status: DC

## 2018-07-12 NOTE — Telephone Encounter (Signed)
Pt's Daughter Tonnie Friedel - [phone] Pt is not taking an antibiotic. He finished the one he was prescribed. Pt was told he has pneumonia in right lung. Wanting pt on antibiotic for pneumonia Today.  Please call into:  All future medications will go to: [facility]

## 2018-07-12 NOTE — Telephone Encounter (Signed)
Daughter called and said abx was sent into the wrong pharmacy. Sent into [facility].

## 2018-07-12 NOTE — Telephone Encounter (Signed)
Patient's wife now calling and wanting Levaquin sent into [facility]. The patient was confused on instructions, and he is NOT currently taking an abx. Please send in. Thanks!

## 2018-07-12 NOTE — Telephone Encounter (Signed)
-----   Message from Trinna Post, [place] sent at 07/12/2018  8:58 AM EST ----- It looks like there actually could be some early findings of pneumonia in his right lobe. I want him to take an antibiotic Levaquin 500 mg daily for five days. I would also like him to follow up in clinic on Monday to see how he is feeling and to recheck his INR.

## 2018-07-12 NOTE — Telephone Encounter (Signed)
Spoke to patient and he states he has already stated the medication. And going to call back later to schedule appointment.

## 2018-07-15 ENCOUNTER — Emergency Department: Payer: Medicare Other

## 2018-07-15 ENCOUNTER — Ambulatory Visit (INDEPENDENT_AMBULATORY_CARE_PROVIDER_SITE_OTHER): Payer: Medicare Other | Admitting: Physician Assistant

## 2018-07-15 ENCOUNTER — Inpatient Hospital Stay
Admission: EM | Admit: 2018-07-15 | Discharge: 2018-07-20 | DRG: 194 | Disposition: A | Discharge status: Home / Self Care | Payer: Medicare Other | Attending: Internal Medicine | Admitting: Internal Medicine

## 2018-07-15 ENCOUNTER — Other Ambulatory Visit: Payer: Self-pay

## 2018-07-15 ENCOUNTER — Encounter: Payer: Self-pay | Admitting: Physician Assistant

## 2018-07-15 VITALS — BP 97/58 | HR 96 | Temp 97.6°F | Resp 20 | Wt 186.0 lb

## 2018-07-15 DIAGNOSIS — I452 Bifascicular block: Secondary | ICD-10-CM | POA: Diagnosis present

## 2018-07-15 DIAGNOSIS — R634 Abnormal weight loss: Secondary | ICD-10-CM

## 2018-07-15 DIAGNOSIS — E1142 Type 2 diabetes mellitus with diabetic polyneuropathy: Secondary | ICD-10-CM | POA: Diagnosis present

## 2018-07-15 DIAGNOSIS — I5042 Chronic combined systolic (congestive) and diastolic (congestive) heart failure: Secondary | ICD-10-CM | POA: Diagnosis not present

## 2018-07-15 DIAGNOSIS — Z955 Presence of coronary angioplasty implant and graft: Secondary | ICD-10-CM | POA: Diagnosis not present

## 2018-07-15 DIAGNOSIS — J029 Acute pharyngitis, unspecified: Secondary | ICD-10-CM

## 2018-07-15 DIAGNOSIS — Z8249 Family history of ischemic heart disease and other diseases of the circulatory system: Secondary | ICD-10-CM

## 2018-07-15 DIAGNOSIS — I255 Ischemic cardiomyopathy: Secondary | ICD-10-CM | POA: Diagnosis not present

## 2018-07-15 DIAGNOSIS — I959 Hypotension, unspecified: Secondary | ICD-10-CM | POA: Diagnosis not present

## 2018-07-15 DIAGNOSIS — E1151 Type 2 diabetes mellitus with diabetic peripheral angiopathy without gangrene: Secondary | ICD-10-CM | POA: Diagnosis present

## 2018-07-15 DIAGNOSIS — R29818 Other symptoms and signs involving the nervous system: Secondary | ICD-10-CM | POA: Diagnosis not present

## 2018-07-15 DIAGNOSIS — J189 Pneumonia, unspecified organism: Secondary | ICD-10-CM | POA: Diagnosis present

## 2018-07-15 DIAGNOSIS — I4821 Permanent atrial fibrillation: Secondary | ICD-10-CM | POA: Diagnosis not present

## 2018-07-15 DIAGNOSIS — R0902 Hypoxemia: Secondary | ICD-10-CM | POA: Diagnosis present

## 2018-07-15 DIAGNOSIS — I081 Rheumatic disorders of both mitral and tricuspid valves: Secondary | ICD-10-CM | POA: Diagnosis present

## 2018-07-15 DIAGNOSIS — I130 Hypertensive heart and chronic kidney disease with heart failure and stage 1 through stage 4 chronic kidney disease, or unspecified chronic kidney disease: Secondary | ICD-10-CM | POA: Diagnosis not present

## 2018-07-15 DIAGNOSIS — I340 Nonrheumatic mitral (valve) insufficiency: Secondary | ICD-10-CM | POA: Diagnosis not present

## 2018-07-15 DIAGNOSIS — R05 Cough: Secondary | ICD-10-CM

## 2018-07-15 DIAGNOSIS — J180 Bronchopneumonia, unspecified organism: Secondary | ICD-10-CM | POA: Diagnosis not present

## 2018-07-15 DIAGNOSIS — E861 Hypovolemia: Secondary | ICD-10-CM | POA: Diagnosis present

## 2018-07-15 DIAGNOSIS — Z8679 Personal history of other diseases of the circulatory system: Secondary | ICD-10-CM

## 2018-07-15 DIAGNOSIS — N183 Chronic kidney disease, stage 3 (moderate): Secondary | ICD-10-CM | POA: Diagnosis present

## 2018-07-15 DIAGNOSIS — I509 Heart failure, unspecified: Secondary | ICD-10-CM | POA: Diagnosis not present

## 2018-07-15 DIAGNOSIS — I4891 Unspecified atrial fibrillation: Secondary | ICD-10-CM

## 2018-07-15 DIAGNOSIS — J9601 Acute respiratory failure with hypoxia: Secondary | ICD-10-CM | POA: Diagnosis not present

## 2018-07-15 DIAGNOSIS — R0603 Acute respiratory distress: Secondary | ICD-10-CM | POA: Diagnosis not present

## 2018-07-15 DIAGNOSIS — E785 Hyperlipidemia, unspecified: Secondary | ICD-10-CM | POA: Diagnosis present

## 2018-07-15 DIAGNOSIS — E1122 Type 2 diabetes mellitus with diabetic chronic kidney disease: Secondary | ICD-10-CM | POA: Diagnosis not present

## 2018-07-15 DIAGNOSIS — E876 Hypokalemia: Secondary | ICD-10-CM | POA: Diagnosis not present

## 2018-07-15 DIAGNOSIS — Z66 Do not resuscitate: Secondary | ICD-10-CM | POA: Diagnosis present

## 2018-07-15 DIAGNOSIS — R6889 Other general symptoms and signs: Secondary | ICD-10-CM

## 2018-07-15 DIAGNOSIS — Z7901 Long term (current) use of anticoagulants: Secondary | ICD-10-CM

## 2018-07-15 DIAGNOSIS — Z79899 Other long term (current) drug therapy: Secondary | ICD-10-CM

## 2018-07-15 DIAGNOSIS — J181 Lobar pneumonia, unspecified organism: Secondary | ICD-10-CM

## 2018-07-15 DIAGNOSIS — Z87891 Personal history of nicotine dependence: Secondary | ICD-10-CM

## 2018-07-15 DIAGNOSIS — E440 Moderate protein-calorie malnutrition: Secondary | ICD-10-CM

## 2018-07-15 DIAGNOSIS — R0602 Shortness of breath: Secondary | ICD-10-CM | POA: Diagnosis not present

## 2018-07-15 DIAGNOSIS — R410 Disorientation, unspecified: Secondary | ICD-10-CM | POA: Diagnosis not present

## 2018-07-15 DIAGNOSIS — I25118 Atherosclerotic heart disease of native coronary artery with other forms of angina pectoris: Secondary | ICD-10-CM | POA: Diagnosis present

## 2018-07-15 DIAGNOSIS — Z85828 Personal history of other malignant neoplasm of skin: Secondary | ICD-10-CM

## 2018-07-15 DIAGNOSIS — R000 Tachycardia, unspecified: Secondary | ICD-10-CM | POA: Diagnosis present

## 2018-07-15 DIAGNOSIS — Z833 Family history of diabetes mellitus: Secondary | ICD-10-CM

## 2018-07-15 DIAGNOSIS — Z6824 Body mass index (BMI) 24.0-24.9, adult: Secondary | ICD-10-CM | POA: Diagnosis not present

## 2018-07-15 DIAGNOSIS — J439 Emphysema, unspecified: Secondary | ICD-10-CM | POA: Diagnosis not present

## 2018-07-15 DIAGNOSIS — Z888 Allergy status to other drugs, medicaments and biological substances status: Secondary | ICD-10-CM

## 2018-07-15 DIAGNOSIS — R059 Cough, unspecified: Secondary | ICD-10-CM

## 2018-07-15 LAB — COMPREHENSIVE METABOLIC PANEL
ALT: 13 U/L (ref 0–44)
ANION GAP: 7 (ref 5–15)
AST: 20 U/L (ref 15–41)
Albumin: 3.6 g/dL (ref 3.5–5.0)
Alkaline Phosphatase: 78 U/L (ref 38–126)
BUN: 24 mg/dL — ABNORMAL HIGH (ref 8–23)
CO2: 25 mmol/L (ref 22–32)
Calcium: 9 mg/dL (ref 8.9–10.3)
Chloride: 105 mmol/L (ref 98–111)
Creatinine, Ser: 1.17 mg/dL (ref 0.61–1.24)
GFR calc Af Amer: >60 mL/min (ref >60)
GFR calc non Af Amer: 55 mL/min — ABNORMAL LOW (ref >60)
Glucose, Bld: 118 mg/dL — ABNORMAL HIGH (ref 70–99)
Potassium: 4.2 mmol/L (ref 3.5–5.1)
Sodium: 137 mmol/L (ref 135–145)
Total Bilirubin: 0.8 mg/dL (ref 0.3–1.2)
Total Protein: 7.7 g/dL (ref 6.5–8.1)

## 2018-07-15 LAB — CBC WITH DIFFERENTIAL/PLATELET
Abs Immature Granulocytes: 0.04 10*3/uL (ref 0.00–0.07)
Basophils Absolute: 0.1 10*3/uL (ref 0.0–0.1)
Basophils Relative: 1 %
Eosinophils Absolute: 0.1 10*3/uL (ref 0.0–0.5)
Eosinophils Relative: 1 %
HCT: 43.9 % (ref 39.0–52.0)
Hemoglobin: 14.1 g/dL (ref 13.0–17.0)
Immature Granulocytes: 1 %
Lymphocytes Relative: 20 %
Lymphs Abs: 1.5 10*3/uL (ref 0.7–4.0)
MCH: 30 pg (ref 26.0–34.0)
MCHC: 32.1 g/dL (ref 30.0–36.0)
MCV: 93.4 fL (ref 80.0–100.0)
Monocytes Absolute: 0.8 10*3/uL (ref 0.1–1.0)
Monocytes Relative: 11 %
Neutro Abs: 4.9 10*3/uL (ref 1.7–7.7)
Neutrophils Relative %: 66 %
Platelets: 193 10*3/uL (ref 150–400)
RBC: 4.7 MIL/uL (ref 4.22–5.81)
RDW: 13.4 % (ref 11.5–15.5)
WBC: 7.4 10*3/uL (ref 4.0–10.5)
nRBC: 0 % (ref 0.0–0.2)

## 2018-07-15 LAB — PROTIME-INR
INR: 2.43
Prothrombin Time: 26.1 seconds — ABNORMAL HIGH (ref 11.4–15.2)

## 2018-07-15 LAB — PROCALCITONIN: Procalcitonin: <0.1 ng/mL

## 2018-07-15 LAB — URINALYSIS, COMPLETE (UACMP) WITH MICROSCOPIC
Bacteria, UA: NONE SEEN
Bilirubin Urine: NEGATIVE
Glucose, UA: NEGATIVE mg/dL
Hgb urine dipstick: NEGATIVE
Ketones, ur: 5 mg/dL — AB
Leukocytes, UA: NEGATIVE
Nitrite: NEGATIVE
Protein, ur: NEGATIVE mg/dL
pH: 5 (ref 5.0–8.0)

## 2018-07-15 LAB — TROPONIN I

## 2018-07-15 LAB — GROUP A STREP BY PCR: Group A Strep by PCR: NOT DETECTED

## 2018-07-15 LAB — BRAIN NATRIURETIC PEPTIDE: B Natriuretic Peptide: 612 pg/mL — ABNORMAL HIGH (ref 0.0–100.0)

## 2018-07-15 MED ORDER — SODIUM CHLORIDE 0.9 % IV SOLN
2.0000 g | Freq: Two times a day (BID) | INTRAVENOUS | Status: AC
  Administered 2018-07-15 – 2018-07-19 (×9): 2 g via INTRAVENOUS
  Filled 2018-07-15 (×12): qty 2

## 2018-07-15 MED ORDER — VANCOMYCIN HCL 10 G IV SOLR
1250.0000 mg | Freq: Once | INTRAVENOUS | Status: AC
  Administered 2018-07-15: 1250 mg via INTRAVENOUS
  Filled 2018-07-15: qty 1250

## 2018-07-15 MED ORDER — IOHEXOL 350 MG/ML SOLN
75.0000 mL | Freq: Once | INTRAVENOUS | Status: AC | PRN
  Administered 2018-07-15: 75 mL via INTRAVENOUS

## 2018-07-15 MED ORDER — WARFARIN - PHARMACIST DOSING INPATIENT
Freq: Every day | Status: DC
  Administered 2018-07-16: 19:00:00
  Filled 2018-07-15: qty 1

## 2018-07-15 MED ORDER — VANCOMYCIN HCL IN DEXTROSE 1-5 GM/200ML-% IV SOLN: 1000.0000 mg | INTRAVENOUS | Status: DC

## 2018-07-15 MED ORDER — SODIUM CHLORIDE 0.9 % IV BOLUS
250.0000 mL | Freq: Once | INTRAVENOUS | Status: AC
  Administered 2018-07-15: 250 mL via INTRAVENOUS

## 2018-07-15 MED ORDER — WARFARIN 1.25 MG HALF TABLET
1.2500 mg | ORAL_TABLET | Freq: Once | ORAL | Status: AC
  Administered 2018-07-15: 1.25 mg via ORAL
  Filled 2018-07-15 (×3): qty 1

## 2018-07-15 NOTE — Consult Note (Signed)
ANTICOAGULATION CONSULT NOTE - Follow Up Consult  Pharmacy Consult for Warfarin dosing Indication: atrial fibrillation  Allergies  Allergen Reactions  . Atorvastatin   . Simvastatin     Patient Measurements: Height: 6\' 1"  (185.4 cm) Weight: 186 lb (84.4 kg) IBW/kg (Calculated) : 79.9 Heparin Dosing Weight:   Vital Signs: Temp: 98.7 F (37.1 C) (01/13 1520) Temp Source: Oral (01/13 1520) BP: 109/93 (01/13 1734) Pulse Rate: 96 (01/13 1734)  Labs: Recent Labs    07/15/18 1524 07/15/18 1530  HGB 14.1  --   HCT 43.9  --   PLT 193  --   LABPROT  --  26.1*  INR  --  2.43  CREATININE 1.17  --   TROPONINI <0.03  --     Estimated Creatinine Clearance: 48.4 mL/min (by C-G formula based on SCr of 1.17 mg/dL).   Medications:  (Not in a hospital admission)   Assessment: 85 y.o. male with a known history of CAD status post PCI, congestive heart failure with systolic dysfunction, hypertension, mitral regurgitation, abdominal aortic aneurysm repair presents to hospital secondary to worsening weakness, cough and shortness of breath.  Home dose: Per chart review patient followed by anticoag clinic with recent encounter notated to have dose of: Warfarin 2.5 mg every Sun,Tues,Thurs,Sat Warfarin 1.25 mg every MWF  Goal of Therapy:  INR 2-3 Monitor platelets by anticoagulation protocol: Yes   Plan:  Current INR 2.43 Will continue current home regimen and order warfarin 1.25 mg once. Will drawn INR and CBC daily while patient is on antibiotics.  Forrest Moron, PharmD Clinical Pharmacist 07/15/2018,6:56 PM

## 2018-07-15 NOTE — Progress Notes (Signed)
Patient: Joseph Barton Male    DOB: 1926/09/07   85 y.o.   MRN: [id number] Visit Date: 07/15/2018  Today's Provider: Trinna Post, PA-C   Chief Complaint  Patient presents with  . Follow-up   Subjective:     HPI   Patient with history of atrial fibrillation on coumadin therapy, chronic systolic heart failure, COPD. Patient was seen initially by Dr. Caryn Section on 12.27.2019 and was treated for a URI with 1000 mg amoxicillin BID x 7 days. Patient presented to clinic on 07/11/2018 with continued cough, sore throat. His pulse ox hovered between 93-95% when his baseline was 98%. He did not have lower extremity edema at the time. His wife had been hospitalized with influenza at the time and he tested negative for influenza on 07/11/2018. He was treated with prophylactic dose of Tamiflu 75 mg QD x 10 days. CXR was ordered and revealed "chronic changes with apparent acute on chronic infiltrate in the lateral aspect of the right upper lobe. Follow up PA and lateral CXR is recommended in 3-4 weeks following trial of antibiotic therapy to ensure resolution and exclude underlying malignancy. Levaquin 500 mg QD x 5 days.   Patient began Levaquin on Friday and is on day 4 of antibiotics today. His daughter who accompanied him during last visit presents with him again today. She reports he is actually doing worse. He is coughing up copious amounts of secretions. He has lost 2 lbs from last visit and 10 lbs overall in 1.5 months. His blood pressure is low today, he denies dizziness. He denies chest pain. He says his throat has topped hurting.   Wt Readings from Last 3 Encounters:  07/15/18 186 lb (84.4 kg)  07/11/18 188 lb 9.6 oz (85.5 kg)  06/28/18 191 lb (86.6 kg)   BP Readings from Last 8 Encounters:  07/15/18 (!) 97/58  07/11/18 111/76  06/28/18 (!) 80/52  06/20/18 127/80  05/23/18 118/70  04/22/18 115/81  02/19/18 108/62  01/16/18 121/62      Allergies  Allergen Reactions  .  Atorvastatin   . Simvastatin      Current Outpatient Medications:  .  albuterol (PROVENTIL HFA;VENTOLIN HFA) 108 (90 Base) MCG/ACT inhaler, Inhale 2 puffs into the lungs every 6 (six) hours as needed for wheezing or shortness of breath., Disp: 1 Inhaler, Rfl: 0 .  Bioflavonoid Products (BIOFLEX PO), Take 1 tablet by mouth daily. , Disp: , Rfl:  .  digoxin (LANOXIN) 0.125 MG tablet, Take 0.0625 mg by mouth daily. , Disp: , Rfl:  .  furosemide (LASIX) 20 MG tablet, Take 40 mg by mouth daily., Disp: , Rfl:  .  HYDROcodone-homatropine (HYCODAN) 5-1.5 MG/5ML syrup, Take 5 mLs by mouth at bedtime as needed for cough., Disp: 120 mL, Rfl: 0 .  levofloxacin (LEVAQUIN) 500 MG tablet, Take 1 tablet (500 mg total) by mouth daily., Disp: 5 tablet, Rfl: 0 .  lisinopril (PRINIVIL,ZESTRIL) 2.5 MG tablet, Take 2.5 mg by mouth daily., Disp: , Rfl:  .  metoprolol succinate (TOPROL-XL) 25 MG 24 hr tablet, TAKE ONE-HALF TABLET BY  MOUTH DAILY, Disp: 45 tablet, Rfl: 0 .  omeprazole (PRILOSEC) 20 MG capsule, TAKE 1 CAPSULE BY MOUTH  DAILY, Disp: 90 capsule, Rfl: 3 .  oseltamivir (TAMIFLU) 75 MG capsule, Take 1 capsule (75 mg total) by mouth daily for 10 days., Disp: 10 capsule, Rfl: 0 .  sacubitril-valsartan (ENTRESTO) 24-26 MG, Take 1 tablet by mouth 2 (two) times daily.,  Disp: 60 tablet, Rfl: 6 .  warfarin (COUMADIN) 2.5 MG tablet, Take 1 tablet (2.5 mg total) by mouth as directed. (Patient taking differently: Take  tablet (1.25MG ) by mouth Monday, Wednesday, Friday and take 1 tablet (2.5MG ) by mouth Tuesday, Thursday, Saturday and Sunday), Disp: 30 tablet, Rfl: 12 .  potassium chloride 20 MEQ TBCR, Take 20 mEq by mouth daily., Disp: 60 tablet, Rfl: 1  Review of Systems  Constitutional: Positive for activity change, appetite change and fatigue.  HENT: Positive for congestion and sore throat.   Respiratory: Positive for cough, shortness of breath and wheezing.     Social History   Tobacco Use  . Smoking  status: Former Smoker    Last attempt to quit: 12/01/1987    Years since quitting: 30.6  . Smokeless tobacco: Never Used  Substance Use Topics  . Alcohol use: No      Objective:   BP (!) 97/58 (BP Location: Left Arm, Patient Position: Sitting, Cuff Size: Normal)   Pulse 96   Temp 97.6 F (36.4 C) (Oral)   Resp 20   Wt 186 lb (84.4 kg)   SpO2 96%   BMI 24.21 kg/m  Vitals:   07/15/18 1336  BP: (!) 97/58  Pulse: 96  Resp: 20  Temp: 97.6 F (36.4 C)  TempSrc: Oral  SpO2: 96%  Weight: 186 lb (84.4 kg)     Physical Exam Vitals signs reviewed.  Constitutional:      Appearance: He is ill-appearing.  Cardiovascular:     Rate and Rhythm: Normal rate.  Pulmonary:     Effort: Tachypnea present. No accessory muscle usage.  Neurological:     General: No focal deficit present.     Mental Status: He is alert and oriented to person, place, and time.  Psychiatric:        Mood and Affect: Mood normal.        Behavior: Behavior normal.         Assessment & Plan    1. Pneumonia of right upper lobe due to infectious organism [facility])  Patient looks worse today than he did on Thursday. He has chronic cardiopulmonary comorbidities including CHF and atrial fibrillation on chronic coumadin therapy. He has been feeling poorly for 2.5 weeks with CXR on 07/11/2018 showing early infiltrate representing right lobar pneumonia. He was treated empirically with Levaquin due to age, comorbidities, recent antibiotic use. Today, his blood pressure is hypotensive though he denies symptoms. He is tachypneic and looks overall worse today. I am concerned for failure of outpatient therapy in treating his penumonia and I am sending him to the ER to be evaluated for admission.  Return if symptoms worsen or fail to improve.  The entirety of the information documented in the History of Present Illness, Review of Systems and Physical Exam were personally obtained by me. Portions of this information were  initially documented by Lynford Humphrey, CMA and reviewed by me for thoroughness and accuracy.   I have spent 25 minutes with this patient, >50% of which was spent on counseling and coordination of care.       Trinna Post, PA-C  [facility]

## 2018-07-15 NOTE — Consult Note (Signed)
Pharmacy Antibiotic Note  Joseph Barton is a 85 y.o. male admitted on 07/15/2018 with pneumonia.  Pharmacy has been consulted for cefepime and vancomycin dosing.  Plan: Cefepime 2 gm IV every 12 hours (renally adjusted)  Loading dose: Vancomycin 1250 mg IV once Vancomycin 1000 mg IV Q 36 hrs. Goal AUC 400-550. Expected AUC: 514.9 SCr used: 1.17  Height: 6\' 1"  (185.4 cm) Weight: 186 lb (84.4 kg) IBW/kg (Calculated) : 79.9  Temp (24hrs), Avg:98.2 F (36.8 C), Min:97.6 F (36.4 C), Max:98.7 F (37.1 C)  Recent Labs  Lab 07/15/18 1524  WBC 7.4  CREATININE 1.17    Estimated Creatinine Clearance: 48.4 mL/min (by C-G formula based on SCr of 1.17 mg/dL).    Allergies  Allergen Reactions  . Atorvastatin   . Simvastatin     Antimicrobials this admission: Cefepime 1/13 >>  Vanco 1/13 >>   Dose adjustments this admission:   Microbiology results:  BCx:   UCx:    Sputum:   1/13 MRSA PCR: pending 1/13 PCT: pending  Thank you for allowing pharmacy to be a part of this patient's care.  Forrest Moron, PharmD Clinical Pharmacist 07/15/2018 6:46 PM

## 2018-07-15 NOTE — ED Notes (Signed)
[phone]  Daughter Cliffton Asters

## 2018-07-15 NOTE — ED Notes (Signed)
Patient transported to CT 

## 2018-07-15 NOTE — Patient Instructions (Signed)
Community-Acquired Pneumonia, Adult  Pneumonia is an infection of the lungs. It causes swelling in the airways of the lungs. Mucus and fluid may also build up inside the airways.  One type of pneumonia can happen while a person is in a hospital. A different type can happen when a person is not in a hospital (community-acquired pneumonia).   What are the causes?    This condition is caused by germs (viruses, bacteria, or fungi). Some types of germs can be passed from one person to another. This can happen when you breathe in droplets from the cough or sneeze of an infected person.  What increases the risk?  You are more likely to develop this condition if you:   Have a long-term (chronic) disease, such as:  ? Chronic obstructive pulmonary disease (COPD).  ? Asthma.  ? Cystic fibrosis.  ? Congestive heart failure.  ? Diabetes.  ? Kidney disease.   Have HIV.   Have sickle cell disease.   Have had your spleen removed.   Do not take good care of your teeth and mouth (poor dental hygiene).   Have a medical condition that increases the risk of breathing in droplets from your own mouth and nose.   Have a weakened body defense system (immune system).   Are a smoker.   Travel to areas where the germs that cause this illness are common.   Are around certain animals or the places they live.  What are the signs or symptoms?   A dry cough.   A wet (productive) cough.   Fever.   Sweating.   Chest pain. This often happens when breathing deeply or coughing.   Fast breathing or trouble breathing.   Shortness of breath.   Shaking chills.   Feeling tired (fatigue).   Muscle aches.  How is this treated?  Treatment for this condition depends on many things. Most adults can be treated at home. In some cases, treatment must happen in a hospital. Treatment may include:   Medicines given by mouth or through an IV tube.   Being given extra oxygen.   Respiratory therapy.  In rare cases, treatment for very bad pneumonia  may include:   Using a machine to help you breathe.   Having a procedure to remove fluid from around your lungs.  Follow these instructions at home:  Medicines   Take over-the-counter and prescription medicines only as told by your doctor.  ? Only take cough medicine if you are losing sleep.   If you were prescribed an antibiotic medicine, take it as told by your doctor. Do not stop taking the antibiotic even if you start to feel better.  General instructions     Sleep with your head and neck raised (elevated). You can do this by sleeping in a recliner or by putting a few pillows under your head.   Rest as needed. Get at least 8 hours of sleep each night.   Drink enough water to keep your pee (urine) pale yellow.   Eat a healthy diet that includes plenty of vegetables, fruits, whole grains, low-fat dairy products, and lean protein.   Do not use any products that contain nicotine or tobacco. These include cigarettes, e-cigarettes, and chewing tobacco. If you need help quitting, ask your doctor.   Keep all follow-up visits as told by your doctor. This is important.  How is this prevented?  A shot (vaccine) can help prevent pneumonia. Shots are often suggested for:   People   older than 83 years of age.   People older than 83 years of age who:  ? Are having cancer treatment.  ? Have long-term (chronic) lung disease.  ? Have problems with their body's defense system.  You may also prevent pneumonia if you take these actions:   Get the flu (influenza) shot every year.   Go to the dentist as often as told.   Wash your hands often. If you cannot use soap and water, use hand sanitizer.  Contact a doctor if:   You have a fever.   You lose sleep because your cough medicine does not help.  Get help right away if:   You are short of breath and it gets worse.   You have more chest pain.   Your sickness gets worse. This is very serious if:  ? You are an older adult.  ? Your body's defense system is weak.   You  cough up blood.  Summary   Pneumonia is an infection of the lungs.   Most adults can be treated at home. Some will need treatment in a hospital.   Drink enough water to keep your pee pale yellow.   Get at least 8 hours of sleep each night.  This information is not intended to replace advice given to you by your health care provider. Make sure you discuss any questions you have with your health care provider.  Document Released: 12/06/2007 Document Revised: 02/14/2018 Document Reviewed: 02/14/2018  Elsevier Interactive Patient Education  2019 [employer]

## 2018-07-15 NOTE — ED Notes (Signed)
Pharmacy messaged about warfarin tablet and IV Maxipime with a follow up call. Pharmacy acknowledged messages and will send meds ASAP.

## 2018-07-15 NOTE — ED Provider Notes (Signed)
[facility] Emergency Department Provider Note  ____________________________________________  Time seen: Approximately 5:22 PM  I have reviewed the triage vital signs and the nursing notes.   HISTORY  Chief Complaint Shortness of Breath    HPI Joseph Barton is a 85 y.o. male with a history of HTN, HL, severe mitral regurgitation, ischemic cardiomyopathy, A. fib on Coumadin sent by his PCPs office for 1 month of progressive decline.  The patient is accompanied by his daughter.  They report that since 11/21, he has had a chronic cough and progressive decreased exercise tolerance.  Since early December, he has had a sore throat and hoarse voice.  He has had a significant weight loss.  He has been eating and drinking less.  At this time, the patient can only go from his recliner to the bathroom and back, which is very unusual for him.  His daughter states that he intermittently has confusion, although he has a normal mental status here.  Past Medical History:  Diagnosis Date  . Abdominal aortic aneurysm ([place]) 01-2004   4.7 x 4.7 cm.  Marland Kitchen CAD (coronary artery disease) 01/25/2004   a. 01/2004 Ant MI/DES to LAD;  b. 10/2011 Neg MV.  . CHF (congestive heart failure) ([place])   . Chronic systolic heart failure ([place])    a. 08/2016 Echo: EF 35%, nl RV fxn, mod to sev MR, mild to mod TR, mod biatrial enlargement.  . Hyperlipidemia   . Hypertension   . Ischemic cardiomyopathy    a. 08/2016 Echo: EF 35%.  . Moderate to Severe Mitral regurgitation    a. 08/2016 Echo: Mod-sev MR.  Marland Kitchen Permanent atrial fibrillation    a. Chronic coumadin (CHA2DS2VASc = 5).  . Pulmonary nodules    Noted on abdominal CT  . Statin intolerance     Patient Active Problem List   Diagnosis Date Noted  . Hypotension 12/12/2017  . CHF (congestive heart failure) ([place]) 04/28/2018  . Subclinical hypothyroidism 10/06/2015  . Allergic rhinitis 09/10/2015  . Bradycardia 09/10/2015  . Failure of erection  09/10/2015  . Blood glucose elevated 09/10/2015  . BP (high blood pressure) 09/10/2015  . Neuropathy 09/10/2015  . Apnea, sleep 09/10/2015  . Cancer of skin, squamous cell 09/10/2015  . Acid reflux 04/28/2015  . Arthritis, degenerative 12/25/2014  . Cardiomyopathy, ischemic 09/27/2013  . HLD (hyperlipidemia) 09/27/2013  . Lung nodule, multiple 09/27/2013  . Drug intolerance 09/27/2013  . Ischemic cardiomyopathy 12/01/2011  . Atrial fibrillation ([place]) 12/01/2011  . Chronic systolic heart failure ([place]) 12/01/2011  . IVCD (intraventricular conduction defect) 12/01/2011  . Intraventricular block 12/01/2011  . 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 CAD S/P percutaneous coronary angioplasty 01/25/2004  . Myocardial infarction ([place]) 01/25/2004  . AAA (abdominal aortic aneurysm) ([place]) 01/01/2004    Past Surgical History:  Procedure Laterality Date  . ABDOMINAL AORTIC ANEURYSM REPAIR     12/02/2007 [facility]  . ABDOMINAL AORTIC ANEURYSM REPAIR  2006   [facility]   . CARDIAC CATHETERIZATION  2009   [facility]  . CORONARY ANGIOPLASTY WITH STENT PLACEMENT  2005   Cypher stent LAD   . Cypher stents to LAD     01/25/2004  . SKIN SURGERY  2016   [facility] skin cancer    Current Outpatient Rx  . Order #: [id number] Class: Normal  . Order #: [id number] Class: Historical Med  . Order #: [id number] Class: Historical Med  . Order #: [id number] Class: Historical Med  . Order #: [id number] Class: Normal  .  Order #: [id number] Class: Normal  . Order #: [id number] Class: Historical Med  . Order #: [id number] Class: Normal  . Order #: [id number] Class: Normal  . Order #: [id number] Class: Normal  . Order #: [id number] Class: Print  . Order #: [id number] Class: Normal  . Order #: [id number] Class: Normal    Allergies Atorvastatin and Simvastatin  Family History  Problem Relation Age of Onset  . Heart attack Father 7       Died w/ Heart attack  . Diabetes Father   . Heart attack Brother   . Heart  disease Brother   . Diabetes Sister   . Heart disease Sister     Social History Social History   Tobacco Use  . Smoking status: Former Smoker    Last attempt to quit: 12/01/1987    Years since quitting: 30.6  . Smokeless tobacco: Never Used  Substance Use Topics  . Alcohol use: No  . Drug use: No    Review of Systems Constitutional: No fever/chills.  No lightheadedness or syncope.  No trauma. Eyes: No visual changes.  No blurred or double vision. ENT: Positive sore throat. No congestion or rhinorrhea. Cardiovascular: Denies chest pain. Denies palpitations. Respiratory: Positive decreased exercise tolerance and shortness of breath.  No cough. Gastrointestinal: No abdominal pain.  No nausea, no vomiting.  No diarrhea.  No constipation. Genitourinary: Negative for dysuria. Musculoskeletal: Negative for back pain.  No lower extremity edema. Skin: Negative for rash. Neurological: Negative for headaches. No focal numbness, tingling or weakness.     ____________________________________________   PHYSICAL EXAM:  VITAL SIGNS: ED Triage Vitals  Enc Vitals Group     BP 07/15/18 1520 102/75     Pulse Rate 07/15/18 1520 87     Resp 07/15/18 1520 18     Temp 07/15/18 1520 98.7 F (37.1 C)     Temp Source 07/15/18 1520 Oral     SpO2 07/15/18 1520 97 %     Weight 07/15/18 1522 186 lb (84.4 kg)     Height 07/15/18 1522 6\' 1"  (1.854 m)     Head Circumference --      Peak Flow --      Pain Score 07/15/18 1521 0     Pain Loc --      Pain Edu? --      Excl. in [place]? --     Constitutional: Patient is alert and oriented x3.  GCS is 15. Eyes: Conjunctivae are normal.  EOMI. PERRLA.  No scleral icterus. Head: Atraumatic. Nose: No congestion/rhinnorhea. Mouth/Throat: Mucous membranes are moist.  Patient is wearing dentures.  On the posterior palate, he has some ecchymotic changes that may be due to his upper denture plate.  He does not have any posterior pharyngeal erythema, tonsillar  swelling or exudate.  The posterior palate is symmetric and the uvula is midline.  No stridor or drooling.  No trismus. Mild hoarse voice. Neck: No stridor.  Supple.  No JVD.  No meningismus. Cardiovascular: Normal rate, regular rhythm. No murmurs, rubs or gallops.  Respiratory: Normal respiratory effort.  No accessory muscle use or retractions.  Right upper lobe with crackles.  No wheezes or rales. Gastrointestinal: Soft, nontender and nondistended.  No guarding or rebound.  No peritoneal signs. Musculoskeletal: No LE edema. No ttp in the calves or palpable cords.  Negative Homan's sign. Neurologic:  A&Ox3.  Speech is clear.  Face and smile are symmetric.  EOMI.  Moves all extremities well. Skin:  Skin is warm, dry and intact. No rash  noted. Psychiatric: Mood and affect are normal. Speech and behavior are normal.  Normal judgement.  ____________________________________________   LABS (all labs ordered are listed, but only abnormal results are displayed)  Labs Reviewed  COMPREHENSIVE METABOLIC PANEL - Abnormal; Notable for the following components:      Result Value   Glucose, Bld 118 (*)    BUN 24 (*)    GFR calc non Af Amer 55 (*)    All other components within normal limits  GROUP A STREP BY PCR  CBC WITH DIFFERENTIAL/PLATELET  TROPONIN I  BRAIN NATRIURETIC PEPTIDE  URINALYSIS, COMPLETE (UACMP) WITH MICROSCOPIC  PROTIME-INR  BLOOD GAS, VENOUS   ____________________________________________  EKG  ED ECG REPORT I, Anne-Caroline Mariea Clonts, the attending physician, personally viewed and interpreted this ECG.   Date: 07/15/2018  EKG Time: 1532  Rate: 102  Rhythm: atrial fibrillation  Axis: leftward  Intervals:none  ST&T Change: No STEMI  ____________________________________________  RADIOLOGY  Dg Chest 2 View  Result Date: 07/15/2018 CLINICAL DATA:  Cough and shortness of breath EXAM: CHEST - 2 VIEW COMPARISON:  07/11/2018 FINDINGS: Cardiac shadow is mildly enlarged but  stable. Aortic calcifications are again seen. Chronic fibrotic changes are noted stable from previous exam. The patchy acute infiltrate in the lateral aspect of the right upper lobe is again identified and stable. No new focal infiltrate or effusion is seen. Lungs remain hyperinflated. Aortic stent graft is noted. IMPRESSION: Acute on chronic infiltrate in the lateral aspect of the right upper lobe stable from the prior exam. No new focal abnormality is noted. Electronically Signed   By: Inez Catalina M.D.   On: 07/15/2018 16:16    ____________________________________________   PROCEDURES  Procedure(s) performed: None  Procedures  Critical Care performed: Yes ____________________________________________   INITIAL IMPRESSION / ASSESSMENT AND PLAN / ED COURSE  Pertinent labs & imaging results that were available during my care of the patient were reviewed by me and considered in my medical decision making (see chart for details).  85 y.o. male presenting with a several month general decline in his health, now with decreased exercise tolerance.  Overall, most of the patient symptoms appear to be chronic.  However, with ambulation, the patient's oxygen saturations declined to 83% and he became significantly symptomatic.  We will get a CT of the chest to rule out PE.  His chest x-ray does show a chronic unchanged right upper lobe infiltrate; an acute pneumonia is very unlikely.  Antibiotics are not indicated at this time.  Plan admission.  CRITICAL CARE Performed by: Eula Listen   Total critical care time: 35 minutes  Critical care time was exclusive of separately billable procedures and treating other patients.  Critical care was necessary to treat or prevent imminent or life-threatening deterioration.  Critical care was time spent personally by me on the following activities: development of treatment plan with patient and/or surrogate as well as nursing, discussions with  consultants, evaluation of patient's response to treatment, examination of patient, obtaining history from patient or surrogate, ordering and performing treatments and interventions, ordering and review of laboratory studies, ordering and review of radiographic studies, pulse oximetry and re-evaluation of patient's condition.   ____________________________________________  FINAL CLINICAL IMPRESSION(S) / ED DIAGNOSES  Final diagnoses:  Hypoxia  Decreased exercise tolerance  Intermittent confusion  Weight loss  Cough  Sore throat  Atrial fibrillation with RVR (HCC)         NEW MEDICATIONS STARTED DURING THIS VISIT:  New Prescriptions   No medications  on file      Eula Listen, MD 07/15/18 1732

## 2018-07-15 NOTE — ED Triage Notes (Signed)
Pt c/o having a cough with congestion for the past month, has been getting treatment at [facility] with a course of amoxicillin and now on day 4 of levaquin with no improvement and they are concerned about PNE. Pt is in NAD on arrival,. States he is coughing up clear sputum,.

## 2018-07-15 NOTE — H&P (Signed)
[facility] [place] NAME: Eamonn Sermeno    MR#:  [id number]  DATE OF BIRTH:  [birth year]  DATE OF ADMISSION:  07/15/2018  PRIMARY CARE PHYSICIAN: Jerrol Banana., MD   REQUESTING/REFERRING PHYSICIAN: Dr. Eula Listen  CHIEF COMPLAINT:   Chief Complaint  Patient presents with  . Shortness of Breath    HISTORY OF PRESENT ILLNESS:  Dashton Lough  is a 85 y.o. male with a known history of CAD status post PCI, congestive heart failure with systolic dysfunction, hypertension, mitral regurgitation, abdominal aortic aneurysm repair presents to hospital secondary to worsening weakness, cough and shortness of breath. Most of the history is obtained from his daughter at bedside.  Apparently patient has had worsening congestion, postnasal drip and sinus infection and was treated with amoxicillin at the end of December.  After that he started having cough and chest x-ray as outpatient revealed right lower lobe pneumonia and was treated with Levaquin.  However his symptoms of dyspnea on exertion, cough have not been improved.  Today when he went for a follow-up visit, he was noted to be hypoxic and so redirected to the emergency room.  Family denies any fevers or chills.  He complains of extreme fatigue and weakness.  Also sore throat and pain when coughing.  His ambulatory sats dropped down into the 80s here in the emergency room.  He does not use home oxygen.  CT angiogram is done and pending.  PAST MEDICAL HISTORY:   Past Medical History:  Diagnosis Date  . Abdominal aortic aneurysm ([place]) 01-2004   4.7 x 4.7 cm.  Marland Kitchen CAD (coronary artery disease) 01/25/2004   a. 01/2004 Ant MI/DES to LAD;  b. 10/2011 Neg MV.  . CHF (congestive heart failure) ([place])   . Chronic systolic heart failure ([place])    a. 08/2016 Echo: EF 35%, nl RV fxn, mod to sev MR, mild to mod TR, mod biatrial enlargement.  . Hyperlipidemia   . Hypertension   . Ischemic cardiomyopathy      a. 08/2016 Echo: EF 35%.  . Moderate to Severe Mitral regurgitation    a. 08/2016 Echo: Mod-sev MR.  Marland Kitchen Permanent atrial fibrillation    a. Chronic coumadin (CHA2DS2VASc = 5).  . Pulmonary nodules    Noted on abdominal CT  . Statin intolerance     PAST SURGICAL HISTORY:   Past Surgical History:  Procedure Laterality Date  . ABDOMINAL AORTIC ANEURYSM REPAIR     12/02/2007 [facility]  . ABDOMINAL AORTIC ANEURYSM REPAIR  2006   [facility]   . CARDIAC CATHETERIZATION  2009   [facility]  . CORONARY ANGIOPLASTY WITH STENT PLACEMENT  2005   Cypher stent LAD   . Cypher stents to LAD     01/25/2004  . SKIN SURGERY  2016   [facility] skin cancer    SOCIAL HISTORY:   Social History   Tobacco Use  . Smoking status: Former Smoker    Last attempt to quit: 12/01/1987    Years since quitting: 30.6  . Smokeless tobacco: Never Used  Substance Use Topics  . Alcohol use: No    FAMILY HISTORY:   Family History  Problem Relation Age of Onset  . Heart attack Father 84       Died w/ Heart attack  . Diabetes Father   . Heart attack Brother   . Heart disease Brother   . Diabetes Sister   . Heart disease Sister  DRUG ALLERGIES:   Allergies  Allergen Reactions  . Atorvastatin   . Simvastatin     REVIEW OF SYSTEMS:   Review of Systems  Constitutional: Positive for malaise/fatigue. Negative for chills, fever and weight loss.  HENT: Positive for sore throat. Negative for ear discharge, ear pain, hearing loss, nosebleeds and tinnitus.   Eyes: Negative for blurred vision, double vision and photophobia.  Respiratory: Positive for cough and shortness of breath. Negative for hemoptysis and wheezing.   Cardiovascular: Negative for chest pain, palpitations, orthopnea and leg swelling.  Gastrointestinal: Negative for abdominal pain, constipation, diarrhea, heartburn, melena, nausea and vomiting.  Genitourinary: Negative for dysuria, frequency and urgency.  Musculoskeletal: Positive for myalgias.  Negative for back pain, falls and neck pain.  Skin: Negative for rash.  Neurological: Negative for dizziness, tingling, sensory change, speech change, focal weakness and headaches.  Endo/Heme/Allergies: Does not bruise/bleed easily.  Psychiatric/Behavioral: Negative for depression.    MEDICATIONS AT HOME:   Prior to Admission medications   Medication Sig Start Date End Date Taking? Authorizing Provider  albuterol (PROVENTIL HFA;VENTOLIN HFA) 108 (90 Base) MCG/ACT inhaler Inhale 2 puffs into the lungs every 6 (six) hours as needed for wheezing or shortness of breath. 11/29/17   Jerrol Banana., MD  Bioflavonoid Products (BIOFLEX PO) Take 1 tablet by mouth daily.     [provider]  digoxin (LANOXIN) 0.125 MG tablet Take 0.0625 mg by mouth daily.     [provider]  furosemide (LASIX) 20 MG tablet Take 40 mg by mouth daily.    [provider]  HYDROcodone-homatropine (HYCODAN) 5-1.5 MG/5ML syrup Take 5 mLs by mouth at bedtime as needed for cough. 07/11/18   Trinna Post, PA-C  levofloxacin (LEVAQUIN) 500 MG tablet Take 1 tablet (500 mg total) by mouth daily. 07/12/18   Trinna Post, PA-C  lisinopril (PRINIVIL,ZESTRIL) 2.5 MG tablet Take 2.5 mg by mouth daily.    [provider]  metoprolol succinate (TOPROL-XL) 25 MG 24 hr tablet TAKE ONE-HALF TABLET BY  MOUTH DAILY 04/08/18   Deboraha Sprang, MD  omeprazole (PRILOSEC) 20 MG capsule TAKE 1 CAPSULE BY MOUTH  DAILY 02/16/18   Jerrol Banana., MD  oseltamivir (TAMIFLU) 75 MG capsule Take 1 capsule (75 mg total) by mouth daily for 10 days. 07/11/18 07/21/18  Trinna Post, PA-C  potassium chloride 20 MEQ TBCR Take 20 mEq by mouth daily. 12/06/17 04/22/18  Henreitta Leber, MD  sacubitril-valsartan (ENTRESTO) 24-26 MG Take 1 tablet by mouth 2 (two) times daily. 06/20/18   Deboraha Sprang, MD  warfarin (COUMADIN) 2.5 MG tablet Take 1 tablet (2.5 mg total) by mouth as directed. Patient taking  differently: Take  tablet (1.25MG ) by mouth Monday, Wednesday, Friday and take 1 tablet (2.5MG ) by mouth Tuesday, Thursday, Saturday and Sunday 10/03/17   Jerrol Banana., MD      VITAL SIGNS:  Blood pressure (!) 109/93, pulse 96, temperature 98.7 F (37.1 C), temperature source Oral, resp. rate 20, height 6\' 1"  (1.854 m), weight 84.4 kg, SpO2 98 %.  PHYSICAL EXAMINATION:   Physical Exam  GENERAL:  85 y.o.-year-old elderly patient lying in the bed with no acute distress.  EYES: Pupils equal, round, reactive to light and accommodation. No scleral icterus. Extraocular muscles intact.  HEENT: Head atraumatic, normocephalic. Oropharynx and nasopharynx clear.  NECK:  Supple, no jugular venous distention. No thyroid enlargement, no tenderness.  LUNGS: Normal breath sounds bilaterally except decreased at right  base. no wheezing, rales,rhonchi or crepitation. No use of accessory muscles of respiration.  CARDIOVASCULAR: S1, S2 rapid and irregular. No  rubs, or gallops. 2/6 systolic murmur present ABDOMEN: Soft, nontender, nondistended. Bowel sounds present. No organomegaly or mass.  EXTREMITIES: No pedal edema, cyanosis, or clubbing.  NEUROLOGIC: Cranial nerves II through XII are intact. Muscle strength 5/5 in all extremities. Sensation intact. Gait not checked. Global weakness noted. PSYCHIATRIC: The patient is alert and oriented x 3.  SKIN: No obvious rash, lesion, or ulcer.   LABORATORY PANEL:   CBC Recent Labs  Lab 07/15/18 1524  WBC 7.4  HGB 14.1  HCT 43.9  PLT 193   ------------------------------------------------------------------------------------------------------------------  Chemistries  Recent Labs  Lab 07/15/18 1524  NA 137  K 4.2  CL 105  CO2 25  GLUCOSE 118*  BUN 24*  CREATININE 1.17  CALCIUM 9.0  AST 20  ALT 13  ALKPHOS 78  BILITOT 0.8    ------------------------------------------------------------------------------------------------------------------  Cardiac Enzymes Recent Labs  Lab 07/15/18 1524  TROPONINI <0.03   ------------------------------------------------------------------------------------------------------------------  RADIOLOGY:  Dg Chest 2 View  Result Date: 07/15/2018 CLINICAL DATA:  Cough and shortness of breath EXAM: CHEST - 2 VIEW COMPARISON:  07/11/2018 FINDINGS: Cardiac shadow is mildly enlarged but stable. Aortic calcifications are again seen. Chronic fibrotic changes are noted stable from previous exam. The patchy acute infiltrate in the lateral aspect of the right upper lobe is again identified and stable. No new focal infiltrate or effusion is seen. Lungs remain hyperinflated. Aortic stent graft is noted. IMPRESSION: Acute on chronic infiltrate in the lateral aspect of the right upper lobe stable from the prior exam. No new focal abnormality is noted. Electronically Signed   By: Inez Catalina M.D.   On: 07/15/2018 16:16   Ct Head Wo Contrast  Result Date: 07/15/2018 CLINICAL DATA:  Subacute neurological deficits EXAM: CT HEAD WITHOUT CONTRAST TECHNIQUE: Contiguous axial images were obtained from the base of the skull through the vertex without intravenous contrast. Sagittal and coronal MPR images reconstructed from axial data set. COMPARISON:  None FINDINGS: Brain: Generalized atrophy. Normal ventricular morphology. No midline shift or mass effect. Small vessel chronic ischemic changes of deep cerebral white matter. 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 No intracranial hemorrhage, mass lesion, or evidence of acute infarction. No extra-axial fluid collections. Vascular: Mild atherosclerotic calcification of internal carotid arteries bilaterally at skull base. Skull: Intact Sinuses/Orbits: Clear Other: N/A IMPRESSION: Atrophy with small vessel chronic ischemic changes of deep cerebral white matter. Probably  old RIGHT occipital infarct. No acute intracranial abnormalities. Electronically Signed   By: Lavonia Dana M.D.   On: 07/15/2018 18:09    EKG:   Orders placed or performed during the hospital encounter of 07/15/18  . ED EKG  . ED EKG    IMPRESSION AND PLAN:   Brighton Consalvo  is a 85 y.o. male with a known history of CAD status post PCI, congestive heart failure with systolic dysfunction, hypertension, mitral regurgitation, abdominal aortic aneurysm repair presents to hospital secondary to worsening weakness, cough and shortness of breath.  1.  Acute hypoxia-secondary to right upper lobe pneumonia.  Chest x-ray revealing new right upper lobe pneumonia, likely failing outpatient antibiotic treatment with Levaquin. -Check MRSA PCR, started on vancomycin and cefepime -CT angios has been ordered.  However patient on Coumadin -Supplemental oxygen as tolerated -Physical therapy consult and incentive spirometry. -Symptomatic treatment for his cough and laryngitis -Patient already on prophylactic dose of Tamiflu as his wife was recently diagnosed with  flu and treated in the hospital.  2.  Atrial fibrillation-rate controlled.  Continue digoxin.  On warfarin for anticoagulation.  Pharmacy to dose it  3.  Chronic systolic CHF-well compensated.  Continue home medications.  Patient on Entresto, Toprol and Lasix.  4.  CAD-stable.  Continue home medications  5.  DVT prophylaxis-patient already on warfarin.  Physical therapy consulted.  Patient is independent at baseline    All the records are reviewed and case discussed with ED provider. Management plans discussed with the patient, family and they are in agreement.  CODE STATUS: Full Code  TOTAL TIME TAKING CARE OF THIS PATIENT: 51 minutes.    Gladstone Lighter M.D on 07/15/2018 at 6:15 PM  Between 7am to 6pm - Pager - [phone]  After 6pm go to [email or web address] - password [email or web address] [facility]  Office   [phone]  CC: Primary care physician; Jerrol Banana., MD

## 2018-07-15 NOTE — ED Notes (Signed)
Verbal order for walking O2 sat from Dr Mariea Clonts. Resting sat was 96% on RA, pulse 80. Pt walked length of ED hallway, sat dropped to 84%, pulse 88. Pt was able to ambulate back to stretcher. Dr Mariea Clonts notified.

## 2018-07-16 ENCOUNTER — Other Ambulatory Visit: Payer: Self-pay

## 2018-07-16 LAB — CBC
HCT: 40 % (ref 39.0–52.0)
HEMOGLOBIN: 12.9 g/dL — AB (ref 13.0–17.0)
MCH: 29.9 pg (ref 26.0–34.0)
MCHC: 32.3 g/dL (ref 30.0–36.0)
MCV: 92.8 fL (ref 80.0–100.0)
Platelets: 159 10*3/uL (ref 150–400)
RBC: 4.31 MIL/uL (ref 4.22–5.81)
RDW: 13.5 % (ref 11.5–15.5)
WBC: 6 10*3/uL (ref 4.0–10.5)
nRBC: 0 % (ref 0.0–0.2)

## 2018-07-16 LAB — BASIC METABOLIC PANEL
Anion gap: 8 (ref 5–15)
BUN: 23 mg/dL (ref 8–23)
CO2: 23 mmol/L (ref 22–32)
Calcium: 8.7 mg/dL — ABNORMAL LOW (ref 8.9–10.3)
Chloride: 107 mmol/L (ref 98–111)
Creatinine, Ser: 1.1 mg/dL (ref 0.61–1.24)
GFR calc Af Amer: >60 mL/min (ref >60)
GFR calc non Af Amer: 59 mL/min — ABNORMAL LOW (ref >60)
Glucose, Bld: 102 mg/dL — ABNORMAL HIGH (ref 70–99)
Potassium: 3.8 mmol/L (ref 3.5–5.1)
Sodium: 138 mmol/L (ref 135–145)

## 2018-07-16 LAB — MRSA PCR SCREENING: MRSA by PCR: NEGATIVE

## 2018-07-16 LAB — TSH: TSH: 4.293 u[IU]/mL (ref 0.350–4.500)

## 2018-07-16 LAB — PROTIME-INR
INR: 2.35
Prothrombin Time: 25.4 seconds — ABNORMAL HIGH (ref 11.4–15.2)

## 2018-07-16 MED ORDER — CALCIUM GLUCONATE-NACL 1-0.675 GM/50ML-% IV SOLN
1.0000 g | Freq: Once | INTRAVENOUS | Status: AC
  Administered 2018-07-16: 23:00:00 1000 mg via INTRAVENOUS
  Filled 2018-07-16: qty 50

## 2018-07-16 MED ORDER — OSELTAMIVIR PHOSPHATE 75 MG PO CAPS: 75.0000 mg | ORAL_CAPSULE | Freq: Every day | ORAL | Status: DC

## 2018-07-16 MED ORDER — ONDANSETRON HCL 4 MG/2ML IJ SOLN: 4.0000 mg | Freq: Four times a day (QID) | INTRAMUSCULAR | Status: DC | PRN

## 2018-07-16 MED ORDER — ONDANSETRON HCL 4 MG PO TABS: 4.0000 mg | ORAL_TABLET | Freq: Four times a day (QID) | ORAL | Status: DC | PRN

## 2018-07-16 MED ORDER — PANTOPRAZOLE SODIUM 40 MG PO TBEC
40.0000 mg | DELAYED_RELEASE_TABLET | Freq: Every day | ORAL | Status: DC
  Administered 2018-07-16 – 2018-07-20 (×5): 40 mg via ORAL
  Filled 2018-07-16 (×5): qty 1

## 2018-07-16 MED ORDER — GUAIFENESIN ER 600 MG PO TB12
600.0000 mg | ORAL_TABLET | Freq: Two times a day (BID) | ORAL | Status: DC
  Administered 2018-07-16 – 2018-07-20 (×9): 600 mg via ORAL
  Filled 2018-07-16 (×10): qty 1

## 2018-07-16 MED ORDER — ACETAMINOPHEN 325 MG PO TABS: 650.0000 mg | ORAL_TABLET | Freq: Four times a day (QID) | ORAL | Status: DC | PRN

## 2018-07-16 MED ORDER — METOPROLOL SUCCINATE ER 25 MG PO TB24
12.5000 mg | ORAL_TABLET | Freq: Every day | ORAL | Status: DC
  Filled 2018-07-16: qty 0.5

## 2018-07-16 MED ORDER — ACETAMINOPHEN 650 MG RE SUPP: 650.0000 mg | Freq: Four times a day (QID) | RECTAL | Status: DC | PRN

## 2018-07-16 MED ORDER — WARFARIN SODIUM 2.5 MG PO TABS
2.5000 mg | ORAL_TABLET | Freq: Once | ORAL | Status: AC
  Administered 2018-07-16: 19:00:00 2.5 mg via ORAL
  Filled 2018-07-16 (×2): qty 1

## 2018-07-16 MED ORDER — SACUBITRIL-VALSARTAN 24-26 MG PO TABS: 1.0000 | ORAL_TABLET | Freq: Two times a day (BID) | ORAL | Status: DC

## 2018-07-16 MED ORDER — OSELTAMIVIR PHOSPHATE 30 MG PO CAPS
30.0000 mg | ORAL_CAPSULE | Freq: Every day | ORAL | Status: AC
  Administered 2018-07-16 – 2018-07-20 (×5): 30 mg via ORAL
  Filled 2018-07-16 (×8): qty 1

## 2018-07-16 MED ORDER — LISINOPRIL 5 MG PO TABS
2.5000 mg | ORAL_TABLET | Freq: Every day | ORAL | Status: DC
  Administered 2018-07-18: 2.5 mg via ORAL
  Filled 2018-07-16 (×4): qty 1

## 2018-07-16 MED ORDER — MAGNESIUM SULFATE 2 GM/50ML IV SOLN
2.0000 g | Freq: Once | INTRAVENOUS | Status: AC
  Administered 2018-07-17: 2 g via INTRAVENOUS
  Filled 2018-07-16: qty 50

## 2018-07-16 MED ORDER — FUROSEMIDE 40 MG PO TABS
40.0000 mg | ORAL_TABLET | Freq: Every day | ORAL | Status: DC
  Administered 2018-07-16 – 2018-07-20 (×4): 40 mg via ORAL
  Filled 2018-07-16 (×5): qty 1

## 2018-07-16 MED ORDER — MENTHOL 3 MG MT LOZG
1.0000 | LOZENGE | OROMUCOSAL | Status: DC | PRN
  Filled 2018-07-16: qty 9

## 2018-07-16 MED ORDER — ALBUTEROL SULFATE (2.5 MG/3ML) 0.083% IN NEBU: 2.5000 mg | INHALATION_SOLUTION | Freq: Four times a day (QID) | RESPIRATORY_TRACT | Status: DC | PRN

## 2018-07-16 MED ORDER — DIGOXIN 125 MCG PO TABS
0.0625 mg | ORAL_TABLET | Freq: Every day | ORAL | Status: DC
  Administered 2018-07-18 – 2018-07-20 (×3): 0.0625 mg via ORAL
  Filled 2018-07-16 (×5): qty 0.5

## 2018-07-16 MED ORDER — HYDROCOD POLST-CPM POLST ER 10-8 MG/5ML PO SUER
5.0000 mL | Freq: Two times a day (BID) | ORAL | Status: DC
  Administered 2018-07-16 – 2018-07-20 (×9): 5 mL via ORAL
  Filled 2018-07-16 (×9): qty 5

## 2018-07-16 NOTE — Evaluation (Signed)
Physical Therapy Evaluation Patient Details Name: Joseph Barton MRN: [id number] DOB: 03-08-1927 Today's Date: 07/16/2018   History of Present Illness  Pt is an 85 y.o. male presenting to hospital 07/15/18 with weakness, cough, and SOB.  Pt admitted with acute hypoxia secondary R UL PNA.  PMH includes htn, severe mitral regurgitation, ischemic cardiomyopathy, a-fib on Coumadin, AAA, CHF.  Clinical Impression  Prior to hospital admission, pt was independent with functional mobility.  Pt lives with his wife (who recently returned home from hospital) in 1 level home with 1 step to enter.  No h/o falls.  Currently pt is modified independent with bed mobility, SBA with transfers, and SBA to CGA ambulating 150 feet (no AD).  Pt demonstrating generalized weakness and mild SOB with ambulation.  O2 sats (all on room air) 99% at rest beginning of session; decreased to 90% post ambulation; and increased back to 98% with rest at end of session.  Pt educated on pursed lip breathing with activity.  Mild unsteadiness noted with ambulation but no overt loss of balance noted.  Pt would benefit from skilled PT to address noted impairments and functional limitations during hospital stay (see below for any additional details).  Upon hospital discharge, no further PT needs anticipated.    Follow Up Recommendations No PT follow up    Equipment Recommendations  None recommended by PT    Recommendations for Other Services       Precautions / Restrictions Precautions Precautions: Fall Restrictions Weight Bearing Restrictions: No      Mobility  Bed Mobility Overal bed mobility: Modified Independent             General bed mobility comments: Semi-supine to/from sit with HOB elevated without any noted difficulties.  Transfers Overall transfer level: Needs assistance Equipment used: None Transfers: Sit to/from Stand Sit to Stand: Supervision         General transfer comment: mild increased effort to  stand from bed and quicker sit onto bed noted  Ambulation/Gait Ambulation/Gait assistance: Supervision;Min guard Gait Distance (Feet): 150 Feet Assistive device: None   Gait velocity: mildly decreased   General Gait Details: mildly unsteady but no overt loss of balance noted; mostly step through gait pattern  Stairs            Wheelchair Mobility    Modified Rankin (Stroke Patients Only)       Balance Overall balance assessment: Needs assistance Sitting-balance support: No upper extremity supported;Feet supported Sitting balance-Leahy Scale: Normal Sitting balance - Comments: steady sitting reaching outside BOS   Standing balance support: During functional activity Standing balance-Leahy Scale: Good Standing balance comment: no overt loss of balance with ambulation in room                             Pertinent Vitals/Pain Pain Assessment: No/denies pain  HR WFL during session's activities.    Home Living Family/patient expects to be discharged to:: Private residence Living Arrangements: Spouse/significant other Available Help at Discharge: Family Type of Home: House Home Access: Stairs to enter Entrance Stairs-Rails: None Entrance Stairs-Number of Steps: 1 Home Layout: One level Home Equipment: None      Prior Function Level of Independence: Independent         Comments: No falls in past 6 months.  Wife recently discharged home from hospital.     Hand Dominance        Extremity/Trunk Assessment   Upper Extremity Assessment Upper  Extremity Assessment: Generalized weakness    Lower Extremity Assessment Lower Extremity Assessment: Generalized weakness    Cervical / Trunk Assessment Cervical / Trunk Assessment: Normal  Communication   Communication: HOH  Cognition Arousal/Alertness: Awake/alert Behavior During Therapy: WFL for tasks assessed/performed Overall Cognitive Status: Within Functional Limits for tasks assessed                                         General Comments  Pt agreeable to PT session.  Pt's daughter present end of session.    Exercises  Educated pt on pursed lip breathing with activity.   Assessment/Plan    PT Assessment Patient needs continued PT services  PT Problem List Decreased strength;Decreased activity tolerance;Decreased balance;Decreased mobility;Cardiopulmonary status limiting activity       PT Treatment Interventions DME instruction;Gait training;Stair training;Functional mobility training;Therapeutic activities;Therapeutic exercise;Balance training;Patient/family education    PT Goals (Current goals can be found in the Care Plan section)  Acute Rehab PT Goals Patient Stated Goal: to improve breathing PT Goal Formulation: With patient Time For Goal Achievement: 07/30/18 Potential to Achieve Goals: Good    Frequency Min 2X/week   Barriers to discharge        Co-evaluation               AM-PAC PT "6 Clicks" Mobility  Outcome Measure Help needed turning from your back to your side while in a flat bed without using bedrails?: None Help needed moving from lying on your back to sitting on the side of a flat bed without using bedrails?: None Help needed moving to and from a bed to a chair (including a wheelchair)?: A Little Help needed standing up from a chair using your arms (e.g., wheelchair or bedside chair)?: A Little Help needed to walk in hospital room?: A Little Help needed climbing 3-5 steps with a railing? : A Little 6 Click Score: 20    End of Session Equipment Utilized During Treatment: Gait belt Activity Tolerance: Patient tolerated treatment well Patient left: in bed;with call bell/phone within reach;with bed alarm set;with family/visitor present   PT Visit Diagnosis: Muscle weakness (generalized) (M62.81);Unsteadiness on feet (R26.81)    Time: 7341-9379 PT Time Calculation (min) (ACUTE ONLY): 18 min   Charges:   PT  Evaluation $PT Eval Low Complexity: 1 Low         Jarriel Papillion, PT 07/16/18, 3:06 PM [phone]

## 2018-07-16 NOTE — Progress Notes (Signed)
[facility] NAME: Joseph Barton    MR#:  [id number]  DATE OF BIRTH:  April 03, 1927  SUBJECTIVE:  CHIEF COMPLAINT:   Chief Complaint  Patient presents with  . Shortness of Breath  Patient seen and evaluated today Decreased shortness of breath Cough is still present No fever Has generalized weakness  REVIEW OF SYSTEMS:    ROS  CONSTITUTIONAL: No documented fever. Has fatigue, weakness. No weight gain, no weight loss.  EYES: No blurry or double vision.  ENT: No tinnitus. No postnasal drip. No redness of the oropharynx.  RESPIRATORY: Has cough, no wheeze, no hemoptysis. Decreased dyspnea.  CARDIOVASCULAR: No chest pain. No orthopnea. No palpitations. No syncope.  GASTROINTESTINAL: No nausea, no vomiting or diarrhea. No abdominal pain. No melena or hematochezia.  GENITOURINARY: No dysuria or hematuria.  ENDOCRINE: No polyuria or nocturia. No heat or cold intolerance.  HEMATOLOGY: No anemia. No bruising. No bleeding.  INTEGUMENTARY: No rashes. No lesions.  MUSCULOSKELETAL: No arthritis. No swelling. No gout.  NEUROLOGIC: No numbness, tingling, or ataxia. No seizure-type activity.  PSYCHIATRIC: No anxiety. No insomnia. No ADD.   DRUG ALLERGIES:   Allergies  Allergen Reactions  . Atorvastatin   . Simvastatin     VITALS:  Blood pressure 109/63, pulse 64, temperature 98.8 F (37.1 C), resp. rate 16, height 6\' 1"  (1.854 m), weight 84.4 kg, SpO2 99 %.  PHYSICAL EXAMINATION:   Physical Exam  GENERAL:  85 y.o.-year-old patient lying in the bed with no acute distress.  EYES: Pupils equal, round, reactive to light and accommodation. No scleral icterus. Extraocular muscles intact.  HEENT: Head atraumatic, normocephalic. Oropharynx and nasopharynx clear.  NECK:  Supple, no jugular venous distention. No thyroid enlargement, no tenderness.  LUNGS: Improved breath sounds bilaterally, rales heard in both lungs. No use of accessory muscles  of respiration.  CARDIOVASCULAR: S1, S2 normal. No murmurs, rubs, or gallops.  ABDOMEN: Soft, nontender, nondistended. Bowel sounds present. No organomegaly or mass.  EXTREMITIES: No cyanosis, clubbing or edema b/l.    NEUROLOGIC: Cranial nerves II through XII are intact. No focal Motor or sensory deficits b/l.   PSYCHIATRIC: The patient is alert and oriented x 3.  SKIN: No obvious rash, lesion, or ulcer.   LABORATORY PANEL:   CBC Recent Labs  Lab 07/16/18 0505  WBC 6.0  HGB 12.9*  HCT 40.0  PLT 159   ------------------------------------------------------------------------------------------------------------------ Chemistries  Recent Labs  Lab 07/15/18 1524 07/16/18 0505  NA 137 138  K 4.2 3.8  CL 105 107  CO2 25 23  GLUCOSE 118* 102*  BUN 24* 23  CREATININE 1.17 1.10  CALCIUM 9.0 8.7*  AST 20  --   ALT 13  --   ALKPHOS 78  --   BILITOT 0.8  --    ------------------------------------------------------------------------------------------------------------------  Cardiac Enzymes Recent Labs  Lab 07/15/18 1524  TROPONINI <0.03   ------------------------------------------------------------------------------------------------------------------  RADIOLOGY:  Dg Chest 2 View  Result Date: 07/15/2018 CLINICAL DATA:  Cough and shortness of breath EXAM: CHEST - 2 VIEW COMPARISON:  07/11/2018 FINDINGS: Cardiac shadow is mildly enlarged but stable. Aortic calcifications are again seen. Chronic fibrotic changes are noted stable from previous exam. The patchy acute infiltrate in the lateral aspect of the right upper lobe is again identified and stable. No new focal infiltrate or effusion is seen. Lungs remain hyperinflated. Aortic stent graft is noted. IMPRESSION: Acute on chronic infiltrate in the lateral aspect of the right upper lobe stable from  the prior exam. No new focal abnormality is noted. Electronically Signed   By: Inez Catalina M.D.   On: 07/15/2018 16:16   Ct Head Wo  Contrast  Result Date: 07/15/2018 CLINICAL DATA:  Subacute neurological deficits EXAM: CT HEAD WITHOUT CONTRAST TECHNIQUE: Contiguous axial images were obtained from the base of the skull through the vertex without intravenous contrast. Sagittal and coronal MPR images reconstructed from axial data set. COMPARISON:  None FINDINGS: Brain: Generalized atrophy. Normal ventricular morphology. No midline shift or mass effect. Small vessel chronic ischemic changes of deep cerebral white matter. 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 No intracranial hemorrhage, mass lesion, or evidence of acute infarction. No extra-axial fluid collections. Vascular: Mild atherosclerotic calcification of internal carotid arteries bilaterally at skull base. Skull: Intact Sinuses/Orbits: Clear Other: N/A IMPRESSION: Atrophy with small vessel chronic ischemic changes of deep cerebral white matter. 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 No acute intracranial abnormalities. Electronically Signed   By: Lavonia Dana M.D.   On: 07/15/2018 18:09   Ct Angio Chest Pe W And/or Wo Contrast  Result Date: 07/15/2018 CLINICAL DATA:  85 year old male with cough and congestion not improving with antibiotics for the past 4 days. EXAM: CT ANGIOGRAPHY CHEST WITH CONTRAST TECHNIQUE: Multidetector CT imaging of the chest was performed using the standard protocol during bolus administration of intravenous contrast. Multiplanar CT image reconstructions and MIPs were obtained to evaluate the vascular anatomy. CONTRAST:  22mL OMNIPAQUE IOHEXOL 350 MG/ML SOLN COMPARISON:  Chest CT 11/14/2006 FINDINGS: Cardiovascular: Good contrast bolus timing in the pulmonary arterial tree. Mild respiratory motion. No focal filling defect identified in the pulmonary arteries to suggest acute pulmonary embolism. Calcified aortic atherosclerosis. Calcified coronary artery atherosclerosis and/or stent. Partially visible abdominal aortic endograft. Ectatic descending thoracic aorta. No  contrast in the aorta on this study. Mild cardiomegaly. No pericardial effusion. Mediastinum/Nodes: Negative. No lymphadenopathy. Lungs/Pleura: Centrilobular and paraseptal emphysema. Progressed subpleural reticular opacity in both lungs resembling early honeycombing in areas. Confluent new distal peribronchial opacity in the right upper lobe on series 6, image 20 compared to 2008, and chest radiographs in 2019. No pleural effusion. Stable right lung subpleural nodule since 2008 on series 6, image 67 (benign). Major airways are patent. Upper Abdomen: Negative visible liver, gallbladder, spleen, pancreas, adrenal glands and bowel in the upper abdomen. Partially visible abdominal aortic endograft. Enlarged but simple fluid density left renal upper pole cyst since the 2008 CT (series 4, image 112. There is probably a small right renal midpole cyst partially visible. Musculoskeletal: Chronic appearing T12 inferior endplate compression fracture versus Schmorl's node. Osteopenia. No acute osseous abnormality identified. Review of the MIP images confirms the above findings. IMPRESSION: 1. Negative for acute pulmonary embolus. 2. Patchy right upper lobe opacity compatible with Bronchopneumonia. No pleural effusion. Recommend repeat noncontrast Chest CT in 3 months to evaluate persistence versus resolution. This recommendation follows the consensus statement: Recommendations for the Management of Subsolid Pulmonary Nodules Detected at CT: A Statement from the [employer] as published in Radiology 2013; 266:304-317. 3. Progressed chronic lung disease since 2008 with Emphysema (ICD10-J43.9). Electronically Signed   By: Genevie Ann M.D.   On: 07/15/2018 18:22     ASSESSMENT AND PLAN:  85 year old male patient with history of coronary artery disease, PCI, congestive heart failure with systolic dysfunction, abdominal aortic aneurysm repair, hypertension, mitral regurgitation under hospitalist service for treatment of  pneumonia  -Acute hypoxic respiratory distress secondary to pneumonia Continue oxygen via nasal cannula Wean oxygen to room air  -Right lung  pneumonia Failed outpatient antibiotic therapy with Levaquin Continue IV vancomycin and cefepime antibiotics Follow-up cultures Flu test has been negative  -Chronic systolic heart failure Well compensated Continue Toprol, Lasix and Entresto  -DVT prophylaxis Continue warfarin INR therapeutic  -Coronary artery disease Medical management to continue   All the records are reviewed and case discussed with Care Management/Social Worker. Management plans discussed with the patient, family and they are in agreement.  CODE STATUS: DNR  DVT Prophylaxis: SCDs  TOTAL TIME TAKING CARE OF THIS PATIENT: 35 minutes.   POSSIBLE D/C IN 1 to 2 DAYS, DEPENDING ON CLINICAL CONDITION.  Saundra Shelling M.D on 07/16/2018 at 11:41 AM  Between 7am to 6pm - Pager - [phone]  After 6pm go to [email or web address] - password [email or web address] [facility]  Office  [phone]  CC: Primary care physician; Jerrol Banana., MD  Note: This dictation was prepared with Dragon dictation along with smaller phrase technology. Any transcriptional errors that result from this process are unintentional.

## 2018-07-16 NOTE — ED Notes (Signed)
Breakfast tray given at this time.  

## 2018-07-16 NOTE — Progress Notes (Signed)
Advanced care plan.  Purpose of the Encounter: CODE STATUS  Parties in Attendance: Patient and family  Patient's Decision Capacity: Good  Subjective/Patient's story: Presented to the emergency room for shortness of breath and cough   Objective/Medical story Patient has hypoxia secondary to pneumonia Needs IV antibiotics for the pneumonia Patient failed outpatient antibiotic therapy with Levaquin   Goals of care determination:  Advance care directives goals of care and treatment plan discussed Patient does not want CPR, intubation ventilator if the need arises   CODE STATUS: DNR   Time spent discussing advanced care planning: 16 minutes

## 2018-07-16 NOTE — Consult Note (Addendum)
ANTICOAGULATION CONSULT NOTE - Follow Up Consult  Pharmacy Consult for Warfarin dosing Indication: atrial fibrillation  Allergies  Allergen Reactions  . Atorvastatin   . Simvastatin     Patient Measurements: Height: 6\' 1"  (185.4 cm) Weight: 186 lb (84.4 kg) IBW/kg (Calculated) : 79.9   Vital Signs: Temp: 98.8 F (37.1 C) (01/14 0424) Temp Source: Oral (01/13 2239) BP: 106/57 (01/14 0424) Pulse Rate: 52 (01/14 0715)  Labs: Recent Labs    07/15/18 1524 07/15/18 1530 07/16/18 0505  HGB 14.1  --  12.9*  HCT 43.9  --  40.0  PLT 193  --  159  LABPROT  --  26.1* 25.4*  INR  --  2.43 2.35  CREATININE 1.17  --  1.10  TROPONINI <0.03  --   --     Estimated Creatinine Clearance: 51.5 mL/min (by C-G formula based on SCr of 1.1 mg/dL).   Medications:  (Not in a hospital admission)   Assessment: 85 y.o. male with a known history of CAD status post PCI, congestive heart failure with systolic dysfunction, hypertension, mitral regurgitation, abdominal aortic aneurysm repair presents to hospital secondary to worsening weakness, cough and shortness of breath.  Home dose: Per chart review patient followed by anticoag clinic with recent encounter notated to have dose of: Warfarin 2.5 mg every Sun,Tues,Thurs,Sat Warfarin 1.25 mg every MWF  1/13 INR 2.43 (baseline), Dosed 1.25mg  (not given or failed to document) 1/14 INR 2.35, Dosed 2.5mg     Goal of Therapy:  INR 2-3 Monitor platelets by anticoagulation protocol: Yes   Plan:  Current INR 2.35 Will continue current home regimen and order warfarin 2.5 mg once. Will drawn INR and CBC daily while patient is on antibiotics.  Lu Duffel, PharmD, BCPS Clinical Pharmacist 07/16/2018 7:46 AM

## 2018-07-16 NOTE — ED Notes (Signed)
Family at bedside. 

## 2018-07-16 NOTE — Progress Notes (Signed)
PHARMACY NOTE:  ANTIMICROBIAL RENAL DOSAGE ADJUSTMENT  Current antimicrobial regimen includes a mismatch between antimicrobial dosage and estimated renal function.  As per policy approved by the Pharmacy & Therapeutics and Medical Executive Committees, the antimicrobial dosage will be adjusted accordingly.  Current antimicrobial dosage:  Oseltamivir 75 po BID  Indication: Flu  Renal Function:  Estimated Creatinine Clearance: 51.5 mL/min (by C-G formula based on SCr of 1.1 mg/dL).     Antimicrobial dosage has been changed to:  Oseltamivir 30 mg po BID  Additional comments:   Thank you for allowing pharmacy to be a part of this patient's care.  Michele Judy A, [facility] 07/16/2018 7:39 AM

## 2018-07-16 NOTE — ED Notes (Signed)
ED TO INPATIENT HANDOFF REPORT  Name/Age/Gender Joseph Barton 85 y.o. male  Code Status    Code Status Orders  (From admission, onward)         Start     Ordered   07/16/18 1140  Do not attempt resuscitation (DNR)  Continuous    Question Answer Comment  In the event of cardiac or respiratory ARREST Do not call a "code blue"   In the event of cardiac or respiratory ARREST Do not perform Intubation, CPR, defibrillation or ACLS   In the event of cardiac or respiratory ARREST Use medication by any route, position, wound care, and other measures to relive pain and suffering. May use oxygen, suction and manual treatment of airway obstruction as needed for comfort.      07/16/18 1139        Code Status History    Date Active Date Inactive Code Status Order ID Comments User Context   07/16/2018 0451 07/16/2018 1139 Full Code [id number]  Gladstone Lighter, MD ED   12/04/2017 1517 12/06/2017 1916 Partial Code [id number]  Salary, Avel Peace, MD Inpatient    Advance Directive Documentation     Most Recent Value  Type of Advance Directive  Healthcare Power of Attorney, Living will  Pre-existing out of facility DNR order (yellow form or pink MOST form)  -  "MOST" Form in Place?  -      Home/SNF/Other Home  Chief Complaint pneumonia  Level of Care/Admitting Diagnosis ED Disposition    ED Disposition Condition [place]: [facility] [[id number]]  Level of Care: Med-Surg [16]  Diagnosis: Pneumonia [[id number]]  Admitting Physician: Gladstone Lighter [[id number]]  Attending Physician: Gladstone Lighter [[id number]]  Estimated length of stay: past midnight tomorrow  Certification:: I certify this patient will need inpatient services for at least 2 midnights  PT Class (Do Not Modify): Inpatient [101]  PT Acc Code (Do Not Modify): Private [1]       Medical History Past Medical History:  Diagnosis Date  . Abdominal aortic aneurysm ([place]) 01-2004   4.7 x 4.7  cm.  Marland Kitchen CAD (coronary artery disease) 01/25/2004   a. 01/2004 Ant MI/DES to LAD;  b. 10/2011 Neg MV.  . CHF (congestive heart failure) ([place])   . Chronic systolic heart failure ([place])    a. 08/2016 Echo: EF 35%, nl RV fxn, mod to sev MR, mild to mod TR, mod biatrial enlargement.  . Hyperlipidemia   . Hypertension   . Ischemic cardiomyopathy    a. 08/2016 Echo: EF 35%.  . Moderate to Severe Mitral regurgitation    a. 08/2016 Echo: Mod-sev MR.  Marland Kitchen Permanent atrial fibrillation    a. Chronic coumadin (CHA2DS2VASc = 5).  . Pulmonary nodules    Noted on abdominal CT  . Statin intolerance     Allergies Allergies  Allergen Reactions  . Atorvastatin   . Simvastatin     IV Location/Drains/Wounds Patient Lines/Drains/Airways Status   Active Line/Drains/Airways    Name:   Placement date:   Placement time:   Site:   Days:   Peripheral IV 07/15/18 Left Forearm   07/15/18    1741    Forearm   1          Labs/Imaging Results for orders placed or performed during the hospital encounter of 07/15/18 (from the past 48 hour(s))  CBC with Differential     Status: None   Collection Time: 07/15/18  3:24 PM  Result  Value Ref Range   WBC 7.4 4.0 - 10.5 K/uL   RBC 4.70 4.22 - 5.81 MIL/uL   Hemoglobin 14.1 13.0 - 17.0 g/dL   HCT 43.9 39.0 - 52.0 %   MCV 93.4 80.0 - 100.0 fL   MCH 30.0 26.0 - 34.0 pg   MCHC 32.1 30.0 - 36.0 g/dL   RDW 13.4 11.5 - 15.5 %   Platelets 193 150 - 400 K/uL   nRBC 0.0 0.0 - 0.2 %   Neutrophils Relative % 66 %   Neutro Abs 4.9 1.7 - 7.7 K/uL   Lymphocytes Relative 20 %   Lymphs Abs 1.5 0.7 - 4.0 K/uL   Monocytes Relative 11 %   Monocytes Absolute 0.8 0.1 - 1.0 K/uL   Eosinophils Relative 1 %   Eosinophils Absolute 0.1 0.0 - 0.5 K/uL   Basophils Relative 1 %   Basophils Absolute 0.1 0.0 - 0.1 K/uL   Immature Granulocytes 1 %   Abs Immature Granulocytes 0.04 0.00 - 0.07 K/uL    Comment: Performed at [facility] [address] [place], [place] [address]   Comprehensive metabolic panel     Status: Abnormal   Collection Time: 07/15/18  3:24 PM  Result Value Ref Range   Sodium 137 135 - 145 mmol/L   Potassium 4.2 3.5 - 5.1 mmol/L   Chloride 105 98 - 111 mmol/L   CO2 25 22 - 32 mmol/L   Glucose, Bld 118 (H) 70 - 99 mg/dL   BUN 24 (H) 8 - 23 mg/dL   Creatinine, Ser 1.17 0.61 - 1.24 mg/dL   Calcium 9.0 8.9 - 10.3 mg/dL   Total Protein 7.7 6.5 - 8.1 g/dL   Albumin 3.6 3.5 - 5.0 g/dL   AST 20 15 - 41 U/L   ALT 13 0 - 44 U/L   Alkaline Phosphatase 78 38 - 126 U/L   Total Bilirubin 0.8 0.3 - 1.2 mg/dL   GFR calc non Af Amer 55 (L) >60 mL/min   GFR calc Af Amer >60 >60 mL/min   Anion gap 7 5 - 15    Comment: Performed at [facility], [address] [place], [place] [address]  Troponin I - ONCE - STAT     Status: None   Collection Time: 07/15/18  3:24 PM  Result Value Ref Range   Troponin I <0.03 <0.03 ng/mL    Comment: Performed at [facility], [address] [place], [place] [address]  Brain natriuretic peptide     Status: Abnormal   Collection Time: 07/15/18  3:30 PM  Result Value Ref Range   B Natriuretic Peptide 612.0 (H) 0.0 - 100.0 pg/mL    Comment: Performed at [facility], [address] [place],  [address]  Protime-INR     Status: Abnormal   Collection Time: 07/15/18  3:30 PM  Result Value Ref Range   Prothrombin Time 26.1 (H) 11.4 - 15.2 seconds   INR 2.43     Comment: Performed at [facility], [place],  [address]  Procalcitonin - Baseline     Status: None   Collection Time: 07/15/18  3:30 PM  Result Value Ref Range   Procalcitonin <0.10 ng/mL    Comment:        Interpretation: PCT (Procalcitonin) <= 0.5 ng/mL: Systemic infection (sepsis) is not likely. Local bacterial infection is possible. (NOTE)       Sepsis PCT Algorithm  Lower Respiratory Tract                                      Infection PCT Algorithm     ----------------------------     ----------------------------         PCT < 0.25 ng/mL                PCT < 0.10 ng/mL         Strongly encourage             Strongly discourage   discontinuation of antibiotics    initiation of antibiotics    ----------------------------     -----------------------------       PCT 0.25 - 0.50 ng/mL            PCT 0.10 - 0.25 ng/mL               OR       >80% decrease in PCT            Discourage initiation of                                            antibiotics      Encourage discontinuation           of antibiotics    ----------------------------     -----------------------------         PCT >= 0.50 ng/mL              PCT 0.26 - 0.50 ng/mL               AND        <80% decrease in PCT             Encourage initiation of                                             antibiotics       Encourage continuation           of antibiotics    ----------------------------     -----------------------------        PCT >= 0.50 ng/mL                  PCT > 0.50 ng/mL               AND         increase in PCT                  Strongly encourage                                      initiation of antibiotics    Strongly encourage escalation           of antibiotics                                     -----------------------------  PCT <= 0.25 ng/mL                                                 OR                                        > 80% decrease in PCT                                     Discontinue / Do not initiate                                             antibiotics Performed at [facility], [address] [place], [place] [address]   Group A Strep by PCR [facility] Only)     Status: None   Collection Time: 07/15/18  6:24 PM  Result Value Ref Range   Group A Strep by PCR NOT DETECTED NOT DETECTED    Comment: Performed at [facility], [address] [place], [place] [address]  Urinalysis,  Complete w Microscopic     Status: Abnormal   Collection Time: 07/15/18  8:21 PM  Result Value Ref Range   Color, Urine YELLOW (A) YELLOW   APPearance CLEAR (A) CLEAR   Specific Gravity, Urine >1.046 (H) 1.005 - 1.030   pH 5.0 5.0 - 8.0   Glucose, UA NEGATIVE NEGATIVE mg/dL   Hgb urine dipstick NEGATIVE NEGATIVE   Bilirubin Urine NEGATIVE NEGATIVE   Ketones, ur 5 (A) NEGATIVE mg/dL   Protein, ur NEGATIVE NEGATIVE mg/dL   Nitrite NEGATIVE NEGATIVE   Leukocytes, UA NEGATIVE NEGATIVE   RBC / HPF 0-5 0 - 5 RBC/hpf   WBC, UA 0-5 0 - 5 WBC/hpf   Bacteria, UA NONE SEEN NONE SEEN   Squamous Epithelial / LPF 0-5 0 - 5   Mucus PRESENT     Comment: Performed at [facility], [address] [place], [place] [address]  Protime-INR     Status: Abnormal   Collection Time: 07/16/18  5:05 AM  Result Value Ref Range   Prothrombin Time 25.4 (H) 11.4 - 15.2 seconds   INR 2.35     Comment: Performed at [facility], [address] [place], [place] [address]  CBC     Status: Abnormal   Collection Time: 07/16/18  5:05 AM  Result Value Ref Range   WBC 6.0 4.0 - 10.5 K/uL   RBC 4.31 4.22 - 5.81 MIL/uL   Hemoglobin 12.9 (L) 13.0 - 17.0 g/dL   HCT 40.0 39.0 - 52.0 %   MCV 92.8 80.0 - 100.0 fL   MCH 29.9 26.0 - 34.0 pg   MCHC 32.3 30.0 - 36.0 g/dL   RDW 13.5 11.5 - 15.5 %   Platelets 159 150 - 400 K/uL   nRBC 0.0 0.0 - 0.2 %    Comment: Performed at [facility], [address] [place], [place] [address]  MRSA PCR Screening     Status: None   Collection Time: 07/16/18  5:05 AM  Result Value  Ref Range   MRSA by PCR NEGATIVE NEGATIVE    Comment:        The GeneXpert MRSA Assay (FDA approved for NASAL specimens only), is one component of a comprehensive MRSA colonization surveillance program. It is not intended to diagnose MRSA infection nor to guide or monitor treatment for MRSA infections. Performed at [facility], [address] [place],  [place] [address]   Basic metabolic panel     Status: Abnormal   Collection Time: 07/16/18  5:05 AM  Result Value Ref Range   Sodium 138 135 - 145 mmol/L   Potassium 3.8 3.5 - 5.1 mmol/L   Chloride 107 98 - 111 mmol/L   CO2 23 22 - 32 mmol/L   Glucose, Bld 102 (H) 70 - 99 mg/dL   BUN 23 8 - 23 mg/dL   Creatinine, Ser 1.10 0.61 - 1.24 mg/dL   Calcium 8.7 (L) 8.9 - 10.3 mg/dL   GFR calc non Af Amer 59 (L) >60 mL/min   GFR calc Af Amer >60 >60 mL/min   Anion gap 8 5 - 15    Comment: Performed at [facility], [address] [place], [place] [address]  TSH     Status: None   Collection Time: 07/16/18  5:05 AM  Result Value Ref Range   TSH 4.293 0.350 - 4.500 uIU/mL    Comment: Performed by a 3rd Generation assay with a functional sensitivity of <=0.01 uIU/mL. Performed at [facility], [address] [place], [place] [address]    Dg Chest 2 View  Result Date: 07/15/2018 CLINICAL DATA:  Cough and shortness of breath EXAM: CHEST - 2 VIEW COMPARISON:  07/11/2018 FINDINGS: Cardiac shadow is mildly enlarged but stable. Aortic calcifications are again seen. Chronic fibrotic changes are noted stable from previous exam. The patchy acute infiltrate in the lateral aspect of the right upper lobe is again identified and stable. No new focal infiltrate or effusion is seen. Lungs remain hyperinflated. Aortic stent graft is noted. IMPRESSION: Acute on chronic infiltrate in the lateral aspect of the right upper lobe stable from the prior exam. No new focal abnormality is noted. Electronically Signed   By: Inez Catalina M.D.   On: 07/15/2018 16:16   Ct Head Wo Contrast  Result Date: 07/15/2018 CLINICAL DATA:  Subacute neurological deficits EXAM: CT HEAD WITHOUT CONTRAST TECHNIQUE: Contiguous axial images were obtained from the base of the skull through the vertex without intravenous contrast. Sagittal and coronal MPR images reconstructed from axial data set. COMPARISON:  None FINDINGS: Brain:  Generalized atrophy. Normal ventricular morphology. No midline shift or mass effect. Small vessel chronic ischemic changes of deep cerebral white matter. 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 No intracranial hemorrhage, mass lesion, or evidence of acute infarction. No extra-axial fluid collections. Vascular: Mild atherosclerotic calcification of internal carotid arteries bilaterally at skull base. Skull: Intact Sinuses/Orbits: Clear Other: N/A IMPRESSION: Atrophy with small vessel chronic ischemic changes of deep cerebral white matter. 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 No acute intracranial abnormalities. Electronically Signed   By: Lavonia Dana M.D.   On: 07/15/2018 18:09   Ct Angio Chest Pe W And/or Wo Contrast  Result Date: 07/15/2018 CLINICAL DATA:  85 year old male with cough and congestion not improving with antibiotics for the past 4 days. EXAM: CT ANGIOGRAPHY CHEST WITH CONTRAST TECHNIQUE: Multidetector CT imaging of the chest was performed using the standard protocol during bolus administration of intravenous contrast. Multiplanar CT image reconstructions and MIPs were obtained  to evaluate the vascular anatomy. CONTRAST:  17mL OMNIPAQUE IOHEXOL 350 MG/ML SOLN COMPARISON:  Chest CT 11/14/2006 FINDINGS: Cardiovascular: Good contrast bolus timing in the pulmonary arterial tree. Mild respiratory motion. No focal filling defect identified in the pulmonary arteries to suggest acute pulmonary embolism. Calcified aortic atherosclerosis. Calcified coronary artery atherosclerosis and/or stent. Partially visible abdominal aortic endograft. Ectatic descending thoracic aorta. No contrast in the aorta on this study. Mild cardiomegaly. No pericardial effusion. Mediastinum/Nodes: Negative. No lymphadenopathy. Lungs/Pleura: Centrilobular and paraseptal emphysema. Progressed subpleural reticular opacity in both lungs resembling early honeycombing in areas. Confluent new distal peribronchial opacity in the right upper  lobe on series 6, image 20 compared to 2008, and chest radiographs in 2019. No pleural effusion. Stable right lung subpleural nodule since 2008 on series 6, image 67 (benign). Major airways are patent. Upper Abdomen: Negative visible liver, gallbladder, spleen, pancreas, adrenal glands and bowel in the upper abdomen. Partially visible abdominal aortic endograft. Enlarged but simple fluid density left renal upper pole cyst since the 2008 CT (series 4, image 112. There is probably a small right renal midpole cyst partially visible. Musculoskeletal: Chronic appearing T12 inferior endplate compression fracture versus Schmorl's node. Osteopenia. No acute osseous abnormality identified. Review of the MIP images confirms the above findings. IMPRESSION: 1. Negative for acute pulmonary embolus. 2. Patchy right upper lobe opacity compatible with Bronchopneumonia. No pleural effusion. Recommend repeat noncontrast Chest CT in 3 months to evaluate persistence versus resolution. This recommendation follows the consensus statement: Recommendations for the Management of Subsolid Pulmonary Nodules Detected at CT: A Statement from the [place] as published in Radiology 2013; 266:304-317. 3. Progressed chronic lung disease since 2008 with Emphysema (ICD10-J43.9). Electronically Signed   By: Genevie Ann M.D.   On: 07/15/2018 18:22    Pending Labs Unresulted Labs (From admission, onward)    Start     Ordered   07/17/18 0500  CBC  Daily,   STAT     07/16/18 0750   07/17/18 0500  Protime-INR  Daily,   STAT     07/16/18 0750   07/17/18 1443  Basic metabolic panel  Tomorrow morning,   STAT     07/16/18 1139          Vitals/Pain Today's Vitals   07/16/18 1058 07/16/18 1138 07/16/18 1303 07/16/18 1419  BP:  92/60  97/65  Pulse: 78 60  72  Resp:  16  18  Temp:  97.8 F (36.6 C)  97.9 F (36.6 C)  TempSrc:  Oral  Oral  SpO2: 96% 96%  97%  Weight:      Height:      PainSc:   0-No pain 0-No pain    Isolation  Precautions No active isolations  Medications Medications  digoxin (LANOXIN) tablet 0.0625 mg (0 mg Oral Hold 07/16/18 0946)  metoprolol succinate (TOPROL-XL) 24 hr tablet 12.5 mg (0 mg Oral Hold 07/16/18 0944)  furosemide (LASIX) tablet 40 mg (40 mg Oral Given 07/16/18 0945)  pantoprazole (PROTONIX) EC tablet 40 mg (40 mg Oral Given 07/16/18 0945)  albuterol (PROVENTIL) (2.5 MG/3ML) 0.083% nebulizer solution 2.5 mg (has no administration in time range)  chlorpheniramine-HYDROcodone (TUSSIONEX) 10-8 MG/5ML suspension 5 mL (5 mLs Oral Given 07/16/18 0945)  guaiFENesin (MUCINEX) 12 hr tablet 600 mg (600 mg Oral Given 07/16/18 0945)  menthol-cetylpyridinium (CEPACOL) lozenge 3 mg (has no administration in time range)  acetaminophen (TYLENOL) tablet 650 mg (has no administration in time range)    Or  acetaminophen (TYLENOL) suppository  650 mg (has no administration in time range)  ondansetron (ZOFRAN) tablet 4 mg (has no administration in time range)    Or  ondansetron (ZOFRAN) injection 4 mg (has no administration in time range)  ceFEPIme (MAXIPIME) 2 g in sodium chloride 0.9 % 100 mL IVPB (0 g Intravenous Stopped 07/16/18 1022)  Warfarin - Pharmacist Dosing Inpatient (has no administration in time range)  lisinopril (PRINIVIL,ZESTRIL) tablet 2.5 mg (0 mg Oral Hold 07/16/18 0944)  oseltamivir (TAMIFLU) capsule 30 mg (30 mg Oral Given 07/16/18 1128)  warfarin (COUMADIN) tablet 2.5 mg (has no administration in time range)  sodium chloride 0.9 % bolus 250 mL (0 mLs Intravenous Stopped 07/15/18 1921)  iohexol (OMNIPAQUE) 350 MG/ML injection 75 mL (75 mLs Intravenous Contrast Given 07/15/18 1750)  vancomycin (VANCOCIN) 1,250 mg in sodium chloride 0.9 % 250 mL IVPB (0 mg Intravenous Stopped 07/15/18 2054)  warfarin (COUMADIN) tablet 1.25 mg (1.25 mg Oral Given 07/15/18 2249)    Mobility walks

## 2018-07-16 NOTE — Progress Notes (Signed)
MD notified of 2.08 second pause on tele. Will continue to monitor.

## 2018-07-16 NOTE — ED Notes (Signed)
Pt assisted to bathroom, patient with fairly stable gait and minimal assist needed.  Pt eating meal tray and drink.  Daughter at bedside at this time.  Pt oriented to call bell and knows to ring out if needs arise.

## 2018-07-17 DIAGNOSIS — I25118 Atherosclerotic heart disease of native coronary artery with other forms of angina pectoris: Secondary | ICD-10-CM

## 2018-07-17 DIAGNOSIS — I959 Hypotension, unspecified: Secondary | ICD-10-CM

## 2018-07-17 DIAGNOSIS — R0603 Acute respiratory distress: Secondary | ICD-10-CM

## 2018-07-17 DIAGNOSIS — I340 Nonrheumatic mitral (valve) insufficiency: Secondary | ICD-10-CM

## 2018-07-17 DIAGNOSIS — J181 Lobar pneumonia, unspecified organism: Secondary | ICD-10-CM

## 2018-07-17 DIAGNOSIS — I5042 Chronic combined systolic (congestive) and diastolic (congestive) heart failure: Secondary | ICD-10-CM

## 2018-07-17 DIAGNOSIS — I4821 Permanent atrial fibrillation: Secondary | ICD-10-CM

## 2018-07-17 LAB — BASIC METABOLIC PANEL
Anion gap: 8 (ref 5–15)
BUN: 24 mg/dL — ABNORMAL HIGH (ref 8–23)
CO2: 22 mmol/L (ref 22–32)
Calcium: 8.5 mg/dL — ABNORMAL LOW (ref 8.9–10.3)
Chloride: 106 mmol/L (ref 98–111)
Creatinine, Ser: 1.21 mg/dL (ref 0.61–1.24)
GFR calc Af Amer: >60 mL/min (ref >60)
GFR calc non Af Amer: 53 mL/min — ABNORMAL LOW (ref >60)
Glucose, Bld: 157 mg/dL — ABNORMAL HIGH (ref 70–99)
POTASSIUM: 3.4 mmol/L — AB (ref 3.5–5.1)
Sodium: 136 mmol/L (ref 135–145)

## 2018-07-17 LAB — CBC
HCT: 39.5 % (ref 39.0–52.0)
Hemoglobin: 12.9 g/dL — ABNORMAL LOW (ref 13.0–17.0)
MCH: 30.4 pg (ref 26.0–34.0)
MCHC: 32.7 g/dL (ref 30.0–36.0)
MCV: 92.9 fL (ref 80.0–100.0)
Platelets: 149 10*3/uL — ABNORMAL LOW (ref 150–400)
RBC: 4.25 MIL/uL (ref 4.22–5.81)
RDW: 13.7 % (ref 11.5–15.5)
WBC: 6.4 10*3/uL (ref 4.0–10.5)
nRBC: 0 % (ref 0.0–0.2)

## 2018-07-17 LAB — DIGOXIN LEVEL: Digoxin Level: 0.8 ng/mL (ref 0.8–2.0)

## 2018-07-17 LAB — HEMOGLOBIN A1C
HEMOGLOBIN A1C: 6.5 % — AB (ref 4.8–5.6)
Mean Plasma Glucose: 139.85 mg/dL

## 2018-07-17 LAB — MAGNESIUM: Magnesium: 2.1 mg/dL (ref 1.7–2.4)

## 2018-07-17 LAB — PROTIME-INR
INR: 2.73
Prothrombin Time: 28.5 seconds — ABNORMAL HIGH (ref 11.4–15.2)

## 2018-07-17 MED ORDER — WARFARIN 1.25 MG HALF TABLET
1.2500 mg | ORAL_TABLET | Freq: Once | ORAL | Status: AC
  Filled 2018-07-17: qty 1

## 2018-07-17 MED ORDER — CARVEDILOL 3.125 MG PO TABS
3.1250 mg | ORAL_TABLET | Freq: Two times a day (BID) | ORAL | Status: DC
  Administered 2018-07-18 (×2): 3.125 mg via ORAL
  Filled 2018-07-17 (×2): qty 1

## 2018-07-17 MED ORDER — POTASSIUM CHLORIDE CRYS ER 10 MEQ PO TBCR
30.0000 meq | EXTENDED_RELEASE_TABLET | Freq: Two times a day (BID) | ORAL | Status: DC
  Administered 2018-07-17 – 2018-07-19 (×5): 30 meq via ORAL
  Filled 2018-07-17 (×5): qty 3

## 2018-07-17 MED ORDER — AMIODARONE HCL IN DEXTROSE 360-4.14 MG/200ML-% IV SOLN
30.0000 mg/h | INTRAVENOUS | Status: DC
  Filled 2018-07-17: qty 200

## 2018-07-17 MED ORDER — AMIODARONE HCL IN DEXTROSE 360-4.14 MG/200ML-% IV SOLN
60.0000 mg/h | INTRAVENOUS | Status: AC
  Administered 2018-07-17 (×2): 60 mg/h via INTRAVENOUS
  Filled 2018-07-17 (×2): qty 200

## 2018-07-17 NOTE — Progress Notes (Signed)
PT Cancellation Note  Patient Details Name: Joseph Barton MRN: [id number] DOB: 1926-09-12   Cancelled Treatment:    Reason Eval/Treat Not Completed: Patient not medically ready.  Per chart review, pt started on amio drip at 0139 this morning d/t a-fib with RVR.  Will hold PT at this time until pt medically appropriate to participate in therapy.  Leitha Bleak, PT 07/17/18, 8:29 AM [phone]

## 2018-07-17 NOTE — Progress Notes (Signed)
Pt hr up in 140's (non sustaining) with 2 runs of vtach (3-4 beat). ORders for magnesium and calcium IV. Will give and continue to monitor.

## 2018-07-17 NOTE — Progress Notes (Signed)
CCMD reports a 2 second pause and HR has dropped to the 50'2 non sustaining. PT asymptomatic and BP is 32-440 systolic. MD made aware, verbal parameters given of HR sustaining less than 50 and BP sustaining in the 10'U systolic.  Update at 0620. Pt HR is maintaining afib 60-70 and BP 72-536 systolic. No additional pauses reported. Pt has no c/o chest pain and no concerns offered.

## 2018-07-17 NOTE — Progress Notes (Signed)
ID informs RN that while patient is on tamiflu patient must remain on droplet precautions. I will re-enter droplet precautions and maintain precautions while pt is on tamiflu. I will continue to assess.

## 2018-07-17 NOTE — Progress Notes (Signed)
MD notified verbally. Pt is on amiodarone gtt. SBP is trending 90-100s and HR is down. MD order to hold all BP medication this a.m. Cardiology notified of patients vital signs. I will continue to assess.

## 2018-07-17 NOTE — Progress Notes (Signed)
Amiodarone gtt discontinued per MD order. Pt is maintaining a heart rate of less than 100 and still atrial fib. I will continue to assess.

## 2018-07-17 NOTE — Consult Note (Signed)
ANTICOAGULATION CONSULT NOTE - Follow Up Consult  Pharmacy Consult for Warfarin dosing Indication: atrial fibrillation  Allergies  Allergen Reactions  . Atorvastatin   . Simvastatin     Patient Measurements: Height: 6\' 1"  (185.4 cm) Weight: 182 lb 15.7 oz (83 kg) IBW/kg (Calculated) : 79.9   Vital Signs: Temp: 98.2 F (36.8 C) (01/15 0132) Temp Source: Oral (01/15 0132) BP: 100/55 (01/15 0430) Pulse Rate: 54 (01/15 0430)  Labs: Recent Labs    07/15/18 1524 07/15/18 1530 07/16/18 0505 07/17/18 0424  HGB 14.1  --  12.9* 12.9*  HCT 43.9  --  40.0 39.5  PLT 193  --  159 149*  LABPROT  --  26.1* 25.4* 28.5*  INR  --  2.43 2.35 2.73  CREATININE 1.17  --  1.10 1.21  TROPONINI <0.03  --   --   --     Estimated Creatinine Clearance: 46.8 mL/min (by C-G formula based on SCr of 1.21 mg/dL).   Medications:  Medications Prior to Admission  Medication Sig Dispense Refill Last Dose  . albuterol (PROVENTIL HFA;VENTOLIN HFA) 108 (90 Base) MCG/ACT inhaler Inhale 2 puffs into the lungs every 6 (six) hours as needed for wheezing or shortness of breath. 1 Inhaler 0 prn at prn  . Bioflavonoid Products (BIOFLEX PO) Take 1 tablet by mouth daily.    07/15/2018 at 0800  . digoxin (LANOXIN) 0.125 MG tablet Take 0.0625 mg by mouth daily.    07/15/2018 at 0800  . furosemide (LASIX) 20 MG tablet Take 40 mg by mouth daily.   07/15/2018 at 0800  . HYDROcodone-homatropine (HYCODAN) 5-1.5 MG/5ML syrup Take 5 mLs by mouth at bedtime as needed for cough. 120 mL 0 07/14/2018 at 2100  . levofloxacin (LEVAQUIN) 500 MG tablet Take 1 tablet (500 mg total) by mouth daily. 5 tablet 0 07/15/2018 at 0800  . lisinopril (PRINIVIL,ZESTRIL) 2.5 MG tablet Take 2.5 mg by mouth daily.   07/15/2018 at 0800  . metoprolol succinate (TOPROL-XL) 25 MG 24 hr tablet TAKE ONE-HALF TABLET BY  MOUTH DAILY 45 tablet 0 07/15/2018 at 0800  . omeprazole (PRILOSEC) 20 MG capsule TAKE 1 CAPSULE BY MOUTH  DAILY 90 capsule 3 07/15/2018 at  0800  . oseltamivir (TAMIFLU) 75 MG capsule Take 1 capsule (75 mg total) by mouth daily for 10 days. 10 capsule 0 07/15/2018 at 0800  . potassium chloride 20 MEQ TBCR Take 20 mEq by mouth daily. 60 tablet 1 07/15/2018 at 0800  . warfarin (COUMADIN) 2.5 MG tablet Take 1 tablet (2.5 mg total) by mouth as directed. (Patient taking differently: Take  tablet (1.25MG ) by mouth Monday, Wednesday, Friday and take 1 tablet (2.5MG ) by mouth Tuesday, Thursday, Saturday and Sunday) 30 tablet 12 07/15/2018 at 0800  . sacubitril-valsartan (ENTRESTO) 24-26 MG Take 1 tablet by mouth 2 (two) times daily. (Patient not taking: Reported on 07/15/2018) 60 tablet 6 Not Taking at Unknown time    Assessment: 85 y.o. male with a known history of CAD status post PCI, congestive heart failure with systolic dysfunction, hypertension, mitral regurgitation, abdominal aortic aneurysm repair presents to hospital secondary to worsening weakness, cough and shortness of breath.  Home dose: Per chart review patient followed by anticoag clinic with recent encounter notated to have dose of: Warfarin 2.5 mg every Sun,Tues,Thurs,Sat Warfarin 1.25 mg every MWF  1/13 INR 2.43 (baseline), Dosed 1.25mg  1/14 INR 2.35, Dosed 2.5mg  1/15 INR 2.73, Dosed 1.25mg     Goal of Therapy:  INR 2-3 Monitor platelets by anticoagulation  protocol: Yes   Plan:  Current INR 2.73 Will continue current home regimen and order warfarin 1.25 mg once. Will drawn INR and CBC daily while patient is on antibiotics.  Lu Duffel, PharmD, BCPS Clinical Pharmacist 07/17/2018 7:09 AM

## 2018-07-17 NOTE — Consult Note (Addendum)
Cardiology Consultation:   Patient ID: Joseph Barton MRN: [id number]; DOB: [birth year]  Admit date: 07/15/2018 Date of Consult: 07/17/2018  Primary Care Provider: Jerrol Barton., MD Primary Cardiologist:Joseph Barton, Dr. Rockey Barton Primary Electrophysiologist: Dr. Caryl Barton   Patient Profile:   Joseph Barton is a 85 y.o. male with a hx of permanent Afib (Coumadin), HFrEF d/t ICM (EF 60%) and complicated by moderate-severe MR, CAD with remote PCI to LAD (2005), AAA (2005) s/p repair (2006), HLD (noted statin intolerance), HTN, neuropathy, and pulmonary nodules who is being seen today for the evaluation of Afib with RVR at the request of Dr. Estanislado Barton.  History of Present Illness:   [name] is an 85 year old male with complex PMH as above most notable for permanent atrial fibrillation, moderate to severe mitral regurgitation, systolic heart failure, and ICM.   NM 2013: Patient underwent nuclear study demonstrating EF 13%. Then, on 11/15/17, patient saw Dr. Caryl Barton as outpatient with c/o SOB and DOE. Recommendation at that time was to start ARB pending metabolic profile, as well as continue twice daily diuretics with close monitoring.  It was noted he should be followed for lightheadedness or changes in renal function.  On 12/2017, [facility] hospital admission for evaluation of shortness of breath, edema, and chest pain.  [facility] consulted with patient was started on IV diuresis. Subsequent echo on 12/2017 showed improved LVEF 25 to 35%, moderate MR, moderate to severe dilation of the left atrium, moderate right atrium dilation, moderate TR, and mildly dilated right ventricles. Discharged with Lasix 40mg   twice daily, lisinopril 2.5 mg p.o. daily, Toprol-XL 25 mg p.o. daily, potassium supplementation, and warfarin 2.5 mg. Digoxin was stopped at discharge. CHF clinic follow-up scheduled. On 06/2018, patient seen again as outpatient by Dr. Caryl Barton with discussion regarding candidacy for MitraClip. Per documentation, patient was  transitioned from lisinopril to [facility] at that time.  Over the past 2-3 weeks (since early January 2020), the patient has experienced progressive DOE, SOB, weakness, chronic cough, sore throat, hoarse voice, and weight loss. On 07/15/2018, he visited his outpatient Family Medicine PA-C d/t these sx. 07/11/2018 CXR revealed RLL PNA for which he was treated with Levaquin with minimal improvement.  Outpatient Family Medicine PA-C documentation stated patient was asymptomatic at time of 1/13 visit despite hypotension. She noted he was also tachypneic and "overall appearing worse than before" at time of presentation. D/t sx progression, she sent the patient from her office to [facility] ED for further evaluation.  In [facility] ED, INR 2.43, BNP 612. Vitals notable for soft BP 102/75.  EKG showed atrial fibrillation with ventricular rate 102. Patient reportedly had normal oxygen saturations when at rest; however, with ambulation sats decreased to 83% and patient became symptomatic. CXR showed acute on chronic infiltrate in the lateral aspect of the right upper lobe. CTA negative for pulmonary embolism.    He was admitted to [facility] telemetry floor with a diagnosis of pneumonia and cardiology consulted for further management of Afib d/t rapid ventricular rates.  Since admission, review of telemetry has shown continued asymptomatic A. fib with RVR, as well as wide-complex tachycardia with a maximum rate of 166 bpm (between the hours of 12 AM and 2 AM on 1/15).  Patient was therefore started on amiodarone drip  Subsequent telemetry showed several pauses (maximum length ~ 2.4 seconds), vitals notable for soft blood pressure with systolic blood pressure in the 90s to low 100s and noted episode of bradycardia.  Cardiology was called due to SBP 90-1  100s, bradycardia.  Recommendation to hold all antihypertensives with IM noting plan to wean off amiodarone drip and hold Lasix, BB, and Entresto due to hypotension. Entresto then changed  to lisinopril and metoprolol to Coreg.  Past Medical History:  Diagnosis Date  . Abdominal aortic aneurysm ([place]) 01-2004   4.7 x 4.7 cm.  Marland Kitchen CAD (coronary artery disease) 01/25/2004   a. 01/2004 Ant MI/DES to LAD;  b. 10/2011 Neg MV.  . CHF (congestive heart failure) ([place])   . Chronic systolic heart failure ([place])    a. 08/2016 Echo: EF 35%, nl RV fxn, mod to sev MR, mild to mod TR, mod biatrial enlargement.  . Hyperlipidemia   . Hypertension   . Ischemic cardiomyopathy    a. 08/2016 Echo: EF 35%.  . Moderate to Severe Mitral regurgitation    a. 08/2016 Echo: Mod-sev MR.  Marland Kitchen Permanent atrial fibrillation    a. Chronic coumadin (CHA2DS2VASc = 5).  . Pulmonary nodules    Noted on abdominal CT  . Statin intolerance     Past Surgical History:  Procedure Laterality Date  . ABDOMINAL AORTIC ANEURYSM REPAIR     12/02/2007 [facility]  . ABDOMINAL AORTIC ANEURYSM REPAIR  2006   [facility]   . CARDIAC CATHETERIZATION  2009   [facility]  . CORONARY ANGIOPLASTY WITH STENT PLACEMENT  2005   Cypher stent LAD   . Cypher stents to LAD     01/25/2004  . SKIN SURGERY  2016   [facility] skin cancer     Home Medications:  Prior to Admission medications   Medication Sig Start Date End Date Taking? Authorizing Provider  albuterol (PROVENTIL HFA;VENTOLIN HFA) 108 (90 Base) MCG/ACT inhaler Inhale 2 puffs into the lungs every 6 (six) hours as needed for wheezing or shortness of breath. 11/29/17  Yes Joseph Barton., MD  Bioflavonoid Products (BIOFLEX PO) Take 1 tablet by mouth daily.    Yes [provider]  digoxin (LANOXIN) 0.125 MG tablet Take 0.0625 mg by mouth daily.    Yes [provider]  furosemide (LASIX) 20 MG tablet Take 40 mg by mouth daily.   Yes [provider]  HYDROcodone-homatropine (HYCODAN) 5-1.5 MG/5ML syrup Take 5 mLs by mouth at bedtime as needed for cough. 07/11/18  Yes Joseph Post, PA-C  levofloxacin (LEVAQUIN) 500 MG tablet Take 1 tablet (500 mg total) by  mouth daily. 07/12/18  Yes Carles Collet M, PA-C  lisinopril (PRINIVIL,ZESTRIL) 2.5 MG tablet Take 2.5 mg by mouth daily.   Yes [provider]  metoprolol succinate (TOPROL-XL) 25 MG 24 hr tablet TAKE ONE-HALF TABLET BY  MOUTH DAILY 04/08/18  Yes Deboraha Sprang, MD  omeprazole (PRILOSEC) 20 MG capsule TAKE 1 CAPSULE BY MOUTH  DAILY 02/16/18  Yes Joseph Barton., MD  oseltamivir (TAMIFLU) 75 MG capsule Take 1 capsule (75 mg total) by mouth daily for 10 days. 07/11/18 07/21/18 Yes Joseph Post, PA-C  potassium chloride 20 MEQ TBCR Take 20 mEq by mouth daily. 12/06/17 07/15/18 Yes Sainani, Belia Heman, MD  warfarin (COUMADIN) 2.5 MG tablet Take 1 tablet (2.5 mg total) by mouth as directed. Patient taking differently: Take  tablet (1.25MG ) by mouth Monday, Wednesday, Friday and take 1 tablet (2.5MG ) by mouth Tuesday, Thursday, Saturday and Sunday 10/03/17  Yes Joseph Barton., MD  sacubitril-valsartan (ENTRESTO) 24-26 MG Take 1 tablet by mouth 2 (two) times daily. Patient not taking: Reported on 07/15/2018 06/20/18   Deboraha Sprang,  MD    Inpatient Medications: Scheduled Meds: . chlorpheniramine-HYDROcodone  5 mL Oral Q12H  . digoxin  0.0625 mg Oral Daily  . furosemide  40 mg Oral Daily  . guaiFENesin  600 mg Oral BID  . lisinopril  2.5 mg Oral Daily  . metoprolol succinate  12.5 mg Oral Daily  . oseltamivir  30 mg Oral Daily  . pantoprazole  40 mg Oral Daily  . warfarin  1.25 mg Oral ONCE-1800  . Warfarin - Pharmacist Dosing Inpatient   Does not apply q1800   Continuous Infusions: . amiodarone 30 mg/hr (07/17/18 0721)  . ceFEPime (MAXIPIME) IV 2 g (07/16/18 2109)   PRN Meds: acetaminophen **OR** acetaminophen, albuterol, menthol-cetylpyridinium, ondansetron **OR** ondansetron (ZOFRAN) IV  Allergies:    Allergies  Allergen Reactions  . Atorvastatin   . Simvastatin     Social History:   Social History   Socioeconomic History  . Marital status: Married     Spouse name: Not on file  . Number of children: 1  . Years of education: Not on file  . Highest education level: Not on file  Occupational History  . Not on file  Social Needs  . Financial resource strain: Not on file  . Food insecurity:    Worry: Not on file    Inability: Not on file  . Transportation needs:    Medical: Not on file    Non-medical: Not on file  Tobacco Use  . Smoking status: Former Smoker    Last attempt to quit: 12/01/1987    Years since quitting: 30.6  . Smokeless tobacco: Never Used  Substance and Sexual Activity  . Alcohol use: No  . Drug use: No  . Sexual activity: Not Currently  Lifestyle  . Physical activity:    Days per week: Not on file    Minutes per session: Not on file  . Stress: Not on file  Relationships  . Social connections:    Talks on phone: Not on file    Gets together: Not on file    Attends religious service: Not on file    Active member of club or organization: Not on file    Attends meetings of clubs or organizations: Not on file    Relationship status: Not on file  . Intimate partner violence:    Fear of current or ex partner: Not on file    Emotionally abused: Not on file    Physically abused: Not on file    Forced sexual activity: Not on file  Other Topics Concern  . Not on file  Social History Narrative   Lives at home with his family.  Independent at baseline    Family History:    Family History  Problem Relation Age of Onset  . Heart attack Father 74       Died w/ Heart attack  . Diabetes Father   . Heart attack Brother   . Heart disease Brother   . Diabetes Sister   . Heart disease Sister      ROS:  Please see the history of present illness.   Review of Systems  Constitutional:       PNA admission  Respiratory: Positive for cough. Negative for shortness of breath.        PNA  Cardiovascular: Negative for chest pain and leg swelling.  Neurological: Positive for weakness. Negative for dizziness.  All  other systems reviewed and are negative.   All other ROS reviewed and negative.  Physical Exam/Data:   Vitals:   07/17/18 0700 07/17/18 0727 07/17/18 0752 07/17/18 0800  BP: 106/71 96/64 96/64  97/66  Pulse: (!) 58 73 63 (!) 56  Resp:   19   Temp:   98.5 F (36.9 C)   TempSrc:   Oral   SpO2:   97%   Weight:      Height:        Intake/Output Summary (Last 24 hours) at 07/17/2018 1103 Last data filed at 07/17/2018 0014 Gross per 24 hour  Intake 240 ml  Output 125 ml  Net 115 ml   Filed Weights   07/15/18 1522 07/17/18 0132  Weight: 84.4 kg 83 kg   Body mass index is 24.14 kg/m.  General:  Well nourished, well developed, in no acute distress but eager to go home HEENT: normal Neck: no JVD noted Vascular: No carotid bruits; FA pulses 2+ bilaterally without bruits  Cardiac: IRIR; no murmur on exam Lungs:  Reduced bibasilar breath sounds R>L, no noted wheezing, rhonchi or rales  Abd: soft, nontender, no hepatomegaly  Ext: no LEE  Musculoskeletal:  No deformities, BUE and BLE strength normal and equal Skin: warm and dry  Neuro:  CNs 2-12 intact, no focal abnormalities noted Psych:  Normal affect   EKG: Refer to HPI Telemetry:  Telemetry was personally reviewed and demonstrates:  Afib, wide complex tachycardia 12am-2AM on 1/15  CV Studies:   Relevant CV Studies: 12/05/2017 TTE Study Conclusions - Left ventricle: The cavity size was mildly dilated. Systolic   function was severely reduced. The estimated ejection fraction   was in the range of 25% to 30%. Diffuse hypokinesis. Akinesis of   the apical and peri-apical myocardium. The study is not   technically sufficient to allow evaluation of LV diastolic   function. - Mitral valve: There was moderate regurgitation. - Left atrium: The atrium was moderately to severely dilated. - Right ventricle: The cavity size was mildly dilated. Wall   thickness was normal. Systolic function was mildly to moderately   reduced. -  Right atrium: The atrium was moderately dilated. - Tricuspid valve: There was moderate regurgitation directed   eccentrically. - Pulmonary arteries: Systolic pressure was moderate to severely   elevated. PA peak pressure: 61 mm Hg (S).  Laboratory Data:  Chemistry Recent Labs  Lab 07/15/18 1524 07/16/18 0505 07/17/18 0424  NA 137 138 136  K 4.2 3.8 3.4*  CL 105 107 106  CO2 25 23 22   GLUCOSE 118* 102* 157*  BUN 24* 23 24*  CREATININE 1.17 1.10 1.21  CALCIUM 9.0 8.7* 8.5*  GFRNONAA 55* 59* 53*  GFRAA >60 >60 >60  ANIONGAP 7 8 8     Recent Labs  Lab 07/15/18 1524  PROT 7.7  ALBUMIN 3.6  AST 20  ALT 13  ALKPHOS 78  BILITOT 0.8   Hematology Recent Labs  Lab 07/15/18 1524 07/16/18 0505 07/17/18 0424  WBC 7.4 6.0 6.4  RBC 4.70 4.31 4.25  HGB 14.1 12.9* 12.9*  HCT 43.9 40.0 39.5  MCV 93.4 92.8 92.9  MCH 30.0 29.9 30.4  MCHC 32.1 32.3 32.7  RDW 13.4 13.5 13.7  PLT 193 159 149*   Cardiac Enzymes Recent Labs  Lab 07/15/18 1524  TROPONINI <0.03   No results for input(s): TROPIPOC in the last 168 hours.  BNP Recent Labs  Lab 07/15/18 1530  BNP 612.0*    DDimer No results for input(s): DDIMER in the last 168 hours.  Radiology/Studies:  Dg Chest 2  View  Result Date: 07/15/2018 CLINICAL DATA:  Cough and shortness of breath EXAM: CHEST - 2 VIEW COMPARISON:  07/11/2018 FINDINGS: Cardiac shadow is mildly enlarged but stable. Aortic calcifications are again seen. Chronic fibrotic changes are noted stable from previous exam. The patchy acute infiltrate in the lateral aspect of the right upper lobe is again identified and stable. No Joseph Barton focal infiltrate or effusion is seen. Lungs remain hyperinflated. Aortic stent graft is noted. IMPRESSION: Acute on chronic infiltrate in the lateral aspect of the right upper lobe stable from the prior exam. No Joseph Barton focal abnormality is noted. Electronically Signed   By: Inez Catalina M.D.   On: 07/15/2018 16:16   Ct Head Wo  Contrast  Result Date: 07/15/2018 CLINICAL DATA:  Subacute neurological deficits EXAM: CT HEAD WITHOUT CONTRAST TECHNIQUE: Contiguous axial images were obtained from the base of the skull through the vertex without intravenous contrast. Sagittal and coronal MPR images reconstructed from axial data set. COMPARISON:  None FINDINGS: Brain: Generalized atrophy. Normal ventricular morphology. No midline shift or mass effect. Small vessel chronic ischemic changes of deep cerebral white matter. 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 No intracranial hemorrhage, mass lesion, or evidence of acute infarction. No extra-axial fluid collections. Vascular: Mild atherosclerotic calcification of internal carotid arteries bilaterally at skull base. Skull: Intact Sinuses/Orbits: Clear Other: N/A IMPRESSION: Atrophy with small vessel chronic ischemic changes of deep cerebral white matter. 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 No acute intracranial abnormalities. Electronically Signed   By: Lavonia Dana M.D.   On: 07/15/2018 18:09   Ct Angio Chest Pe W And/or Wo Contrast  Result Date: 07/15/2018 CLINICAL DATA:  85 year old male with cough and congestion not improving with antibiotics for the past 4 days. EXAM: CT ANGIOGRAPHY CHEST WITH CONTRAST TECHNIQUE: Multidetector CT imaging of the chest was performed using the standard protocol during bolus administration of intravenous contrast. Multiplanar CT image reconstructions and MIPs were obtained to evaluate the vascular anatomy. CONTRAST:  58mL OMNIPAQUE IOHEXOL 350 MG/ML SOLN COMPARISON:  Chest CT 11/14/2006 FINDINGS: Cardiovascular: Good contrast bolus timing in the pulmonary arterial tree. Mild respiratory motion. No focal filling defect identified in the pulmonary arteries to suggest acute pulmonary embolism. Calcified aortic atherosclerosis. Calcified coronary artery atherosclerosis and/or stent. Partially visible abdominal aortic endograft. Ectatic descending thoracic aorta. No  contrast in the aorta on this study. Mild cardiomegaly. No pericardial effusion. Mediastinum/Nodes: Negative. No lymphadenopathy. Lungs/Pleura: Centrilobular and paraseptal emphysema. Progressed subpleural reticular opacity in both lungs resembling early honeycombing in areas. Confluent Joseph Barton distal peribronchial opacity in the right upper lobe on series 6, image 20 compared to 2008, and chest radiographs in 2019. No pleural effusion. Stable right lung subpleural nodule since 2008 on series 6, image 67 (benign). Major airways are patent. Upper Abdomen: Negative visible liver, gallbladder, spleen, pancreas, adrenal glands and bowel in the upper abdomen. Partially visible abdominal aortic endograft. Enlarged but simple fluid density left renal upper pole cyst since the 2008 CT (series 4, image 112. There is probably a small right renal midpole cyst partially visible. Musculoskeletal: Chronic appearing T12 inferior endplate compression fracture versus Schmorl's node. Osteopenia. No acute osseous abnormality identified. Review of the MIP images confirms the above findings. IMPRESSION: 1. Negative for acute pulmonary embolus. 2. Patchy right upper lobe opacity compatible with Bronchopneumonia. No pleural effusion. Recommend repeat noncontrast Chest CT in 3 months to evaluate persistence versus resolution. This recommendation follows the consensus statement: Recommendations for the Management of Subsolid Pulmonary Nodules Detected at CT: A Statement from  the Fleischner Society as published in Radiology 2013; 266:304-317. 3. Progressed chronic lung disease since 2008 with Emphysema (ICD10-J43.9). Electronically Signed   By: Genevie Ann M.D.   On: 07/15/2018 18:22    Assessment and Plan:   Permanent Afib with run of wide complex tachycardia per telemetry -CHA2DS2VASc score of at least 5 (agex2, vascular, HFrEF, HTN) and pending updated A1C to further risk stratify for thrombotic risk (DM2).  Continue PTA warfarin per  pharmacy with INR ordered and within therapeutic range.  -Amiodarone drip started this admission and today 1/15 after telemetry showed wide complex tachycardia 12AM-2AM on 1/15. Start of drip with subsequent drop of ventricular rates, as well as 2.3-2.4 second pauses. SBP 90-100s. Patient remains asx. Cardiology established Joseph Barton hold parameters for medications with recommendation to hold if HR rate less than 50 and SBP less than 80. Orthostatics completed and in chart given earlier documented concern for hypovolemia or orthostatic hypotension. -Continue ventricular rate control with hold parameters. Currently on PTA digoxin with level checked /at goal. Consider weaning amiodarone drip. Changed metoprolol to Coreg 3.125 mg BID BP. Entresto changed to lisinopril and recommend restart with stabilization of BP if possible. - Daily BMET- Hypokalemic with potassium repletion ordered. Continue to monitor renal function and electrolytes as ongoing diuretic with low BP. Checking Mg and TSH.  - Continue to monitor on telemetry. Consider EP consultation. If continued wide complex tachycardia /Afib with RVR, and ventricular rate continues to be difficult to medically manage with periodic hypotension and bradycardia, may need to consider CRT/PPM placement. If longer pauses on telemetry (longer than 3 seconds), especially if symptomatic, patient will need reassessed for chronotropic competence. If SSS, will need PPM.   HFrEF complicated by moderate to severe MR -Followed by Dr. Caryl Barton as above in HPI with consideration of mitraclip. As outpatient, could update echo once rate controlled.  - Continue comanagement with Coreg and lisinopril as BP permits.  Continue digoxin. Continue diuretic. Monitor renal function closely for Cr bump. Monitor vitals for signs for hypovolemia given PNA. Daily weights and I's/O's.  Hypotension, h/o HTN -Soft SBP likely in the setting of illness as current pneumonia.  Hold parameters for  medications and Toprol changed to Coreg for 1/16 to be started with newly established hold parameters.   CAD -No reported chest pain.  Troponin negative.  EKG Afib without acute changes.  Continue medical management as above.  Not on a statin due to history of intolerance.  CKDIII - Daily BMET       For questions or updates, please contact [place] Please consult [email or web address] for contact info under     Signed, Arvil Chaco, PA-C  07/17/2018 11:03 AM

## 2018-07-17 NOTE — Progress Notes (Signed)
Initial Nutrition Assessment  DOCUMENTATION CODES:   Non-severe (moderate) malnutrition in context of chronic illness  INTERVENTION:   - Magic cup TID with meals, each supplement provides 290 kcal and 9 grams of protein  - Liberalize diet to Regular  NUTRITION DIAGNOSIS:   Moderate Malnutrition related to chronic illness (CHF) as evidenced by moderate fat depletion, moderate muscle depletion, percent weight loss (5.8% weight loss in less than 1 month).  GOAL:   Patient will meet greater than or equal to 90% of their needs  MONITOR:   PO intake, Supplement acceptance, Labs, Weight trends  REASON FOR ASSESSMENT:   Malnutrition Screening Tool    ASSESSMENT:   85 year old male who presented to the ED on 1/13 with SOB. PMH significant for HTN, CHF, CAD, atrial fibrillation on Coumadin, HLD, mitral regurgitation. Chest x-ray revealing new right upper lobe PNA.  Spoke with pt at bedside. Pt reports having a good appetite and eating well PTA but that the last 2 weeks have been hard due to not feeling well. Pt states that he has been coughing so much which has made his throat sore when swallowing food.  Pt reports that he ate breakfast this morning and had pancakes, eggs, and applesauce. Pt reports typically eating 3 meals daily. Pt states that he or his wife cook and that his daughter and neighbor occasionally bring food as well.  Breakfast: oatmeal or ham and eggs Lunch: sandwich or soup Dinner: beef or chicken Beverages: soda, water, lemonade  Pt reports that he does not drink oral nutrition supplements but does drink milkshakes and eat ice cream. Pt states that he is willing to try orange cream Magic Cup. RD to order.  Pt denies any issues chewing or swallowing normally. Pt states that he takes a MVI and vitamin B-12 at home.  Pt shares that his UBW is 190-192 lbs and that he last weighed this 3 weeks PTA. Pt then states that he believes he has been losing weight since  Thanksgiving and estimates "about 9 lbs." Per weight history in chart, pt with 5.1 kg weight loss since 06/20/18. This is a 5.8% weight loss in less than 1 month which is significant for timeframe.  Pt requests menu and would like to order lunch. RD contacted dining services who will bring a menu to pt's room.  Meal Completion: 100%  Medications reviewed and include: Lasix 40 mg daily, Protonix, warfarin, IV amiodarone, IV Maxipime  Labs reviewed: potassium 3.4 (L), BUN 24 (H)  NUTRITION - FOCUSED PHYSICAL EXAM:    Most Recent Value  Orbital Region  Moderate depletion  Upper Arm Region  Moderate depletion  Thoracic and Lumbar Region  Moderate depletion  Buccal Region  Moderate depletion  Temple Region  Moderate depletion  Clavicle Bone Region  Moderate depletion  Clavicle and Acromion Bone Region  Severe depletion  Scapular Bone Region  Moderate depletion  Dorsal Hand  Moderate depletion  Patellar Region  Moderate depletion  Anterior Thigh Region  Moderate depletion  Posterior Calf Region  Moderate depletion  Edema (RD Assessment)  None  Hair  Reviewed  Eyes  Reviewed  Mouth  Reviewed  Skin  Reviewed  Nails  Reviewed       Diet Order:   Diet Order            Diet regular Room service appropriate? Yes; Fluid consistency: Thin  Diet effective now              EDUCATION NEEDS:  Education needs have been addressed  Skin:  Skin Assessment: Reviewed RN Assessment  Last BM:  1/12  Height:   Ht Readings from Last 1 Encounters:  07/15/18 6\' 1"  (1.854 m)    Weight:   Wt Readings from Last 1 Encounters:  07/17/18 83 kg    Ideal Body Weight:  83.6 kg  BMI:  Body mass index is 24.14 kg/m.  Estimated Nutritional Needs:   Kcal:  1800-2000  Protein:  90-105 grams  Fluid:  1.8-2.0 L    Gaynell Face, MS, RD, LDN Inpatient Clinical Dietitian Pager: [phone] Weekend/After Hours: [phone]

## 2018-07-17 NOTE — Progress Notes (Signed)
[place] [facility] [place] NAME: Joseph Barton    MR#:  [id number]  DATE OF BIRTH:  Mar 09, 1927  SUBJECTIVE: Patient denies any cough, shortness of breath, feels stronger than admission.  CHIEF COMPLAINT:   Chief Complaint  Patient presents with  . Shortness of Breath  To telemetry yesterday because of episode of falls and also tachycardia started on amiodarone drip on telemetry, this morning heart rate is around 60s, and blood pressure is around 101/75 but episode of hypotension noted. Denies any chest pain or shortness of breath. REVIEW OF SYSTEMS:    ROS  CONSTITUTIONAL: No documented fever. Has fatigue, weakness. No weight gain, no weight loss.  EYES: No blurry or double vision.  ENT: No tinnitus. No postnasal drip. No redness of the oropharynx.  RESPIRATORY: Has cough, no wheeze, no hemoptysis. Decreased dyspnea.  CARDIOVASCULAR: No chest pain. No orthopnea. No palpitations. No syncope.  GASTROINTESTINAL: No nausea, no vomiting or diarrhea. No abdominal pain. No melena or hematochezia.  GENITOURINARY: No dysuria or hematuria.  ENDOCRINE: No polyuria or nocturia. No heat or cold intolerance.  HEMATOLOGY: No anemia. No bruising. No bleeding.  INTEGUMENTARY: No rashes. No lesions.  MUSCULOSKELETAL: No arthritis. No swelling. No gout.  NEUROLOGIC: No numbness, tingling, or ataxia. No seizure-type activity.  PSYCHIATRIC: No anxiety. No insomnia. No ADD.   DRUG ALLERGIES:   Allergies  Allergen Reactions  . Atorvastatin   . Simvastatin     VITALS:  Blood pressure 97/66, pulse (!) 56, temperature 98.5 F (36.9 C), temperature source Oral, resp. rate 19, height 6\' 1"  (1.854 m), weight 83 kg, SpO2 97 %.  PHYSICAL EXAMINATION:   Physical Exam  GENERAL:  85 y.o.-year-old patient lying in the bed with no acute distress.  EYES: Pupils equal, round, reactive to light  No scleral icterus. Extraocular muscles intact.  HEENT: Head atraumatic,  normocephalic. Oropharynx and nasopharynx clear.  NECK:  Supple, no jugular venous distention. No thyroid enlargement, no tenderness.  LUNGS:  clear to auscultation, no wheeze, no rales.Marland Kitchen  CARDIOVASCULAR: S1, S2 normal. No murmurs, rubs, or gallops.  ABDOMEN: Soft, nontender, nondistended. Bowel sounds present. No organomegaly or mass.  EXTREMITIES: No cyanosis, clubbing or edema b/l.    NEUROLOGIC: Cranial nerves II through XII are intact. No focal Motor or sensory deficits b/l.   PSYCHIATRIC: The patient is alert and oriented x 3.  SKIN: No obvious rash, lesion, or ulcer.   LABORATORY PANEL:   CBC Recent Labs  Lab 07/17/18 0424  WBC 6.4  HGB 12.9*  HCT 39.5  PLT 149*   ------------------------------------------------------------------------------------------------------------------ Chemistries  Recent Labs  Lab 07/15/18 1524  07/17/18 0424  NA 137   < > 136  K 4.2   < > 3.4*  CL 105   < > 106  CO2 25   < > 22  GLUCOSE 118*   < > 157*  BUN 24*   < > 24*  CREATININE 1.17   < > 1.21  CALCIUM 9.0   < > 8.5*  AST 20  --   --   ALT 13  --   --   ALKPHOS 78  --   --   BILITOT 0.8  --   --    < > = values in this interval not displayed.   ------------------------------------------------------------------------------------------------------------------  Cardiac Enzymes Recent Labs  Lab 07/15/18 1524  TROPONINI <0.03   ------------------------------------------------------------------------------------------------------------------  RADIOLOGY:  Dg Chest 2 View  Result Date: 07/15/2018 CLINICAL DATA:  Cough and shortness of breath EXAM: CHEST - 2 VIEW COMPARISON:  07/11/2018 FINDINGS: Cardiac shadow is mildly enlarged but stable. Aortic calcifications are again seen. Chronic fibrotic changes are noted stable from previous exam. The patchy acute infiltrate in the lateral aspect of the right upper lobe is again identified and stable. No new focal infiltrate or effusion is  seen. Lungs remain hyperinflated. Aortic stent graft is noted. IMPRESSION: Acute on chronic infiltrate in the lateral aspect of the right upper lobe stable from the prior exam. No new focal abnormality is noted. Electronically Signed   By: Inez Catalina M.D.   On: 07/15/2018 16:16   Ct Head Wo Contrast  Result Date: 07/15/2018 CLINICAL DATA:  Subacute neurological deficits EXAM: CT HEAD WITHOUT CONTRAST TECHNIQUE: Contiguous axial images were obtained from the base of the skull through the vertex without intravenous contrast. Sagittal and coronal MPR images reconstructed from axial data set. COMPARISON:  None FINDINGS: Brain: Generalized atrophy. Normal ventricular morphology. No midline shift or mass effect. Small vessel chronic ischemic changes of deep cerebral white matter. 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 No intracranial hemorrhage, mass lesion, or evidence of acute infarction. No extra-axial fluid collections. Vascular: Mild atherosclerotic calcification of internal carotid arteries bilaterally at skull base. Skull: Intact Sinuses/Orbits: Clear Other: N/A IMPRESSION: Atrophy with small vessel chronic ischemic changes of deep cerebral white matter. 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 No acute intracranial abnormalities. Electronically Signed   By: Lavonia Dana M.D.   On: 07/15/2018 18:09   Ct Angio Chest Pe W And/or Wo Contrast  Result Date: 07/15/2018 CLINICAL DATA:  85 year old male with cough and congestion not improving with antibiotics for the past 4 days. EXAM: CT ANGIOGRAPHY CHEST WITH CONTRAST TECHNIQUE: Multidetector CT imaging of the chest was performed using the standard protocol during bolus administration of intravenous contrast. Multiplanar CT image reconstructions and MIPs were obtained to evaluate the vascular anatomy. CONTRAST:  80mL OMNIPAQUE IOHEXOL 350 MG/ML SOLN COMPARISON:  Chest CT 11/14/2006 FINDINGS: Cardiovascular: Good contrast bolus timing in the pulmonary arterial tree.  Mild respiratory motion. No focal filling defect identified in the pulmonary arteries to suggest acute pulmonary embolism. Calcified aortic atherosclerosis. Calcified coronary artery atherosclerosis and/or stent. Partially visible abdominal aortic endograft. Ectatic descending thoracic aorta. No contrast in the aorta on this study. Mild cardiomegaly. No pericardial effusion. Mediastinum/Nodes: Negative. No lymphadenopathy. Lungs/Pleura: Centrilobular and paraseptal emphysema. Progressed subpleural reticular opacity in both lungs resembling early honeycombing in areas. Confluent new distal peribronchial opacity in the right upper lobe on series 6, image 20 compared to 2008, and chest radiographs in 2019. No pleural effusion. Stable right lung subpleural nodule since 2008 on series 6, image 67 (benign). Major airways are patent. Upper Abdomen: Negative visible liver, gallbladder, spleen, pancreas, adrenal glands and bowel in the upper abdomen. Partially visible abdominal aortic endograft. Enlarged but simple fluid density left renal upper pole cyst since the 2008 CT (series 4, image 112. There is probably a small right renal midpole cyst partially visible. Musculoskeletal: Chronic appearing T12 inferior endplate compression fracture versus Schmorl's node. Osteopenia. No acute osseous abnormality identified. Review of the MIP images confirms the above findings. IMPRESSION: 1. Negative for acute pulmonary embolus. 2. Patchy right upper lobe opacity compatible with Bronchopneumonia. No pleural effusion. Recommend repeat noncontrast Chest CT in 3 months to evaluate persistence versus resolution. This recommendation follows the consensus statement: Recommendations for the Management of Subsolid Pulmonary Nodules Detected at CT: A Statement from the [employer] as published in Radiology 2013;  993:[id number]. 3. Progressed chronic lung disease since 2008 with Emphysema (ICD10-J43.9). Electronically Signed   By: Genevie Ann  M.D.   On: 07/15/2018 18:22     ASSESSMENT AND PLAN:  85 year old male patient with history of coronary artery disease, PCI, congestive heart failure with systolic dysfunction, abdominal aortic aneurysm repair, hypertension, mitral regurgitation under hospitalist service for treatment of pneumonia  -Acute hypoxic respiratory distress secondary to pneumonia .,  Improved, no leukocytosis or fever.  Continue cefepime till discharge. Failed outpatient antibiotic therapy with Levaquin Follow-up cultures And is on Tamiflu as a prophylaxis as wife diagnosed with flu.  Now he has no fever or throat pain, no reason to test him for flu.  -Chronic systolic heart failure Well compensated Continue Entresto as BP permits Has chronic atrial fibrillation, episode of atrial fibrillation with RVR yesterday Started  on amiodarone drip but this morning heart rate is in 60s, slightly hypotensive, appreciate cardiology input, wean off amiodarone drip at this time, hold Lasix, Coreg, Entresto because he is hypotensive.  -Coronary artery disease Medical management to continue  More than 50% time spent in counseling, coordination of care All the records are reviewed and case discussed with Care Management/Social Worker. Management plans discussed with the patient, family and they are in agreement.  CODE STATUS: DNR  DVT Prophylaxis: SCDs  TOTAL TIME TAKING CARE OF THIS PATIENT: 40  minutes.   POSSIBLE D/C IN 1 to 2 DAYS, DEPENDING ON CLINICAL CONDITION.  Epifanio Lesches M.D on 07/17/2018 at 11:23 AM  Between 7am to 6pm - Pager - [phone]  After 6pm go to [email or web address] - password [email or web address] [facility]  Office  [phone]  CC: Primary care physician; Jerrol Banana., MD  Note: This dictation was prepared with Dragon dictation along with smaller phrase technology. Any transcriptional errors that result from this process are unintentional.

## 2018-07-18 DIAGNOSIS — E440 Moderate protein-calorie malnutrition: Secondary | ICD-10-CM

## 2018-07-18 LAB — BASIC METABOLIC PANEL
ANION GAP: 7 (ref 5–15)
BUN: 25 mg/dL — ABNORMAL HIGH (ref 8–23)
CO2: 20 mmol/L — ABNORMAL LOW (ref 22–32)
Calcium: 8.5 mg/dL — ABNORMAL LOW (ref 8.9–10.3)
Chloride: 109 mmol/L (ref 98–111)
Creatinine, Ser: 1.12 mg/dL (ref 0.61–1.24)
GFR calc Af Amer: >60 mL/min (ref >60)
GFR calc non Af Amer: 58 mL/min — ABNORMAL LOW (ref >60)
Glucose, Bld: 114 mg/dL — ABNORMAL HIGH (ref 70–99)
Potassium: 4.5 mmol/L (ref 3.5–5.1)
Sodium: 136 mmol/L (ref 135–145)

## 2018-07-18 LAB — PROTIME-INR
INR: 2.57
PROTHROMBIN TIME: 27.2 s — AB (ref 11.4–15.2)

## 2018-07-18 LAB — CBC
HCT: 41.9 % (ref 39.0–52.0)
Hemoglobin: 13.5 g/dL (ref 13.0–17.0)
MCH: 30.2 pg (ref 26.0–34.0)
MCHC: 32.2 g/dL (ref 30.0–36.0)
MCV: 93.7 fL (ref 80.0–100.0)
Platelets: 160 10*3/uL (ref 150–400)
RBC: 4.47 MIL/uL (ref 4.22–5.81)
RDW: 13.6 % (ref 11.5–15.5)
WBC: 6.9 10*3/uL (ref 4.0–10.5)
nRBC: 0 % (ref 0.0–0.2)

## 2018-07-18 LAB — POCT INFLUENZA A/B
Influenza A, POC: NEGATIVE
Influenza B, POC: NEGATIVE

## 2018-07-18 MED ORDER — SODIUM CHLORIDE 0.9 % IV SOLN
INTRAVENOUS | Status: DC | PRN
  Administered 2018-07-18 – 2018-07-19 (×2): 500 mL via INTRAVENOUS

## 2018-07-18 MED ORDER — SENNOSIDES-DOCUSATE SODIUM 8.6-50 MG PO TABS
1.0000 | ORAL_TABLET | Freq: Two times a day (BID) | ORAL | Status: DC
  Administered 2018-07-18 – 2018-07-20 (×5): 1 via ORAL
  Filled 2018-07-18 (×5): qty 1

## 2018-07-18 MED ORDER — WARFARIN SODIUM 2.5 MG PO TABS
2.5000 mg | ORAL_TABLET | Freq: Once | ORAL | Status: AC
  Administered 2018-07-18: 2.5 mg via ORAL
  Filled 2018-07-18: qty 1

## 2018-07-18 MED ORDER — DIGOXIN 125 MCG PO TABS
0.0625 mg | ORAL_TABLET | Freq: Once | ORAL | Status: AC
  Administered 2018-07-18: 0.0625 mg via ORAL
  Filled 2018-07-18: qty 0.5

## 2018-07-18 MED ORDER — METOPROLOL SUCCINATE ER 25 MG PO TB24: 25.0000 mg | ORAL_TABLET | Freq: Every day | ORAL | Status: DC

## 2018-07-18 NOTE — Progress Notes (Signed)
Physical Therapy Treatment Patient Details Name: Joseph Barton MRN: [id number] DOB: [birth year] Today's Date: 07/18/2018    History of Present Illness Pt is an 85 y.o. male presenting to hospital 07/15/18 with weakness, cough, and SOB.  Pt admitted with acute hypoxia secondary R UL PNA.  Pt noted with a-fib with RVR during hospitalization.  PMH includes htn, severe mitral regurgitation, ischemic cardiomyopathy, a-fib on Coumadin, AAA, CHF.    PT Comments    Pt appearing "stiff" in general with ambulation but overall steady ambulating to/from bathroom (no AD).  Steady with static and dynamic activities performing hygiene tasks standing at sink.  Pt's O2 sats 90% or greater on room air with session's activities.  Pt's HR 100 bpm at rest but increased up to 147 bpm with above noted activity.  Deferred further activity d/t elevated HR with activities (nurse notified of pt's HR during session and that pt's HR returned to about 100 bpm at rest end of session).  Will continue to progress pt with strengthening, balance, and progressive functional mobility per pt tolerance.    Follow Up Recommendations  No PT follow up     Equipment Recommendations  None recommended by PT    Recommendations for Other Services       Precautions / Restrictions Precautions Precautions: Fall Restrictions Weight Bearing Restrictions: No    Mobility  Bed Mobility Overal bed mobility: Modified Independent             General bed mobility comments: Semi-supine to sit without any noted difficulties.  Transfers Overall transfer level: Needs assistance Equipment used: None Transfers: Sit to/from [facility] Sit to Stand: Supervision Stand pivot transfers: Supervision       General transfer comment: fairly strong stand and controlled descent sitting; steady  Ambulation/Gait Ambulation/Gait assistance: Supervision Gait Distance (Feet): (30 feet x2 (to bathroom and back) Assistive device:  None   Gait velocity: mildly decreased   General Gait Details: appearing a little "stiff" but overall steady (no loss of balance noted); mostly step through gait pattern   Stairs             Wheelchair Mobility    Modified Rankin (Stroke Patients Only)       Balance Overall balance assessment: Needs assistance Sitting-balance support: No upper extremity supported;Feet supported Sitting balance-Leahy Scale: Normal Sitting balance - Comments: steady sitting reaching outside BOS   Standing balance support: No upper extremity supported;During functional activity Standing balance-Leahy Scale: Normal Standing balance comment: steady standing washing hands and teeth at sink; steady standing reaching forward to reach trash can                            Cognition Arousal/Alertness: Awake/alert Behavior During Therapy: WFL for tasks assessed/performed Overall Cognitive Status: Within Functional Limits for tasks assessed                                        Exercises      General Comments   Nursing cleared pt for participation in physical therapy.  Pt agreeable to PT session.      Pertinent Vitals/Pain Pain Assessment: No/denies pain    Home Living                      Prior Function  PT Goals (current goals can now be found in the care plan section) Acute Rehab PT Goals Patient Stated Goal: to improve breathing PT Goal Formulation: With patient Time For Goal Achievement: 07/30/18 Potential to Achieve Goals: Good Progress towards PT goals: Progressing toward goals    Frequency    Min 2X/week      PT Plan Current plan remains appropriate    Co-evaluation              AM-PAC PT "6 Clicks" Mobility   Outcome Measure  Help needed turning from your back to your side while in a flat bed without using bedrails?: None Help needed moving from lying on your back to sitting on the side of a flat bed  without using bedrails?: None Help needed moving to and from a bed to a chair (including a wheelchair)?: A Little Help needed standing up from a chair using your arms (e.g., wheelchair or bedside chair)?: A Little Help needed to walk in hospital room?: A Little Help needed climbing 3-5 steps with a railing? : A Little 6 Click Score: 20    End of Session Equipment Utilized During Treatment: Gait belt Activity Tolerance: Other (comment)(Limited d/t HR increase with activity) Patient left: in chair;with call bell/phone within reach;Other (comment)(Nurse cleared pt to not have chair alarm) Nurse Communication: Mobility status;Precautions;Other (comment)(pt's HR status during session) PT Visit Diagnosis: Muscle weakness (generalized) (M62.81);Unsteadiness on feet (R26.81)     Time: 3005-1102 PT Time Calculation (min) (ACUTE ONLY): 20 min  Charges:  $Therapeutic Activity: 8-22 mins                    Leitha Bleak, PT 07/18/18, 10:03 AM [phone]

## 2018-07-18 NOTE — Progress Notes (Signed)
[place] [facility] [place] NAME: Joseph Barton    MR#:  [id number]  DATE OF BIRTH:  1926-12-27  SUBJECTIVE: Patient denies any cough, shortness of breath, feels stronger than admission.  CHIEF COMPLAINT:   Chief Complaint  Patient presents with  . Shortness of Breath  Patient seen and evaluated today Heart rate increases whenever patient moves around and ambulates No complaints of any shortness of breath No new episodes of hypoxia  REVIEW OF SYSTEMS:    ROS  CONSTITUTIONAL: No documented fever. Has fatigue, weakness. No weight gain, no weight loss.  EYES: No blurry or double vision.  ENT: No tinnitus. No postnasal drip. No redness of the oropharynx.  RESPIRATORY: Has cough, no wheeze, no hemoptysis. Decreased dyspnea.  CARDIOVASCULAR: No chest pain. No orthopnea. No palpitations. No syncope.  GASTROINTESTINAL: No nausea, no vomiting or diarrhea. No abdominal pain. No melena or hematochezia.  GENITOURINARY: No dysuria or hematuria.  ENDOCRINE: No polyuria or nocturia. No heat or cold intolerance.  HEMATOLOGY: No anemia. No bruising. No bleeding.  INTEGUMENTARY: No rashes. No lesions.  MUSCULOSKELETAL: No arthritis. No swelling. No gout.  NEUROLOGIC: No numbness, tingling, or ataxia. No seizure-type activity.  PSYCHIATRIC: No anxiety. No insomnia. No ADD.   DRUG ALLERGIES:   Allergies  Allergen Reactions  . Atorvastatin   . Simvastatin     VITALS:  Blood pressure (!) 98/58, pulse 71, temperature 98.1 F (36.7 C), temperature source Oral, resp. rate 19, height 6\' 1"  (1.854 m), weight 78.3 kg, SpO2 92 %.  PHYSICAL EXAMINATION:   Physical Exam  GENERAL:  85 y.o.-year-old patient lying in the bed with no acute distress.  EYES: Pupils equal, round, reactive to light  No scleral icterus. Extraocular muscles intact.  HEENT: Head atraumatic, normocephalic. Oropharynx and nasopharynx clear.  NECK:  Supple, no jugular venous distention. No  thyroid enlargement, no tenderness.  LUNGS:  clear to auscultation, no wheeze, no rales.Marland Kitchen  CARDIOVASCULAR: S1, S2 normal. No murmurs, rubs, or gallops.  ABDOMEN: Soft, nontender, nondistended. Bowel sounds present. No organomegaly or mass.  EXTREMITIES: No cyanosis, clubbing or edema b/l.    NEUROLOGIC: Cranial nerves II through XII are intact. No focal Motor or sensory deficits b/l.   PSYCHIATRIC: The patient is alert and oriented x 3.  SKIN: No obvious rash, lesion, or ulcer.   LABORATORY PANEL:   CBC Recent Labs  Lab 07/18/18 0521  WBC 6.9  HGB 13.5  HCT 41.9  PLT 160   ------------------------------------------------------------------------------------------------------------------ Chemistries  Recent Labs  Lab 07/15/18 1524  07/17/18 0424 07/18/18 0521  NA 137   < > 136 136  K 4.2   < > 3.4* 4.5  CL 105   < > 106 109  CO2 25   < > 22 20*  GLUCOSE 118*   < > 157* 114*  BUN 24*   < > 24* 25*  CREATININE 1.17   < > 1.21 1.12  CALCIUM 9.0   < > 8.5* 8.5*  MG  --   --  2.1  --   AST 20  --   --   --   ALT 13  --   --   --   ALKPHOS 78  --   --   --   BILITOT 0.8  --   --   --    < > = values in this interval not displayed.   ------------------------------------------------------------------------------------------------------------------  Cardiac Enzymes Recent Labs  Lab 07/15/18 1524  TROPONINI <0.03   ------------------------------------------------------------------------------------------------------------------  RADIOLOGY:  No results found.   ASSESSMENT AND PLAN:  85 year old male patient with history of coronary artery disease, PCI, congestive heart failure with systolic dysfunction, abdominal aortic aneurysm repair, hypertension, mitral regurgitation under hospitalist service for treatment of pneumonia  -Acute hypoxic respiratory distress secondary to pneumonia  .,  Improved, no leukocytosis or fever.  Continue cefepime till discharge. Failed  outpatient antibiotic therapy with Levaquin Follow-up cultures And is on Tamiflu as a prophylaxis as wife diagnosed with flu.  Now he has no fever or throat pain, no reason to test him for flu.  -Chronic systolic heart failure Well compensated Continue Entresto as BP permits Has chronic atrial fibrillation, episode of atrial fibrillation with RVR yesterday Weaned off amiodarone drip Status post cardiology follow-up Adjust medications today Continue beta-blocker and digoxin  -Coronary artery disease Medical management to continue  More than 50% time spent in counseling, coordination of care All the records are reviewed and case discussed with Care Management/Social Worker. Management plans discussed with the patient, family and they are in agreement.  CODE STATUS: DNR  DVT Prophylaxis: SCDs  TOTAL TIME TAKING CARE OF THIS PATIENT: 35  minutes.   POSSIBLE D/C IN 1 to 2 DAYS, DEPENDING ON CLINICAL CONDITION.  Saundra Shelling M.D on 07/18/2018 at 3:10 PM  Between 7am to 6pm - Pager - [phone]  After 6pm go to [email or web address] - password [email or web address] [facility]  Office  [phone]  CC: Primary care physician; Jerrol Banana., MD  Note: This dictation was prepared with Dragon dictation along with smaller phrase technology. Any transcriptional errors that result from this process are unintentional.

## 2018-07-18 NOTE — Consult Note (Signed)
Pharmacy Antibiotic Note  Joseph Barton is a 85 y.o. male admitted on 07/15/2018 with pneumonia Patient failed outpatient antibiotic therapy to levofloxacin. Patient is receiving prophylaxis for influenza as wife is flu +. Pharmacy has been consulted for cefepime and vancomycin dosing.  Plan: Continue Cefepime 2 gm IV every 12 hours (renally adjusted)   Height: 6\' 1"  (185.4 cm) Weight: 172 lb 9.6 oz (78.3 kg) IBW/kg (Calculated) : 79.9  Temp (24hrs), Avg:98.1 F (36.7 C), Min:97.7 F (36.5 C), Max:98.3 F (36.8 C)  Recent Labs  Lab 07/15/18 1524 07/16/18 0505 07/17/18 0424 07/18/18 0521  WBC 7.4 6.0 6.4 6.9  CREATININE 1.17 1.10 1.21 1.12    Estimated Creatinine Clearance: 49.5 mL/min (by C-G formula based on SCr of 1.12 mg/dL).    Allergies  Allergen Reactions  . Atorvastatin   . Simvastatin     Antimicrobials this admission: Cefepime 1/13 >>  Vanco 1/13 >> 1/13  Dose adjustments this admission:   Microbiology results: 1/13 MRSA PCR: (-) 1/13 Group A PCR (-)  Thank you for allowing pharmacy to be a part of this patient's care.   Paticia Stack, PharmD Pharmacy Resident  07/18/2018 8:15 AM

## 2018-07-18 NOTE — Consult Note (Signed)
ANTICOAGULATION CONSULT NOTE - Follow Up Consult  Pharmacy Consult for Warfarin dosing Indication: atrial fibrillation  Allergies  Allergen Reactions  . Atorvastatin   . Simvastatin     Patient Measurements: Height: 6\' 1"  (185.4 cm) Weight: 172 lb 9.6 oz (78.3 kg) IBW/kg (Calculated) : 79.9   Vital Signs: Temp: 98.1 F (36.7 C) (01/16 0506) Temp Source: Oral (01/16 0506) BP: 98/58 (01/16 0506) Pulse Rate: 71 (01/16 0506)  Labs: Recent Labs    07/15/18 1524  07/16/18 0505 07/17/18 0424 07/18/18 0521  HGB 14.1  --  12.9* 12.9* 13.5  HCT 43.9  --  40.0 39.5 41.9  PLT 193  --  159 149* 160  LABPROT  --    < > 25.4* 28.5* 27.2*  INR  --    < > 2.35 2.73 2.57  CREATININE 1.17  --  1.10 1.21 1.12  TROPONINI <0.03  --   --   --   --    < > = values in this interval not displayed.    Estimated Creatinine Clearance: 49.5 mL/min (by C-G formula based on SCr of 1.12 mg/dL).   Medications:  Medications Prior to Admission  Medication Sig Dispense Refill Last Dose  . albuterol (PROVENTIL HFA;VENTOLIN HFA) 108 (90 Base) MCG/ACT inhaler Inhale 2 puffs into the lungs every 6 (six) hours as needed for wheezing or shortness of breath. 1 Inhaler 0 prn at prn  . Bioflavonoid Products (BIOFLEX PO) Take 1 tablet by mouth daily.    07/15/2018 at 0800  . digoxin (LANOXIN) 0.125 MG tablet Take 0.0625 mg by mouth daily.    07/15/2018 at 0800  . furosemide (LASIX) 20 MG tablet Take 40 mg by mouth daily.   07/15/2018 at 0800  . HYDROcodone-homatropine (HYCODAN) 5-1.5 MG/5ML syrup Take 5 mLs by mouth at bedtime as needed for cough. 120 mL 0 07/14/2018 at 2100  . levofloxacin (LEVAQUIN) 500 MG tablet Take 1 tablet (500 mg total) by mouth daily. 5 tablet 0 07/15/2018 at 0800  . lisinopril (PRINIVIL,ZESTRIL) 2.5 MG tablet Take 2.5 mg by mouth daily.   07/15/2018 at 0800  . metoprolol succinate (TOPROL-XL) 25 MG 24 hr tablet TAKE ONE-HALF TABLET BY  MOUTH DAILY 45 tablet 0 07/15/2018 at 0800  .  omeprazole (PRILOSEC) 20 MG capsule TAKE 1 CAPSULE BY MOUTH  DAILY 90 capsule 3 07/15/2018 at 0800  . oseltamivir (TAMIFLU) 75 MG capsule Take 1 capsule (75 mg total) by mouth daily for 10 days. 10 capsule 0 07/15/2018 at 0800  . potassium chloride 20 MEQ TBCR Take 20 mEq by mouth daily. 60 tablet 1 07/15/2018 at 0800  . warfarin (COUMADIN) 2.5 MG tablet Take 1 tablet (2.5 mg total) by mouth as directed. (Patient taking differently: Take  tablet (1.25MG ) by mouth Monday, Wednesday, Friday and take 1 tablet (2.5MG ) by mouth Tuesday, Thursday, Saturday and Sunday) 30 tablet 12 07/15/2018 at 0800  . sacubitril-valsartan (ENTRESTO) 24-26 MG Take 1 tablet by mouth 2 (two) times daily. (Patient not taking: Reported on 07/15/2018) 60 tablet 6 Not Taking at Unknown time    Assessment: 85 y.o. male with a known history of CAD status post PCI, congestive heart failure with systolic dysfunction, hypertension, mitral regurgitation, abdominal aortic aneurysm repair presents to hospital secondary to worsening weakness, cough and shortness of breath.  Home dose: Per chart review patient followed by anticoag clinic with recent encounter notated to have dose of: Warfarin 2.5 mg every Sun,Tues,Thurs,Sat Warfarin 1.25 mg every MWF  1/13 INR  2.43 (baseline), Dosed 1.25mg  1/14 INR 2.35, Dosed 2.5mg  1/15 INR 2.73, Dosed 1.25mg  1/16 INR 2.57, Dosed 2.5mg    Goal of Therapy:  INR 2-3 Monitor platelets by anticoagulation protocol: Yes   Plan:  Current INR 2.57 Will continue current home regimen and order warfarin 2.5 mg once. Will drawn INR and CBC daily while patient is on antibiotics.  Lu Duffel, PharmD, BCPS Clinical Pharmacist 07/18/2018 7:05 AM

## 2018-07-18 NOTE — Progress Notes (Signed)
Progress Note  Patient Name: Joseph Barton Date of Encounter: 07/18/2018  Primary Cardiologist: Dr. Rockey Situ  Subjective   Patient denied chest pain, palpitations, or racing heart rate. No SOB but did have earlier asymptomatic episode of tachycardia while ambulating to restroom.  Inpatient Medications    Scheduled Meds: . carvedilol  3.125 mg Oral BID WC  . chlorpheniramine-HYDROcodone  5 mL Oral Q12H  . digoxin  0.0625 mg Oral Daily  . furosemide  40 mg Oral Daily  . guaiFENesin  600 mg Oral BID  . lisinopril  2.5 mg Oral Daily  . oseltamivir  30 mg Oral Daily  . pantoprazole  40 mg Oral Daily  . potassium chloride  30 mEq Oral BID  . senna-docusate  1 tablet Oral BID  . warfarin  1.25 mg Oral ONCE-1800  . warfarin  2.5 mg Oral ONCE-1800  . Warfarin - Pharmacist Dosing Inpatient   Does not apply q1800   Continuous Infusions: . ceFEPime (MAXIPIME) IV 2 g (07/18/18 1303)   PRN Meds: acetaminophen **OR** acetaminophen, albuterol, menthol-cetylpyridinium, ondansetron **OR** ondansetron (ZOFRAN) IV   Vital Signs    Vitals:   07/17/18 1128 07/17/18 1550 07/17/18 1950 07/18/18 0506  BP: 98/64 103/80 117/77 (!) 98/58  Pulse: 68 70 82 71  Resp:  19    Temp:  97.7 F (36.5 C) 98.3 F (36.8 C) 98.1 F (36.7 C)  TempSrc:  Oral Oral Oral  SpO2:  95% 96% 92%  Weight:    78.3 kg  Height:        Intake/Output Summary (Last 24 hours) at 07/18/2018 1512 Last data filed at 07/18/2018 0050 Gross per 24 hour  Intake 100 ml  Output 0 ml  Net 100 ml   Filed Weights   07/15/18 1522 07/17/18 0132 07/18/18 0506  Weight: 84.4 kg 83 kg 78.3 kg    Telemetry    Afib with RVR - runs noted at 7:09am to 8AM and again at 9:36am (patient reportedly had ambulated in room to bathroom) - Personally Reviewed  ECG    No new tracings- Personally Reviewed  Physical Exam   GEN: No acute distress.  Sitting in chair. Neck: No JVD Cardiac: IRIR, no murmurs, rubs, or gallops.  Respiratory:  Clear to auscultation bilaterally. GI: Soft, nontender, non-distended  MS: No edema; No deformity. Neuro:  Nonfocal  Psych: Normal affect   Labs    Chemistry Recent Labs  Lab 07/15/18 1524 07/16/18 0505 07/17/18 0424 07/18/18 0521  NA 137 138 136 136  K 4.2 3.8 3.4* 4.5  CL 105 107 106 109  CO2 25 23 22  20*  GLUCOSE 118* 102* 157* 114*  BUN 24* 23 24* 25*  CREATININE 1.17 1.10 1.21 1.12  CALCIUM 9.0 8.7* 8.5* 8.5*  PROT 7.7  --   --   --   ALBUMIN 3.6  --   --   --   AST 20  --   --   --   ALT 13  --   --   --   ALKPHOS 78  --   --   --   BILITOT 0.8  --   --   --   GFRNONAA 55* 59* 53* 58*  GFRAA >60 >60 >60 >60  ANIONGAP 7 8 8 7      Hematology Recent Labs  Lab 07/16/18 0505 07/17/18 0424 07/18/18 0521  WBC 6.0 6.4 6.9  RBC 4.31 4.25 4.47  HGB 12.9* 12.9* 13.5  HCT 40.0 39.5 41.9  MCV 92.8 92.9 93.7  MCH 29.9 30.4 30.2  MCHC 32.3 32.7 32.2  RDW 13.5 13.7 13.6  PLT 159 149* 160    Cardiac Enzymes Recent Labs  Lab 07/15/18 1524  TROPONINI <0.03   No results for input(s): TROPIPOC in the last 168 hours.   BNP Recent Labs  Lab 07/15/18 1530  BNP 612.0*     DDimer No results for input(s): DDIMER in the last 168 hours.   Radiology    No results found.  Cardiac Studies   Relevant CV Studies: 12/05/2017 TTE Study Conclusions - Left ventricle: The cavity size was mildly dilated. Systolic function was severely reduced. The estimated ejection fraction was in the range of 25% to 30%. Diffuse hypokinesis. Akinesis of the apical and peri-apical myocardium. The study is not technically sufficient to allow evaluation of LV diastolic function. - Mitral valve: There was moderate regurgitation. - Left atrium: The atrium was moderately to severely dilated. - Right ventricle: The cavity size was mildly dilated. Wall thickness was normal. Systolic function was mildly to moderately reduced. - Right atrium: The atrium was moderately  dilated. - Tricuspid valve: There was moderate regurgitation directed eccentrically. - Pulmonary arteries: Systolic pressure was moderate to severely elevated. PA peak pressure: 61 mm Hg (S).   Patient Profile     Joseph Barton is a 85 y.o. male with a hx of with a hx of permanent Afib (Coumadin), HFrEF d/t ICM (EF 18%) and complicated by moderate-severe MR, CAD with remote PCI to LAD (2005), AAA (2005) s/p repair (2006), HLD (noted statin intolerance), HTN, neuropathy, and pulmonary nodules who is being seen today for the evaluation of Afib with RVR.  Assessment & Plan    Permanent Afib with RVR -CHA2DS2VASc score of 6 (agex2, vascular, HFrEF, HTN, DM2) - A1C 6.5.   - Telemetry since yesterday's consult shows new runs of RVR this AM at 7-8am and 9:30am and reportedly as patient ambulating in room to bathroom. - Suspicion that runs of RVR multifactorial and due to to combination of decreased EF, pulmonary hypertension, pneumonia, as well as likely COPD with blebs shown on most recent scans and hypotension. Patient ambulated in the hallway today (1/16) with pulse ox showing stable O2 saturation; however, still increased heart rate / RVR with ventricular rate 120s to 150s with continued ambulation.  Patient remains asx despite runs of RVR seen on telemetry. On observation, he is somewhat unstable.   - Plan for ventricular rate control: Discontinued amiodarone d/t pulmonary status. Limited in BB /increasing Coreg dosage due to hypotension. Will therefore instead modify digoxin with plan for extra 0.0625 mg tablet  (2 tablets) to be administered on every other day. Will administer second dose of digoxin today. Plan to ambulate in hall later this evening to assess if improvement on increased digoxin dosage. Further recommendations following ambulation in hall with pulse ox and HR monitoring later today. Continue anticoagulation with PTA warfarin. Daily BMET. Continue to monitor renal function and  electrolytes and replace electrolytes as needed. Continue to monitor on telemetry.   - Will schedule for follow-up with EP/ Dr. Caryl Comes after discharge. Tentative plan to possibly fit for Holter monitor to assess ventricular rates with new digoxin schedule between discharge and follow-up with Dr. Caryl Comes. Patient will need follow-up digoxin lab scheduled as an outpatient.  HFrEF complicated by moderate to severe MR - TTE above. As outpatient, could update echo once rate controlled. Continue comanagement with Coreg and lisinopril as BP permits.  Continue digoxin  with new increased dosage for two tablets every other day. Continue diuretic with caution given low BP / hypotension. Monitor renal function closely for Cr bump. Monitor vitals for signs for hypovolemia given PNA. Daily weights and I's/O's.  MR - Followed by Dr. Caryl Comes  Hypotension, h/o HTN -98/58. Soft asx SBP likely in the setting of illness with current pneumonia and RVR.  Hold parameters for medications.   CAD  -No reported chest pain.  Troponin negative.  EKG Afib without acute changes.  Continue medical management as above.  Not on a statin due to history of intolerance.  CKDIII - Daily BMET  PNA - Abx. Likely contributing to tachycardia.    For questions or updates, please contact [place] Please consult [email or web address] for contact info under        Signed, Arvil Chaco, PA-C  07/18/2018, 3:12 PM

## 2018-07-18 NOTE — Progress Notes (Signed)
Pt ambulated a second time with standby assist on room air around the loop. Tolerated this well. Heart rate went up to 130. sats remained mid 90's. Pt able to converse during walk. Now sitting up in chair.

## 2018-07-19 LAB — BASIC METABOLIC PANEL
Anion gap: 4 — ABNORMAL LOW (ref 5–15)
BUN: 28 mg/dL — ABNORMAL HIGH (ref 8–23)
CALCIUM: 8.5 mg/dL — AB (ref 8.9–10.3)
CO2: 23 mmol/L (ref 22–32)
Chloride: 108 mmol/L (ref 98–111)
Creatinine, Ser: 1.19 mg/dL (ref 0.61–1.24)
GFR calc Af Amer: >60 mL/min (ref >60)
GFR calc non Af Amer: 54 mL/min — ABNORMAL LOW (ref >60)
Glucose, Bld: 108 mg/dL — ABNORMAL HIGH (ref 70–99)
Potassium: 4.4 mmol/L (ref 3.5–5.1)
Sodium: 135 mmol/L (ref 135–145)

## 2018-07-19 LAB — CBC
HCT: 38.4 % — ABNORMAL LOW (ref 39.0–52.0)
Hemoglobin: 12.4 g/dL — ABNORMAL LOW (ref 13.0–17.0)
MCH: 30 pg (ref 26.0–34.0)
MCHC: 32.3 g/dL (ref 30.0–36.0)
MCV: 93 fL (ref 80.0–100.0)
Platelets: 152 10*3/uL (ref 150–400)
RBC: 4.13 MIL/uL — ABNORMAL LOW (ref 4.22–5.81)
RDW: 13.8 % (ref 11.5–15.5)
WBC: 6.2 10*3/uL (ref 4.0–10.5)
nRBC: 0 % (ref 0.0–0.2)

## 2018-07-19 LAB — PROTIME-INR
INR: 2.31
PROTHROMBIN TIME: 25.1 s — AB (ref 11.4–15.2)

## 2018-07-19 MED ORDER — AMIODARONE HCL 200 MG PO TABS
400.0000 mg | ORAL_TABLET | Freq: Once | ORAL | Status: AC
  Administered 2018-07-19: 400 mg via ORAL
  Filled 2018-07-19: qty 2

## 2018-07-19 MED ORDER — AMIODARONE HCL 200 MG PO TABS
400.0000 mg | ORAL_TABLET | Freq: Two times a day (BID) | ORAL | Status: DC
  Administered 2018-07-19 – 2018-07-20 (×3): 400 mg via ORAL
  Filled 2018-07-19 (×3): qty 2

## 2018-07-19 MED ORDER — WARFARIN 1.25 MG HALF TABLET
1.2500 mg | ORAL_TABLET | Freq: Once | ORAL | Status: AC
  Administered 2018-07-19: 1.25 mg via ORAL
  Filled 2018-07-19 (×3): qty 1

## 2018-07-19 MED ORDER — METOPROLOL SUCCINATE ER 25 MG PO TB24
25.0000 mg | ORAL_TABLET | Freq: Every day | ORAL | Status: DC
  Administered 2018-07-19: 25 mg via ORAL
  Filled 2018-07-19 (×2): qty 1

## 2018-07-19 MED ORDER — POTASSIUM CHLORIDE CRYS ER 20 MEQ PO TBCR
30.0000 meq | EXTENDED_RELEASE_TABLET | Freq: Every day | ORAL | Status: DC
  Administered 2018-07-20: 30 meq via ORAL
  Filled 2018-07-19: qty 1

## 2018-07-19 NOTE — Plan of Care (Signed)
  Problem: Clinical Measurements: Goal: Ability to maintain clinical measurements within normal limits will improve Outcome: Progressing   Problem: Activity: Goal: Risk for activity intolerance will decrease Outcome: Progressing   Problem: Activity: Goal: Ability to tolerate increased activity will improve Outcome: Progressing   Problem: Clinical Measurements: Goal: Ability to maintain a body temperature in the normal range will improve Outcome: Progressing

## 2018-07-19 NOTE — Consult Note (Signed)
ANTICOAGULATION CONSULT NOTE - Follow Up Consult  Pharmacy Consult for Warfarin dosing Indication: atrial fibrillation  Allergies  Allergen Reactions  . Atorvastatin   . Simvastatin     Patient Measurements: Height: 6\' 1"  (185.4 cm) Weight: 189 lb 3.2 oz (85.8 kg) IBW/kg (Calculated) : 79.9   Vital Signs: Temp: 97.3 F (36.3 C) (01/17 0719) Temp Source: Oral (01/17 0719) BP: 105/63 (01/17 0719) Pulse Rate: 72 (01/17 0719)  Labs: Recent Labs    07/17/18 0424 07/18/18 0521 07/19/18 0429  HGB 12.9* 13.5 12.4*  HCT 39.5 41.9 38.4*  PLT 149* 160 152  LABPROT 28.5* 27.2* 25.1*  INR 2.73 2.57 2.31  CREATININE 1.21 1.12 1.19    Estimated Creatinine Clearance: 47.6 mL/min (by C-G formula based on SCr of 1.19 mg/dL).   Assessment: 85 y.o. male with a known history of CAD status post PCI, congestive heart failure with systolic dysfunction, hypertension, mitral regurgitation, abdominal aortic aneurysm repair presents to hospital secondary to worsening weakness, cough and shortness of breath.  Home dose: Per chart review patient followed by anticoag clinic with recent encounter notated to have dose of: Warfarin 2.5 mg every Sun,Tues,Thurs,Sat Warfarin 1.25 mg every MWF  1/13 INR 2.43 (baseline), Dosed 1.25mg  3/11 INR 2.16, Dosed 2.5mg  1/15 INR 2.73, Dosed 1.25mg  (pt did not receive) 1/16 INR 2.57, Dosed 2.5mg  1/17 INR 2.31, Dosed 1.25mg     Goal of Therapy:  INR 2-3 Monitor platelets by anticoagulation protocol: Yes   Plan:  Current INR 2.31  Will continue current home regimen and order warfarin 1.25 mg once. Will drawn INR and CBC daily while patient is on antibiotics.  Lu Duffel, PharmD, BCPS Clinical Pharmacist 07/19/2018 7:27 AM

## 2018-07-19 NOTE — Consult Note (Signed)
Pharmacy Antibiotic Note  Joseph Barton is a 85 y.o. male admitted on 07/15/2018 with pneumonia Patient failed outpatient antibiotic therapy to levofloxacin. Patient is receiving prophylaxis for influenza as wife is flu +. Pharmacy has been consulted for cefepime dosing  Plan: Continue Cefepime 2 gm IV every 12 hours (renally adjusted)   Height: 6\' 1"  (185.4 cm) Weight: 189 lb 3.2 oz (85.8 kg) IBW/kg (Calculated) : 79.9  Temp (24hrs), Avg:97.8 F (36.6 C), Min:97.3 F (36.3 C), Max:98.5 F (36.9 C)  Recent Labs  Lab 07/15/18 1524 07/16/18 0505 07/17/18 0424 07/18/18 0521 07/19/18 0429  WBC 7.4 6.0 6.4 6.9 6.2  CREATININE 1.17 1.10 1.21 1.12 1.19    Estimated Creatinine Clearance: 47.6 mL/min (by C-G formula based on SCr of 1.19 mg/dL).    Allergies  Allergen Reactions  . Atorvastatin   . Simvastatin     Antimicrobials this admission: Cefepime 1/13 >>  Vanco 1/13 >> 1/13  Today is day 5 of IV abx   Dose adjustments this admission: N/A  Microbiology results: 1/13 MRSA PCR: (-) 1/13 Group A PCR (-)  Thank you for allowing pharmacy to be a part of this patient's care.  Lu Duffel, PharmD, BCPS Clinical Pharmacist 07/19/2018 7:33 AM

## 2018-07-19 NOTE — Progress Notes (Signed)
Patient AM blood pressure 99/53. Metoprolol XL 25 mg scheduled for 0700. MD Salary notified. Per MD, hold medication. Will continue to monitor.   Joseph Barton

## 2018-07-19 NOTE — Progress Notes (Signed)
Progress Note  Patient Name: Joseph Barton Date of Encounter: 07/19/2018  Primary Cardiologist: Dr. Rockey Situ  Subjective   No chest pain or shortness of breath Elevated heart rate at rest laying in bed 100 bpm Sitting up and eating today rate 120 up to 130 bpm\ Nurses reported episode of diaphoresis but blood pressure was stable, no fever Again he reports he is asymptomatic Telemetry reviewed showing frequent episodes of nonsustained VT this morning as well as elevated rate -Received metoprolol succinate 25 this morning, also started on amiodarone p.o.  Inpatient Medications    Scheduled Meds: . amiodarone  400 mg Oral BID  . amiodarone  400 mg Oral Once  . chlorpheniramine-HYDROcodone  5 mL Oral Q12H  . digoxin  0.0625 mg Oral Daily  . furosemide  40 mg Oral Daily  . guaiFENesin  600 mg Oral BID  . lisinopril  2.5 mg Oral Daily  . metoprolol succinate  25 mg Oral Daily  . oseltamivir  30 mg Oral Daily  . pantoprazole  40 mg Oral Daily  . potassium chloride  30 mEq Oral BID  . senna-docusate  1 tablet Oral BID  . warfarin  1.25 mg Oral ONCE-1800  . Warfarin - Pharmacist Dosing Inpatient   Does not apply q1800   Continuous Infusions: . sodium chloride 500 mL (07/18/18 2107)  . ceFEPime (MAXIPIME) IV 2 g (07/19/18 1036)   PRN Meds: sodium chloride, acetaminophen **OR** acetaminophen, albuterol, menthol-cetylpyridinium, ondansetron **OR** ondansetron (ZOFRAN) IV   Vital Signs    Vitals:   07/19/18 0322 07/19/18 0324 07/19/18 0719 07/19/18 1301  BP: (!) 81/45 (!) 99/53 105/63 (!) 113/98  Pulse: 60 67 72 65  Resp: 18  20   Temp: 98.5 F (36.9 C)  (!) 97.3 F (36.3 C) 98 F (36.7 C)  TempSrc:   Oral Oral  SpO2: 98%  94% 95%  Weight: 85.8 kg     Height:        Intake/Output Summary (Last 24 hours) at 07/19/2018 1414 Last data filed at 07/19/2018 0000 Gross per 24 hour  Intake 100 ml  Output -  Net 100 ml   Filed Weights   07/17/18 0132 07/18/18 0506 07/19/18  0322  Weight: 83 kg 78.3 kg 85.8 kg    Telemetry    Afib with RVR -heart rate up to 100 at rest, 130 with eating , rate however with ambulation personally Reviewed  ECG    No new tracings- Personally Reviewed  Physical Exam   GEN: No acute distress.  Sitting in chair. Neck: No JVD Cardiac: IRIR, no murmurs, rubs, or gallops.  Respiratory: Clear to auscultation bilaterally. GI: Soft, nontender, non-distended  MS: No edema; No deformity. Neuro:  Nonfocal  Psych: Normal affect   Labs    Chemistry Recent Labs  Lab 07/15/18 1524  07/17/18 0424 07/18/18 0521 07/19/18 0429  NA 137   < > 136 136 135  K 4.2   < > 3.4* 4.5 4.4  CL 105   < > 106 109 108  CO2 25   < > 22 20* 23  GLUCOSE 118*   < > 157* 114* 108*  BUN 24*   < > 24* 25* 28*  CREATININE 1.17   < > 1.21 1.12 1.19  CALCIUM 9.0   < > 8.5* 8.5* 8.5*  PROT 7.7  --   --   --   --   ALBUMIN 3.6  --   --   --   --  AST 20  --   --   --   --   ALT 13  --   --   --   --   ALKPHOS 78  --   --   --   --   BILITOT 0.8  --   --   --   --   GFRNONAA 55*   < > 53* 58* 54*  GFRAA >60   < > >60 >60 >60  ANIONGAP 7   < > 8 7 4*   < > = values in this interval not displayed.     Hematology Recent Labs  Lab 07/17/18 0424 07/18/18 0521 07/19/18 0429  WBC 6.4 6.9 6.2  RBC 4.25 4.47 4.13*  HGB 12.9* 13.5 12.4*  HCT 39.5 41.9 38.4*  MCV 92.9 93.7 93.0  MCH 30.4 30.2 30.0  MCHC 32.7 32.2 32.3  RDW 13.7 13.6 13.8  PLT 149* 160 152    Cardiac Enzymes Recent Labs  Lab 07/15/18 1524  TROPONINI <0.03   No results for input(s): TROPIPOC in the last 168 hours.   BNP Recent Labs  Lab 07/15/18 1530  BNP 612.0*     DDimer No results for input(s): DDIMER in the last 168 hours.   Radiology    No results found.  Cardiac Studies   Relevant CV Studies: 12/05/2017 TTE Study Conclusions - Left ventricle: The cavity size was mildly dilated. Systolic function was severely reduced. The estimated ejection  fraction was in the range of 25% to 30%. Diffuse hypokinesis. Akinesis of the apical and peri-apical myocardium. The study is not technically sufficient to allow evaluation of LV diastolic function. - Mitral valve: There was moderate regurgitation. - Left atrium: The atrium was moderately to severely dilated. - Right ventricle: The cavity size was mildly dilated. Wall thickness was normal. Systolic function was mildly to moderately reduced. - Right atrium: The atrium was moderately dilated. - Tricuspid valve: There was moderate regurgitation directed eccentrically. - Pulmonary arteries: Systolic pressure was moderate to severely elevated. PA peak pressure: 61 mm Hg (S).   Patient Profile     Joseph Barton is a 86 y.o. male with a hx of with a hx of permanent Afib (Coumadin), HFrEF d/t ICM (EF 36%) and complicated by moderate-severe MR, CAD with remote PCI to LAD (2005), AAA (2005) s/p repair (2006), HLD (noted statin intolerance), HTN, neuropathy, and pulmonary nodules who is being seen today for the evaluation of Afib with RVR.  Assessment & Plan     Bronchopneumonia Improvement, no desaturations as was seen on admission On broad-spectrum antibiotics Nurses reported diaphoresis today with stable blood pressure no fever  Atrial fibrillation with RVR Poorly controlled rate on exertion Suspect this may be a chronic issue just undetected Significant nonsustained VT all this morning -Start on metoprolol succinate 25 daily, dose limited by hypotension -Start on amiodarone 400 twice daily oral dosing for rate and rhythm control  digoxin 0.0625 TWO pills daily alternating with 1 pill daily for rate control   Chronic systolic and diastolic CHF Ejection fraction 25 to 30% in June 2019 with moderate to severely elevated right heart pressures -Likely driving some of his elevated heart rate, also has significant MR Metoprolol succinate and amiodarone for rate  control ACE inhibitor currently held for hypotension  Mitral valve regurgitation He is indicated that he is not particularly interested in surgery at this time   CAD with Chronic stable angina Currently with no symptoms of angina. No further workup at  this time. Continue current medication regimen.    Total encounter time more than 25 minutes  Greater than 50% was spent in counseling and coordination of care with the patient   For questions or updates, please contact [place] Please consult [email or web address] for contact info under        Signed, Ida Rogue, MD  07/19/2018, 2:14 PM

## 2018-07-19 NOTE — Progress Notes (Signed)
[place] at [place] NAME: Joseph Barton    MR#:  [id number]  DATE OF BIRTH:  1927-01-17  SUBJECTIVE: Patient denies any cough, shortness of breath, feels stronger than admission.  CHIEF COMPLAINT:   Chief Complaint  Patient presents with  . Shortness of Breath  Patient seen and evaluated today Gets tachycardic whenever patient ambulates still No complaints of any shortness of breath No new episodes of hypoxia  REVIEW OF SYSTEMS:    ROS  CONSTITUTIONAL: No documented fever. Has fatigue, weakness. No weight gain, no weight loss.  EYES: No blurry or double vision.  ENT: No tinnitus. No postnasal drip. No redness of the oropharynx.  RESPIRATORY: Has cough, no wheeze, no hemoptysis. Decreased dyspnea.  CARDIOVASCULAR: No chest pain. No orthopnea. No palpitations. No syncope.  GASTROINTESTINAL: No nausea, no vomiting or diarrhea. No abdominal pain. No melena or hematochezia.  GENITOURINARY: No dysuria or hematuria.  ENDOCRINE: No polyuria or nocturia. No heat or cold intolerance.  HEMATOLOGY: No anemia. No bruising. No bleeding.  INTEGUMENTARY: No rashes. No lesions.  MUSCULOSKELETAL: No arthritis. No swelling. No gout.  NEUROLOGIC: No numbness, tingling, or ataxia. No seizure-type activity.  PSYCHIATRIC: No anxiety. No insomnia. No ADD.   DRUG ALLERGIES:   Allergies  Allergen Reactions  . Atorvastatin   . Simvastatin     VITALS:  Blood pressure (!) 113/98, pulse 65, temperature 98 F (36.7 C), temperature source Oral, resp. rate 20, height 6\' 1"  (1.854 m), weight 85.8 kg, SpO2 95 %.  PHYSICAL EXAMINATION:   Physical Exam  GENERAL:  85 y.o.-year-old patient lying in the bed with no acute distress.  EYES: Pupils equal, round, reactive to light  No scleral icterus. Extraocular muscles intact.  HEENT: Head atraumatic, normocephalic. Oropharynx and nasopharynx clear.  NECK:  Supple, no jugular venous distention. No thyroid enlargement,  no tenderness.  LUNGS:  clear to auscultation, no wheeze, no rales.Marland Kitchen  CARDIOVASCULAR: S1, S2 normal. No murmurs, rubs, or gallops.  Tachycardic on ambulation ABDOMEN: Soft, nontender, nondistended. Bowel sounds present. No organomegaly or mass.  EXTREMITIES: No cyanosis, clubbing or edema b/l.    NEUROLOGIC: Cranial nerves II through XII are intact. No focal Motor or sensory deficits b/l.   PSYCHIATRIC: The patient is alert and oriented x 3.  SKIN: No obvious rash, lesion, or ulcer.   LABORATORY PANEL:   CBC Recent Labs  Lab 07/19/18 0429  WBC 6.2  HGB 12.4*  HCT 38.4*  PLT 152   ------------------------------------------------------------------------------------------------------------------ Chemistries  Recent Labs  Lab 07/15/18 1524  07/17/18 0424  07/19/18 0429  NA 137   < > 136   < > 135  K 4.2   < > 3.4*   < > 4.4  CL 105   < > 106   < > 108  CO2 25   < > 22   < > 23  GLUCOSE 118*   < > 157*   < > 108*  BUN 24*   < > 24*   < > 28*  CREATININE 1.17   < > 1.21   < > 1.19  CALCIUM 9.0   < > 8.5*   < > 8.5*  MG  --   --  2.1  --   --   AST 20  --   --   --   --   ALT 13  --   --   --   --   ALKPHOS 78  --   --   --   --  BILITOT 0.8  --   --   --   --    < > = values in this interval not displayed.   ------------------------------------------------------------------------------------------------------------------  Cardiac Enzymes Recent Labs  Lab 07/15/18 1524  TROPONINI <0.03   ------------------------------------------------------------------------------------------------------------------  RADIOLOGY:  No results found.   ASSESSMENT AND PLAN:  85 year old male patient with history of coronary artery disease, PCI, congestive heart failure with systolic dysfunction, abdominal aortic aneurysm repair, hypertension, mitral regurgitation under hospitalist service for treatment of pneumonia  -Acute hypoxic respiratory distress secondary to pneumonia  improved Weaned off oxygen .,  Improved, no leukocytosis or fever .5 days of IV cefepime antibiotic course Antibiotics will be stopped And is on Tamiflu as a prophylaxis as wife diagnosed with flu.  Now he has no fever or throat pain, no reason to test him for flu.  -Chronic systolic heart failure and diastolic heart failure Ejection fraction of 25 to 30% in June 2019 with moderate to severely elevated right heart pressures Oral metoprolol and amiodarone for rate control ACE inhibitor on hold for hypotension  -Atrial fibrillation with RVR Poorly controlled rate on exertion Suspect this may be a chronic issue just undetected Significant nonsustained VT all this morning -Start on metoprolol succinate 25 daily, dose limited by hypotension -Start on amiodarone 400 twice daily oral dosing for rate and rhythm control  digoxin 0.0625 TWO pills daily alternating with 1 pill daily for rate control  -Coronary artery disease Medical management to continue  -Mitral valve regurgitation Patient indicated is not interested in surgery at this time  -Coronary artery disease with chronic stable angina No further ischemic work-up at this time  More than 50% time spent in counseling, coordination of care All the records are reviewed and case discussed with Care Management/Social Worker. Management plans discussed with the patient, family and they are in agreement.  CODE STATUS: DNR  DVT Prophylaxis: SCDs  TOTAL TIME TAKING CARE OF THIS PATIENT: 37  minutes.   POSSIBLE D/C IN 1 to 2 DAYS, DEPENDING ON CLINICAL CONDITION.  Saundra Shelling M.D on 07/19/2018 at 3:17 PM  Between 7am to 6pm - Pager - [phone]  After 6pm go to [email or web address] - password [email or web address]  Office  [phone]  CC: Primary care physician; Jerrol Banana., MD  Note: This dictation was prepared with Dragon dictation along with smaller phrase technology. Any transcriptional errors  that result from this process are unintentional.

## 2018-07-20 LAB — CBC
HCT: 44.5 % (ref 39.0–52.0)
HEMOGLOBIN: 14 g/dL (ref 13.0–17.0)
MCH: 30 pg (ref 26.0–34.0)
MCHC: 31.5 g/dL (ref 30.0–36.0)
MCV: 95.3 fL (ref 80.0–100.0)
Platelets: 164 10*3/uL (ref 150–400)
RBC: 4.67 MIL/uL (ref 4.22–5.81)
RDW: 13.8 % (ref 11.5–15.5)
WBC: 7.1 10*3/uL (ref 4.0–10.5)
nRBC: 0 % (ref 0.0–0.2)

## 2018-07-20 LAB — PROTIME-INR
INR: 2.1
Prothrombin Time: 23.3 seconds — ABNORMAL HIGH (ref 11.4–15.2)

## 2018-07-20 MED ORDER — OSELTAMIVIR PHOSPHATE 30 MG PO CAPS: 30.0000 mg | ORAL_CAPSULE | Freq: Every day | ORAL | 0 refills | Status: AC

## 2018-07-20 MED ORDER — AMIODARONE HCL 400 MG PO TABS: 400.0000 mg | ORAL_TABLET | Freq: Two times a day (BID) | ORAL | 0 refills | Status: DC

## 2018-07-20 MED ORDER — WARFARIN 1.25 MG HALF TABLET
1.2500 mg | ORAL_TABLET | Freq: Once | ORAL | Status: DC
  Filled 2018-07-20: qty 1

## 2018-07-20 MED ORDER — METOPROLOL SUCCINATE ER 25 MG PO TB24: 25.0000 mg | ORAL_TABLET | Freq: Every day | ORAL | 0 refills | Status: DC

## 2018-07-20 NOTE — Consult Note (Signed)
ANTICOAGULATION CONSULT NOTE - Follow Up Consult  Pharmacy Consult for Warfarin dosing Indication: atrial fibrillation  Allergies  Allergen Reactions  . Atorvastatin   . Simvastatin     Patient Measurements: Height: 6\' 1"  (185.4 cm) Weight: 185 lb 9.6 oz (84.2 kg) IBW/kg (Calculated) : 79.9   Vital Signs: Temp: 97.8 F (36.6 C) (01/18 0743) Temp Source: Oral (01/18 0743) BP: 113/69 (01/18 0743) Pulse Rate: 62 (01/18 0743)  Labs: Recent Labs    07/18/18 0521 07/19/18 0429 07/20/18 0427  HGB 13.5 12.4* 14.0  HCT 41.9 38.4* 44.5  PLT 160 152 164  LABPROT 27.2* 25.1* 23.3*  INR 2.57 2.31 2.10  CREATININE 1.12 1.19  --     Estimated Creatinine Clearance: 46.6 mL/min (by C-G formula based on SCr of 1.19 mg/dL).   Assessment: 85 y.o. male with a known history of CAD status post PCI, congestive heart failure with systolic dysfunction, hypertension, mitral regurgitation, abdominal aortic aneurysm repair presents to hospital secondary to worsening weakness, cough and shortness of breath.  Home dose: Per chart review patient followed by anticoag clinic with recent encounter notated to have dose of: Warfarin 2.5 mg every Sun,Tues,Thurs,Sat Warfarin 1.25 mg every MWF  1/13 INR 2.43 (baseline), Dosed 1.25mg  1/14 INR 2.35, Dosed 2.5mg  1/15 INR 2.73, Dosed 1.25mg  (pt did not receive) 1/16 INR 2.57, Dosed 2.5mg  1/17 INR 2.31, Dosed 1.25mg  1/18 INR 2.10     Goal of Therapy:  INR 2-3 Monitor platelets by anticoagulation protocol: Yes   Plan:  Current INR 2.10 Patient started on amiodarone on 1/17.  Will order Warfarin 1.25 mg (1/2 of 2.5 mg tab) x 1 again tonight due to DDI with amiodarone. Will draw INR daily while patient is on antibiotics.  Noralee Space, PharmD Clinical Pharmacist 07/20/2018 9:11 AM

## 2018-07-20 NOTE — Discharge Summary (Signed)
[place] at [place] NAME: Joseph Barton    MR#:  [id number]  DATE OF BIRTH:  [birth year]  DATE OF ADMISSION:  07/15/2018 ADMITTING PHYSICIAN: Gladstone Lighter, MD  DATE OF DISCHARGE: 07/20/2018  PRIMARY CARE PHYSICIAN: Jerrol Banana., MD   ADMISSION DIAGNOSIS:  Cough [R05] Sore throat [J02.9] Weight loss [R63.4] Hypoxia [R09.02] Decreased exercise tolerance [R68.89] Atrial fibrillation with RVR (HCC) [I48.91] Intermittent confusion [R41.0]  DISCHARGE DIAGNOSIS:  Active Problems:   Pneumonia   Malnutrition of moderate degree Right lung pneumonia Atrial fibrillation with rapid rate Chronic systolic heart failure Coronary artery disease  SECONDARY DIAGNOSIS:   Past Medical History:  Diagnosis Date  . Abdominal aortic aneurysm ([facility]) 01-2004   4.7 x 4.7 cm.  Marland Kitchen CAD (coronary artery disease) 01/25/2004   a. 01/2004 Ant MI/DES to LAD;  b. 10/2011 Neg MV.  . CHF (congestive heart failure) ([facility])   . Chronic systolic heart failure ([facility])    a. 08/2016 Echo: EF 35%, nl RV fxn, mod to sev MR, mild to mod TR, mod biatrial enlargement.  . Hyperlipidemia   . Hypertension   . Ischemic cardiomyopathy    a. 08/2016 Echo: EF 35%.  . Moderate to Severe Mitral regurgitation    a. 08/2016 Echo: Mod-sev MR.  Marland Kitchen Permanent atrial fibrillation    a. Chronic coumadin (CHA2DS2VASc = 5).  . Pulmonary nodules    Noted on abdominal CT  . Statin intolerance      ADMITTING HISTORY Joseph Barton  is a 85 y.o. male with a known history of CAD status post PCI, congestive heart failure with systolic dysfunction, hypertension, mitral regurgitation, abdominal aortic aneurysm repair presents to hospital secondary to worsening weakness, cough and shortness of breath.Most of the history is obtained from his daughter at bedside.  Apparently patient has had worsening congestion, postnasal drip and sinus infection and was treated with amoxicillin at the end of December.   After that he started having cough and chest x-ray as outpatient revealed right lower lobe pneumonia and was treated with Levaquin.  However his symptoms of dyspnea on exertion, cough have not been improved.  Today when he went for a follow-up visit, he was noted to be hypoxic and so redirected to the emergency room.  Family denies any fevers or chills.  He complains of extreme fatigue and weakness.  Also sore throat and pain when coughing.  His ambulatory sats dropped down into the 80s here in the emergency room.  He does not use home oxygen.  CT angiogram is done and pending.  HOSPITAL COURSE:  Patient was admitted to telemetry initially started on IV vancomycin and cefepime antibiotics.  Patient continued anticoagulation with Coumadin and INR was therapeutic.  Patient received incentive spirometry.  Patient's wife was suffering from flu and he was started on prophylactic dose of Tamiflu.  Patient continued oral digoxin and metoprolol for rate control.  Patient had atrial fibrillation with rapid rate and cardiology consultation was done.  His MRSA PCR is negative.  Group A strep Streptococcus by PCR was also negative.  Flu test was negative.  And was started on oral amiodarone for better rate control.  He will continue digoxin and metoprolol.  ACE inhibitor was held secondary to low blood pressure.  Patient tolerated IV antibiotics well and completed a course of antibiotics.  Her shortness of breath completely resolved.  Patient will be discharged home follow-up with electrophysiologist Dr. Caryl Barton in the clinic.  She will  continue rate control meds for now and continue anticoagulation with Coumadin.  CONSULTS OBTAINED:  Treatment Team:  Joseph Merritts, MD  DRUG ALLERGIES:   Allergies  Allergen Reactions  . Atorvastatin   . Simvastatin     DISCHARGE MEDICATIONS:   Allergies as of 07/20/2018      Reactions   Atorvastatin    Simvastatin       Medication List    STOP taking these  medications   levofloxacin 500 MG tablet Commonly known as:  LEVAQUIN     TAKE these medications   albuterol 108 (90 Base) MCG/ACT inhaler Commonly known as:  PROVENTIL HFA;VENTOLIN HFA Inhale 2 puffs into the lungs every 6 (six) hours as needed for wheezing or shortness of breath.   amiodarone 400 MG tablet Commonly known as:  PACERONE Take 1 tablet (400 mg total) by mouth 2 (two) times daily for 30 days.   BIOFLEX PO Take 1 tablet by mouth daily.   digoxin 0.125 MG tablet Commonly known as:  LANOXIN Take 0.0625 mg by mouth daily.   furosemide 20 MG tablet Commonly known as:  LASIX Take 40 mg by mouth daily.   HYDROcodone-homatropine 5-1.5 MG/5ML syrup Commonly known as:  HYCODAN Take 5 mLs by mouth at bedtime as needed for cough.   lisinopril 2.5 MG tablet Commonly known as:  PRINIVIL,ZESTRIL Take 2.5 mg by mouth daily.   metoprolol succinate 25 MG 24 hr tablet Commonly known as:  TOPROL-XL Take 1 tablet (25 mg total) by mouth daily for 30 days. What changed:  how much to take   omeprazole 20 MG capsule Commonly known as:  PRILOSEC TAKE 1 CAPSULE BY MOUTH  DAILY   oseltamivir 30 MG capsule Commonly known as:  TAMIFLU Take 1 capsule (30 mg total) by mouth daily for 1 day. What changed:    medication strength  how much to take   Potassium Chloride ER 20 MEQ Tbcr Take 20 mEq by mouth daily.   sacubitril-valsartan 24-26 MG Commonly known as:  ENTRESTO Take 1 tablet by mouth 2 (two) times daily.   warfarin 2.5 MG tablet Commonly known as:  COUMADIN Take as directed. If you are unsure how to take this medication, talk to your nurse or doctor. Original instructions:  Take 1 tablet (2.5 mg total) by mouth as directed. What changed:    how much to take  how to take this  when to take this  additional instructions       Today  Patient seen and evaluated today Heart rate is better controlled No shortness of breath No cough Hemodynamically  stable  VITAL SIGNS:  Blood pressure 113/69, pulse 62, temperature 97.8 F (36.6 C), temperature source Oral, resp. rate 18, height 6\' 1"  (1.854 m), weight 84.2 kg, SpO2 96 %.  I/O:  No intake or output data in the 24 hours ending 07/20/18 1230  PHYSICAL EXAMINATION:  Physical Exam  GENERAL:  85 y.o.-year-old patient lying in the bed with no acute distress.  LUNGS: Normal breath sounds bilaterally, no wheezing, rales,rhonchi or crepitation. No use of accessory muscles of respiration.  CARDIOVASCULAR: S1, S2 normal. No murmurs, rubs, or gallops.  ABDOMEN: Soft, non-tender, non-distended. Bowel sounds present. No organomegaly or mass.  NEUROLOGIC: Moves all 4 extremities. PSYCHIATRIC: The patient is alert and oriented x 3.  SKIN: No obvious rash, lesion, or ulcer.   DATA REVIEW:   CBC Recent Labs  Lab 07/20/18 0427  WBC 7.1  HGB 14.0  HCT 44.5  PLT 164    Chemistries  Recent Labs  Lab 07/15/18 1524  07/17/18 0424  07/19/18 0429  NA 137   < > 136   < > 135  K 4.2   < > 3.4*   < > 4.4  CL 105   < > 106   < > 108  CO2 25   < > 22   < > 23  GLUCOSE 118*   < > 157*   < > 108*  BUN 24*   < > 24*   < > 28*  CREATININE 1.17   < > 1.21   < > 1.19  CALCIUM 9.0   < > 8.5*   < > 8.5*  MG  --   --  2.1  --   --   AST 20  --   --   --   --   ALT 13  --   --   --   --   ALKPHOS 78  --   --   --   --   BILITOT 0.8  --   --   --   --    < > = values in this interval not displayed.    Cardiac Enzymes Recent Labs  Lab 07/15/18 1524  [place] <0.03    Microbiology Results  Results for orders placed or performed during the hospital encounter of 07/15/18  Group A Strep by PCR ([facility] Only)     Status: None   Collection Time: 07/15/18  6:24 PM  Result Value Ref Range Status   Group A Strep by PCR NOT DETECTED NOT DETECTED Final    Comment: Performed at [facility], [address] [place], [place] [address]  MRSA PCR Screening     Status: None   Collection Time:  07/16/18  5:05 AM  Result Value Ref Range Status   MRSA by PCR NEGATIVE NEGATIVE Final    Comment:        The GeneXpert MRSA Assay (FDA approved for NASAL specimens only), is one component of a comprehensive MRSA colonization surveillance program. It is not intended to diagnose MRSA infection nor to guide or monitor treatment for MRSA infections. Performed at [facility], [address], [place], [place] [address]     RADIOLOGY:  No results found.  Follow up with PCP in 1 week.  Management plans discussed with the patient, family and they are in agreement.  CODE STATUS: DNR    Code Status Orders  (From admission, onward)         Start     Ordered   07/16/18 1140  Do not attempt resuscitation (DNR)  Continuous    Question Answer Comment  In the event of cardiac or respiratory ARREST Do not call a "code blue"   In the event of cardiac or respiratory ARREST Do not perform Intubation, CPR, defibrillation or ACLS   In the event of cardiac or respiratory ARREST Use medication by any route, position, wound care, and other measures to relive pain and suffering. May use oxygen, suction and manual treatment of airway obstruction as needed for comfort.      07/16/18 1139        Code Status History    Date Active Date Inactive Code Status Order ID Comments User Context   07/16/2018 0451 07/16/2018 1139 Full Code [id number]  Gladstone Lighter, MD ED   12/04/2017 1517 12/06/2017 1916 Partial Code [id number]  Salary, Avel Peace, MD Inpatient  Advance Directive Documentation     Most Recent Value  Type of Advance Directive  Healthcare Power of Attorney, Living will  Pre-existing out of facility DNR order (yellow form or pink MOST form)  -  "MOST" Form in Place?  -      TOTAL TIME TAKING CARE OF THIS PATIENT ON DAY OF DISCHARGE: more than 34 minutes.   Joseph Barton M.D on 07/20/2018 at 12:30 PM  Between 7am to 6pm - Pager - [phone]  After 6pm go to [email or web address]  - password [email or web address]  Office  [phone]  CC: Primary care physician; Jerrol Banana., MD  Note: This dictation was prepared with Dragon dictation along with smaller phrase technology. Any transcriptional errors that result from this process are unintentional.

## 2018-07-20 NOTE — Progress Notes (Signed)
Joseph Barton to be D/C'd Home per MD order.  Discussed prescriptions and follow up appointments with the patient and daughter Prescriptions given to patient, medication list explained in detail. Pt verbalized understanding.  Allergies as of 07/20/2018      Reactions   Atorvastatin    Simvastatin       Medication List    STOP taking these medications   levofloxacin 500 MG tablet Commonly known as:  LEVAQUIN     TAKE these medications   albuterol 108 (90 Base) MCG/ACT inhaler Commonly known as:  PROVENTIL HFA;VENTOLIN HFA Inhale 2 puffs into the lungs every 6 (six) hours as needed for wheezing or shortness of breath.   amiodarone 400 MG tablet Commonly known as:  PACERONE Take 1 tablet (400 mg total) by mouth 2 (two) times daily for 30 days.   BIOFLEX PO Take 1 tablet by mouth daily.   digoxin 0.125 MG tablet Commonly known as:  LANOXIN Take 0.0625 mg by mouth daily.   furosemide 20 MG tablet Commonly known as:  LASIX Take 40 mg by mouth daily.   HYDROcodone-homatropine 5-1.5 MG/5ML syrup Commonly known as:  HYCODAN Take 5 mLs by mouth at bedtime as needed for cough.   lisinopril 2.5 MG tablet Commonly known as:  PRINIVIL,ZESTRIL Take 2.5 mg by mouth daily.   metoprolol succinate 25 MG 24 hr tablet Commonly known as:  TOPROL-XL Take 1 tablet (25 mg total) by mouth daily for 30 days. What changed:  how much to take   omeprazole 20 MG capsule Commonly known as:  PRILOSEC TAKE 1 CAPSULE BY MOUTH  DAILY   oseltamivir 30 MG capsule Commonly known as:  TAMIFLU Take 1 capsule (30 mg total) by mouth daily for 1 day. What changed:    medication strength  how much to take   Potassium Chloride ER 20 MEQ Tbcr Take 20 mEq by mouth daily.   sacubitril-valsartan 24-26 MG Commonly known as:  ENTRESTO Take 1 tablet by mouth 2 (two) times daily.   warfarin 2.5 MG tablet Commonly known as:  COUMADIN Take as directed. If you are unsure how to take this medication, talk  to your nurse or doctor. Original instructions:  Take 1 tablet (2.5 mg total) by mouth as directed. What changed:    how much to take  how to take this  when to take this  additional instructions       Vitals:   07/20/18 0412 07/20/18 0743  BP: 105/72 113/69  Pulse: 79 62  Resp:  18  Temp: 97.8 F (36.6 C) 97.8 F (36.6 C)  SpO2: 96% 96%    Skin clean, dry and intact without evidence of skin break down, no evidence of skin tears noted. IV catheter discontinued intact. Site without signs and symptoms of complications. Dressing and pressure applied. Pt denies pain at this time. No complaints noted.  An After Visit Summary was printed and given to the patient. Patient escorted via [place], and D/C home via private auto.  [place]

## 2018-07-22 ENCOUNTER — Telehealth: Payer: Self-pay

## 2018-07-22 NOTE — Telephone Encounter (Signed)
Transition Care Management Follow-up Telephone Call  Date of discharge and from where: [facility] on 07/20/18  How have you been since you were released from the hospital? Doing ok, feeling drowsy this AM but didn't sleep good last night. Was up 3-4 times during the night due to having to use the bathroom. Still coughing some. Declines pain, fever, SOB, weakness, sputum or n/v/d.  Any questions or concerns? No   Items Reviewed:  Did the pt receive and understand the discharge instructions provided? Yes   Medications obtained and verified? Reviewed new meds, pt declined reviewing all at this time.   Any new allergies since your discharge? No   Dietary orders reviewed? N/A  Do you have support at home? Yes   Other (ie: DME, Home Health, etc) N/A  Functional Questionnaire: (I = Independent and D = Dependent)  Bathing/Dressing- I   Meal Prep- I  Eating- I  Maintaining continence- I  Transferring/Ambulation- I  Managing Meds- Wife and daughter manages meds.   Follow up appointments reviewed:    PCP Hospital f/u appt confirmed? Yes  Scheduled to see Carles Collet on 07/26/18 @ 10:00 AM.  [facility] f/u appt confirmed? Yes   Are transportation arrangements needed? No   If their condition worsens, is the pt aware to call  their PCP or go to the ED? Yes  Was the patient provided with contact information for the PCP's office or ED? Yes  Was the pt encouraged to call back with questions or concerns? Yes

## 2018-07-22 NOTE — Telephone Encounter (Signed)
Noted, thank you

## 2018-07-23 ENCOUNTER — Ambulatory Visit: Payer: Self-pay

## 2018-07-26 ENCOUNTER — Encounter: Payer: Self-pay | Admitting: Physician Assistant

## 2018-07-26 ENCOUNTER — Ambulatory Visit (INDEPENDENT_AMBULATORY_CARE_PROVIDER_SITE_OTHER): Payer: Medicare Other | Admitting: Physician Assistant

## 2018-07-26 ENCOUNTER — Telehealth: Payer: Self-pay | Admitting: Internal Medicine

## 2018-07-26 VITALS — BP 111/70 | HR 67 | Temp 97.6°F | Wt 190.8 lb

## 2018-07-26 DIAGNOSIS — I4821 Permanent atrial fibrillation: Secondary | ICD-10-CM

## 2018-07-26 DIAGNOSIS — I5022 Chronic systolic (congestive) heart failure: Secondary | ICD-10-CM

## 2018-07-26 DIAGNOSIS — I10 Essential (primary) hypertension: Secondary | ICD-10-CM | POA: Diagnosis not present

## 2018-07-26 DIAGNOSIS — J181 Lobar pneumonia, unspecified organism: Secondary | ICD-10-CM | POA: Diagnosis not present

## 2018-07-26 DIAGNOSIS — J189 Pneumonia, unspecified organism: Secondary | ICD-10-CM

## 2018-07-26 LAB — POCT INR
INR: 2.3 (ref 2.0–3.0)
PT: 27.4

## 2018-07-26 LAB — PROTIME-INR

## 2018-07-26 NOTE — Progress Notes (Signed)
Patient: Joseph Barton Male    DOB: 1926/12/22   85 y.o.   MRN: [id number] Visit Date: 07/26/2018  Today's Provider: Trinna Post, PA-C   Chief Complaint  Patient presents with  . Hospitalization Follow-up   Subjective:    HPI  Follow up Hospitalization  Patient was admitted to [facility] on 07/16/2018 and discharged on 07/20/2018. He was treated for acute hypoxia-secondary to right upper lobe pneumonia . Treatment for this included chest x-ray, CT angio chest and IV antibiotics including vancomycin and cefepime.  Hospitalization was complicated by atrial fibrillation with RVR after which amiodarone infusion was started. Metorpolol was changed to coreg and entresto was changed to lisinopril. Prior to discharge, he was started back on metoprolol succinate 25 mg daily due to hypotension. He was started on amiodoraone 400 mg BID for rate and rhythm control. He was changed to digoxin 0.0625 two pills alternating with 1 pill daily for rate control.  Telephone follow up was done on 07/22/2018. He reports good compliance with treatment. He reports this condition is Improved.  Wt Readings from Last 3 Encounters:  07/26/18 190 lb 12.8 oz (86.5 kg)  07/20/18 185 lb 9.6 oz (84.2 kg)  07/15/18 186 lb (84.4 kg)   ------------------------------------------------------------------------------------   Patient daughter states that father is not sleeping since been discharge from the hospital.   Allergies  Allergen Reactions  . Atorvastatin   . Simvastatin      Current Outpatient Medications:  .  albuterol (PROVENTIL HFA;VENTOLIN HFA) 108 (90 Base) MCG/ACT inhaler, Inhale 2 puffs into the lungs every 6 (six) hours as needed for wheezing or shortness of breath., Disp: 1 Inhaler, Rfl: 0 .  amiodarone (PACERONE) 400 MG tablet, Take 1 tablet (400 mg total) by mouth 2 (two) times daily for 30 days., Disp: 60 tablet, Rfl: 0 .  Bioflavonoid Products (BIOFLEX PO), Take 1 tablet by mouth daily. ,  Disp: , Rfl:  .  digoxin (LANOXIN) 0.125 MG tablet, Take 0.0625 mg by mouth daily. , Disp: , Rfl:  .  furosemide (LASIX) 20 MG tablet, Take 40 mg by mouth daily., Disp: , Rfl:  .  omeprazole (PRILOSEC) 20 MG capsule, TAKE 1 CAPSULE BY MOUTH  DAILY, Disp: 90 capsule, Rfl: 3 .  warfarin (COUMADIN) 2.5 MG tablet, Take 1 tablet (2.5 mg total) by mouth as directed. (Patient taking differently: Take  tablet (1.25MG ) by mouth Monday, Wednesday, Friday and take 1 tablet (2.5MG ) by mouth Tuesday, Thursday, Saturday and Sunday), Disp: 30 tablet, Rfl: 12 .  metoprolol succinate (TOPROL-XL) 25 MG 24 hr tablet, Take 1 tablet (25 mg total) by mouth daily for 30 days. (Patient not taking: Reported on 07/26/2018), Disp: 30 tablet, Rfl: 0 .  potassium chloride 20 MEQ TBCR, Take 20 mEq by mouth daily., Disp: 60 tablet, Rfl: 1  Review of Systems  HENT: Negative.   Respiratory: Negative.   Genitourinary: Negative.   Neurological: Negative.     Social History   Tobacco Use  . Smoking status: Former Smoker    Last attempt to quit: 12/01/1987    Years since quitting: 30.6  . Smokeless tobacco: Never Used  Substance Use Topics  . Alcohol use: No      Objective:   BP 111/70 (BP Location: Left Arm, Patient Position: Sitting, Cuff Size: Normal)   Pulse 67   Temp 97.6 F (36.4 C) (Oral)   Wt 190 lb 12.8 oz (86.5 kg)   SpO2 99%   BMI  25.17 kg/m  Vitals:   07/26/18 1030  BP: 111/70  Pulse: 67  Temp: 97.6 F (36.4 C)  TempSrc: Oral  SpO2: 99%  Weight: 190 lb 12.8 oz (86.5 kg)     Physical Exam Constitutional:      General: He is not in acute distress.    Appearance: Normal appearance. He is not ill-appearing.  Cardiovascular:     Rate and Rhythm: Normal rate and regular rhythm.     Heart sounds: Normal heart sounds.  Pulmonary:     Effort: Pulmonary effort is normal.     Breath sounds: Normal breath sounds.  Skin:    General: Skin is warm and dry.  Neurological:     Mental Status: He is  alert.  Psychiatric:        Mood and Affect: Mood normal.        Behavior: Behavior normal.         Assessment & Plan    1. Pneumonia of right upper lobe due to infectious organism [facility])  Looks better today, oxygenation better and lung sounds are well. Counseled on follow up CXR in ~ 2 months to ensure resolution of pneumonia. He likely has some sleep disturbance from disrupted schedule of hospitalization and daytime sleeping. Encouraged to avoid napping during the day, can try 1 mg melatonin before bed, and avoid caffeine. Should return to normal after several weeks.   - DG Chest 2 View; Future  2. Permanent atrial fibrillation  INR 2.3 today, keep current dose of warfarin.   - Protime-INR - POCT INR  3. Essential hypertension  There has been some discrepancy with medications on discharge of patient. I have communicated with Dr. Rockey Situ who consulted on patient in the hospital. His final recommendation is to keep metorpolol succinate at current dose of 25 mg daily. Hold lisinopril and Entresto due to low normal blood pressure. As heart rate has come down, keep single pill daily of digoxin. This will be communicated to patient and family. I have reconciled patient's discharge medications with current medications.   4. Chronic systolic congestive heart failure ([place])  Return in about 2 months (around 09/24/2018) for cxr.  The entirety of the information documented in the History of Present Illness, Review of Systems and Physical Exam were personally obtained by me. Portions of this information were initially documented by [facility], CMA and reviewed by me for thoroughness and accuracy.           Trinna Post, PA-C  [facility]

## 2018-07-26 NOTE — Telephone Encounter (Signed)
Have communicated with Dr. Rockey Situ who consulted on patient in hospital. Instructed to keep daily SINGLE pill of digoxin as listed on discharge summary and to HOLD entresto due to low normal BP until follow up with Dr. Caryl Comes. He should not be taking Lisinopril either right now. Can we call patient's family and let him know.? Thank you.

## 2018-07-26 NOTE — Telephone Encounter (Signed)
Please call PA from [facility] to discuss pt medication, Digoxin.

## 2018-07-29 ENCOUNTER — Telehealth: Payer: Self-pay | Admitting: Family Medicine

## 2018-07-29 MED ORDER — TAMSULOSIN HCL 0.4 MG PO CAPS: 0.4000 mg | ORAL_CAPSULE | Freq: Every day | ORAL | 3 refills | Status: AC

## 2018-07-29 MED ORDER — POLYETHYLENE GLYCOL 3350 17 GM/SCOOP PO POWD: ORAL | 0 refills | Status: AC

## 2018-07-29 NOTE — Telephone Encounter (Signed)
Spoke with patient's wife, and she reports that the patient has had constipation ever since this morning. She reports that he has strained a lot, and now he has a hemorrhoid. She reports that the patient told her that his stomach feels full, and "aches". He describes it as feeling full of "Fluid". Denies fever or nausea and vomiting. Patient also reports that he has not been able to urinate today either. He reports that he has been drinking plenty of fluids, but he would like to know what can he take to help move along his bowels and his urine? Please advise. Thanks!

## 2018-07-29 NOTE — Telephone Encounter (Signed)
Patient advised as below. Patient verbalizes understanding and is in agreement with treatment plan.  

## 2018-07-29 NOTE — Telephone Encounter (Signed)
Glycolax daily OTC abd Tamulosin daily to help urine.

## 2018-07-29 NOTE — Telephone Encounter (Signed)
Pt is having difficulty urinating.  Pt is having pain and pressure abdominally because he hasn't been in a few hours.  Please call pt back to discuss.  Thanks, TGh

## 2018-07-30 ENCOUNTER — Encounter: Payer: Self-pay | Admitting: Internal Medicine

## 2018-07-30 ENCOUNTER — Ambulatory Visit: Payer: Medicare Other | Admitting: Internal Medicine

## 2018-07-30 VITALS — BP 100/58 | HR 71 | Ht 73.0 in | Wt 192.0 lb

## 2018-07-30 DIAGNOSIS — I255 Ischemic cardiomyopathy: Secondary | ICD-10-CM

## 2018-07-30 DIAGNOSIS — I340 Nonrheumatic mitral (valve) insufficiency: Secondary | ICD-10-CM | POA: Diagnosis not present

## 2018-07-30 DIAGNOSIS — I5022 Chronic systolic (congestive) heart failure: Secondary | ICD-10-CM

## 2018-07-30 DIAGNOSIS — I4821 Permanent atrial fibrillation: Secondary | ICD-10-CM | POA: Diagnosis not present

## 2018-07-30 NOTE — Patient Instructions (Signed)
Medication Instructions:  - Your physician recommends that you continue on your current medications as directed. Please refer to the Current Medication list given to you today.  If you need a refill on your cardiac medications before your next appointment, please call your pharmacy.   Lab work: - none ordered   If you have labs (blood work) drawn today and your tests are completely normal, you will receive your results only by: Marland Kitchen MyChart Message (if you have MyChart) OR . A paper copy in the mail If you have any lab test that is abnormal or we need to change your treatment, we will call you to review the results.  Testing/Procedures: - none ordered  Follow-Up: At [facility], you and your health needs are our priority.  As part of our continuing mission to provide you with exceptional heart care, we have created designated Provider Care Teams.  These Care Teams include your primary Cardiologist (physician) and Advanced Practice Providers (APPs -  Physician Assistants and Nurse Practitioners) who all work together to provide you with the care you need, when you need it. . You will need a follow up appointment in 6 months with Dr. Caryl Comes.  Please call our office 2 months in advance to schedule this appointment.   Any Other Special Instructions Will Be Listed Below (If Applicable). - N/A

## 2018-07-30 NOTE — Progress Notes (Signed)
Patient Care Team: Jerrol Banana., MD as PCP - General (Family Medicine) Lorelee Cover., MD as Consulting Physician (Ophthalmology) Deboraha Sprang, MD as Consulting Physician (Cardiology)   HPI  Joseph Barton is a 85 y.o. male Seen in follow-up for permanent atrial fibrillation congestive heart failure in the setting of mitral regurgitation moderate-severe as well as known ischemic heart disease with a negative Myoview 2013 and remote stenting.      DATE TEST EF   4/13 Myoview  13 %   08/2016 Echo   35 % MR mod-sev//LAE mod  6/19 Echo  25% MR Mod eccentric       Date Cr K DIG Hgb  3/18 1.4  1.3 (12/17) 15.2  6/18 1.03 4.6    10/18   1.1   5/19 1.24 4.7  13.8  8/19 1.28 4.4 0.6   1/20 1.19 4.4 0.8 14.0   He continues to struggle with dyspnea.  He was hospitalized 1/20 with pneumonia.  He has remained worse since discharge.  He had significant mitral regurgitation and had been offered MitraClip evaluation which he declined.  Past Medical History:  Diagnosis Date  . Abdominal aortic aneurysm ([place]) 01-2004   4.7 x 4.7 cm.  Marland Kitchen CAD (coronary artery disease) 01/25/2004   a. 01/2004 Ant MI/DES to LAD;  b. 10/2011 Neg MV.  . CHF (congestive heart failure) ([place])   . Chronic systolic heart failure ([place])    a. 08/2016 Echo: EF 35%, nl RV fxn, mod to sev MR, mild to mod TR, mod biatrial enlargement.  . Hyperlipidemia   . Hypertension   . Ischemic cardiomyopathy    a. 08/2016 Echo: EF 35%.  . Moderate to Severe Mitral regurgitation    a. 08/2016 Echo: Mod-sev MR.  Marland Kitchen Permanent atrial fibrillation    a. Chronic coumadin (CHA2DS2VASc = 5).  . Pulmonary nodules    Noted on abdominal CT  . Statin intolerance     Past Surgical History:  Procedure Laterality Date  . ABDOMINAL AORTIC ANEURYSM REPAIR     12/02/2007 [facility]  . ABDOMINAL AORTIC ANEURYSM REPAIR  2006   [facility]   . CARDIAC CATHETERIZATION  2009   [facility]  . CORONARY ANGIOPLASTY WITH STENT  PLACEMENT  2005   Cypher stent LAD   . Cypher stents to LAD     01/25/2004  . SKIN SURGERY  2016   [facility] skin cancer    Current Meds  Medication Sig  . albuterol (PROVENTIL HFA;VENTOLIN HFA) 108 (90 Base) MCG/ACT inhaler Inhale 2 puffs into the lungs every 6 (six) hours as needed for wheezing or shortness of breath.  Marland Kitchen amiodarone (PACERONE) 400 MG tablet Take 1 tablet (400 mg total) by mouth 2 (two) times daily for 30 days.  Marland Kitchen Bioflavonoid Products (BIOFLEX PO) Take 1 tablet by mouth daily.   . digoxin (LANOXIN) 0.125 MG tablet Take 0.0625 mg by mouth daily.   . furosemide (LASIX) 20 MG tablet Take 40 mg by mouth daily.  Marland Kitchen lisinopril (PRINIVIL,ZESTRIL) 2.5 MG tablet Take 2.5 mg by mouth daily.  . metoprolol succinate (TOPROL-XL) 25 MG 24 hr tablet Take 1 tablet (25 mg total) by mouth daily for 30 days.  Marland Kitchen omeprazole (PRILOSEC) 20 MG capsule TAKE 1 CAPSULE BY MOUTH  DAILY  . polyethylene glycol powder (GLYCOLAX) powder Take 1 scoop by mouth daily.  . potassium chloride 20 MEQ TBCR Take 20 mEq by mouth daily.  . tamsulosin (FLOMAX)  0.4 MG CAPS capsule Take 1 capsule (0.4 mg total) by mouth daily.  Marland Kitchen warfarin (COUMADIN) 2.5 MG tablet Take 1 tablet (2.5 mg total) by mouth as directed. (Patient taking differently: Take  tablet (1.25MG ) by mouth Monday, Wednesday, Friday and take 1 tablet (2.5MG ) by mouth Tuesday, Thursday, Saturday and Sunday)    Allergies  Allergen Reactions  . Atorvastatin   . Simvastatin       Review of Systems negative except from HPI and PMH  Physical Exam BP (!) 100/58 (BP Location: Left Arm, Patient Position: Sitting, Cuff Size: Normal)   Pulse 71   Ht 6\' 1"  (1.854 m)   Wt 192 lb (87.1 kg)   BMI 25.33 kg/m  Well developed and nourished in no acute distress HENT normal Neck supple with JVP-8-10 Clear Regular rate and rhythm, 3/6 systolic to back  Abd-soft with active BS No Clubbing cyanosis tr edema Skin-warm and dry A & Oriented  Grossly normal  sensory and motor function   ECG atrial fibrillation at 71 Intervals-/16/45 IVCD  Assessment and  Plan  Atrial fibrillation-permanent  Mitral regurgitation-moderate-severe  Chronic systolic heart failure  IVCD  Ischemic heart disease with prior stenting  Congestive heart failure-acute on chronic  End of life discussions   Euvolemic.  Dyspnea likely residual from his pneumonia as well as his MR.  AF is permanent.  We discussed end-of-life decision-making as relates to ongoing procedures.  He remains disinclined to pursue it; his daughter had wished he had.  Have encouraged him to have discussions together and recommended [facility] next We will continue his current medications.  We spent more than 50% of our >25 min visit in face to face counseling regarding the above

## 2018-07-30 NOTE — Telephone Encounter (Signed)
Medication was sent into the pharmacy on 07/29/2018.

## 2018-07-31 NOTE — Addendum Note (Signed)
Addended by: Anselm Pancoast on: 07/31/2018 09:25 AM   Modules accepted: Orders

## 2018-08-06 ENCOUNTER — Ambulatory Visit: Payer: Self-pay

## 2018-08-09 ENCOUNTER — Other Ambulatory Visit: Payer: Self-pay | Admitting: Internal Medicine

## 2018-08-09 NOTE — Telephone Encounter (Signed)
This is a [place] pt 

## 2018-08-19 ENCOUNTER — Telehealth: Payer: Self-pay | Admitting: Internal Medicine

## 2018-08-19 MED ORDER — AMIODARONE HCL 200 MG PO TABS: ORAL_TABLET | ORAL | 1 refills | Status: DC

## 2018-08-19 NOTE — Telephone Encounter (Signed)
I spoke with Mrs. Sermon (ok per [facility]). I have advised her that per Dr. Caryl Comes, the patient will need to decrease amiodarone down to 200 mg once daily.   I have advised her I will update this at the pharmacy. I have also advised her to please have the patient notify [facility] of the dose change when he goes for his INR check tomorrow.  Mrs. Daywalt voices understanding.

## 2018-08-19 NOTE — Telephone Encounter (Signed)
Oh my I am sorry  Lets place him on 200 daily  Thanks

## 2018-08-19 NOTE — Telephone Encounter (Signed)
Patient wife calling to check on response from Dr. Caryl Comes.  Please call.

## 2018-08-19 NOTE — Telephone Encounter (Signed)
Reviewed the patient's chart. He was placed on amiodarone 400 mg BID x 30 days at hospital discharge on 07/20/18. The patient was seen in clinic on 07/30/18 with Dr. Caryl Comes - no medication adjustments made at that. Will review with Dr. Caryl Comes to see what dose of amiodarone the patient should currently be taking.   INR checked on 07/26/18- 2.3  He is scheduled for a repeat INR on 08/20/18 at [facility].

## 2018-08-19 NOTE — Telephone Encounter (Signed)
Please call to discuss Amiodarone. Pt has 2 left, and wife is unsure if he should get it refilled.

## 2018-08-19 NOTE — Telephone Encounter (Signed)
Patient wife Zella returning call  Would really like to speak with someone before the end of day

## 2018-08-19 NOTE — Telephone Encounter (Signed)
Spoke with patient's wife, ok per DPR. Patient has been on Amiodarone 400 mg by  Mouth two times a day since discharge from the hospital on 07/20/18. He was given a 30-day supply and will take the last dose this evening.  Then he will be out starting tomorrow. Wife would like to know if he is to continue the medication. Patient saw Dr Caryl Comes on 07/30/18, no medication changes made at that time.  Need to clarify with Dr Caryl Comes ok to refill amiodarone at 400 mg by mouth two times a day. Routing to Dr Caryl Comes and Nira Conn.

## 2018-08-20 ENCOUNTER — Ambulatory Visit (INDEPENDENT_AMBULATORY_CARE_PROVIDER_SITE_OTHER): Payer: Medicare Other

## 2018-08-20 DIAGNOSIS — I482 Chronic atrial fibrillation, unspecified: Secondary | ICD-10-CM

## 2018-08-20 LAB — POCT INR
INR: 6 — AB (ref 2.0–3.0)
PT: 72.2

## 2018-08-20 NOTE — Patient Instructions (Signed)
Description   Current dose:2.5mg  daily except 1.25mg  Monday, Wednesday  and Friday. F/U 2 weeks Changes: Hold medication. Follow-up on Friday.08/23/18

## 2018-08-21 ENCOUNTER — Telehealth: Payer: Self-pay | Admitting: Family Medicine

## 2018-08-21 NOTE — Telephone Encounter (Signed)
I called the patient to schedule AWV with [facility].  There was no answer and no option to leave a message

## 2018-08-22 ENCOUNTER — Ambulatory Visit (INDEPENDENT_AMBULATORY_CARE_PROVIDER_SITE_OTHER): Payer: Medicare Other | Admitting: Family Medicine

## 2018-08-22 ENCOUNTER — Emergency Department
Admission: EM | Admit: 2018-08-22 | Discharge: 2018-08-22 | Disposition: A | Discharge status: Home / Self Care | Payer: Medicare Other | Attending: Emergency Medicine | Admitting: Emergency Medicine

## 2018-08-22 DIAGNOSIS — I2510 Atherosclerotic heart disease of native coronary artery without angina pectoris: Secondary | ICD-10-CM | POA: Diagnosis not present

## 2018-08-22 DIAGNOSIS — Z955 Presence of coronary angioplasty implant and graft: Secondary | ICD-10-CM | POA: Insufficient documentation

## 2018-08-22 DIAGNOSIS — I482 Chronic atrial fibrillation, unspecified: Secondary | ICD-10-CM

## 2018-08-22 DIAGNOSIS — I252 Old myocardial infarction: Secondary | ICD-10-CM | POA: Diagnosis not present

## 2018-08-22 DIAGNOSIS — I5022 Chronic systolic (congestive) heart failure: Secondary | ICD-10-CM | POA: Diagnosis not present

## 2018-08-22 DIAGNOSIS — Z87891 Personal history of nicotine dependence: Secondary | ICD-10-CM | POA: Insufficient documentation

## 2018-08-22 DIAGNOSIS — Z5181 Encounter for therapeutic drug level monitoring: Secondary | ICD-10-CM | POA: Diagnosis present

## 2018-08-22 DIAGNOSIS — Z85828 Personal history of other malignant neoplasm of skin: Secondary | ICD-10-CM | POA: Diagnosis not present

## 2018-08-22 DIAGNOSIS — Z79899 Other long term (current) drug therapy: Secondary | ICD-10-CM | POA: Diagnosis not present

## 2018-08-22 DIAGNOSIS — Z0000 Encounter for general adult medical examination without abnormal findings: Secondary | ICD-10-CM | POA: Insufficient documentation

## 2018-08-22 DIAGNOSIS — Z7901 Long term (current) use of anticoagulants: Secondary | ICD-10-CM | POA: Diagnosis not present

## 2018-08-22 DIAGNOSIS — I110 Hypertensive heart disease with heart failure: Secondary | ICD-10-CM | POA: Insufficient documentation

## 2018-08-22 DIAGNOSIS — I4891 Unspecified atrial fibrillation: Secondary | ICD-10-CM | POA: Diagnosis not present

## 2018-08-22 LAB — CBC
HCT: 44.1 % (ref 39.0–52.0)
Hemoglobin: 14.1 g/dL (ref 13.0–17.0)
MCH: 30.7 pg (ref 26.0–34.0)
MCHC: 32 g/dL (ref 30.0–36.0)
MCV: 95.9 fL (ref 80.0–100.0)
Platelets: 174 10*3/uL (ref 150–400)
RBC: 4.6 MIL/uL (ref 4.22–5.81)
RDW: 16.1 % — ABNORMAL HIGH (ref 11.5–15.5)
WBC: 5.6 10*3/uL (ref 4.0–10.5)
nRBC: 0 % (ref 0.0–0.2)

## 2018-08-22 LAB — BASIC METABOLIC PANEL
Anion gap: 8 (ref 5–15)
BUN: 25 mg/dL — ABNORMAL HIGH (ref 8–23)
CALCIUM: 8.8 mg/dL — AB (ref 8.9–10.3)
CO2: 27 mmol/L (ref 22–32)
Chloride: 101 mmol/L (ref 98–111)
Creatinine, Ser: 1.36 mg/dL — ABNORMAL HIGH (ref 0.61–1.24)
GFR calc Af Amer: 53 mL/min — ABNORMAL LOW (ref >60)
GFR calc non Af Amer: 45 mL/min — ABNORMAL LOW (ref >60)
Glucose, Bld: 96 mg/dL (ref 70–99)
Potassium: 4.2 mmol/L (ref 3.5–5.1)
Sodium: 136 mmol/L (ref 135–145)

## 2018-08-22 LAB — APTT: aPTT: 49 seconds — ABNORMAL HIGH (ref 24–36)

## 2018-08-22 LAB — PROTIME-INR
INR: 3.42
Prothrombin Time: 34 seconds — ABNORMAL HIGH (ref 11.4–15.2)

## 2018-08-22 LAB — POCT INR
INR: 10 — AB (ref 2.0–3.0)
PT: 58

## 2018-08-22 NOTE — ED Notes (Signed)
ED Provider at bedside. 

## 2018-08-22 NOTE — Progress Notes (Signed)
INR 3 days ago was 6.  Patient instructed to stop Coumadin and he states he has done so.  INR checked twice today is 10.  Daughter is brought back and patient is sent directly to the ED.  I called [facility] ED to let them know he is coming.

## 2018-08-22 NOTE — ED Notes (Signed)
Pt has been off of his coumadin since Tuesday.

## 2018-08-22 NOTE — ED Triage Notes (Signed)
Pt sent here from Dr office states "my coumadin level is 10." Appears in NAD, denies pain.

## 2018-08-22 NOTE — Patient Instructions (Signed)
Rechecked PT/INR after having patient hold medication since 08/20/2018. INR was 10.0 today, rechecked a second time INR still at 10.0. Patient was sent to ER by Dr. Rosanna Randy.

## 2018-08-22 NOTE — ED Notes (Signed)
Per request of lab re-draw. Sent rainbow to lab.

## 2018-08-22 NOTE — ED Provider Notes (Signed)
[facility] Emergency Department Provider Note   ____________________________________________   First MD Initiated Contact with Patient 08/22/18 1153     (approximate)  I have reviewed the triage vital signs and the nursing notes.   HISTORY  Chief Complaint Labwork    HPI Joseph Barton is a 85 y.o. male was sent from his doctor's office for Coumadin level of 10.  He denies any complaints he has no bleeding or chest pain or shortness of breath or anything else.   Past Medical History:  Diagnosis Date  . Abdominal aortic aneurysm ([place]) 01-2004   4.7 x 4.7 cm.  Marland Kitchen CAD (coronary artery disease) 01/25/2004   a. 01/2004 Ant MI/DES to LAD;  b. 10/2011 Neg MV.  . CHF (congestive heart failure) ([place])   . Chronic systolic heart failure ([place])    a. 08/2016 Echo: EF 35%, nl RV fxn, mod to sev MR, mild to mod TR, mod biatrial enlargement.  . Hyperlipidemia   . Hypertension   . Ischemic cardiomyopathy    a. 08/2016 Echo: EF 35%.  . Moderate to Severe Mitral regurgitation    a. 08/2016 Echo: Mod-sev MR.  Marland Kitchen Permanent atrial fibrillation    a. Chronic coumadin (CHA2DS2VASc = 5).  . Pulmonary nodules    Noted on abdominal CT  . Statin intolerance     Patient Active Problem List   Diagnosis Date Noted  . Malnutrition of moderate degree 07/18/2018  . Pneumonia 07/15/2018  . Hypotension 12/12/2017  . CHF (congestive heart failure) ([place]) Sep 22, 202019  . Subclinical hypothyroidism 10/06/2015  . Allergic rhinitis 09/10/2015  . Bradycardia 09/10/2015  . Failure of erection 09/10/2015  . Blood glucose elevated 09/10/2015  . BP (high blood pressure) 09/10/2015  . Neuropathy 09/10/2015  . Apnea, sleep 09/10/2015  . Cancer of skin, squamous cell 09/10/2015  . Acid reflux 04/28/2015  . Arthritis, degenerative 12/25/2014  . Cardiomyopathy, ischemic 09/27/2013  . HLD (hyperlipidemia) 09/27/2013  . Lung nodule, multiple 09/27/2013  . Drug intolerance 09/27/2013  .  Ischemic cardiomyopathy 12/01/2011  . Atrial fibrillation ([place]) 12/01/2011  . Chronic systolic heart failure ([place]) 12/01/2011  . IVCD (intraventricular conduction defect) 12/01/2011  . Intraventricular block 12/01/2011  . 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 CAD S/P percutaneous coronary angioplasty 01/25/2004  . Myocardial infarction ([place]) 01/25/2004  . AAA (abdominal aortic aneurysm) ([place]) 01/01/2004    Past Surgical History:  Procedure Laterality Date  . ABDOMINAL AORTIC ANEURYSM REPAIR     12/02/2007 [facility]  . ABDOMINAL AORTIC ANEURYSM REPAIR  2006   [facility]   . CARDIAC CATHETERIZATION  2009   [facility]  . CORONARY ANGIOPLASTY WITH STENT PLACEMENT  2005   Cypher stent LAD   . Cypher stents to LAD     01/25/2004  . SKIN SURGERY  2016   [facility] skin cancer    Prior to Admission medications   Medication Sig Start Date End Date Taking? Authorizing Provider  albuterol (PROVENTIL HFA;VENTOLIN HFA) 108 (90 Base) MCG/ACT inhaler Inhale 2 puffs into the lungs every 6 (six) hours as needed for wheezing or shortness of breath. 11/29/17   Jerrol Banana., MD  amiodarone (PACERONE) 200 MG tablet Take 1 tablet (200 mg) by mouth once daily 08/19/18   Deboraha Sprang, MD  Bioflavonoid Products (BIOFLEX PO) Take 1 tablet by mouth daily.     [provider]  digoxin (LANOXIN) 0.125 MG tablet Take 0.0625 mg by mouth daily.  [provider]  furosemide (LASIX) 20 MG tablet Take 40 mg by mouth daily.    [provider]  lisinopril (PRINIVIL,ZESTRIL) 2.5 MG tablet TAKE 1 TABLET BY MOUTH  DAILY 08/09/18   Deboraha Sprang, MD  metoprolol succinate (TOPROL-XL) 25 MG 24 hr tablet TAKE ONE-HALF TABLET BY  MOUTH DAILY 08/09/18   Deboraha Sprang, MD  omeprazole (PRILOSEC) 20 MG capsule TAKE 1 CAPSULE BY MOUTH  DAILY 02/16/18   Jerrol Banana., MD  polyethylene glycol powder [facility]) powder Take 1 scoop by mouth daily. 07/29/18   Jerrol Banana., MD  potassium chloride 20 MEQ TBCR Take 20 mEq by mouth daily. 12/06/17 07/30/18  Henreitta Leber, MD  tamsulosin (FLOMAX) 0.4 MG CAPS capsule Take 1 capsule (0.4 mg total) by mouth daily. 07/29/18   Jerrol Banana., MD  warfarin (COUMADIN) 2.5 MG tablet Take 1 tablet (2.5 mg total) by mouth as directed. Patient taking differently: Take  tablet (1.25MG ) by mouth Monday, Wednesday, Friday and take 1 tablet (2.5MG ) by mouth Tuesday, Thursday, Saturday and Sunday 10/03/17   Jerrol Banana., MD    Allergies Atorvastatin and Simvastatin  Family History  Problem Relation Age of Onset  . Heart attack Father 33       Died w/ Heart attack  . Diabetes Father   . Heart attack Brother   . Heart disease Brother   . Diabetes Sister   . Heart disease Sister     Social History Social History   Tobacco Use  . Smoking status: Former Smoker    Last attempt to quit: 12/01/1987    Years since quitting: 30.7  . Smokeless tobacco: Never Used  Substance Use Topics  . Alcohol use: No  . Drug use: No    Review of Systems  Constitutional: No fever/chills Eyes: No visual changes. ENT: No sore throat. Cardiovascular: Denies chest pain. Respiratory: Denies shortness of breath. Gastrointestinal: No abdominal pain.  No nausea, no vomiting.  No diarrhea.  No constipation. Genitourinary: Negative for dysuria. Musculoskeletal: Negative for back pain. Skin: Negative for rash. Neurological: Negative for headaches, focal weakness   ____________________________________________   PHYSICAL EXAM:  VITAL SIGNS: ED Triage Vitals  Enc Vitals Group     BP 08/22/18 1125 (P) 94/73     Pulse Rate 08/22/18 1125 (!) (P) 55     Resp --      Temp 08/22/18 1125 (P) 98.4 F (36.9 C)     Temp Source 08/22/18 1125 (P) Oral     SpO2 08/22/18 1125 (P) 97 %     Weight --      Height --      Head Circumference --      Peak Flow --      Pain Score 08/22/18 1155 0     Pain Loc --      Pain Edu? --       Excl. in [place]? --     Constitutional: Alert and oriented. Well appearing and in no acute distress. Eyes: Conjunctivae are normal. Head: Atraumatic. Nose: No congestion/rhinnorhea. Mouth/Throat: Mucous membranes are moist.  Oropharynx non-erythematous. Neck: No stridor.   Cardiovascular: Normal rate, irregular rhythm. Grossly normal heart sounds.  Good peripheral circulation. Respiratory: Normal respiratory effort.  No retractions. Lungs CTAB. Gastrointestinal: Soft and nontender. No distention. No abdominal bruits. No CVA tenderness. }Musculoskeletal: No lower extremity tenderness nor edema.  Neurologic:  Normal speech and language. No gross focal neurologic deficits  are appreciated.  Skin:  Skin is warm, dry and intact. No rash noted. Psychiatric: Mood and affect are normal. Speech and behavior are normal.  ____________________________________________   LABS (all labs ordered are listed, but only abnormal results are displayed)  Labs Reviewed  CBC - Abnormal; Notable for the following components:      Result Value   RDW 16.1 (*)    All other components within normal limits  BASIC METABOLIC PANEL - Abnormal; Notable for the following components:   BUN 25 (*)    Creatinine, Ser 1.36 (*)    Calcium 8.8 (*)    GFR calc non Af Amer 45 (*)    GFR calc Af Amer 53 (*)    All other components within normal limits  PROTIME-INR - Abnormal; Notable for the following components:   Prothrombin Time 34.0 (*)    All other components within normal limits  APTT - Abnormal; Notable for the following components:   aPTT 49 (*)    All other components within normal limits   ____________________________________________  EKG  EKG read and interpreted by me shows A. fib at a rate of 54 left axis right bundle branch block also left anterior hemiblock ____________________________________________  RADIOLOGY  ED MD interpretation:   Official radiology report(s): No results  found.  ____________________________________________   PROCEDURES  Procedure(s) performed:  Procedures  Critical Care performed:  ____________________________________________   INITIAL IMPRESSION / ASSESSMENT AND PLAN / ED COURSE   Patient's PT/INR is just slightly above normal 3.42.  We will have him hold his Coumadin for day recheck check with his doctor tomorrow.         ____________________________________________   FINAL CLINICAL IMPRESSION(S) / ED DIAGNOSES  Final diagnoses:  Normal exam     ED Discharge Orders    None       Note:  This document was prepared using Dragon voice recognition software and may include unintentional dictation errors.    Nena Polio, MD 08/22/18 2133

## 2018-08-22 NOTE — Discharge Instructions (Addendum)
Your Coumadin level today here was 3.42.  This is just slightly high.  Please do not take your Coumadin today.  Start your Coumadin again tomorrow follow-up with your doctor again tomorrow for another recheck to see how you are doing.  Right now you do not need any vitamin K or anything else.

## 2018-08-22 NOTE — ED Notes (Signed)
Pt denies any active bleeding

## 2018-08-22 NOTE — ED Notes (Signed)
Pt provided drink at this time

## 2018-08-23 ENCOUNTER — Encounter: Payer: Self-pay | Admitting: Physician Assistant

## 2018-08-23 ENCOUNTER — Ambulatory Visit (INDEPENDENT_AMBULATORY_CARE_PROVIDER_SITE_OTHER): Payer: Medicare Other

## 2018-08-23 ENCOUNTER — Ambulatory Visit: Payer: Self-pay | Admitting: Physician Assistant

## 2018-08-23 DIAGNOSIS — I482 Chronic atrial fibrillation, unspecified: Secondary | ICD-10-CM

## 2018-08-23 LAB — POCT INR
INR: 3.7 — AB (ref 2.0–3.0)
PT: 44.1

## 2018-08-23 NOTE — Progress Notes (Deleted)
       Patient: Joseph Barton Male    DOB: Nov 24, 1926   85 y.o.   MRN: [id number] Visit Date: 08/23/2018  Today's Provider: Mar Daring, PA-C   Chief Complaint  Patient presents with  . Follow-up    ER visit for coumadin level   Subjective:     HPI   Follow Up ER Visit  Patient is here for ER follow up.  He was recently seen at [facility] for Coumadin Level of 10 on 08/22/2018. Treatment for this included EKG:A.Fib at a reate of 54 left axis right bundle brunch block also left anterior hemiblock. He reports excellent compliance with treatment. He reports this condition is Improved. INR level yesterday at the ED was 3.42 and PT:34.0. patient was asked to hold dosage and repeat Pt/INR again at the office visit.  ------------------------------------------------------------------------------------   Allergies  Allergen Reactions  . Atorvastatin   . Simvastatin      Current Outpatient Medications:  .  albuterol (PROVENTIL HFA;VENTOLIN HFA) 108 (90 Base) MCG/ACT inhaler, Inhale 2 puffs into the lungs every 6 (six) hours as needed for wheezing or shortness of breath., Disp: 1 Inhaler, Rfl: 0 .  amiodarone (PACERONE) 200 MG tablet, Take 1 tablet (200 mg) by mouth once daily, Disp: 90 tablet, Rfl: 1 .  Bioflavonoid Products (BIOFLEX PO), Take 1 tablet by mouth daily. , Disp: , Rfl:  .  digoxin (LANOXIN) 0.125 MG tablet, Take 0.0625 mg by mouth daily. , Disp: , Rfl:  .  furosemide (LASIX) 20 MG tablet, Take 40 mg by mouth daily., Disp: , Rfl:  .  lisinopril (PRINIVIL,ZESTRIL) 2.5 MG tablet, TAKE 1 TABLET BY MOUTH  DAILY, Disp: 90 tablet, Rfl: 3 .  metoprolol succinate (TOPROL-XL) 25 MG 24 hr tablet, TAKE ONE-HALF TABLET BY  MOUTH DAILY, Disp: 45 tablet, Rfl: 3 .  omeprazole (PRILOSEC) 20 MG capsule, TAKE 1 CAPSULE BY MOUTH  DAILY, Disp: 90 capsule, Rfl: 3 .  polyethylene glycol powder (GLYCOLAX) powder, Take 1 scoop by mouth daily., Disp: 255 g, Rfl: 0 .  tamsulosin (FLOMAX) 0.4 MG  CAPS capsule, Take 1 capsule (0.4 mg total) by mouth daily., Disp: 30 capsule, Rfl: 3 .  potassium chloride 20 MEQ TBCR, Take 20 mEq by mouth daily., Disp: 60 tablet, Rfl: 1 .  warfarin (COUMADIN) 2.5 MG tablet, Take 1 tablet (2.5 mg total) by mouth as directed. (Patient taking differently: Take  tablet (1.25MG ) by mouth Monday, Wednesday, Friday and take 1 tablet (2.5MG ) by mouth Tuesday, Thursday, Saturday and Sunday), Disp: 30 tablet, Rfl: 12  Review of Systems  Social History   Tobacco Use  . Smoking status: Former Smoker    Last attempt to quit: 12/01/1987    Years since quitting: 30.7  . Smokeless tobacco: Never Used  Substance Use Topics  . Alcohol use: No      Objective:   BP 112/70 (BP Location: Left Arm, Patient Position: Sitting, Cuff Size: Large)   Pulse (!) 52   Resp 16   Wt 185 lb (83.9 kg)   SpO2 95%   BMI 24.41 kg/m  Vitals:   08/23/18 1312  BP: 112/70  Pulse: (!) 52  Resp: 16  SpO2: 95%  Weight: 185 lb (83.9 kg)     Physical Exam      Assessment & Plan        Mar Daring, PA-C  [facility]

## 2018-08-26 ENCOUNTER — Ambulatory Visit: Payer: Self-pay | Admitting: Physician Assistant

## 2018-08-27 ENCOUNTER — Ambulatory Visit (INDEPENDENT_AMBULATORY_CARE_PROVIDER_SITE_OTHER): Payer: Medicare Other

## 2018-08-27 DIAGNOSIS — I482 Chronic atrial fibrillation, unspecified: Secondary | ICD-10-CM | POA: Diagnosis not present

## 2018-08-27 LAB — POCT INR
INR: 2.5 (ref 2.0–3.0)
PT: 29.6

## 2018-08-27 NOTE — Patient Instructions (Signed)
Description   Current dose:2.5mg  daily except 1.25mg  Monday, Wednesday  and Friday. F/U 1 weeks Changes: no changes.

## 2018-09-03 ENCOUNTER — Ambulatory Visit (INDEPENDENT_AMBULATORY_CARE_PROVIDER_SITE_OTHER): Payer: Medicare Other

## 2018-09-03 DIAGNOSIS — I482 Chronic atrial fibrillation, unspecified: Secondary | ICD-10-CM

## 2018-09-03 LAB — POCT INR
INR: 2.6 (ref 2.0–3.0)
PT: 31.5

## 2018-09-03 NOTE — Patient Instructions (Signed)
  Description   Current dose:2.5mg  daily except 1.25mg  Monday, Wednesday  and Friday. F/U 4 weeks Changes: no changes.

## 2018-09-23 ENCOUNTER — Telehealth: Payer: Self-pay | Admitting: Internal Medicine

## 2018-09-23 NOTE — Telephone Encounter (Signed)
Pt c/o medication issue:  1. Name of Medication: digoxin 0.125 MG tablet, warfarin 2.5 MG and amiodarone 200 MG  2. How are you currently taking this medication (dosage and times per day)?   3. Are you having a reaction (difficulty breathing--STAT)?   4. What is your medication issue? Patient wife calling, states she was speaking with [facility] and they stated that if patient is taking amiodarone then he shouldn't be taking digoxin and warfarin as well.  Please call to discuss.

## 2018-09-24 ENCOUNTER — Other Ambulatory Visit: Payer: Self-pay

## 2018-09-24 ENCOUNTER — Ambulatory Visit
Admission: RE | Admit: 2018-09-24 | Discharge: 2018-09-24 | Disposition: A | Discharge status: Home / Self Care | Payer: Medicare Other | Attending: Physician Assistant | Admitting: Physician Assistant

## 2018-09-24 ENCOUNTER — Ambulatory Visit
Admission: RE | Admit: 2018-09-24 | Discharge: 2018-09-24 | Disposition: A | Discharge status: Home / Self Care | Payer: Medicare Other | Source: Ambulatory Visit | Attending: Physician Assistant | Admitting: Physician Assistant

## 2018-09-24 ENCOUNTER — Telehealth: Payer: Self-pay

## 2018-09-24 DIAGNOSIS — J181 Lobar pneumonia, unspecified organism: Secondary | ICD-10-CM | POA: Diagnosis not present

## 2018-09-24 DIAGNOSIS — J189 Pneumonia, unspecified organism: Secondary | ICD-10-CM | POA: Insufficient documentation

## 2018-09-24 DIAGNOSIS — R918 Other nonspecific abnormal finding of lung field: Secondary | ICD-10-CM | POA: Diagnosis not present

## 2018-09-24 NOTE — Telephone Encounter (Signed)
Patient advised as directed below.  Thanks,  -Ahava Kissoon 

## 2018-09-24 NOTE — Telephone Encounter (Signed)
Explained to wife that CXR is improving and imaging can lag behind symptomatic improvement even for a couple of months. Patient's wife reports Joseph Barton is doing well, no fevers, SOB, cough, weight loss. Also advised he will be due for follow up CT lungs ~ 10/14/2018 based on 07/15/2018 scan but due to coronavirus it is advisable to wait until his appointment with Dr. Rosanna Randy to get this ordered. Advised no role for further abx tx right now as patient is doing well. Wife expresses understanding.

## 2018-09-24 NOTE — Telephone Encounter (Signed)
Please Review

## 2018-09-24 NOTE — Telephone Encounter (Signed)
Patient's wife is calling saying that they just received the results for the patient's xray and she is concerned that he is still not completley well. She is wanting him to take another round of medication to "clear up the infection". Patient wanted to talk with Fabio Bering personally cause she "knows the situation." Contact her at [phone]. Thanks!

## 2018-09-24 NOTE — Telephone Encounter (Signed)
spoke to wife Zella, okay per DPR.   Per wife, they received call from [facility] that they would not fill medications due to possible interactions. According to discharge summary, these are intended medications. Pt had already received 90 day supply on 2/17 from local pharmacy.   I made call to [facility] to verify. Staff member was able to write those for patient for 90 day supply with 3 refills.   Call routed to [facility] as [facility] for any further advice.

## 2018-09-24 NOTE — Telephone Encounter (Signed)
-----   Message from Trinna Post, [place] sent at 09/24/2018 11:09 AM EDT ----- Improving CXR from prior pneumonia in January. Keep follow up with Dr. Rosanna Randy.

## 2018-09-24 NOTE — Telephone Encounter (Signed)
The patient's INR is followed by [facility]. The patient's wife was advised on 2/17 to please follow up with [facility] when the patient's amiodarone was decreased to 200 mg once daily for a follow up INR check.

## 2018-09-25 NOTE — Telephone Encounter (Signed)
I am confused about the issue-- are they unwilling to fill coumadin with amio?

## 2018-09-25 NOTE — Telephone Encounter (Signed)
I spoke with the patient's wife. She confirms Joseph Barton has all of his prescribed medications including amio/ digoxin/ warfarin. She also confirms he is getting his INR checked at [facility].   I have advised that we are aware the above medications may interact, however, if he is getting his INR's checked and he is therapeutic this should be fine.   I asked that she call back with any other concerns. Joseph Barton voices understanding and is agreeable.

## 2018-10-01 ENCOUNTER — Other Ambulatory Visit: Payer: Self-pay

## 2018-10-01 ENCOUNTER — Ambulatory Visit (INDEPENDENT_AMBULATORY_CARE_PROVIDER_SITE_OTHER): Payer: Medicare Other

## 2018-10-01 DIAGNOSIS — I482 Chronic atrial fibrillation, unspecified: Secondary | ICD-10-CM

## 2018-10-01 LAB — POCT INR
INR: 4.2 — AB (ref 2.0–3.0)
PT: 50.9

## 2018-10-01 NOTE — Progress Notes (Signed)
Pt/inr

## 2018-10-07 ENCOUNTER — Other Ambulatory Visit: Payer: Self-pay

## 2018-10-07 ENCOUNTER — Telehealth: Payer: Self-pay

## 2018-10-07 NOTE — Telephone Encounter (Signed)
Patient's wife called saying that the patient has developed a productive cough again. She reports that his cough is worse at night. She reports that he has not had fever. She is wanting to talk with Fabio Bering to see if the patient needs another abx or recommendations OTC he could take. I did advise that she would not be in until this afternoon. Contact info is correct. Thanks!

## 2018-10-08 NOTE — Telephone Encounter (Signed)
This patient needs to be assessed. Is Joseph Barton in, this is his patient. Please ask him if he'd like this patient to come into office or do e visit.

## 2018-10-08 NOTE — Telephone Encounter (Signed)
Sorry this is from yesterday also. Please advise.

## 2018-10-08 NOTE — Telephone Encounter (Signed)
Dr. Rosanna Randy please read previous message below. Wife states that patient has a productive cough with clear sputum that is keeping him up at night and states that it is unchanged. Wife denies fever,runny nose congestion, shortness of breath, ear pain, sinus pain/pressure, chest pain or back pain. Please advise if we can prescribe medication or would you prefer patient be seen in office? Patient uses [facility]. KW

## 2018-10-08 NOTE — Telephone Encounter (Signed)
Patients wife has been advised. KW 

## 2018-10-08 NOTE — Telephone Encounter (Signed)
Pt's wife called back this morning saying his cough is really bad at night and he needs something sent in or can someone please call her back   CB#  [phone]  Thanks teri

## 2018-10-08 NOTE — Telephone Encounter (Signed)
Lets try Robitussin DM at night and in AM. If not better tomorrow call back and I will see him here in office.

## 2018-10-10 DIAGNOSIS — S86911A Strain of unspecified muscle(s) and tendon(s) at lower leg level, right leg, initial encounter: Secondary | ICD-10-CM | POA: Diagnosis not present

## 2018-10-15 ENCOUNTER — Telehealth: Payer: Self-pay | Admitting: *Deleted

## 2018-10-15 ENCOUNTER — Ambulatory Visit (INDEPENDENT_AMBULATORY_CARE_PROVIDER_SITE_OTHER): Payer: Medicare Other

## 2018-10-15 ENCOUNTER — Other Ambulatory Visit: Payer: Self-pay

## 2018-10-15 DIAGNOSIS — I482 Chronic atrial fibrillation, unspecified: Secondary | ICD-10-CM | POA: Diagnosis not present

## 2018-10-15 LAB — POCT INR
INR: 4.8 — AB (ref 2.0–3.0)
PT: 57.9

## 2018-10-15 NOTE — Telephone Encounter (Signed)
Patient is elderly with history of pneumonia earlier this year that was resolving as well as severe CHF, atrial fibrillation and now feeling poorly with cough and sob. Cough has been ongoing for at least one week and patient has been instructed numerous times to go to the ER to be evaluated for his cough which can from multiple sources. He has refused, will forward to Dr. Rosanna Randy so he can check on him in the morning, he may feel differently when speaking to him.

## 2018-10-15 NOTE — Telephone Encounter (Signed)
Patient's wife called office stating patient is not feeling well. Patient is having slight sob and productive cough. Patient feels so bad he does not want to get out of bed. Patient is requesting an antibiotic. Per Fabio Bering patient's wife was advised to taker him to ED for evaluation. "Patient refused stating he was not going to the ER no matter what and if we don't give him an antibiotic he will go somewhere else to get it."

## 2018-10-15 NOTE — Patient Instructions (Signed)
Description   Hold for 2 days  1.25 per day and recheck in 2 weeks

## 2018-10-16 NOTE — Telephone Encounter (Signed)
Spoke with wife who states that patient is feeling some better today.  Had a better night last night.  No fevers. We will be happy to see patient if he worsens at any point in time.  Wife advised and understands.

## 2018-10-20 ENCOUNTER — Other Ambulatory Visit: Payer: Self-pay

## 2018-10-20 ENCOUNTER — Inpatient Hospital Stay
Admission: EM | Admit: 2018-10-20 | Discharge: 2018-11-01 | DRG: 189 | Disposition: A | Discharge status: Skilled Nursing Facility | Payer: Medicare Other | Attending: Internal Medicine | Admitting: Internal Medicine

## 2018-10-20 ENCOUNTER — Emergency Department: Payer: Medicare Other

## 2018-10-20 DIAGNOSIS — R6510 Systemic inflammatory response syndrome (SIRS) of non-infectious origin without acute organ dysfunction: Secondary | ICD-10-CM | POA: Diagnosis not present

## 2018-10-20 DIAGNOSIS — I959 Hypotension, unspecified: Secondary | ICD-10-CM | POA: Diagnosis present

## 2018-10-20 DIAGNOSIS — Z955 Presence of coronary angioplasty implant and graft: Secondary | ICD-10-CM

## 2018-10-20 DIAGNOSIS — K5900 Constipation, unspecified: Secondary | ICD-10-CM | POA: Diagnosis not present

## 2018-10-20 DIAGNOSIS — E1140 Type 2 diabetes mellitus with diabetic neuropathy, unspecified: Secondary | ICD-10-CM | POA: Diagnosis present

## 2018-10-20 DIAGNOSIS — I25118 Atherosclerotic heart disease of native coronary artery with other forms of angina pectoris: Secondary | ICD-10-CM | POA: Diagnosis not present

## 2018-10-20 DIAGNOSIS — I5023 Acute on chronic systolic (congestive) heart failure: Secondary | ICD-10-CM | POA: Diagnosis not present

## 2018-10-20 DIAGNOSIS — I4891 Unspecified atrial fibrillation: Secondary | ICD-10-CM | POA: Diagnosis not present

## 2018-10-20 DIAGNOSIS — J310 Chronic rhinitis: Secondary | ICD-10-CM | POA: Diagnosis present

## 2018-10-20 DIAGNOSIS — R0602 Shortness of breath: Secondary | ICD-10-CM

## 2018-10-20 DIAGNOSIS — J704 Drug-induced interstitial lung disorders, unspecified: Secondary | ICD-10-CM | POA: Diagnosis present

## 2018-10-20 DIAGNOSIS — Z87891 Personal history of nicotine dependence: Secondary | ICD-10-CM | POA: Diagnosis not present

## 2018-10-20 DIAGNOSIS — J9811 Atelectasis: Secondary | ICD-10-CM | POA: Diagnosis present

## 2018-10-20 DIAGNOSIS — M2550 Pain in unspecified joint: Secondary | ICD-10-CM | POA: Diagnosis not present

## 2018-10-20 DIAGNOSIS — Z8701 Personal history of pneumonia (recurrent): Secondary | ICD-10-CM

## 2018-10-20 DIAGNOSIS — R0902 Hypoxemia: Secondary | ICD-10-CM | POA: Diagnosis not present

## 2018-10-20 DIAGNOSIS — R5381 Other malaise: Secondary | ICD-10-CM | POA: Diagnosis not present

## 2018-10-20 DIAGNOSIS — J479 Bronchiectasis, uncomplicated: Secondary | ICD-10-CM | POA: Diagnosis present

## 2018-10-20 DIAGNOSIS — Z66 Do not resuscitate: Secondary | ICD-10-CM | POA: Diagnosis present

## 2018-10-20 DIAGNOSIS — J189 Pneumonia, unspecified organism: Secondary | ICD-10-CM | POA: Diagnosis not present

## 2018-10-20 DIAGNOSIS — I361 Nonrheumatic tricuspid (valve) insufficiency: Secondary | ICD-10-CM | POA: Diagnosis not present

## 2018-10-20 DIAGNOSIS — I4821 Permanent atrial fibrillation: Secondary | ICD-10-CM | POA: Diagnosis present

## 2018-10-20 DIAGNOSIS — T462X5A Adverse effect of other antidysrhythmic drugs, initial encounter: Secondary | ICD-10-CM | POA: Diagnosis not present

## 2018-10-20 DIAGNOSIS — Z888 Allergy status to other drugs, medicaments and biological substances status: Secondary | ICD-10-CM | POA: Diagnosis not present

## 2018-10-20 DIAGNOSIS — I213 ST elevation (STEMI) myocardial infarction of unspecified site: Secondary | ICD-10-CM | POA: Diagnosis not present

## 2018-10-20 DIAGNOSIS — R918 Other nonspecific abnormal finding of lung field: Secondary | ICD-10-CM | POA: Diagnosis not present

## 2018-10-20 DIAGNOSIS — J9601 Acute respiratory failure with hypoxia: Principal | ICD-10-CM | POA: Diagnosis present

## 2018-10-20 DIAGNOSIS — R531 Weakness: Secondary | ICD-10-CM | POA: Diagnosis not present

## 2018-10-20 DIAGNOSIS — J432 Centrilobular emphysema: Secondary | ICD-10-CM | POA: Diagnosis not present

## 2018-10-20 DIAGNOSIS — I340 Nonrheumatic mitral (valve) insufficiency: Secondary | ICD-10-CM | POA: Diagnosis present

## 2018-10-20 DIAGNOSIS — Z833 Family history of diabetes mellitus: Secondary | ICD-10-CM

## 2018-10-20 DIAGNOSIS — Z20828 Contact with and (suspected) exposure to other viral communicable diseases: Secondary | ICD-10-CM | POA: Diagnosis present

## 2018-10-20 DIAGNOSIS — I110 Hypertensive heart disease with heart failure: Secondary | ICD-10-CM | POA: Diagnosis present

## 2018-10-20 DIAGNOSIS — Z8679 Personal history of other diseases of the circulatory system: Secondary | ICD-10-CM

## 2018-10-20 DIAGNOSIS — Z7901 Long term (current) use of anticoagulants: Secondary | ICD-10-CM | POA: Diagnosis not present

## 2018-10-20 DIAGNOSIS — E875 Hyperkalemia: Secondary | ICD-10-CM | POA: Diagnosis not present

## 2018-10-20 DIAGNOSIS — R069 Unspecified abnormalities of breathing: Secondary | ICD-10-CM | POA: Diagnosis not present

## 2018-10-20 DIAGNOSIS — E785 Hyperlipidemia, unspecified: Secondary | ICD-10-CM | POA: Diagnosis present

## 2018-10-20 DIAGNOSIS — I878 Other specified disorders of veins: Secondary | ICD-10-CM | POA: Diagnosis present

## 2018-10-20 DIAGNOSIS — J9621 Acute and chronic respiratory failure with hypoxia: Secondary | ICD-10-CM

## 2018-10-20 DIAGNOSIS — R100 Acute abdomen: Secondary | ICD-10-CM | POA: Diagnosis not present

## 2018-10-20 DIAGNOSIS — Z7401 Bed confinement status: Secondary | ICD-10-CM | POA: Diagnosis not present

## 2018-10-20 DIAGNOSIS — I4510 Unspecified right bundle-branch block: Secondary | ICD-10-CM | POA: Diagnosis not present

## 2018-10-20 DIAGNOSIS — I472 Ventricular tachycardia: Secondary | ICD-10-CM | POA: Diagnosis not present

## 2018-10-20 DIAGNOSIS — J181 Lobar pneumonia, unspecified organism: Secondary | ICD-10-CM | POA: Diagnosis not present

## 2018-10-20 DIAGNOSIS — I509 Heart failure, unspecified: Secondary | ICD-10-CM | POA: Diagnosis not present

## 2018-10-20 DIAGNOSIS — R079 Chest pain, unspecified: Secondary | ICD-10-CM | POA: Diagnosis not present

## 2018-10-20 DIAGNOSIS — Z8249 Family history of ischemic heart disease and other diseases of the circulatory system: Secondary | ICD-10-CM

## 2018-10-20 DIAGNOSIS — J984 Other disorders of lung: Secondary | ICD-10-CM | POA: Diagnosis not present

## 2018-10-20 LAB — CBC WITH DIFFERENTIAL/PLATELET
Abs Immature Granulocytes: 0.06 10*3/uL (ref 0.00–0.07)
Basophils Absolute: 0 10*3/uL (ref 0.0–0.1)
Basophils Relative: 0 %
Eosinophils Absolute: 0.2 10*3/uL (ref 0.0–0.5)
Eosinophils Relative: 2 %
HCT: 42.9 % (ref 39.0–52.0)
Hemoglobin: 13.9 g/dL (ref 13.0–17.0)
Immature Granulocytes: 1 %
Lymphocytes Relative: 13 %
Lymphs Abs: 1.2 10*3/uL (ref 0.7–4.0)
MCH: 30 pg (ref 26.0–34.0)
MCHC: 32.4 g/dL (ref 30.0–36.0)
MCV: 92.7 fL (ref 80.0–100.0)
Monocytes Absolute: 0.8 10*3/uL (ref 0.1–1.0)
Monocytes Relative: 9 %
Neutro Abs: 6.6 10*3/uL (ref 1.7–7.7)
Neutrophils Relative %: 75 %
Platelets: 235 10*3/uL (ref 150–400)
RBC: 4.63 MIL/uL (ref 4.22–5.81)
RDW: 14.5 % (ref 11.5–15.5)
WBC: 8.8 10*3/uL (ref 4.0–10.5)
nRBC: 0 % (ref 0.0–0.2)

## 2018-10-20 LAB — URINALYSIS, COMPLETE (UACMP) WITH MICROSCOPIC
Bacteria, UA: NONE SEEN
Bilirubin Urine: NEGATIVE
Glucose, UA: NEGATIVE mg/dL
Hgb urine dipstick: NEGATIVE
Ketones, ur: NEGATIVE mg/dL
Leukocytes,Ua: NEGATIVE
Nitrite: NEGATIVE
Protein, ur: NEGATIVE mg/dL
Specific Gravity, Urine: 1.02 (ref 1.005–1.030)
pH: 5 (ref 5.0–8.0)

## 2018-10-20 LAB — COMPREHENSIVE METABOLIC PANEL
ALT: 20 U/L (ref 0–44)
AST: 30 U/L (ref 15–41)
Albumin: 2.8 g/dL — ABNORMAL LOW (ref 3.5–5.0)
Alkaline Phosphatase: 92 U/L (ref 38–126)
Anion gap: 9 (ref 5–15)
BUN: 27 mg/dL — ABNORMAL HIGH (ref 8–23)
CO2: 23 mmol/L (ref 22–32)
Calcium: 7.9 mg/dL — ABNORMAL LOW (ref 8.9–10.3)
Chloride: 103 mmol/L (ref 98–111)
Creatinine, Ser: 1.24 mg/dL (ref 0.61–1.24)
GFR calc Af Amer: 59 mL/min — ABNORMAL LOW (ref >60)
GFR calc non Af Amer: 51 mL/min — ABNORMAL LOW (ref >60)
Glucose, Bld: 119 mg/dL — ABNORMAL HIGH (ref 70–99)
Potassium: 4.4 mmol/L (ref 3.5–5.1)
Sodium: 135 mmol/L (ref 135–145)
Total Bilirubin: 0.4 mg/dL (ref 0.3–1.2)
Total Protein: 7 g/dL (ref 6.5–8.1)

## 2018-10-20 LAB — PROCALCITONIN: Procalcitonin: <0.1 ng/mL

## 2018-10-20 LAB — PROTIME-INR
INR: 2.4 — ABNORMAL HIGH (ref 0.8–1.2)
Prothrombin Time: 25.9 seconds — ABNORMAL HIGH (ref 11.4–15.2)

## 2018-10-20 LAB — FIBRINOGEN: Fibrinogen: 586 mg/dL — ABNORMAL HIGH (ref 210–475)

## 2018-10-20 LAB — LACTIC ACID, PLASMA
Lactic Acid, Venous: 2.6 mmol/L (ref 0.5–1.9)
Lactic Acid, Venous: 2.4 mmol/L (ref 0.5–1.9)

## 2018-10-20 LAB — FERRITIN: Ferritin: 305 ng/mL (ref 24–336)

## 2018-10-20 LAB — TROPONIN I: Troponin I: <0.03 ng/mL (ref <0.03)

## 2018-10-20 LAB — TRIGLYCERIDES: Triglycerides: 84 mg/dL (ref <150)

## 2018-10-20 LAB — LACTATE DEHYDROGENASE: LDH: 369 U/L — ABNORMAL HIGH (ref 98–192)

## 2018-10-20 LAB — BRAIN NATRIURETIC PEPTIDE: B Natriuretic Peptide: 394 pg/mL — ABNORMAL HIGH (ref 0.0–100.0)

## 2018-10-20 LAB — DIGOXIN LEVEL: Digoxin Level: 0.6 ng/mL — ABNORMAL LOW (ref 0.8–2.0)

## 2018-10-20 LAB — SARS CORONAVIRUS 2 BY RT PCR (HOSPITAL ORDER, PERFORMED IN ~~LOC~~ HOSPITAL LAB): SARS Coronavirus 2: NEGATIVE

## 2018-10-20 LAB — C-REACTIVE PROTEIN: CRP: 8.9 mg/dL — ABNORMAL HIGH (ref <1.0)

## 2018-10-20 LAB — STREP PNEUMONIAE URINARY ANTIGEN: Strep Pneumo Urinary Antigen: NEGATIVE

## 2018-10-20 MED ORDER — SODIUM CHLORIDE 0.9% FLUSH
3.0000 mL | INTRAVENOUS | Status: DC | PRN
  Administered 2018-10-24: 3 mL via INTRAVENOUS
  Filled 2018-10-20: qty 3

## 2018-10-20 MED ORDER — SODIUM CHLORIDE 0.9 % IV SOLN
1.0000 g | Freq: Once | INTRAVENOUS | Status: AC
  Administered 2018-10-20: 19:00:00 1 g via INTRAVENOUS
  Filled 2018-10-20: qty 1

## 2018-10-20 MED ORDER — PANTOPRAZOLE SODIUM 40 MG PO TBEC
40.0000 mg | DELAYED_RELEASE_TABLET | Freq: Every day | ORAL | Status: DC
  Administered 2018-10-21 – 2018-11-01 (×12): 40 mg via ORAL
  Filled 2018-10-20 (×12): qty 1

## 2018-10-20 MED ORDER — SODIUM CHLORIDE 0.9 % IV BOLUS
500.0000 mL | Freq: Once | INTRAVENOUS | Status: AC
  Administered 2018-10-20: 19:00:00 500 mL via INTRAVENOUS

## 2018-10-20 MED ORDER — METOPROLOL SUCCINATE ER 25 MG PO TB24
12.5000 mg | ORAL_TABLET | Freq: Every day | ORAL | Status: DC
  Administered 2018-10-23 – 2018-10-26 (×4): 12.5 mg via ORAL
  Filled 2018-10-20 (×6): qty 1

## 2018-10-20 MED ORDER — WARFARIN 1.25 MG HALF TABLET
1.2500 mg | ORAL_TABLET | ORAL | Status: DC
  Administered 2018-10-21 – 2018-10-23 (×2): 1.25 mg via ORAL
  Filled 2018-10-20 (×2): qty 1

## 2018-10-20 MED ORDER — DIGOXIN 125 MCG PO TABS
0.0625 mg | ORAL_TABLET | Freq: Every day | ORAL | Status: DC
  Administered 2018-10-23 – 2018-11-01 (×9): 0.0625 mg via ORAL
  Filled 2018-10-20 (×12): qty 0.5

## 2018-10-20 MED ORDER — SODIUM CHLORIDE 0.9 % IV SOLN
2.0000 g | Freq: Two times a day (BID) | INTRAVENOUS | Status: DC
  Administered 2018-10-21 – 2018-10-22 (×3): 2 g via INTRAVENOUS
  Filled 2018-10-20 (×3): qty 2

## 2018-10-20 MED ORDER — WARFARIN SODIUM 2.5 MG PO TABS
2.5000 mg | ORAL_TABLET | ORAL | Status: DC
  Administered 2018-10-22: 2.5 mg via ORAL
  Filled 2018-10-20 (×2): qty 1

## 2018-10-20 MED ORDER — VANCOMYCIN HCL IN DEXTROSE 1-5 GM/200ML-% IV SOLN
1000.0000 mg | Freq: Once | INTRAVENOUS | Status: AC
Start: 2018-10-20 — End: 2018-10-20
  Administered 2018-10-20: 19:00:00 1000 mg via INTRAVENOUS
  Filled 2018-10-20: qty 200

## 2018-10-20 MED ORDER — WARFARIN - PHYSICIAN DOSING INPATIENT
Freq: Every day | Status: DC
  Administered 2018-10-21 – 2018-10-23 (×3)

## 2018-10-20 MED ORDER — TAMSULOSIN HCL 0.4 MG PO CAPS
0.4000 mg | ORAL_CAPSULE | Freq: Every day | ORAL | Status: DC
  Administered 2018-10-20 – 2018-11-01 (×13): 0.4 mg via ORAL
  Filled 2018-10-20 (×13): qty 1

## 2018-10-20 MED ORDER — LISINOPRIL 5 MG PO TABS
2.5000 mg | ORAL_TABLET | Freq: Every day | ORAL | Status: DC
  Filled 2018-10-20 (×2): qty 1

## 2018-10-20 MED ORDER — VANCOMYCIN HCL 10 G IV SOLR
1750.0000 mg | INTRAVENOUS | Status: DC
  Administered 2018-10-22: 1750 mg via INTRAVENOUS
  Filled 2018-10-20: qty 1750

## 2018-10-20 MED ORDER — AMIODARONE HCL 200 MG PO TABS
200.0000 mg | ORAL_TABLET | Freq: Every day | ORAL | Status: DC
  Administered 2018-10-22 – 2018-10-23 (×2): 200 mg via ORAL
  Filled 2018-10-20 (×3): qty 1

## 2018-10-20 MED ORDER — WARFARIN - PHYSICIAN DOSING INPATIENT: Freq: Every day | Status: DC

## 2018-10-20 MED ORDER — ALBUTEROL SULFATE (2.5 MG/3ML) 0.083% IN NEBU
2.5000 mg | INHALATION_SOLUTION | Freq: Four times a day (QID) | RESPIRATORY_TRACT | Status: DC | PRN
  Administered 2018-10-20 – 2018-10-28 (×4): 2.5 mg via RESPIRATORY_TRACT
  Filled 2018-10-20 (×4): qty 3

## 2018-10-20 MED ORDER — SODIUM CHLORIDE 0.9 % IV SOLN: 250.0000 mL | INTRAVENOUS | Status: DC | PRN

## 2018-10-20 MED ORDER — SODIUM CHLORIDE 0.9% FLUSH
3.0000 mL | Freq: Two times a day (BID) | INTRAVENOUS | Status: DC
  Administered 2018-10-20 – 2018-11-01 (×22): 3 mL via INTRAVENOUS

## 2018-10-20 MED ORDER — SODIUM CHLORIDE 0.9 % IV SOLN
INTRAVENOUS | Status: DC
  Administered 2018-10-20 – 2018-10-21 (×2): via INTRAVENOUS

## 2018-10-20 MED ORDER — VANCOMYCIN HCL IN DEXTROSE 1-5 GM/200ML-% IV SOLN
1000.0000 mg | Freq: Once | INTRAVENOUS | Status: AC
  Administered 2018-10-20: 21:00:00 1000 mg via INTRAVENOUS
  Filled 2018-10-20: qty 200

## 2018-10-20 NOTE — Progress Notes (Signed)
CODE SEPSIS - PHARMACY COMMUNICATION  **Broad Spectrum Antibiotics should be administered within 1 hour of Sepsis diagnosis**  Time Code Sepsis Called/Page Received: 4/19 @ 1944   Antibiotics Ordered: Vanc, Cefepime   Time of 1st antibiotic administration: Cefepime given @ 1918   Additional action taken by pharmacy:   If necessary, Name of Provider/Nurse Contacted:     Kassondra Geil D ,PharmD Clinical Pharmacist  10/20/2018  8:00 PM

## 2018-10-20 NOTE — ED Triage Notes (Signed)
Pt comes from home via EMS with SOB and generalized weakness. EKG with EMS reads stemi but Dr. Alfred Levins calls off stemi. Pt does have a productive cough with EMS with yellow phlem. Pt does not wear oxygen at home but is on 3L with EMS. Afebrile with EMS.

## 2018-10-20 NOTE — ED Notes (Signed)
Assisted RN with admission process and blood cultures

## 2018-10-20 NOTE — Progress Notes (Signed)
Advanced care plan. Purpose of the Encounter: CODE STATUS Parties in Attendance: Patient Patient's Decision Capacity: Good Subjective/Patient's story: Joseph Barton  is a 85 y.o. male with a known history of pneumonia in the past, chronic systolic heart failure with EF of 35%, coronary artery disease, abdominal aortic aneurysm, hyperlipidemia, hypertension, mitral regurgitation, atrial fibrillation on Coumadin presented to emergency room for shortness of breath and cough.   Objective/Medical story Patient presented to the emergency room for cough shortness of breath.  Has pneumonia on chest x-ray needed broad-spectrum IV antibiotics.  Coronavirus test will be done.  Check for cultures. Goals of care determination:  Advance care directives goals of care treatment plan discussed Patient does not want CPR, intubation ventilator if the need arises CODE STATUS: DNR Time spent discussing advanced care planning: 16 minutes

## 2018-10-20 NOTE — Progress Notes (Signed)
Spoke with MD about patients O2 stats being low in the 80's and got a verbal order to put him up to 6L on [name]. His O2 stats are 94%.

## 2018-10-20 NOTE — Progress Notes (Signed)
PHARMACY NOTE:  ANTIMICROBIAL RENAL DOSAGE ADJUSTMENT  Current antimicrobial regimen includes a mismatch between antimicrobial dosage and estimated renal function.  As per policy approved by the Pharmacy & Therapeutics and Medical Executive Committees, the antimicrobial dosage will be adjusted accordingly.  Current antimicrobial dosage:   Cefepime 1 gm IV Q8H   Indication: HCAP   Renal Function:  Estimated Creatinine Clearance: 44.2 mL/min (by C-G formula based on SCr of 1.24 mg/dL). []      On intermittent HD, scheduled: []      On CRRT    Antimicrobial dosage has been changed to:  Cefepime 2 gm IV Q12H.   Additional comments:   Thank you for allowing pharmacy to be a part of this patient's care.  Orene Desanctis, [facility] 10/20/2018 9:34 PM

## 2018-10-20 NOTE — H&P (Signed)
[facility] [place] NAME: Joseph Barton    MR#:  [id number]  DATE OF BIRTH:  09-24-1926  DATE OF ADMISSION:  10/20/2018  PRIMARY CARE PHYSICIAN: Jerrol Banana., MD   REQUESTING/REFERRING PHYSICIAN:   CHIEF COMPLAINT:   Chief Complaint  Patient presents with  . Weakness    HISTORY OF PRESENT ILLNESS: Joseph Barton  is a 85 y.o. male with a known history of pneumonia in the past, chronic systolic heart failure with EF of 35%, coronary artery disease, abdominal aortic aneurysm, hyperlipidemia, hypertension, mitral regurgitation, atrial fibrillation on Coumadin presented to emergency room for shortness of breath and cough.   Has generalized weakness.  Recently treated for pneumonia.  She was evaluated and tested for coronavirus.  Test result was negative.  Patient was started on broad-spectrum IV antibiotics that is vancomycin and cefepime chest x-ray revealed pneumonia .  Lactic acid was elevated and patient received IV fluid bolus 500 mL in the emergency room considering his congestive heart failure history and poor EF.  Currently is on oxygen via nasal cannula at 4 L.  No history of travel.  No history of any international visitors meeding him.  No sick contacts at home.  PAST MEDICAL HISTORY:   Past Medical History:  Diagnosis Date  . Abdominal aortic aneurysm ([place]) 01-2004   4.7 x 4.7 cm.  Marland Kitchen CAD (coronary artery disease) 01/25/2004   a. 01/2004 Ant MI/DES to LAD;  b. 10/2011 Neg MV.  . CHF (congestive heart failure) ([place])   . Chronic systolic heart failure ([place])    a. 08/2016 Echo: EF 35%, nl RV fxn, mod to sev MR, mild to mod TR, mod biatrial enlargement.  . Hyperlipidemia   . Hypertension   . Ischemic cardiomyopathy    a. 08/2016 Echo: EF 35%.  . Moderate to Severe Mitral regurgitation    a. 08/2016 Echo: Mod-sev MR.  Marland Kitchen Permanent atrial fibrillation    a. Chronic coumadin (CHA2DS2VASc = 5).  . Pulmonary nodules    Noted on  abdominal CT  . Statin intolerance     PAST SURGICAL HISTORY:  Past Surgical History:  Procedure Laterality Date  . ABDOMINAL AORTIC ANEURYSM REPAIR     12/02/2007 [facility]  . ABDOMINAL AORTIC ANEURYSM REPAIR  2006   [facility]   . CARDIAC CATHETERIZATION  2009   [facility]  . CORONARY ANGIOPLASTY WITH STENT PLACEMENT  2005   Cypher stent LAD   . Cypher stents to LAD     01/25/2004  . SKIN SURGERY  2016   [facility] skin cancer    SOCIAL HISTORY:  Social History   Tobacco Use  . Smoking status: Former Smoker    Last attempt to quit: 12/01/1987    Years since quitting: 30.9  . Smokeless tobacco: Never Used  Substance Use Topics  . Alcohol use: No    FAMILY HISTORY:  Family History  Problem Relation Age of Onset  . Heart attack Father 65       Died w/ Heart attack  . Diabetes Father   . Heart attack Brother   . Heart disease Brother   . Diabetes Sister   . Heart disease Sister     DRUG ALLERGIES:  Allergies  Allergen Reactions  . Atorvastatin   . Simvastatin     REVIEW OF SYSTEMS:   CONSTITUTIONAL: No fever,has fatigue and weakness.  EYES: No blurred or double vision.  EARS, NOSE, AND THROAT: No  tinnitus or ear pain.  RESPIRATORY: Has cough, shortness of breath,  No wheezing or hemoptysis.  CARDIOVASCULAR: No chest pain, orthopnea, edema.  GASTROINTESTINAL: No nausea, vomiting, diarrhea or abdominal pain.  GENITOURINARY: No dysuria, hematuria.  ENDOCRINE: No polyuria, nocturia,  HEMATOLOGY: No anemia, easy bruising or bleeding SKIN: No rash or lesion. MUSCULOSKELETAL: No joint pain or arthritis.   NEUROLOGIC: No tingling, numbness, weakness.  PSYCHIATRY: No anxiety or depression.   MEDICATIONS AT HOME:  Prior to Admission medications   Medication Sig Start Date End Date Taking? Authorizing Provider  albuterol (PROVENTIL HFA;VENTOLIN HFA) 108 (90 Base) MCG/ACT inhaler Inhale 2 puffs into the lungs every 6 (six) hours as needed for wheezing or shortness of  breath. 11/29/17  Yes Jerrol Banana., MD  amiodarone (PACERONE) 200 MG tablet Take 1 tablet (200 mg) by mouth once daily 08/19/18  Yes Deboraha Sprang, MD  digoxin (LANOXIN) 0.125 MG tablet Take 0.0625 mg by mouth daily.    Yes [provider]  furosemide (LASIX) 20 MG tablet Take 40 mg by mouth daily.   Yes [provider]  lisinopril (PRINIVIL,ZESTRIL) 2.5 MG tablet TAKE 1 TABLET BY MOUTH  DAILY 08/09/18  Yes Deboraha Sprang, MD  metoprolol succinate (TOPROL-XL) 25 MG 24 hr tablet TAKE ONE-HALF TABLET BY  MOUTH DAILY 08/09/18  Yes Deboraha Sprang, MD  omeprazole (PRILOSEC) 20 MG capsule TAKE 1 CAPSULE BY MOUTH  DAILY 02/16/18  Yes Jerrol Banana., MD  tamsulosin (FLOMAX) 0.4 MG CAPS capsule Take 1 capsule (0.4 mg total) by mouth daily. 07/29/18  Yes Jerrol Banana., MD  warfarin (COUMADIN) 2.5 MG tablet Take 1 tablet (2.5 mg total) by mouth as directed. Patient taking differently: Take  tablet (1.25MG ) by mouth Monday, Wednesday, Friday and take 1 tablet (2.5MG ) by mouth Tuesday, Thursday, Saturday and Sunday 10/03/17  Yes Jerrol Banana., MD  Bioflavonoid Products (BIOFLEX PO) Take 1 tablet by mouth daily.     [provider]  polyethylene glycol powder (GLYCOLAX) powder Take 1 scoop by mouth daily. 07/29/18   Jerrol Banana., MD  potassium chloride 20 MEQ TBCR Take 20 mEq by mouth daily. 12/06/17 07/30/18  Henreitta Leber, MD      PHYSICAL EXAMINATION:   VITAL SIGNS: Blood pressure 121/89, pulse 89, temperature 98.3 F (36.8 C), temperature source Oral, resp. rate (!) 23, height 5\' 8"  (1.727 m), weight 83 kg, SpO2 (!) 89 %.  GENERAL:  85 y.o.-year-old patient lying in the bed with no acute distress.  EYES: Pupils equal, round, reactive to light and accommodation. No scleral icterus. Extraocular muscles intact.  HEENT: Head atraumatic, normocephalic. Oropharynx and nasopharynx clear.  NECK:  Supple, no jugular venous distention. No thyroid  enlargement, no tenderness.  LUNGS: Decreased breath sounds bilaterally, bilateral rales heard. No use of accessory muscles of respiration.  CARDIOVASCULAR: S1, S2 normal. No murmurs, rubs, or gallops.  ABDOMEN: Soft, nontender, nondistended. Bowel sounds present. No organomegaly or mass.  EXTREMITIES: No pedal edema, cyanosis, or clubbing.  NEUROLOGIC: Cranial nerves II through XII are intact. Muscle strength 5/5 in all extremities. Sensation intact. Gait not checked.  PSYCHIATRIC: The patient is alert and oriented x 3.  SKIN: No obvious rash, lesion, or ulcer.   LABORATORY PANEL:   CBC Recent Labs  Lab 10/20/18 1836  WBC 8.8  HGB 13.9  HCT 42.9  PLT 235  MCV 92.7  MCH 30.0  MCHC 32.4  RDW 14.5  LYMPHSABS  1.2  MONOABS 0.8  EOSABS 0.2  BASOSABS 0.0   ------------------------------------------------------------------------------------------------------------------  Chemistries  Recent Labs  Lab 10/20/18 1840  NA 135  K 4.4  CL 103  CO2 23  GLUCOSE 119*  BUN 27*  CREATININE 1.24  CALCIUM 7.9*  AST 30  ALT 20  ALKPHOS 92  BILITOT 0.4   ------------------------------------------------------------------------------------------------------------------ estimated creatinine clearance is 41.6 mL/min (by C-G formula based on SCr of 1.24 mg/dL). ------------------------------------------------------------------------------------------------------------------ No results for input(s): TSH, T4TOTAL, T3FREE, THYROIDAB in the last 72 hours.  Invalid input(s): FREET3   Coagulation profile Recent Labs  Lab 10/15/18 0930 10/20/18 1836  INR 4.8* 2.4*   ------------------------------------------------------------------------------------------------------------------- No results for input(s): DDIMER in the last 72 hours. -------------------------------------------------------------------------------------------------------------------  Cardiac Enzymes Recent Labs  Lab  10/20/18 1836  TROPONINI <0.03   ------------------------------------------------------------------------------------------------------------------ Invalid input(s): POCBNP  ---------------------------------------------------------------------------------------------------------------  Urinalysis    Component Value Date/Time   COLORURINE YELLOW (A) 07/15/2018 2021   APPEARANCEUR CLEAR (A) 07/15/2018 2021   APPEARANCEUR Clear 09/03/2011 0737   LABSPEC >1.046 (H) 07/15/2018 2021   LABSPEC 1.020 09/03/2011 0737   PHURINE 5.0 07/15/2018 2021   GLUCOSEU NEGATIVE 07/15/2018 2021   GLUCOSEU Negative 09/03/2011 0737   HGBUR NEGATIVE 07/15/2018 2021   BILIRUBINUR NEGATIVE 07/15/2018 2021   BILIRUBINUR Negative 09/03/2011 0737   KETONESUR 5 (A) 07/15/2018 2021   PROTEINUR NEGATIVE 07/15/2018 2021   NITRITE NEGATIVE 07/15/2018 2021   LEUKOCYTESUR NEGATIVE 07/15/2018 2021   LEUKOCYTESUR Negative 09/03/2011 0737     RADIOLOGY: Dg Chest Portable 1 View  Result Date: 10/20/2018 CLINICAL DATA:  Shortness of breath, hypoxia EXAM: PORTABLE CHEST 1 VIEW COMPARISON:  09/24/2018 FINDINGS: Bilateral diffuse interstitial and alveolar airspace opacities with a peripheral predominance. No pleural effusion or pneumothorax. Stable cardiomediastinal silhouette. No aggressive osseous lesion. IMPRESSION: 1. Chronic interstitial lung disease. 2. Increased bilateral interstitial and alveolar airspace opacities with a peripheral predominance. Differential considerations include multilobar pneumonia including atypical infection such as viral pneumonia. Electronically Signed   By: Kathreen Devoid   On: 10/20/2018 18:55    EKG: Orders placed or performed during the hospital encounter of 10/20/18  . ED EKG  . ED EKG  . EKG 12-Lead  . EKG 12-Lead    IMPRESSION AND PLAN: 85 year old male patient with a known history of pneumonia in the past, chronic systolic heart failure with EF of 35%, coronary artery disease,  abdominal aortic aneurysm, hyperlipidemia, hypertension, mitral regurgitation, atrial fibrillation on Coumadin presented to emergency room for shortness of breath and cough.    -Acute respiratory distress with hypoxia Secondary to pneumonia Continue oxygen via nasal cannula    -Multilobar pneumonia Healthcare associated pneumonia Start patient on IV vancomycin and cefepime antibiotics Follow-up cultures Coronavirus test negative  -Systemic inflammatory response syndrome secondary to pneumonia Follow-up lactic acid level Continue IV antibiotics  -Chronic atrial fibrillation INR therapeutic Continue Coumadin for anticoagulation Continue oral amiodarone Continue digoxin for rate control  -Hypertension Continue ACE inhibitor  All the records are reviewed and case discussed with ED provider. Management plans discussed with the patient, family and they are in agreement.  CODE STATUS:DNR Code Status History    Date Active Date Inactive Code Status Order ID Comments User Context   07/16/2018 1139 07/20/2018 1713 DNR [id number]  Saundra Shelling, MD ED   07/16/2018 0451 07/16/2018 1139 Full Code [id number]  Gladstone Lighter, MD ED   12/04/2017 1517 12/06/2017 1916 Partial Code [id number]  Salary, Avel Peace, MD Inpatient    Questions for Most Recent Historical Code  Status (Order [id number])    Question Answer Comment   In the event of cardiac or respiratory ARREST Do not call a "code blue"    In the event of cardiac or respiratory ARREST Do not perform Intubation, CPR, defibrillation or ACLS    In the event of cardiac or respiratory ARREST Use medication by any route, position, wound care, and other measures to relive pain and suffering. May use oxygen, suction and manual treatment of airway obstruction as needed for comfort.        TOTAL TIME TAKING CARE OF THIS PATIENT: 53 minutes.    Saundra Shelling M.D on 10/20/2018 at 8:12 PM  Between 7am to 6pm - Pager - [phone]  After 6pm go to  [email or web address] - password [email or web address] [facility]  [facility]  Office  [phone]  CC: Primary care physician; Jerrol Banana., MD

## 2018-10-20 NOTE — Progress Notes (Signed)
Pharmacy Antibiotic Note  Joseph Barton is a 85 y.o. male admitted on 10/20/2018 with pneumonia.  Pharmacy has been consulted for Vancomycin dosing.  Plan: Vancomycin 1 gm IV X 1 given in ED on 4/19 @ 1900. Vancomycin 1 gm IV X 1 ordered to be given following initial 1 gm dose to make total loading dose of 2 gm.  Vancomycin 1750 mg IV Q36H ordered to start on 4/21 @ 0700.   No peak or trough currently ordered.   CrCl = 38 ml/min Ke = 0.036 hr-1 T1/2 = 19.1 hrs Vd = 59.8 L   AUC = 539 Vanc trough = 11.7 mcg/mL   Height: 5\' 8"  (172.7 cm) Weight: 182 lb 15.7 oz (83 kg) IBW/kg (Calculated) : 68.4  Temp (24hrs), Avg:98.3 F (36.8 C), Min:98.3 F (36.8 C), Max:98.3 F (36.8 C)  Recent Labs  Lab 10/20/18 1836 10/20/18 1840  WBC 8.8  --   CREATININE  --  1.24  LATICACIDVEN  --  2.6*    Estimated Creatinine Clearance: 41.6 mL/min (by C-G formula based on SCr of 1.24 mg/dL).    Allergies  Allergen Reactions  . Atorvastatin   . Simvastatin     Antimicrobials this admission:   >>    >>   Dose adjustments this admission:   Microbiology results:  BCx:   UCx:    Sputum:    MRSA PCR:   Thank you for allowing pharmacy to be a part of this patient's care.  Nicollette Wilhelmi D 10/20/2018 8:31 PM

## 2018-10-20 NOTE — ED Notes (Signed)
ED TO INPATIENT HANDOFF REPORT  ED Nurse Name and Phone #: Janett Billow 36  S Name/Age/Gender Joseph Barton 85 y.o. male Room/Bed: ED01A/ED01A  Code Status   Code Status: Prior  Home/SNF/Other Home Patient oriented to: self, place, time and situation Is this baseline? Yes   Triage Complete: Triage complete  Chief Complaint Chest Pain  Triage Note Pt comes from home via EMS with SOB and generalized weakness. EKG with EMS reads stemi but Dr. Alfred Levins calls off stemi. Pt does have a productive cough with EMS with yellow phlem. Pt does not wear oxygen at home but is on 3L with EMS. Afebrile with EMS.    Allergies Allergies  Allergen Reactions  . Atorvastatin   . Simvastatin     Level of Care/Admitting Diagnosis ED Disposition    ED Disposition Condition [facility]: [place] [[id number]]  Level of Care: Med-Surg [16]  Covid Evaluation: N/A  Diagnosis: HCAP (healthcare-associated pneumonia) [381017]  Admitting Physician: Saundra Shelling [[id number]]  Attending Physician: Saundra Shelling [[id number]]  Estimated length of stay: past midnight tomorrow  Certification:: I certify this patient will need inpatient services for at least 2 midnights  PT Class (Do Not Modify): Inpatient [101]  PT Acc Code (Do Not Modify): Private [1]       B Medical/Surgery History Past Medical History:  Diagnosis Date  . Abdominal aortic aneurysm ([place]) 01-2004   4.7 x 4.7 cm.  Marland Kitchen CAD (coronary artery disease) 01/25/2004   a. 01/2004 Ant MI/DES to LAD;  b. 10/2011 Neg MV.  . CHF (congestive heart failure) ([place])   . Chronic systolic heart failure ([place])    a. 08/2016 Echo: EF 35%, nl RV fxn, mod to sev MR, mild to mod TR, mod biatrial enlargement.  . Hyperlipidemia   . Hypertension   . Ischemic cardiomyopathy    a. 08/2016 Echo: EF 35%.  . Moderate to Severe Mitral regurgitation    a. 08/2016 Echo: Mod-sev MR.  Marland Kitchen Permanent atrial fibrillation    a. Chronic coumadin  (CHA2DS2VASc = 5).  . Pulmonary nodules    Noted on abdominal CT  . Statin intolerance    Past Surgical History:  Procedure Laterality Date  . ABDOMINAL AORTIC ANEURYSM REPAIR     12/02/2007 [facility]  . ABDOMINAL AORTIC ANEURYSM REPAIR  2006   [facility]   . CARDIAC CATHETERIZATION  2009   [facility]  . CORONARY ANGIOPLASTY WITH STENT PLACEMENT  2005   Cypher stent LAD   . Cypher stents to LAD     01/25/2004  . SKIN SURGERY  2016   [facility] skin cancer     A IV Location/Drains/Wounds Patient Lines/Drains/Airways Status   Active Line/Drains/Airways    Name:   Placement date:   Placement time:   Site:   Days:   Peripheral IV 10/20/18 Left Antecubital   10/20/18    1906    Antecubital   less than 1   Peripheral IV 10/20/18 Right Forearm   10/20/18    1907    Forearm   less than 1   Peripheral IV 10/20/18 Left Hand   10/20/18    1907    Hand   less than 1          Intake/Output Last 24 hours  Intake/Output Summary (Last 24 hours) at 10/20/2018 2009 Last data filed at 10/20/2018 2004 Gross per 24 hour  Intake 500 ml  Output -  Net 500 ml  Labs/Imaging Results for orders placed or performed during the hospital encounter of 10/20/18 (from the past 48 hour(s))  Troponin I - ONCE - STAT     Status: None   Collection Time: 10/20/18  6:36 PM  Result Value Ref Range   Troponin I <0.03 <0.03 ng/mL    Comment: Performed at [facility], [address] [place], [place] [address]  CBC with Differential/Platelet     Status: None   Collection Time: 10/20/18  6:36 PM  Result Value Ref Range   WBC 8.8 4.0 - 10.5 K/uL   RBC 4.63 4.22 - 5.81 MIL/uL   Hemoglobin 13.9 13.0 - 17.0 g/dL   HCT 42.9 39.0 - 52.0 %   MCV 92.7 80.0 - 100.0 fL   MCH 30.0 26.0 - 34.0 pg   MCHC 32.4 30.0 - 36.0 g/dL   RDW 14.5 11.5 - 15.5 %   Platelets 235 150 - 400 K/uL   nRBC 0.0 0.0 - 0.2 %   Neutrophils Relative % 75 %   Neutro Abs 6.6 1.7 - 7.7 K/uL   Lymphocytes Relative 13 %   Lymphs Abs 1.2  0.7 - 4.0 K/uL   Monocytes Relative 9 %   Monocytes Absolute 0.8 0.1 - 1.0 K/uL   Eosinophils Relative 2 %   Eosinophils Absolute 0.2 0.0 - 0.5 K/uL   Basophils Relative 0 %   Basophils Absolute 0.0 0.0 - 0.1 K/uL   Immature Granulocytes 1 %   Abs Immature Granulocytes 0.06 0.00 - 0.07 K/uL    Comment: Performed at [facility], [address] [place], [place] [address]  Digoxin level     Status: Abnormal   Collection Time: 10/20/18  6:36 PM  Result Value Ref Range   Digoxin Level 0.6 (L) 0.8 - 2.0 ng/mL    Comment: Performed at [facility], [address] [place], [place] [address]  Protime-INR     Status: Abnormal   Collection Time: 10/20/18  6:36 PM  Result Value Ref Range   Prothrombin Time 25.9 (H) 11.4 - 15.2 seconds   INR 2.4 (H) 0.8 - 1.2    Comment: (NOTE) INR goal varies based on device and disease states. Performed at [facility], [address] [place], [place] [address]   Brain natriuretic peptide     Status: Abnormal   Collection Time: 10/20/18  6:37 PM  Result Value Ref Range   B Natriuretic Peptide 394.0 (H) 0.0 - 100.0 pg/mL    Comment: Performed at [facility], [address] [place], [place] [address]  SARS Coronavirus 2 [facility] order, Performed in [place] hospital lab)     Status: None   Collection Time: 10/20/18  6:40 PM  Result Value Ref Range   SARS Coronavirus 2 NEGATIVE NEGATIVE    Comment: (NOTE) If result is NEGATIVE SARS-CoV-2 target nucleic acids are NOT DETECTED. The SARS-CoV-2 RNA is generally detectable in upper and lower  respiratory specimens during the acute phase of infection. The lowest  concentration of SARS-CoV-2 viral copies this assay can detect is 250  copies / mL. A negative result does not preclude SARS-CoV-2 infection  and should not be used as the sole basis for treatment or other  patient management decisions.  A negative result may occur with  improper specimen collection /  handling, submission of specimen other  than nasopharyngeal swab, presence of viral mutation(s) within the  areas targeted by this assay, and inadequate number of viral copies  (<  250 copies / mL). A negative result must be combined with clinical  observations, patient history, and epidemiological information. If result is POSITIVE SARS-CoV-2 target nucleic acids are DETECTED. The SARS-CoV-2 RNA is generally detectable in upper and lower  respiratory specimens dur ing the acute phase of infection.  Positive  results are indicative of active infection with SARS-CoV-2.  Clinical  correlation with patient history and other diagnostic information is  necessary to determine patient infection status.  Positive results do  not rule out bacterial infection or co-infection with other viruses. If result is PRESUMPTIVE POSTIVE SARS-CoV-2 nucleic acids MAY BE PRESENT.   A presumptive positive result was obtained on the submitted specimen  and confirmed on repeat testing.  While 2019 novel coronavirus  (SARS-CoV-2) nucleic acids may be present in the submitted sample  additional confirmatory testing may be necessary for epidemiological  and / or clinical management purposes  to differentiate between  SARS-CoV-2 and other Sarbecovirus currently known to infect humans.  If clinically indicated additional testing with an alternate test  methodology [phone]) is advised. The SARS-CoV-2 RNA is generally  detectable in upper and lower respiratory sp ecimens during the acute  phase of infection. The expected result is Negative. Fact Sheet for Patients:  StrictlyIdeas.no Fact Sheet for Healthcare Providers: BankingDealers.co.za This test is not yet approved or cleared by the [place] FDA and has been authorized for detection and/or diagnosis of SARS-CoV-2 by FDA under an Emergency Use Authorization (EUA).  This EUA will remain in effect (meaning this  test can be used) for the duration of the COVID-19 declaration under Section 564(b)(1) of the Act, 21 U.S.C. section 360bbb-3(b)(1), unless the authorization is terminated or revoked sooner. Performed at [facility], [place], [place] [address]   Lactic acid, plasma     Status: Abnormal   Collection Time: 10/20/18  6:40 PM  Result Value Ref Range   Lactic Acid, Venous 2.6 (HH) 0.5 - 1.9 mmol/L    Comment: CRITICAL RESULT CALLED TO, READ BACK BY AND VERIFIED WITH JESSICA Gavino Fouch RN AT 1910 10/20/2018. MSS Performed at [facility], [address] [place], [place] [address]   Comprehensive metabolic panel     Status: Abnormal   Collection Time: 10/20/18  6:40 PM  Result Value Ref Range   Sodium 135 135 - 145 mmol/L   Potassium 4.4 3.5 - 5.1 mmol/L    Comment: HEMOLYSIS AT THIS LEVEL MAY AFFECT RESULT   Chloride 103 98 - 111 mmol/L   CO2 23 22 - 32 mmol/L   Glucose, Bld 119 (H) 70 - 99 mg/dL   BUN 27 (H) 8 - 23 mg/dL   Creatinine, Ser 1.24 0.61 - 1.24 mg/dL   Calcium 7.9 (L) 8.9 - 10.3 mg/dL   Total Protein 7.0 6.5 - 8.1 g/dL   Albumin 2.8 (L) 3.5 - 5.0 g/dL   AST 30 15 - 41 U/L   ALT 20 0 - 44 U/L   Alkaline Phosphatase 92 38 - 126 U/L   Total Bilirubin 0.4 0.3 - 1.2 mg/dL   GFR calc non Af Amer 51 (L) >60 mL/min   GFR calc Af Amer 59 (L) >60 mL/min   Anion gap 9 5 - 15    Comment: Performed at [facility], [address] [place], [place] [address]  Lactate dehydrogenase     Status: Abnormal   Collection Time: 10/20/18  6:40 PM  Result Value Ref Range   LDH 369 (H)  98 - 192 U/L    Comment: HEMOLYSIS AT THIS LEVEL MAY AFFECT RESULT Performed at [facility], [address] [place], [place] [address]   Ferritin     Status: None   Collection Time: 10/20/18  6:40 PM  Result Value Ref Range   Ferritin 305 24 - 336 ng/mL    Comment: Performed at [facility], [address] [place], [place] [address]   Triglycerides     Status: None   Collection Time: 10/20/18  6:40 PM  Result Value Ref Range   Triglycerides 84 <150 mg/dL    Comment: Performed at [facility], [address] [place], [place] [address]   Dg Chest Portable 1 View  Result Date: 10/20/2018 CLINICAL DATA:  Shortness of breath, hypoxia EXAM: PORTABLE CHEST 1 VIEW COMPARISON:  09/24/2018 FINDINGS: Bilateral diffuse interstitial and alveolar airspace opacities with a peripheral predominance. No pleural effusion or pneumothorax. Stable cardiomediastinal silhouette. No aggressive osseous lesion. IMPRESSION: 1. Chronic interstitial lung disease. 2. Increased bilateral interstitial and alveolar airspace opacities with a peripheral predominance. Differential considerations include multilobar pneumonia including atypical infection such as viral pneumonia. Electronically Signed   By: Kathreen Devoid   On: 10/20/2018 18:55    Pending Labs Unresulted Labs (From admission, onward)    Start     Ordered   10/20/18 1826  Blood culture (routine x 2)  BLOOD CULTURE X 2,   STAT     10/20/18 1826   10/20/18 1826  Lactic acid, plasma  Now then every 2 hours,   STAT     10/20/18 1826   10/20/18 1826  Procalcitonin  ONCE - STAT,   STAT     10/20/18 1826   10/20/18 1826  Fibrinogen  Once,   STAT     10/20/18 1826   10/20/18 1826  C-reactive protein  Once,   STAT     10/20/18 1826   10/20/18 1816  Urinalysis, Complete w Microscopic  ONCE - STAT,   STAT     10/20/18 1815   Signed and Held  Gram stain  Once,   R     Signed and Held   Signed and Held  Strep pneumoniae urinary antigen  Once,   R     Signed and Held   Signed and Held  Culture, sputum-assessment  Once,   R     Signed and Held   Signed and Held  Legionella Pneumophila Serogp 1 Ur Ag  Once,   R     Signed and Held          Vitals/Pain Today's Vitals   10/20/18 1902 10/20/18 1922 10/20/18 1953 10/20/18 2000  BP: 134/87   121/89  Pulse:  87 93 89  Resp:  (!) 22  (!)  23  Temp:      TempSrc:      SpO2:  94% 99% (!) 89%  Weight:      Height:      PainSc:        Isolation Precautions No active isolations  Medications Medications  vancomycin (VANCOCIN) IVPB 1000 mg/200 mL premix (1,000 mg Intravenous New Bag/Given 10/20/18 1920)  ceFEPIme (MAXIPIME) 1 g in sodium chloride 0.9 % 100 mL IVPB (0 g Intravenous Stopped 10/20/18 1958)  sodium chloride 0.9 % bolus 500 mL (0 mLs Intravenous Stopped 10/20/18 2004)    Mobility walks with person assist Low fall risk   Focused Assessments Pulmonary Assessment Handoff:  Lung sounds: Bilateral Breath  Sounds: Coarse crackles L Breath Sounds: Coarse crackles R Breath Sounds: Coarse crackles O2 Device: Nasal Cannula O2 Flow Rate (L/min): 4 L/min      R Recommendations: See Admitting Provider Note  Report given to:   Additional Notes: covid neg

## 2018-10-20 NOTE — ED Provider Notes (Signed)
[facility] Emergency Department Provider Note  ____________________________________________  Time seen: Approximately 6:39 PM  I have reviewed the triage vital signs and the nursing notes.   HISTORY  Chief Complaint Weakness   HPI Joseph Barton is a 85 y.o. male with a history of CHF with EF of 20 to 25%, A. fib on Coumadin, CAD, AAA status post repair in 2009 presents for evaluation of cough and generalized weakness.  Patient reports 3 days of cough productive of brown sputum, generalized weakness, and progressively worsening shortness of breath.  Patient is not on oxygen at home.  Patient was admitted to the hospital 2 months ago with pneumonia.  He reports full resolution of his symptoms.  Denies any known exposures or travel to endemic regions of COVID-19.  No vomiting or diarrhea, no chest pain, no fever, no abdominal pain.  Patient also reports decreased urine output.  He weighs himself every morning because of his history of CHF.  He reports unintentional 20 pound weight loss since being discharged from the hospital 2 months ago.  Endorses normal appetite.  No orthopnea.  Patient was found to be hypoxic per EMS and transported on 3 L nasal cannula.  Past Medical History:  Diagnosis Date  . Abdominal aortic aneurysm ([place]) 01-2004   4.7 x 4.7 cm.  Marland Kitchen CAD (coronary artery disease) 01/25/2004   a. 01/2004 Ant MI/DES to LAD;  b. 10/2011 Neg MV.  . CHF (congestive heart failure) ([place])   . Chronic systolic heart failure ([place])    a. 08/2016 Echo: EF 35%, nl RV fxn, mod to sev MR, mild to mod TR, mod biatrial enlargement.  . Hyperlipidemia   . Hypertension   . Ischemic cardiomyopathy    a. 08/2016 Echo: EF 35%.  . Moderate to Severe Mitral regurgitation    a. 08/2016 Echo: Mod-sev MR.  Marland Kitchen Permanent atrial fibrillation    a. Chronic coumadin (CHA2DS2VASc = 5).  . Pulmonary nodules    Noted on abdominal CT  . Statin intolerance     Patient Active Problem List   Diagnosis Date Noted  . Malnutrition of moderate degree 07/18/2018  . Pneumonia 07/15/2018  . Hypotension 12/12/2017  . CHF (congestive heart failure) ([place]) 05/28/2018  . Subclinical hypothyroidism 10/06/2015  . Allergic rhinitis 09/10/2015  . Bradycardia 09/10/2015  . Failure of erection 09/10/2015  . Blood glucose elevated 09/10/2015  . BP (high blood pressure) 09/10/2015  . Neuropathy 09/10/2015  . Apnea, sleep 09/10/2015  . Cancer of skin, squamous cell 09/10/2015  . Acid reflux 04/28/2015  . Arthritis, degenerative 12/25/2014  . Cardiomyopathy, ischemic 09/27/2013  . HLD (hyperlipidemia) 09/27/2013  . Lung nodule, multiple 09/27/2013  . Drug intolerance 09/27/2013  . Ischemic cardiomyopathy 12/01/2011  . Atrial fibrillation ([place]) 12/01/2011  . Chronic systolic heart failure ([place]) 12/01/2011  . IVCD (intraventricular conduction defect) 12/01/2011  . Intraventricular block 12/01/2011  . 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 CAD S/P percutaneous coronary angioplasty 01/25/2004  . Myocardial infarction ([place]) 01/25/2004  . AAA (abdominal aortic aneurysm) ([place]) 01/01/2004    Past Surgical History:  Procedure Laterality Date  . ABDOMINAL AORTIC ANEURYSM REPAIR     12/02/2007 [facility]  . ABDOMINAL AORTIC ANEURYSM REPAIR  2006   [facility]   . CARDIAC CATHETERIZATION  2009   [facility]  . CORONARY ANGIOPLASTY WITH STENT PLACEMENT  2005   Cypher stent LAD   . Cypher stents to LAD     01/25/2004  .  SKIN SURGERY  2016   [facility] skin cancer    Prior to Admission medications   Medication Sig Start Date End Date Taking? Authorizing Provider  albuterol (PROVENTIL HFA;VENTOLIN HFA) 108 (90 Base) MCG/ACT inhaler Inhale 2 puffs into the lungs every 6 (six) hours as needed for wheezing or shortness of breath. 11/29/17  Yes Jerrol Banana., MD  amiodarone (PACERONE) 200 MG tablet Take 1 tablet (200 mg) by mouth once daily 08/19/18  Yes Deboraha Sprang, MD  digoxin  (LANOXIN) 0.125 MG tablet Take 0.0625 mg by mouth daily.    Yes [provider]  furosemide (LASIX) 20 MG tablet Take 40 mg by mouth daily.   Yes [provider]  lisinopril (PRINIVIL,ZESTRIL) 2.5 MG tablet TAKE 1 TABLET BY MOUTH  DAILY 08/09/18  Yes Deboraha Sprang, MD  metoprolol succinate (TOPROL-XL) 25 MG 24 hr tablet TAKE ONE-HALF TABLET BY  MOUTH DAILY 08/09/18  Yes Deboraha Sprang, MD  omeprazole (PRILOSEC) 20 MG capsule TAKE 1 CAPSULE BY MOUTH  DAILY 02/16/18  Yes Jerrol Banana., MD  tamsulosin (FLOMAX) 0.4 MG CAPS capsule Take 1 capsule (0.4 mg total) by mouth daily. 07/29/18  Yes Jerrol Banana., MD  warfarin (COUMADIN) 2.5 MG tablet Take 1 tablet (2.5 mg total) by mouth as directed. Patient taking differently: Take  tablet (1.25MG ) by mouth Monday, Wednesday, Friday and take 1 tablet (2.5MG ) by mouth Tuesday, Thursday, Saturday and Sunday 10/03/17  Yes Jerrol Banana., MD  Bioflavonoid Products (BIOFLEX PO) Take 1 tablet by mouth daily.     [provider]  polyethylene glycol powder (GLYCOLAX) powder Take 1 scoop by mouth daily. 07/29/18   Jerrol Banana., MD  potassium chloride 20 MEQ TBCR Take 20 mEq by mouth daily. 12/06/17 07/30/18  Henreitta Leber, MD    Allergies Atorvastatin and Simvastatin  Family History  Problem Relation Age of Onset  . Heart attack Father 107       Died w/ Heart attack  . Diabetes Father   . Heart attack Brother   . Heart disease Brother   . Diabetes Sister   . Heart disease Sister     Social History Social History   Tobacco Use  . Smoking status: Former Smoker    Last attempt to quit: 12/01/1987    Years since quitting: 30.9  . Smokeless tobacco: Never Used  Substance Use Topics  . Alcohol use: No  . Drug use: No    Review of Systems  Constitutional: Negative for fever. + Generalized weakness, unintentional weight loss. Eyes: Negative for visual changes. ENT: Negative for sore throat.  Neck: No neck pain  Cardiovascular: Negative for chest pain. Respiratory: + shortness of breath, cough Gastrointestinal: Negative for abdominal pain, vomiting or diarrhea. Genitourinary: Negative for dysuria. + decreased UOP Musculoskeletal: Negative for back pain. Skin: Negative for rash. Neurological: Negative for headaches, weakness or numbness. Psych: No SI or HI  ____________________________________________   PHYSICAL EXAM:  VITAL SIGNS: Vitals:   10/20/18 1902 10/20/18 1922  BP: 134/87   Pulse:  87  Resp:  (!) 22  Temp:    SpO2:  94%   Constitutional: Alert and oriented, mild respiratory distress.  HEENT:      Head: Normocephalic and atraumatic.         Eyes: Conjunctivae are normal. Sclera is non-icteric.       Mouth/Throat: Mucous membranes are moist.       Neck: Supple with  no signs of meningismus. Cardiovascular: Irregularly irregular rhythm with normal rate. No murmurs, gallops, or rubs. 2+ symmetrical distal pulses are present in all extremities. No JVD. Respiratory: Increased work of breathing, tachypneic, hypoxic to 88% on room air which improved on 2 L nasal cannula, diffuse crackles bilaterally  gastrointestinal: Soft, non tender, and non distended with positive bowel sounds. No rebound or guarding. Musculoskeletal:  No edema, cyanosis, or erythema of extremities. Neurologic: Normal speech and language. Face is symmetric. Moving all extremities. No gross focal neurologic deficits are appreciated. Skin: Skin is warm, dry and intact. No rash noted. Psychiatric: Mood and affect are normal. Speech and behavior are normal.  ____________________________________________   LABS (all labs ordered are listed, but only abnormal results are displayed)  Labs Reviewed  BRAIN NATRIURETIC PEPTIDE - Abnormal; Notable for the following components:      Result Value   B Natriuretic Peptide 394.0 (*)    All other components within normal limits  DIGOXIN LEVEL - Abnormal;  Notable for the following components:   Digoxin Level 0.6 (*)    All other components within normal limits  PROTIME-INR - Abnormal; Notable for the following components:   Prothrombin Time 25.9 (*)    INR 2.4 (*)    All other components within normal limits  LACTIC ACID, PLASMA - Abnormal; Notable for the following components:   Lactic Acid, Venous 2.6 (*)    All other components within normal limits  COMPREHENSIVE METABOLIC PANEL - Abnormal; Notable for the following components:   Glucose, Bld 119 (*)    BUN 27 (*)    Calcium 7.9 (*)    Albumin 2.8 (*)    GFR calc non Af Amer 51 (*)    GFR calc Af Amer 59 (*)    All other components within normal limits  LACTATE DEHYDROGENASE - Abnormal; Notable for the following components:   LDH 369 (*)    All other components within normal limits  SARS CORONAVIRUS 2 (HOSPITAL ORDER, [facility])  CULTURE, BLOOD (ROUTINE X 2)  CULTURE, BLOOD (ROUTINE X 2)  TROPONIN I  CBC WITH DIFFERENTIAL/PLATELET  FERRITIN  TRIGLYCERIDES  URINALYSIS, COMPLETE (UACMP) WITH MICROSCOPIC  LACTIC ACID, PLASMA  PROCALCITONIN  FIBRINOGEN  C-REACTIVE PROTEIN   ____________________________________________  EKG  ED ECG REPORT I, Rudene Re, the attending physician, personally viewed and interpreted this ECG.  Atrial fibrillation with right bundle branch block, LAFB, rate of 93, prolonged QTC, left axis deviation, no ST elevations or depressions.  Unchanged from prior. ____________________________________________  RADIOLOGY  I have personally reviewed the images performed during this visit and I agree with the Radiologist's read.   Interpretation by Radiologist:  Dg Chest Portable 1 View  Result Date: 10/20/2018 CLINICAL DATA:  Shortness of breath, hypoxia EXAM: PORTABLE CHEST 1 VIEW COMPARISON:  09/24/2018 FINDINGS: Bilateral diffuse interstitial and alveolar airspace opacities with a peripheral predominance. No pleural  effusion or pneumothorax. Stable cardiomediastinal silhouette. No aggressive osseous lesion. IMPRESSION: 1. Chronic interstitial lung disease. 2. Increased bilateral interstitial and alveolar airspace opacities with a peripheral predominance. Differential considerations include multilobar pneumonia including atypical infection such as viral pneumonia. Electronically Signed   By: Kathreen Devoid   On: 10/20/2018 18:55      ____________________________________________   PROCEDURES  Procedure(s) performed: None Procedures Critical Care performed: yes  CRITICAL CARE Performed by: Rudene Re  ?  Total critical care time: 40 min  Critical care time was exclusive of separately billable procedures and treating  other patients.  Critical care was necessary to treat or prevent imminent or life-threatening deterioration.  Critical care was time spent personally by me on the following activities: development of treatment plan with patient and/or surrogate as well as nursing, discussions with consultants, evaluation of patient's response to treatment, examination of patient, obtaining history from patient or surrogate, ordering and performing treatments and interventions, ordering and review of laboratory studies, ordering and review of radiographic studies, pulse oximetry and re-evaluation of patient's condition.  ____________________________________________   INITIAL IMPRESSION / ASSESSMENT AND PLAN / ED COURSE  85 y.o. male with a history of CHF with EF of 20 to 25%, A. fib on Coumadin, CAD, AAA status post repair in 2009 presents for evaluation of productive cough, SOB, generalized weakness, unintentional weight loss, and decrease UOP.   Ddx PNA, pulmonary edema, Covid, lung cancer, dehydration, digoxin toxicity, sepsis  Patient arrives in mild respiratory distress, tachypneic and hypoxic requiring 2 L nasal cannula with diffuse crackles bilaterally.  He otherwise looks euvolemic.  He  has bilateral crackles.   Plan for CBC, CMP, lactic, blood cultures, troponin, BNP, chest x-ray, COVID screening.  EKG shows no acute ischemic changes.  Anticipate admission peer  Clinical Course as of Oct 20 1954  Sun Oct 20, 2018  1944 Chest x-ray concerning for atypical pneumonia.  COVID-19 is pending.  Lactic is slightly elevated 2.6 concerning for sepsis.  Patient was started on cefepime and vancomycin.  Will give gentle hydration due to elevated lactic acid of 500 cc due to very reduced EF.   [CV]  1955 COVID negative. Patient will be admitted for IV abx.    [CV]    Clinical Course User Index [CV] Alfred Levins [place], MD     As part of my medical decision making, I reviewed the following data within the [place] notes reviewed and incorporated, Labs reviewed , EKG interpreted , Old EKG reviewed, Old chart reviewed, Radiograph reviewed , Discussed with admitting physician , Notes from prior ED visits and [place] Controlled Substance Database    Pertinent labs & imaging results that were available during my care of the patient were reviewed by me and considered in my medical decision making (see chart for details).    ____________________________________________   FINAL CLINICAL IMPRESSION(S) / ED DIAGNOSES  Final diagnoses:  Acute respiratory failure with hypoxia ([place])  Healthcare-associated pneumonia      NEW MEDICATIONS STARTED DURING THIS VISIT:  ED Discharge Orders    None       Note:  This document was prepared using Dragon voice recognition software and may include unintentional dictation errors.    Alfred Levins, [place], MD 10/20/18 [phone]

## 2018-10-21 ENCOUNTER — Telehealth: Payer: Self-pay | Admitting: Family Medicine

## 2018-10-21 LAB — BASIC METABOLIC PANEL
Anion gap: 7 (ref 5–15)
BUN: 24 mg/dL — ABNORMAL HIGH (ref 8–23)
CO2: 21 mmol/L — ABNORMAL LOW (ref 22–32)
Calcium: 8 mg/dL — ABNORMAL LOW (ref 8.9–10.3)
Chloride: 108 mmol/L (ref 98–111)
Creatinine, Ser: 0.96 mg/dL (ref 0.61–1.24)
GFR calc Af Amer: >60 mL/min (ref >60)
GFR calc non Af Amer: >60 mL/min (ref >60)
Glucose, Bld: 112 mg/dL — ABNORMAL HIGH (ref 70–99)
Potassium: 4.5 mmol/L (ref 3.5–5.1)
Sodium: 136 mmol/L (ref 135–145)

## 2018-10-21 LAB — CBC
HCT: 38.2 % — ABNORMAL LOW (ref 39.0–52.0)
Hemoglobin: 12.6 g/dL — ABNORMAL LOW (ref 13.0–17.0)
MCH: 30.1 pg (ref 26.0–34.0)
MCHC: 33 g/dL (ref 30.0–36.0)
MCV: 91.4 fL (ref 80.0–100.0)
Platelets: 198 10*3/uL (ref 150–400)
RBC: 4.18 MIL/uL — ABNORMAL LOW (ref 4.22–5.81)
RDW: 14.3 % (ref 11.5–15.5)
WBC: 7.4 10*3/uL (ref 4.0–10.5)
nRBC: 0 % (ref 0.0–0.2)

## 2018-10-21 LAB — EXPECTORATED SPUTUM ASSESSMENT W GRAM STAIN, RFLX TO RESP C

## 2018-10-21 LAB — MRSA PCR SCREENING: MRSA by PCR: NEGATIVE

## 2018-10-21 LAB — GLUCOSE, CAPILLARY
Glucose-Capillary: 110 mg/dL — ABNORMAL HIGH (ref 70–99)
Glucose-Capillary: 138 mg/dL — ABNORMAL HIGH (ref 70–99)

## 2018-10-21 LAB — MAGNESIUM: Magnesium: 1.7 mg/dL (ref 1.7–2.4)

## 2018-10-21 LAB — PROTIME-INR
INR: 2.4 — ABNORMAL HIGH (ref 0.8–1.2)
Prothrombin Time: 25.7 seconds — ABNORMAL HIGH (ref 11.4–15.2)

## 2018-10-21 MED ORDER — ACETAMINOPHEN 325 MG PO TABS
650.0000 mg | ORAL_TABLET | Freq: Four times a day (QID) | ORAL | Status: DC | PRN
  Administered 2018-10-21 – 2018-10-26 (×5): 650 mg via ORAL
  Filled 2018-10-21 (×5): qty 2

## 2018-10-21 MED ORDER — ADULT MULTIVITAMIN W/MINERALS CH
1.0000 | ORAL_TABLET | Freq: Every day | ORAL | Status: DC
  Administered 2018-10-21 – 2018-11-01 (×13): 1 via ORAL
  Filled 2018-10-21 (×12): qty 1

## 2018-10-21 NOTE — Telephone Encounter (Signed)
Called wife and she would not go into detail with me about patient. She reports that she rather have Adriana or Dr. Rosanna Randy call her back. Please review. Thanks!

## 2018-10-21 NOTE — Progress Notes (Signed)
[place] [facility] [place] NAME: Joseph Barton    MR#:  [id number]  DATE OF BIRTH:  Sep 25, 1926  SUBJECTIVE:  CHIEF COMPLAINT:   Chief Complaint  Patient presents with  . Weakness   No new complaint this morning.  No fevers.  Shortness of breath improving.  REVIEW OF SYSTEMS:  Review of Systems  Constitutional: Negative for chills and fever.  HENT: Negative for hearing loss and tinnitus.   Eyes: Negative for blurred vision and double vision.  Respiratory: Positive for cough.        Shortness of breath improving  Cardiovascular: Negative for chest pain and palpitations.  Gastrointestinal: Negative for abdominal pain, heartburn, nausea and vomiting.  Genitourinary: Negative for dysuria and urgency.  Musculoskeletal: Negative for myalgias and neck pain.  Skin: Negative for itching and rash.  Neurological: Negative for dizziness and headaches.  Psychiatric/Behavioral: Negative for depression and hallucinations.    DRUG ALLERGIES:   Allergies  Allergen Reactions  . Atorvastatin   . Simvastatin    VITALS:  Blood pressure (!) 97/45, pulse (!) 59, temperature 98.6 F (37 C), temperature source Oral, resp. rate 18, height 6\' 2"  (1.88 m), weight 78.9 kg, SpO2 92 %. PHYSICAL EXAMINATION:   Physical Exam  Constitutional: He is oriented to person, place, and time. He appears well-developed and well-nourished.  HENT:  Head: Normocephalic and atraumatic.  Right Ear: External ear normal.  Eyes: Pupils are equal, round, and reactive to light. Conjunctivae are normal. Right eye exhibits no discharge. No scleral icterus.  Neck: Normal range of motion. Neck supple. No tracheal deviation present.  Cardiovascular:  Irregularly irregular  Respiratory: Effort normal. He has rales.  GI: Soft. Bowel sounds are normal. There is no abdominal tenderness.  Musculoskeletal: Normal range of motion.        General: No edema.     Comments: Chronic venous stasis  changes to both lower extremity.  Neurological: He is alert and oriented to person, place, and time. No cranial nerve deficit.  Skin: Skin is warm. He is not diaphoretic. No erythema.  Psychiatric: He has a normal mood and affect. His behavior is normal.   LABORATORY PANEL:  Male CBC Recent Labs  Lab 10/21/18 0757  WBC 7.4  HGB 12.6*  HCT 38.2*  PLT 198   ------------------------------------------------------------------------------------------------------------------ Chemistries  Recent Labs  Lab 10/20/18 1840 10/21/18 0757  NA 135 136  K 4.4 4.5  CL 103 108  CO2 23 21*  GLUCOSE 119* 112*  BUN 27* 24*  CREATININE 1.24 0.96  CALCIUM 7.9* 8.0*  MG  --  1.7  AST 30  --   ALT 20  --   ALKPHOS 92  --   BILITOT 0.4  --    RADIOLOGY:  Dg Chest Portable 1 View  Result Date: 10/20/2018 CLINICAL DATA:  Shortness of breath, hypoxia EXAM: PORTABLE CHEST 1 VIEW COMPARISON:  09/24/2018 FINDINGS: Bilateral diffuse interstitial and alveolar airspace opacities with a peripheral predominance. No pleural effusion or pneumothorax. Stable cardiomediastinal silhouette. No aggressive osseous lesion. IMPRESSION: 1. Chronic interstitial lung disease. 2. Increased bilateral interstitial and alveolar airspace opacities with a peripheral predominance. Differential considerations include multilobar pneumonia including atypical infection such as viral pneumonia. Electronically Signed   By: Kathreen Devoid   On: 10/20/2018 18:55   ASSESSMENT AND PLAN:  85 year old male patient with a known history of pneumonia in the past, chronic systolic heart failure with EF of 35%, coronary artery disease, abdominal aortic  aneurysm, hyperlipidemia, hypertension, mitral regurgitation, atrial fibrillation on Coumadin presented to emergency room for shortness of breath and cough.   1.Acute respiratory distress with hypoxia Secondary to pneumonia Continue oxygen via nasal cannula .  Patient currently requiring 6 L of  oxygen with oxygen saturation of 92%...  Wean down oxygen requirement as tolerated   2.Multilobar pneumonia Healthcare associated pneumonia Continue IV vancomycin and cefepime antibiotics with plans to de-escalate in a.m. if patient shows clinical improvement. Follow-up cultures Coronavirus test negative Mild elevation in lactic acid level of 2.6 which trended down.  Likely due to dehydration.  Patient does not appear septic clinically.  3.Chronic atrial fibrillation INR therapeutic Continue Coumadin for anticoagulation Continue oral amiodarone Continue digoxin for rate control  4. Hypertension Continue ACE inhibitor  DVT prophylaxis; patient already on anticoagulation with Coumadin  I called patient's spouse listed in chart Ms. Santina Evans.  Updated her on patient's clinical condition.  All questions were answered and she is in agreement with the plan of care.  All the records are reviewed and case discussed with Care Management/Social Worker. Management plans discussed with the patient, and they are in agreement.  CODE STATUS: DNR  TOTAL TIME TAKING CARE OF THIS PATIENT: 35 minutes.   More than 50% of the time was spent in counseling/coordination of care: YES  POSSIBLE D/C IN 3 DAYS, DEPENDING ON CLINICAL CONDITION.   Pacey Willadsen M.D on 10/21/2018 at 12:49 PM  Between 7am to 6pm - Pager - [phone]  After 6pm go to [email or web address] - password [email or web address] [facility]  [facility]  Office  [phone]  CC: Primary care physician; Jerrol Banana., MD  Note: This dictation was prepared with Dragon dictation along with smaller phrase technology. Any transcriptional errors that result from this process are unintentional.

## 2018-10-21 NOTE — Telephone Encounter (Signed)
He is in the hospital.  We will have to wait see how treatment goes.  Family was is trying to avoid the hospital but he has pneumonia and heart failure needs to be there.  We will follow-up after the hospital visit.  No calls would  be helpful at this time.

## 2018-10-21 NOTE — Progress Notes (Addendum)
MD paged to notify about patients blood pressure readings and heart rate. (see flowsheet). Also notified patient had 3 beats of wide QRS per CCMD. Per Dr. Stark Jock hold all oral blood pressure medications for right now. Will do so and continue to monitor patient. Patient asymptomatic from low heart rate. Laying in bed with no complaints.

## 2018-10-21 NOTE — Progress Notes (Signed)
Initial Nutrition Assessment  DOCUMENTATION CODES:   Not applicable  INTERVENTION:   Magic cup TID with meals, each supplement provides 290 kcal and 9 grams of protein  MVI daily   Liberalize diet   NUTRITION DIAGNOSIS:   Increased nutrient needs related to acute illness as evidenced by increased estimated needs.  GOAL:   Patient will meet greater than or equal to 90% of their needs  MONITOR:   PO intake, Supplement acceptance, Labs, Weight trends, I & O's  REASON FOR ASSESSMENT:   Malnutrition Screening Tool    ASSESSMENT:   85 y.o. male with a known history of pneumonia in the past, chronic systolic heart failure with EF of 35%, coronary artery disease, abdominal aortic aneurysm, hyperlipidemia, hypertension, mitral regurgitation, atrial fibrillation on Coumadin admitted with HCAP  RD working remotely.  Per chart review, pt reports good appetite and oral intake at home; reports his wife prepares his meals. Pt reports a 20lb weight loss over the past 2 months; per chart, pt has lost 20lbs(10%) over the past 4 months which is significant. Pt's UBW is 190-195lbs. Pt does not like ONS but is willing to eat Magic Cups. RD will liberalize pt's diet and add Magic Cups to meal trays. Pt documented to be eating 100% of meals currently.   Pt likely at high risk for malnutrition but unable to diagnose at this time  Medications reviewed and include: protonix, warfarin, NaCl @50ml /hr, cefepime, vancomycin   Labs reviewed: K 4.5 wnl, Mg 1.7 wnl BNP- 394(H)- 4/19  Unable to complete Nutrition-Focused physical exam at this time.   Diet Order:   Diet Order            Diet Heart Room service appropriate? Yes; Fluid consistency: Thin  Diet effective now             EDUCATION NEEDS:   Not appropriate for education at this time  Skin:  Skin Assessment: Reviewed RN Assessment(ecchymosis )  Last BM:  4/18  Height:   Ht Readings from Last 1 Encounters:  10/20/18 6\' 2"   (1.88 m)    Weight:   Wt Readings from Last 1 Encounters:  10/20/18 78.9 kg    Ideal Body Weight:  86.3 kg  BMI:  Body mass index is 22.34 kg/m.  Estimated Nutritional Needs:   Kcal:  2000-2300kcal/day   Protein:  100-115g/day  Fluid:  2L/day   Koleen Distance MS, RD, LDN Pager #- [phone] Office#- [phone] After Hours Pager: [phone]

## 2018-10-21 NOTE — Progress Notes (Signed)
Patient complaining of mild headache. No PRN orders, Dr. Stark Jock paged. Verbal orders with read back to administer tylenol 650 mg Q6hrs PRN.  Will administer and continue to monitor.

## 2018-10-21 NOTE — Telephone Encounter (Signed)
Pt wife would like for a nurse to call regarding Bettie.  She said he has been sick for weeks and would just like someone to call her back.  She would not specify why  CB# [phone]  Thanks teri

## 2018-10-21 NOTE — Progress Notes (Signed)
MD paged to ask about patients IV fluid orders. Patient has a hx of CHF and has continuous fluids going. Verbal orders from MD to discontinue fluids now.

## 2018-10-22 ENCOUNTER — Other Ambulatory Visit: Payer: Self-pay

## 2018-10-22 ENCOUNTER — Inpatient Hospital Stay: Payer: Medicare Other

## 2018-10-22 LAB — TROPONIN I: Troponin I: <0.03 ng/mL (ref <0.03)

## 2018-10-22 LAB — PROTIME-INR
INR: 2.5 — ABNORMAL HIGH (ref 0.8–1.2)
Prothrombin Time: 26.8 seconds — ABNORMAL HIGH (ref 11.4–15.2)

## 2018-10-22 LAB — MAGNESIUM: Magnesium: 1.7 mg/dL (ref 1.7–2.4)

## 2018-10-22 LAB — BASIC METABOLIC PANEL
Anion gap: 10 (ref 5–15)
BUN: 21 mg/dL (ref 8–23)
CO2: 23 mmol/L (ref 22–32)
Calcium: 8.2 mg/dL — ABNORMAL LOW (ref 8.9–10.3)
Chloride: 102 mmol/L (ref 98–111)
Creatinine, Ser: 1.08 mg/dL (ref 0.61–1.24)
GFR calc Af Amer: >60 mL/min (ref >60)
GFR calc non Af Amer: >60 mL/min (ref >60)
Glucose, Bld: 126 mg/dL — ABNORMAL HIGH (ref 70–99)
Potassium: 4.5 mmol/L (ref 3.5–5.1)
Sodium: 135 mmol/L (ref 135–145)

## 2018-10-22 LAB — LEGIONELLA PNEUMOPHILA SEROGP 1 UR AG: L. pneumophila Serogp 1 Ur Ag: NEGATIVE

## 2018-10-22 MED ORDER — POLYETHYLENE GLYCOL 3350 17 G PO PACK
17.0000 g | PACK | Freq: Every day | ORAL | Status: DC
  Administered 2018-10-22 – 2018-11-01 (×11): 17 g via ORAL
  Filled 2018-10-22 (×11): qty 1

## 2018-10-22 MED ORDER — IPRATROPIUM-ALBUTEROL 0.5-2.5 (3) MG/3ML IN SOLN
3.0000 mL | RESPIRATORY_TRACT | Status: DC
  Administered 2018-10-22 – 2018-10-24 (×7): 3 mL via RESPIRATORY_TRACT
  Filled 2018-10-22 (×8): qty 3

## 2018-10-22 MED ORDER — DOCUSATE SODIUM 100 MG PO CAPS
200.0000 mg | ORAL_CAPSULE | Freq: Two times a day (BID) | ORAL | Status: DC
  Administered 2018-10-22 – 2018-11-01 (×20): 200 mg via ORAL
  Filled 2018-10-22 (×21): qty 2

## 2018-10-22 MED ORDER — FUROSEMIDE 10 MG/ML IJ SOLN
40.0000 mg | Freq: Once | INTRAMUSCULAR | Status: AC
  Administered 2018-10-22: 19:00:00 40 mg via INTRAVENOUS
  Filled 2018-10-22: qty 4

## 2018-10-22 MED ORDER — LORAZEPAM 2 MG/ML IJ SOLN
0.5000 mg | Freq: Once | INTRAMUSCULAR | Status: AC
  Administered 2018-10-22: 0.5 mg via INTRAVENOUS
  Filled 2018-10-22: qty 1

## 2018-10-22 MED ORDER — GUAIFENESIN-DM 100-10 MG/5ML PO SYRP
5.0000 mL | ORAL_SOLUTION | ORAL | Status: DC | PRN
  Administered 2018-10-22 – 2018-10-30 (×3): 5 mL via ORAL
  Filled 2018-10-22 (×4): qty 5

## 2018-10-22 MED ORDER — METHYLPREDNISOLONE SODIUM SUCC 125 MG IJ SOLR
60.0000 mg | Freq: Once | INTRAMUSCULAR | Status: AC
  Administered 2018-10-22: 60 mg via INTRAVENOUS
  Filled 2018-10-22: qty 2

## 2018-10-22 MED ORDER — SODIUM CHLORIDE 0.9 % IV SOLN
2.0000 g | Freq: Two times a day (BID) | INTRAVENOUS | Status: DC
  Administered 2018-10-22 – 2018-10-23 (×2): 2 g via INTRAVENOUS
  Filled 2018-10-22 (×3): qty 2

## 2018-10-22 MED ORDER — BENZONATATE 100 MG PO CAPS
100.0000 mg | ORAL_CAPSULE | Freq: Three times a day (TID) | ORAL | Status: DC | PRN
  Administered 2018-10-22 – 2018-10-30 (×5): 100 mg via ORAL
  Filled 2018-10-22 (×7): qty 1

## 2018-10-22 NOTE — Progress Notes (Signed)
Pt c/o SOB and that he couldn't catch his breath. O2 94% on 6L. Lung sound diminished but clear. Breathing treatment given with minimal relief.  MD made aware. 0.5 mg ativan given, EKG and troponin ordered. MD made aware of EKG, troponin negative.

## 2018-10-22 NOTE — Progress Notes (Addendum)
Dr. Stark Jock updated on patiens blood pressure and heart rate this morning. He has a blood pressure of 107/56 and heart rate has been in the 70s-80s. Verbal orders by MD to give scheduled amiodaron and hold lisinopril, metoprolol, and digoxin. Patient up in the chair, PRN medication given for cough.

## 2018-10-22 NOTE — Progress Notes (Signed)
[facility] NAME: Joseph Barton    MR#:  [id number]  DATE OF BIRTH:  03-06-1927  SUBJECTIVE:  CHIEF COMPLAINT:   Chief Complaint  Patient presents with  . Weakness   Patient reported to have had shortness of breath last night.  Feeling better this morning.  Oxygen saturation noted to be 94% on 6 L this morning.  Still having some intermittent cough.  PRN Tessalon initiated.  REVIEW OF SYSTEMS:  Review of Systems  Constitutional: Negative for chills and fever.  HENT: Negative for hearing loss and tinnitus.   Eyes: Negative for blurred vision and double vision.  Respiratory: Positive for cough.        Shortness of breath improving  Cardiovascular: Negative for chest pain and palpitations.  Gastrointestinal: Negative for abdominal pain, heartburn, nausea and vomiting.  Genitourinary: Negative for dysuria and urgency.  Musculoskeletal: Negative for myalgias and neck pain.  Skin: Negative for itching and rash.  Neurological: Negative for dizziness and headaches.  Psychiatric/Behavioral: Negative for depression and hallucinations.    DRUG ALLERGIES:   Allergies  Allergen Reactions  . Atorvastatin   . Simvastatin    VITALS:  Blood pressure (!) 107/56, pulse (!) 54, temperature 97.7 F (36.5 C), resp. rate 18, height 6\' 2"  (1.88 m), weight 81.2 kg, SpO2 94 %. PHYSICAL EXAMINATION:   Physical Exam  Constitutional: He is oriented to person, place, and time. He appears well-developed and well-nourished.  HENT:  Head: Normocephalic and atraumatic.  Right Ear: External ear normal.  Eyes: Pupils are equal, round, and reactive to light. Conjunctivae are normal. Right eye exhibits no discharge. No scleral icterus.  Neck: Normal range of motion. Neck supple. No tracheal deviation present.  Cardiovascular:  Irregularly irregular  Respiratory: Effort normal. He has rales.  GI: Soft. Bowel sounds are normal. There is no abdominal  tenderness.  Musculoskeletal: Normal range of motion.        General: No edema.     Comments: Chronic venous stasis changes to both lower extremity.  Neurological: He is alert and oriented to person, place, and time. No cranial nerve deficit.  Skin: Skin is warm. He is not diaphoretic. No erythema.  Psychiatric: He has a normal mood and affect. His behavior is normal.   LABORATORY PANEL:  Male CBC Recent Labs  Lab 10/21/18 0757  WBC 7.4  HGB 12.6*  HCT 38.2*  PLT 198   ------------------------------------------------------------------------------------------------------------------ Chemistries  Recent Labs  Lab 10/20/18 1840  10/22/18 0300  NA 135   < > 135  K 4.4   < > 4.5  CL 103   < > 102  CO2 23   < > 23  GLUCOSE 119*   < > 126*  BUN 27*   < > 21  CREATININE 1.24   < > 1.08  CALCIUM 7.9*   < > 8.2*  MG  --    < > 1.7  AST 30  --   --   ALT 20  --   --   ALKPHOS 92  --   --   BILITOT 0.4  --   --    < > = values in this interval not displayed.   RADIOLOGY:  No results found. ASSESSMENT AND PLAN:  85 year old male patient with a known history of pneumonia in the past, chronic systolic heart failure with EF of 35%, coronary artery disease, abdominal aortic aneurysm, hyperlipidemia, hypertension, mitral regurgitation, atrial fibrillation on Coumadin  presented to emergency room for shortness of breath and cough.   1.Acute respiratory distress with hypoxia Secondary to pneumonia Continue oxygen via nasal cannula .  Patient currently requiring 6 L of oxygen with oxygen saturation of 94%...  Wean down oxygen requirement as tolerated   2.Multilobar pneumonia Healthcare associated pneumonia Patient was initially placed on broad-spectrum IV antibiotics with IV vancomycin and cefepime.  Sputum MRSA by PCR came back negative.  Vancomycin discontinued this morning.  Continue IV cefepime with plans to de-escalate to oral antibiotics when patient more clinically stable and  possibly weaned off oxygen So far no growth on blood cultures. Coronavirus test negative Mild elevation in lactic acid level of 2.6 which trended down.  Likely due to dehydration.  Patient does not appear septic clinically.  PRN Tessalon for cough  3.Chronic atrial fibrillation INR therapeutic Continue Coumadin for anticoagulation Digoxin and amiodarone was held this morning due to borderline blood pressure.  Patient also on metoprolol.   4. Hypertension Blood pressure noted to be running borderline.  Holding off on lisinopril.  DVT prophylaxis; patient already on anticoagulation with Coumadin  I called patient's spouse listed in chart Ms. Santina Evans.  Updated her on patient's clinical condition.  All questions were answered and she is in agreement with the plan of care.  All the records are reviewed and case discussed with Care Management/Social Worker. Management plans discussed with the patient, and they are in agreement.  CODE STATUS: DNR  TOTAL TIME TAKING CARE OF THIS PATIENT: 36 minutes.   More than 50% of the time was spent in counseling/coordination of care: YES  POSSIBLE D/C IN 2 DAYS, DEPENDING ON CLINICAL CONDITION.   Gustavo Meditz M.D on 10/22/2018 at 9:46 AM  Between 7am to 6pm - Pager - [phone]  After 6pm go to [email or web address] - password [email or web address] [facility]  [employer] [place] [facility]  Office  [phone]  CC: Primary care physician; Jerrol Banana., MD  Note: This dictation was prepared with Dragon dictation along with smaller phrase technology. Any transcriptional errors that result from this process are unintentional.

## 2018-10-22 NOTE — Progress Notes (Signed)
Patient started having some shortness of breath with chills. No pain. Oxygen saturation decreased. Respiratory therapist called as well as on call MD. Patient put on non-rebreather with oxygen saturation of 92%. Dr. Vianne Bulls put in new orders. IV lasix and IV solumedrol given. Chest x-ray being done now. Will continue to monitor patient.

## 2018-10-23 ENCOUNTER — Inpatient Hospital Stay (HOSPITAL_COMMUNITY)
Admit: 2018-10-23 | Discharge: 2018-10-23 | Disposition: A | Discharge status: Home / Self Care | Payer: Medicare Other | Attending: Internal Medicine | Admitting: Internal Medicine

## 2018-10-23 ENCOUNTER — Inpatient Hospital Stay: Payer: Medicare Other

## 2018-10-23 DIAGNOSIS — Z8701 Personal history of pneumonia (recurrent): Secondary | ICD-10-CM

## 2018-10-23 DIAGNOSIS — I509 Heart failure, unspecified: Secondary | ICD-10-CM

## 2018-10-23 DIAGNOSIS — I4891 Unspecified atrial fibrillation: Secondary | ICD-10-CM

## 2018-10-23 DIAGNOSIS — R531 Weakness: Secondary | ICD-10-CM

## 2018-10-23 DIAGNOSIS — I340 Nonrheumatic mitral (valve) insufficiency: Secondary | ICD-10-CM

## 2018-10-23 DIAGNOSIS — Z888 Allergy status to other drugs, medicaments and biological substances status: Secondary | ICD-10-CM

## 2018-10-23 DIAGNOSIS — I361 Nonrheumatic tricuspid (valve) insufficiency: Secondary | ICD-10-CM

## 2018-10-23 DIAGNOSIS — R0902 Hypoxemia: Secondary | ICD-10-CM

## 2018-10-23 DIAGNOSIS — Z87891 Personal history of nicotine dependence: Secondary | ICD-10-CM

## 2018-10-23 DIAGNOSIS — J432 Centrilobular emphysema: Secondary | ICD-10-CM

## 2018-10-23 DIAGNOSIS — R918 Other nonspecific abnormal finding of lung field: Secondary | ICD-10-CM

## 2018-10-23 DIAGNOSIS — Z7901 Long term (current) use of anticoagulants: Secondary | ICD-10-CM

## 2018-10-23 LAB — BASIC METABOLIC PANEL
Anion gap: 10 (ref 5–15)
BUN: 25 mg/dL — ABNORMAL HIGH (ref 8–23)
CO2: 21 mmol/L — ABNORMAL LOW (ref 22–32)
Calcium: 8.3 mg/dL — ABNORMAL LOW (ref 8.9–10.3)
Chloride: 102 mmol/L (ref 98–111)
Creatinine, Ser: 1.14 mg/dL (ref 0.61–1.24)
GFR calc Af Amer: >60 mL/min (ref >60)
GFR calc non Af Amer: 56 mL/min — ABNORMAL LOW (ref >60)
Glucose, Bld: 205 mg/dL — ABNORMAL HIGH (ref 70–99)
Potassium: 4.3 mmol/L (ref 3.5–5.1)
Sodium: 133 mmol/L — ABNORMAL LOW (ref 135–145)

## 2018-10-23 LAB — RESPIRATORY PANEL BY PCR

## 2018-10-23 LAB — TSH: TSH: 3.048 u[IU]/mL (ref 0.350–4.500)

## 2018-10-23 LAB — ECHOCARDIOGRAM COMPLETE
Height: 74 in
Weight: 2814.4 oz

## 2018-10-23 LAB — MAGNESIUM: Magnesium: 2 mg/dL (ref 1.7–2.4)

## 2018-10-23 LAB — CBC
HCT: 37.6 % — ABNORMAL LOW (ref 39.0–52.0)
Hemoglobin: 12.4 g/dL — ABNORMAL LOW (ref 13.0–17.0)
MCH: 29.9 pg (ref 26.0–34.0)
MCHC: 33 g/dL (ref 30.0–36.0)
MCV: 90.6 fL (ref 80.0–100.0)
Platelets: 192 10*3/uL (ref 150–400)
RBC: 4.15 MIL/uL — ABNORMAL LOW (ref 4.22–5.81)
RDW: 14.1 % (ref 11.5–15.5)
WBC: 7.7 10*3/uL (ref 4.0–10.5)
nRBC: 0 % (ref 0.0–0.2)

## 2018-10-23 LAB — PROTIME-INR
INR: 2.6 — ABNORMAL HIGH (ref 0.8–1.2)
Prothrombin Time: 27.8 seconds — ABNORMAL HIGH (ref 11.4–15.2)

## 2018-10-23 MED ORDER — FUROSEMIDE 10 MG/ML IJ SOLN: 40.0000 mg | Freq: Every day | INTRAMUSCULAR | Status: DC

## 2018-10-23 MED ORDER — FUROSEMIDE 10 MG/ML IJ SOLN
40.0000 mg | Freq: Two times a day (BID) | INTRAMUSCULAR | Status: DC
  Administered 2018-10-23 – 2018-10-24 (×2): 40 mg via INTRAVENOUS
  Filled 2018-10-23 (×2): qty 4

## 2018-10-23 MED ORDER — PREDNISONE 20 MG PO TABS
40.0000 mg | ORAL_TABLET | Freq: Every day | ORAL | Status: DC
  Administered 2018-10-23 – 2018-11-01 (×10): 40 mg via ORAL
  Filled 2018-10-23 (×10): qty 2

## 2018-10-23 MED ORDER — VANCOMYCIN HCL 10 G IV SOLR
1250.0000 mg | INTRAVENOUS | Status: DC
  Administered 2018-10-23: 11:00:00 1250 mg via INTRAVENOUS
  Filled 2018-10-23: qty 1250

## 2018-10-23 NOTE — Consult Note (Signed)
Pharmacy Antibiotic Note  Joseph Barton is a 85 y.o. male admitted on 10/20/2018 with pneumonia.  Pharmacy has been consulted for vancomycin dosing.  Plan: Patient received vancomycin 1750 mg x 1 on 4/21. Will start a maintenance dose of vancomycin 1250 mg q24H. Goal AUC 400-550. Predicted AUC 473. MRSA PCR was negative (negative predictive value of 99.2%) and vancomycin was d/c'ed but pt is worsening and medical team would like to restart vancomycin.   Height: 6\' 2"  (188 cm) Weight: 175 lb 14.4 oz (79.8 kg) IBW/kg (Calculated) : 82.2  Temp (24hrs), Avg:97.7 F (36.5 C), Min:97.5 F (36.4 C), Max:98.4 F (36.9 C)  Recent Labs  Lab 10/20/18 1836 10/20/18 1840 10/20/18 2250 10/21/18 0757 10/22/18 0300 10/23/18 0326  WBC 8.8  --   --  7.4  --  7.7  CREATININE  --  1.24  --  0.96 1.08 1.14  LATICACIDVEN  --  2.6* 2.4*  --   --   --     Estimated Creatinine Clearance: 48.6 mL/min (by C-G formula based on SCr of 1.14 mg/dL).    Allergies  Allergen Reactions  . Atorvastatin   . Simvastatin     Antimicrobials this admission: 4/19 cefepime >>  4/19 vancomycin >>   Dose adjustments this admission: None   Microbiology results: 4/19 BCx: pending  4/20 MRSA PCR: negative  Thank you for allowing pharmacy to be a part of this patient's care.  Oswald Hillock, PharmD, BCPS Clinical Pharmacist  10/23/2018 9:37 AM

## 2018-10-23 NOTE — Progress Notes (Signed)
*  PRELIMINARY RESULTS* Echocardiogram 2D Echocardiogram has been performed.  Joseph Barton 10/23/2018, 3:33 PM

## 2018-10-23 NOTE — Consult Note (Signed)
NAME: Joseph Barton  DOB: [birth year]  MRN: [id number]  Date/Time: 10/23/2018 12:13 PM  REQUESTING PROVIDER:ojie Subjective:  REASON FOR CONSULT: pneumonia ? Shiraz CHADRICK SPRINKLE is a 85 y.o. male with a history of CHF, AFIB on coumadin, AAA repair in 2009. Presented  with sob and feeling weak Pt apparently had pulse ox of 83% at home. When EMS was called his vitals were okay and EKG had showed Afib  Pt says he has been having some weakness for the past 2-3 weeks intermittently. HE was in [facility]  In jan 2020 admitted for 6 days for Afib and sob and was started on Amiodarone 1/15 received IV amiodarone drip and then discharged on 1/18  400mg  BID for 30 days and then on 200mg  once a daily. He was also treated for pneumonia then Pt was continuing to have SOB and an CXR done on 3/24 did not show any pneumonia. Pt insisted on getting antibiotics but was not given rightfully by the PA-C.  He does not complain of any fever or chills  In the ED his vitals were temp of 98.3, 134/87, HR 79, RR 32. Pulse ox 97% on oxygen. He had  CXR which shwoed b/l diffuse infiltrates- COVID test was neg- he was started on avnco and cefepime . As his breathing was not getting better a repeat CXR was done which was similar to the first and CT scan and I am asked to see the patient for pneumonia  PMH Afib AAA Pulmonary nodules MI Hyperlipidemia Cardiomyopathy/CHF     Past Surgical History:  Procedure Laterality Date  . ABDOMINAL AORTIC ANEURYSM REPAIR     12/02/2007 [facility]  . ABDOMINAL AORTIC ANEURYSM REPAIR  2006   [facility]   . CARDIAC CATHETERIZATION  2009   [facility]  . CORONARY ANGIOPLASTY WITH STENT PLACEMENT  2005   Cypher stent LAD   . Cypher stents to LAD     01/25/2004  . SKIN SURGERY  2016   [facility] skin cancer    Social History   Socioeconomic History  . Marital status: Married    Spouse name: Not on file  . Number of children: 1  . Years of education: Not on file  . Highest education level: Not on  file  Occupational History  . Not on file  Social Needs  . Financial resource strain: Not on file  . Food insecurity:    Worry: Not on file    Inability: Not on file  . Transportation needs:    Medical: Not on file    Non-medical: Not on file  Tobacco Use  . Smoking status: Former Smoker    Last attempt to quit: 12/01/1987    Years since quitting: 30.9  . Smokeless tobacco: Never Used  Substance and Sexual Activity  . Alcohol use: No  . Drug use: No  . Sexual activity: Not Currently  Lifestyle  . Physical activity:    Days per week: Not on file    Minutes per session: Not on file  . Stress: Not on file  Relationships  . Social connections:    Talks on phone: Not on file    Gets together: Not on file    Attends religious service: Not on file    Active member of club or organization: Not on file    Attends meetings of clubs or organizations: Not on file    Relationship status: Not on file  . Intimate partner violence:    Fear of current  or ex partner: Not on file    Emotionally abused: Not on file    Physically abused: Not on file    Forced sexual activity: Not on file  Other Topics Concern  . Not on file  Social History Narrative   Lives at home with his family.  Independent at baseline    Family History  Problem Relation Age of Onset  . Heart attack Father 23       Died w/ Heart attack  . Diabetes Father   . Heart attack Brother   . Heart disease Brother   . Diabetes Sister   . Heart disease Sister    Allergies  Allergen Reactions  . Atorvastatin   . Simvastatin    ? Current Facility-Administered Medications  Medication Dose Route Frequency Provider Last Rate Last Dose  . 0.9 %  sodium chloride infusion  250 mL Intravenous PRN Saundra Shelling, MD   Stopped at 10/22/18 1845  . acetaminophen (TYLENOL) tablet 650 mg  650 mg Oral Q6H PRN Stark Jock, Jude, MD   650 mg at 10/21/18 1757  . albuterol (PROVENTIL) (2.5 MG/3ML) 0.083% nebulizer solution 2.5 mg  2.5 mg  Inhalation Q6H PRN Saundra Shelling, MD   2.5 mg at 10/22/18 0240  . amiodarone (PACERONE) tablet 200 mg  200 mg Oral Daily Pyreddy, Pavan, MD   200 mg at 10/23/18 1046  . benzonatate (TESSALON) capsule 100 mg  100 mg Oral TID PRN Stark Jock, Jude, MD   100 mg at 10/22/18 0833  . ceFEPIme (MAXIPIME) 2 g in sodium chloride 0.9 % 100 mL IVPB  2 g Intravenous Q12H Saundra Shelling, MD   Stopped at 10/23/18 0600  . digoxin (LANOXIN) tablet 0.0625 mg  0.0625 mg Oral Daily Pyreddy, Pavan, MD   0.0625 mg at 10/23/18 1045  . docusate sodium (COLACE) capsule 200 mg  200 mg Oral BID Harrie Foreman, MD   200 mg at 10/23/18 1045  . furosemide (LASIX) injection 40 mg  40 mg Intravenous Daily Ojie, Jude, MD      . guaiFENesin-dextromethorphan (ROBITUSSIN DM) 100-10 MG/5ML syrup 5 mL  5 mL Oral Q4H PRN Stark Jock, Jude, MD   5 mL at 10/22/18 1609  . ipratropium-albuterol (DUONEB) 0.5-2.5 (3) MG/3ML nebulizer solution 3 mL  3 mL Nebulization Q4H Epifanio Lesches, MD   3 mL at 10/23/18 1130  . metoprolol succinate (TOPROL-XL) 24 hr tablet 12.5 mg  12.5 mg Oral Daily Pyreddy, Pavan, MD   12.5 mg at 10/23/18 1046  . multivitamin with minerals tablet 1 tablet  1 tablet Oral Daily Ojie, Jude, MD   1 tablet at 10/23/18 1046  . pantoprazole (PROTONIX) EC tablet 40 mg  40 mg Oral Daily Pyreddy, Pavan, MD   40 mg at 10/23/18 1045  . polyethylene glycol (MIRALAX / GLYCOLAX) packet 17 g  17 g Oral Daily Harrie Foreman, MD   17 g at 10/23/18 1046  . sodium chloride flush (NS) 0.9 % injection 3 mL  3 mL Intravenous Q12H Pyreddy, Pavan, MD   3 mL at 10/23/18 1050  . sodium chloride flush (NS) 0.9 % injection 3 mL  3 mL Intravenous PRN Pyreddy, Reatha Harps, MD      . tamsulosin (FLOMAX) capsule 0.4 mg  0.4 mg Oral Daily Pyreddy, Pavan, MD   0.4 mg at 10/23/18 1046  . vancomycin (VANCOCIN) 1,250 mg in sodium chloride 0.9 % 250 mL IVPB  1,250 mg Intravenous Q24H Ojie, Jude, MD 166.7 mL/hr at 10/23/18 1049 1,250  mg at 10/23/18 1049  .  warfarin (COUMADIN) tablet 1.25 mg  1.25 mg Oral Q M,W,F-1800 Pyreddy, Pavan, MD   1.25 mg at 10/21/18 1741  . warfarin (COUMADIN) tablet 2.5 mg  2.5 mg Oral Q T,Th,S,Su-1800 Pyreddy, Pavan, MD   2.5 mg at 10/22/18 1758  . Warfarin - Physician Dosing Inpatient   Does not apply q1800 Pyreddy, Reatha Harps, MD         Abtx:  Anti-infectives (From admission, onward)   Start     Dose/Rate Route Frequency Ordered Stop   10/23/18 1000  vancomycin (VANCOCIN) 1,250 mg in sodium chloride 0.9 % 250 mL IVPB     1,250 mg 166.7 mL/hr over 90 Minutes Intravenous Every 24 hours 10/23/18 0943     10/22/18 1800  ceFEPIme (MAXIPIME) 2 g in sodium chloride 0.9 % 100 mL IVPB     2 g 200 mL/hr over 30 Minutes Intravenous Every 12 hours 10/22/18 0505 10/29/18 0559   10/22/18 0700  vancomycin (VANCOCIN) 1,750 mg in sodium chloride 0.9 % 500 mL IVPB  Status:  Discontinued     1,750 mg 250 mL/hr over 120 Minutes Intravenous Every 36 hours 10/20/18 2030 10/22/18 0912   10/21/18 0700  ceFEPIme (MAXIPIME) 2 g in sodium chloride 0.9 % 100 mL IVPB  Status:  Discontinued     2 g 200 mL/hr over 30 Minutes Intravenous Every 12 hours 10/20/18 2118 10/22/18 0505   10/20/18 2030  vancomycin (VANCOCIN) IVPB 1000 mg/200 mL premix     1,000 mg 200 mL/hr over 60 Minutes Intravenous  Once 10/20/18 2020 10/21/18 0055   10/20/18 1915  ceFEPIme (MAXIPIME) 1 g in sodium chloride 0.9 % 100 mL IVPB     1 g 200 mL/hr over 30 Minutes Intravenous  Once 10/20/18 1906 10/20/18 1958   10/20/18 1915  vancomycin (VANCOCIN) IVPB 1000 mg/200 mL premix     1,000 mg 200 mL/hr over 60 Minutes Intravenous  Once 10/20/18 1906 10/20/18 2025      REVIEW OF SYSTEMS:  Const: negative fever, negative chills, negative weight loss Eyes: negative diplopia or visual changes, negative eye pain ENT: negative coryza, negative sore throat Resp: cough, no hemoptysis, +++dyspnea Cards: negative for chest pain, palpitations, lower extremity edema GU: negative  for frequency, dysuria and hematuria GI: Negative for abdominal pain, diarrhea, bleeding, constipation Skin: negative for rash and pruritus Heme: negative for easy bruising and gum/nose bleeding MS: negative for myalgias, arthralgias, back pain and muscle weakness Neurolo:negative for headaches, dizziness, vertigo, memory problems  Psych: negative for feelings of anxiety, depression  Endocrine no polyuria  Allergy/Immunology-as above Objective:  VITALS:  BP (!) 138/106 (BP Location: Left Arm)   Pulse 62   Temp (!) 97.5 F (36.4 C) (Oral)   Resp 19   Ht 6\' 2"  (1.88 m)   Wt 79.8 kg   SpO2 91%   BMI 22.58 kg/m  PHYSICAL EXAM:  General: Alert, cooperative, no distress, appears stated age.  Head: Normocephalic, without obvious abnormality, atraumatic. Eyes: Conjunctivae clear, anicteric sclerae. Pupils are equal ENT Nares normal. No drainage or sinus tenderness. Lips, mucosa, and tongue normal. No Thrush Neck: Supple, symmetrical, no adenopathy, thyroid: non tender no carotid bruit and no JVD. Back: No CVA tenderness. Lungs: b/l leathery crepts Heart: irregular  Abdomen: Soft, non-tender,not distended. Bowel sounds normal. No masses Extremities: atraumatic, no cyanosis. No edema. No clubbing Skin: No rashes or lesions. Or bruising Lymph: Cervical, supraclavicular normal. Neurologic: Grossly non-focal Pertinent Labs Lab Results Procal <  0.1 CBC    Component Value Date/Time   WBC 7.7 10/23/2018 0326   RBC 4.15 (L) 10/23/2018 0326   HGB 12.4 (L) 10/23/2018 0326   HGB 14.8 07/05/2018 1025   HCT 37.6 (L) 10/23/2018 0326   HCT 44.0 07/05/2018 1025   PLT 192 10/23/2018 0326   PLT 171 07/05/2018 1025   MCV 90.6 10/23/2018 0326   MCV 92 07/05/2018 1025   MCV 95 09/04/2011 0503   MCH 29.9 10/23/2018 0326   MCHC 33.0 10/23/2018 0326   RDW 14.1 10/23/2018 0326   RDW 13.0 07/05/2018 1025   RDW 15.6 (H) 09/04/2011 0503   LYMPHSABS 1.2 10/20/2018 1836   LYMPHSABS 1.3  07/05/2018 1025   LYMPHSABS 1.4 09/04/2011 0503   MONOABS 0.8 10/20/2018 1836   MONOABS 0.4 09/04/2011 0503   EOSABS 0.2 10/20/2018 1836   EOSABS 0.1 07/05/2018 1025   EOSABS 0.1 09/04/2011 0503   BASOSABS 0.0 10/20/2018 1836   BASOSABS 0.0 07/05/2018 1025   BASOSABS 0.0 09/04/2011 0503    CMP Latest Ref Rng & Units 10/23/2018 10/22/2018 10/21/2018  Glucose 70 - 99 mg/dL 205(H) 126(H) 112(H)  BUN 8 - 23 mg/dL 25(H) 21 24(H)  Creatinine 0.61 - 1.24 mg/dL 1.14 1.08 0.96  Sodium 135 - 145 mmol/L 133(L) 135 136  Potassium 3.5 - 5.1 mmol/L 4.3 4.5 4.5  Chloride 98 - 111 mmol/L 102 102 108  CO2 22 - 32 mmol/L 21(L) 23 21(L)  Calcium 8.9 - 10.3 mg/dL 8.3(L) 8.2(L) 8.0(L)  Total Protein 6.5 - 8.1 g/dL - - -  Total Bilirubin 0.3 - 1.2 mg/dL - - -  Alkaline Phos 38 - 126 U/L - - -  AST 15 - 41 U/L - - -  ALT 0 - 44 U/L - - -      Microbiology: Recent Results (from the past 240 hour(s))  Blood culture (routine x 2)     Status: None (Preliminary result)   Collection Time: 10/20/18  6:37 PM  Result Value Ref Range Status   Specimen Description BLOOD LH  Final   Special Requests   Final    BOTTLES DRAWN AEROBIC AND ANAEROBIC Blood Culture adequate volume   Culture   Final    NO GROWTH 3 DAYS Performed at [facility], [address], [place], [place] [address]    Report Status PENDING  Incomplete  Blood culture (routine x 2)     Status: None (Preliminary result)   Collection Time: 10/20/18  6:37 PM  Result Value Ref Range Status   Specimen Description BLOOD RAC  Final   Special Requests   Final    BOTTLES DRAWN AEROBIC AND ANAEROBIC Blood Culture adequate volume   Culture   Final    NO GROWTH 3 DAYS Performed at [facility], [address] [place], [place] [address]    Report Status PENDING  Incomplete  SARS Coronavirus 2 [facility] order, Performed in [place] hospital lab)     Status: None   Collection Time: 10/20/18  6:40 PM  Result Value Ref Range  Status   SARS Coronavirus 2 NEGATIVE NEGATIVE Final    Comment: (NOTE) If result is NEGATIVE SARS-CoV-2 target nucleic acids are NOT DETECTED. The SARS-CoV-2 RNA is generally detectable in upper and lower  respiratory specimens during the acute phase of infection. The lowest  concentration of SARS-CoV-2 viral copies this assay can detect is 250  copies / mL. A negative result does not preclude SARS-CoV-2 infection  and  should not be used as the sole basis for treatment or other  patient management decisions.  A negative result may occur with  improper specimen collection / handling, submission of specimen other  than nasopharyngeal swab, presence of viral mutation(s) within the  areas targeted by this assay, and inadequate number of viral copies  (<250 copies / mL). A negative result must be combined with clinical  observations, patient history, and epidemiological information. If result is POSITIVE SARS-CoV-2 target nucleic acids are DETECTED. The SARS-CoV-2 RNA is generally detectable in upper and lower  respiratory specimens dur ing the acute phase of infection.  Positive  results are indicative of active infection with SARS-CoV-2.  Clinical  correlation with patient history and other diagnostic information is  necessary to determine patient infection status.  Positive results do  not rule out bacterial infection or co-infection with other viruses. If result is PRESUMPTIVE POSTIVE SARS-CoV-2 nucleic acids MAY BE PRESENT.   A presumptive positive result was obtained on the submitted specimen  and confirmed on repeat testing.  While 2019 novel coronavirus  (SARS-CoV-2) nucleic acids may be present in the submitted sample  additional confirmatory testing may be necessary for epidemiological  and / or clinical management purposes  to differentiate between  SARS-CoV-2 and other Sarbecovirus currently known to infect humans.  If clinically indicated additional testing with an alternate  test  methodology [phone]) is advised. The SARS-CoV-2 RNA is generally  detectable in upper and lower respiratory sp ecimens during the acute  phase of infection. The expected result is Negative. Fact Sheet for Patients:  StrictlyIdeas.no Fact Sheet for Healthcare Providers: BankingDealers.co.za This test is not yet approved or cleared by the [place] FDA and has been authorized for detection and/or diagnosis of SARS-CoV-2 by FDA under an Emergency Use Authorization (EUA).  This EUA will remain in effect (meaning this test can be used) for the duration of the COVID-19 declaration under Section 564(b)(1) of the Act, 21 U.S.C. section 360bbb-3(b)(1), unless the authorization is terminated or revoked sooner. Performed at [facility], [address] [place], [place] [address]   MRSA PCR Screening     Status: None   Collection Time: 10/21/18 11:03 AM  Result Value Ref Range Status   MRSA by PCR NEGATIVE NEGATIVE Final    Comment:        The GeneXpert MRSA Assay (FDA approved for NASAL specimens only), is one component of a comprehensive MRSA colonization surveillance program. It is not intended to diagnose MRSA infection nor to guide or monitor treatment for MRSA infections. Performed at [facility], [address] [place], [place] [address]   Culture, sputum-assessment     Status: None   Collection Time: 10/21/18 11:21 AM  Result Value Ref Range Status   Specimen Description EXPECTORATED SPUTUM  Final   Special Requests NONE  Final   Sputum evaluation   Final    Sputum specimen not acceptable for testing.  Please recollect.   Performed at [facility], [address] [place], [place] [address]    Report Status 10/21/2018 FINAL  Final    IMAGING RESULTS:      07/15/18   Centrilobular and paraseptal emphysema. Progressed subpleural reticular opacity in both lungs resembling early  honeycombing in areas.  10/23/18   Personally reviewed the films? diffuse and interstitial densities throughout both lungs concerning for interstitial or atypical pneumonia or possibly edema   Impression/Recommendation ?85 y.o. male with a history of CHF, AFIB on coumadin,  AAA repair in 2009. Presented  with sob and feeling weak Pt apparently had pulse ox of 83% at home. When EMS was called his vitals were okay and EKG had showed Afib  Pt says he has been having some weakness for the past 2-3 weeks intermittently. HE was in [facility]  In jan 2020 admitted for 6 days for Afib and sob and was started on Amiodarone 1/15 received IV amiodarone drip and then discharged on 1/18  400mg  BID for 30 days and then on 200mg  once a daily. He was also treated for pneumonia then Pt was continuing to have SOB and an CXR done on 3/24 did not show any pneumonia. Pt insisted on getting antibiotics but was not given rightfully by the PA-C.  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 Gwyndolyn Kaufman   ___________________________________________________ Discussed with patient,and Dr.Ojie

## 2018-10-23 NOTE — Progress Notes (Addendum)
[facility] [place] NAME: Joseph Barton    MR#:  [id number]  DATE OF BIRTH:  1926-09-13  SUBJECTIVE:  CHIEF COMPLAINT:   Chief Complaint  Patient presents with  . Weakness   Patient reported to have had worsening shortness of breath last night and now on high flow oxygen at 8 L this morning with oxygen saturation of 90 to 94%.  Shortness of breath appears improved this morning.  Still having intermittent cough. No fevers.   REVIEW OF SYSTEMS:  Review of Systems  Constitutional: Negative for chills and fever.  HENT: Negative for hearing loss and tinnitus.   Eyes: Negative for blurred vision and double vision.  Respiratory: Positive for cough and shortness of breath.        Shortness of breath improving  Cardiovascular: Negative for chest pain and palpitations.  Gastrointestinal: Negative for abdominal pain, heartburn, nausea and vomiting.  Genitourinary: Negative for dysuria and urgency.  Musculoskeletal: Negative for myalgias and neck pain.  Skin: Negative for itching and rash.  Neurological: Negative for dizziness and headaches.  Psychiatric/Behavioral: Negative for depression and hallucinations.    DRUG ALLERGIES:   Allergies  Allergen Reactions  . Atorvastatin   . Simvastatin    VITALS:  Blood pressure (!) 138/106, pulse 78, temperature (!) 97.5 F (36.4 C), temperature source Oral, resp. rate 19, height 6\' 2"  (1.88 m), weight 79.8 kg, SpO2 91 %. PHYSICAL EXAMINATION:   Physical Exam  Constitutional: He is oriented to person, place, and time. He appears well-developed and well-nourished.  HENT:  Head: Normocephalic and atraumatic.  Right Ear: External ear normal.  Eyes: Pupils are equal, round, and reactive to light. Conjunctivae are normal. Right eye exhibits no discharge. No scleral icterus.  Neck: Normal range of motion. Neck supple. No tracheal deviation present.  Cardiovascular:  Irregularly irregular   Respiratory: Effort normal. He has rales.  GI: Soft. Bowel sounds are normal. There is no abdominal tenderness.  Musculoskeletal: Normal range of motion.        General: No edema.     Comments: Chronic venous stasis changes to both lower extremity.  Neurological: He is alert and oriented to person, place, and time. No cranial nerve deficit.  Skin: Skin is warm. He is not diaphoretic. No erythema.  Psychiatric: He has a normal mood and affect. His behavior is normal.   LABORATORY PANEL:  Male CBC Recent Labs  Lab 10/23/18 0326  WBC 7.7  HGB 12.4*  HCT 37.6*  PLT 192   ------------------------------------------------------------------------------------------------------------------ Chemistries  Recent Labs  Lab 10/20/18 1840  10/23/18 0326  NA 135   < > 133*  K 4.4   < > 4.3  CL 103   < > 102  CO2 23   < > 21*  GLUCOSE 119*   < > 205*  BUN 27*   < > 25*  CREATININE 1.24   < > 1.14  CALCIUM 7.9*   < > 8.3*  MG  --    < > 2.0  AST 30  --   --   ALT 20  --   --   ALKPHOS 92  --   --   BILITOT 0.4  --   --    < > = values in this interval not displayed.   RADIOLOGY:  Ct Chest Wo Contrast  Result Date: 10/23/2018 CLINICAL DATA:  Cough, shortness of breath. EXAM: CT CHEST WITHOUT CONTRAST TECHNIQUE: Multidetector CT imaging of the  chest was performed following the standard protocol without IV contrast. COMPARISON:  Radiograph of October 22, 2018. CT scan of July 15, 2018. FINDINGS: Cardiovascular: Atherosclerosis of thoracic aorta is noted without aneurysm formation. Normal cardiac size. Coronary artery calcifications are noted. No pericardial effusion. Mediastinum/Nodes: Thyroid gland and esophagus are unremarkable. Mildly enlarged mediastinal adenopathy is noted which most likely is reactive or inflammatory in etiology. Lungs/Pleura: No pneumothorax or pleural effusion is noted. Emphysematous disease is noted in both lungs. Bronchiectasis is noted in both lower lobes. There is  interval development of diffuse reticular and interstitial densities throughout both lungs concerning for interstitial or atypical pneumonia or possibly edema. Upper Abdomen: No acute abnormality. Musculoskeletal: No chest wall mass or suspicious bone lesions identified. IMPRESSION: Interval development of diffuse and interstitial densities throughout both lungs concerning for interstitial or atypical pneumonia or possibly edema. Bronchiectasis is noted in both lower lobes. Mildly enlarged mediastinal adenopathy is noted which most likely is reactive or inflammatory in etiology. Coronary artery calcifications are noted suggesting coronary artery disease. Aortic Atherosclerosis (ICD10-I70.0) and Emphysema (ICD10-J43.9). Electronically Signed   By: Marijo Conception M.D.   On: 10/23/2018 10:15   Dg Chest Port 1 View  Result Date: 10/22/2018 CLINICAL DATA:  Shortness of breath. EXAM: PORTABLE CHEST 1 VIEW COMPARISON:  Chest x-ray dated October 20, 2018. FINDINGS: Stable cardiomediastinal silhouette. Bilateral diffuse interstitial and alveolar airspace opacities with peripheral predominance again noted, slightly worsened in both upper lobes. No pleural effusion or pneumothorax. No acute osseous abnormality. IMPRESSION: 1. Peripheral predominant bilateral interstitial and alveolar airspace disease, worsened in both upper lobes. Electronically Signed   By: Titus Dubin M.D.   On: 10/22/2018 19:41   ASSESSMENT AND PLAN:  85 year old male patient with a known history of pneumonia in the past, chronic systolic heart failure with EF of 25-30%, coronary artery disease, abdominal aortic aneurysm, hyperlipidemia, hypertension, mitral regurgitation, atrial fibrillation on Coumadin presented to emergency room for shortness of breath and cough.   1.Acute respiratory failure with hypoxia Secondary to pneumonia Patient having high oxygen requirement with 8 L of high flow oxygen this morning.  Had more shortness of breath  last night but appears improving this morning.  Chest x-ray from last night revealed peripheral predominant bilateral interstitial and alveolar airspace disease, worsened in both upper lobes Responding CT chest without contrast done for further evaluation revealed interval development of diffuse and interstitial densities throughout both lungs concerning for interstitial or atypical pneumonia or possibly edema. Bronchiectasis is noted in both lower lobes. Mildly enlarged mediastinal adenopathy is noted which most likely is reactive or inflammatory in etiology. Patient had been on broad-spectrum IV antibiotics with vancomycin and cefepime. MRSA by PCR came back negative.  Vancomycin discontinued yesterday.  However due to worsening of respiratory status with oxygen requirement, I have resumed vancomycin for broader coverage and requested for infectious disease consultation to assist with management. I later discussed case with infectious disease specialist Dr. Ramon Dredge who will see patient shortly.  She doubts pneumonia since patient is afebrile and no leukocytosis.  She has discontinued antibiotics. Other differentials include amiodarone induced lung changes since patient is on amiodarone for the last few months. Amiodarone discontinued today.. I have requested for pulmonary consultation as well. Due to patient's history of chronic systolic CHF, I have also started patient on IV Lasix 40 mg daily. Monitor clinically Pulmonary embolism very unlikely since patient already on anticoagulation with INR therapeutic at 2.6.  2.Multilobar pneumonia Healthcare associated pneumonia Patient was initially  placed on broad-spectrum IV antibiotics with IV vancomycin and cefepime.  Sputum MRSA by PCR came back negative.  Vancomycin discontinued yesterday but had to be restarted today due to worsening of respiratory status.  Infectious disease specialist reviewed chart and will see patient shortly.  Doubts  pneumonia.  Antibiotics discontinued. We will follow-up on pulmonary evaluation today Continue oxygen supplementation via high flow So far no growth on blood cultures. Coronavirus test negative.  COVID 19 ruled out Mild elevation in lactic acid level of 2.6 which trended down.  Likely due to dehydration initially.  Patient does not appear septic clinically.  PRN Tessalon for cough  3.Chronic atrial fibrillation INR therapeutic Continue Coumadin for anticoagulation Digoxin and amiodarone was held this morning due to borderline blood pressure.  Patient also on metoprolol.  4. Hypertension Blood pressure controlled on current regimen  Lisinopril discontinued recently due to borderline low blood pressures  5.  Chronic systolic CHF Ejection fraction of 25 to 30%.  Lasix was initially placed on hold on admission due to concern for dehydration. Due to worsening respiratory status, Lasix resumed today. Continue beta-blocker.  Lisinopril on hold due to blood pressure being borderline recently..  DVT prophylaxis; patient already on anticoagulation with Coumadin  I called patient's spouse listed in chart Ms. Santina Evans.  Updated her on patient's clinical condition.  All questions were answered and she is in agreement with the plan of care.  All the records are reviewed and case discussed with Care Management/Social Worker. Management plans discussed with the patient, and they are in agreement.  CODE STATUS: DNR  TOTAL TIME TAKING CARE OF THIS PATIENT: 38 minutes.   More than 50% of the time was spent in counseling/coordination of care: YES  POSSIBLE D/C IN 2 DAYS, DEPENDING ON CLINICAL CONDITION.   Lenis Nettleton M.D on 10/23/2018 at 10:39 AM  Between 7am to 6pm - Pager - [phone]  After 6pm go to [email or web address] - password [email or web address] [facility]  [facility]  Office  [phone]  CC: Primary care physician; Jerrol Banana., MD  Note: This dictation was prepared  with Dragon dictation along with smaller phrase technology. Any transcriptional errors that result from this process are unintentional.

## 2018-10-23 NOTE — Consult Note (Addendum)
Pulmonary Medicine          Date: 10/23/2018,   MRN# [id number] Joseph Barton 1927/02/10     AdmissionWeight: 83 kg                 CurrentWeight: 79.8 kg      CHIEF COMPLAINT:   Acute hypoxemic respiratory failure  HISTORY OF PRESENT ILLNESS   This is a pleasant 85 year old male with a history of systolic CHF with EF 20 to 25%, atrial fibrillation on Coumadin, CAD AAA with repair in 2009, came into the hospital with dyspnea at rest, malaise and was found to have abnormal chest x-ray suggestive of pneumonia.  He was in the hospital 2 months previously with similar symptoms however states that he initially improved to baseline.  He denies having a fever at home has been tested for COVID-19 and is negative.  Patient also apparently had 20 pound weight loss on admission in the ED this was unintentional despite normal appetite.  He required increased O2 therapy in the field on his way to the ED.  While in the ED he was found to be moderately hypotensive and tachypneic however was nonfebrile, his BNP was found to be 394 and was previously at over 600 in January and June of last year during CHF exacerbation.  He had a mild AKI with GFR decreased to 51, and mild protein calorie malnutrition with an albumin of 2.8.  He had testing of his urine for strep pneumonia and Legionella which were negative.  His CBC with differential shows no leukocytosis.  A respiratory viral panel was pending.  Patient is DNR.  PAST MEDICAL HISTORY   Past Medical History:  Diagnosis Date   Abdominal aortic aneurysm ([place]) 01-2004   4.7 x 4.7 cm.   CAD (coronary artery disease) 01/25/2004   a. 01/2004 Ant MI/DES to LAD;  b. 10/2011 Neg MV.   CHF (congestive heart failure) ([facility])    Chronic systolic heart failure ([place])    a. 08/2016 Echo: EF 35%, nl RV fxn, mod to sev MR, mild to mod TR, mod biatrial enlargement.   Hyperlipidemia    Hypertension    Ischemic cardiomyopathy    a. 08/2016 Echo: EF 35%.    Moderate to Severe Mitral regurgitation    a. 08/2016 Echo: Mod-sev MR.   Permanent atrial fibrillation    a. Chronic coumadin (CHA2DS2VASc = 5).   Pulmonary nodules    Noted on abdominal CT   Statin intolerance      SURGICAL HISTORY   Past Surgical History:  Procedure Laterality Date   ABDOMINAL AORTIC ANEURYSM REPAIR     12/02/2007 [facility]   ABDOMINAL AORTIC ANEURYSM REPAIR  2006   [facility]    CARDIAC CATHETERIZATION  2009   [facility]   CORONARY ANGIOPLASTY WITH STENT PLACEMENT  2005   Cypher stent LAD    Cypher stents to LAD     01/25/2004   SKIN SURGERY  2016   [facility] skin cancer     FAMILY HISTORY   Family History  Problem Relation Age of Onset   Heart attack Father 35       Died w/ Heart attack   Diabetes Father    Heart attack Brother    Heart disease Brother    Diabetes Sister    Heart disease Sister      SOCIAL HISTORY   Social History   Tobacco Use   Smoking status: Former Smoker  Last attempt to quit: 12/01/1987    Years since quitting: 30.9   Smokeless tobacco: Never Used  Substance Use Topics   Alcohol use: No   Drug use: No     MEDICATIONS    Home Medication:    Current Medication:  Current Facility-Administered Medications:    0.9 %  sodium chloride infusion, 250 mL, Intravenous, PRN, Saundra Shelling, MD, Stopped at 10/22/18 1845   acetaminophen (TYLENOL) tablet 650 mg, 650 mg, Oral, Q6H PRN, Stark Jock, Jude, MD, 650 mg at 10/21/18 1757   albuterol (PROVENTIL) (2.5 MG/3ML) 0.083% nebulizer solution 2.5 mg, 2.5 mg, Inhalation, Q6H PRN, Pyreddy, Reatha Harps, MD, 2.5 mg at 10/22/18 0240   amiodarone (PACERONE) tablet 200 mg, 200 mg, Oral, Daily, Pyreddy, Pavan, MD, 200 mg at 10/23/18 1046   benzonatate (TESSALON) capsule 100 mg, 100 mg, Oral, TID PRN, Stark Jock, Jude, MD, 100 mg at 10/22/18 4854   digoxin (LANOXIN) tablet 0.0625 mg, 0.0625 mg, Oral, Daily, Pyreddy, Pavan, MD, 0.0625 mg at 10/23/18 1045   docusate sodium (COLACE)  capsule 200 mg, 200 mg, Oral, BID, Harrie Foreman, MD, 200 mg at 10/23/18 1045   furosemide (LASIX) injection 40 mg, 40 mg, Intravenous, Q12H, Ojie, Jude, MD   guaiFENesin-dextromethorphan (ROBITUSSIN DM) 100-10 MG/5ML syrup 5 mL, 5 mL, Oral, Q4H PRN, Ojie, Jude, MD, 5 mL at 10/22/18 1609   ipratropium-albuterol (DUONEB) 0.5-2.5 (3) MG/3ML nebulizer solution 3 mL, 3 mL, Nebulization, Q4H, Epifanio Lesches, MD, 3 mL at 10/23/18 1130   metoprolol succinate (TOPROL-XL) 24 hr tablet 12.5 mg, 12.5 mg, Oral, Daily, Pyreddy, Pavan, MD, 12.5 mg at 10/23/18 1046   multivitamin with minerals tablet 1 tablet, 1 tablet, Oral, Daily, Ojie, Jude, MD, 1 tablet at 10/23/18 1046   pantoprazole (PROTONIX) EC tablet 40 mg, 40 mg, Oral, Daily, Pyreddy, Pavan, MD, 40 mg at 10/23/18 1045   polyethylene glycol (MIRALAX / GLYCOLAX) packet 17 g, 17 g, Oral, Daily, Harrie Foreman, MD, 17 g at 10/23/18 1046   sodium chloride flush (NS) 0.9 % injection 3 mL, 3 mL, Intravenous, Q12H, Pyreddy, Pavan, MD, 3 mL at 10/23/18 1050   sodium chloride flush (NS) 0.9 % injection 3 mL, 3 mL, Intravenous, PRN, Pyreddy, Pavan, MD   tamsulosin (FLOMAX) capsule 0.4 mg, 0.4 mg, Oral, Daily, Pyreddy, Pavan, MD, 0.4 mg at 10/23/18 1046   warfarin (COUMADIN) tablet 1.25 mg, 1.25 mg, Oral, Q M,W,F-1800, Pyreddy, Pavan, MD, 1.25 mg at 10/21/18 1741   warfarin (COUMADIN) tablet 2.5 mg, 2.5 mg, Oral, Q T,Th,S,Su-1800, Pyreddy, Pavan, MD, 2.5 mg at 10/22/18 1758   Warfarin - Physician Dosing Inpatient, , Does not apply, q1800, Pyreddy, Pavan, MD    ALLERGIES   Atorvastatin and Simvastatin     REVIEW OF SYSTEMS    Review of Systems:  Gen:  Denies  fever, sweats, chills weigh loss  HEENT: Denies blurred vision, double vision, ear pain, eye pain, hearing loss, nose bleeds, sore throat Cardiac:  No dizziness, chest pain or heaviness, chest tightness,edema Resp:   Denies cough or sputum porduction, shortness of  breath,wheezing, hemoptysis,  Gi: Denies swallowing difficulty, stomach pain, nausea or vomiting, diarrhea, constipation, bowel incontinence Gu:  Denies bladder incontinence, burning urine Ext:   Denies Joint pain, stiffness or swelling Skin: Denies  skin rash, easy bruising or bleeding or hives Endoc:  Denies polyuria, polydipsia , polyphagia or weight change Psych:   Denies depression, insomnia or hallucinations   Other:  All other systems negative   VS: BP (!) 138/106 (  BP Location: Left Arm)    Pulse 62    Temp (!) 97.5 F (36.4 C) (Oral)    Resp 19    Ht 6\' 2"  (1.88 m)    Wt 79.8 kg    SpO2 91%    BMI 22.58 kg/m      PHYSICAL EXAM    GENERAL:NAD, no fevers, chills, no weakness no fatigue HEAD: Normocephalic, atraumatic.  EYES: Pupils equal, round, reactive to light. Extraocular muscles intact. No scleral icterus.  MOUTH: Moist mucosal membrane. Dentition intact. No abscess noted.  EAR, NOSE, THROAT: Clear without exudates. No external lesions.  NECK: Supple. No thyromegaly. No nodules. No JVD.  PULMONARY: Bibasilar velcro crepitations without rhonchorous breath sounds or wheezing bilaterally CARDIOVASCULAR: S1 and S2. Regular rate and rhythm. No murmurs, rubs, or gallops. No edema. Pedal pulses 2+ bilaterally.  GASTROINTESTINAL: Soft, nontender, nondistended. No masses. Positive bowel sounds. No hepatosplenomegaly.  MUSCULOSKELETAL: No swelling, clubbing, or edema. Range of motion full in all extremities.  NEUROLOGIC: Cranial nerves II through XII are intact. No gross focal neurological deficits. Sensation intact. Reflexes intact.  SKIN: No ulceration, lesions, rashes, or cyanosis. Skin warm and dry. Turgor intact.  PSYCHIATRIC: Mood, affect within normal limits. The patient is awake, alert and oriented x 3. Insight, judgment intact.       IMAGING    Dg Chest 2 View  Result Date: 09/24/2018 CLINICAL DATA:  Follow-up right upper lobe pneumonia EXAM: CHEST - 2 VIEW  COMPARISON:  07/15/2018 FINDINGS: Cardiac shadow is enlarged. Aortic calcifications are noted. Chronic interstitial changes are again seen bilaterally. The previously seen density in the right upper lobe is less prominent than that seen on the prior exam but still present. No new focal infiltrate is seen. No sizable effusion is noted. No acute bony abnormality is seen. IMPRESSION: Persistent but improving density in the right upper lobe. This may represent some residual scarring. Electronically Signed   By: Inez Catalina M.D.   On: 09/24/2018 10:54   Ct Chest Wo Contrast  Result Date: 10/23/2018 CLINICAL DATA:  Cough, shortness of breath. EXAM: CT CHEST WITHOUT CONTRAST TECHNIQUE: Multidetector CT imaging of the chest was performed following the standard protocol without IV contrast. COMPARISON:  Radiograph of October 22, 2018. CT scan of July 15, 2018. FINDINGS: Cardiovascular: Atherosclerosis of thoracic aorta is noted without aneurysm formation. Normal cardiac size. Coronary artery calcifications are noted. No pericardial effusion. Mediastinum/Nodes: Thyroid gland and esophagus are unremarkable. Mildly enlarged mediastinal adenopathy is noted which most likely is reactive or inflammatory in etiology. Lungs/Pleura: No pneumothorax or pleural effusion is noted. Emphysematous disease is noted in both lungs. Bronchiectasis is noted in both lower lobes. There is interval development of diffuse reticular and interstitial densities throughout both lungs concerning for interstitial or atypical pneumonia or possibly edema. Upper Abdomen: No acute abnormality. Musculoskeletal: No chest wall mass or suspicious bone lesions identified. IMPRESSION: Interval development of diffuse and interstitial densities throughout both lungs concerning for interstitial or atypical pneumonia or possibly edema. Bronchiectasis is noted in both lower lobes. Mildly enlarged mediastinal adenopathy is noted which most likely is reactive or  inflammatory in etiology. Coronary artery calcifications are noted suggesting coronary artery disease. Aortic Atherosclerosis (ICD10-I70.0) and Emphysema (ICD10-J43.9). Electronically Signed   By: Marijo Conception M.D.   On: 10/23/2018 10:15   Dg Chest Port 1 View  Result Date: 10/22/2018 CLINICAL DATA:  Shortness of breath. EXAM: PORTABLE CHEST 1 VIEW COMPARISON:  Chest x-ray dated October 20, 2018. FINDINGS: Stable  cardiomediastinal silhouette. Bilateral diffuse interstitial and alveolar airspace opacities with peripheral predominance again noted, slightly worsened in both upper lobes. No pleural effusion or pneumothorax. No acute osseous abnormality. IMPRESSION: 1. Peripheral predominant bilateral interstitial and alveolar airspace disease, worsened in both upper lobes. Electronically Signed   By: Titus Dubin M.D.   On: 10/22/2018 19:41   Dg Chest Portable 1 View  Result Date: 10/20/2018 CLINICAL DATA:  Shortness of breath, hypoxia EXAM: PORTABLE CHEST 1 VIEW COMPARISON:  09/24/2018 FINDINGS: Bilateral diffuse interstitial and alveolar airspace opacities with a peripheral predominance. No pleural effusion or pneumothorax. Stable cardiomediastinal silhouette. No aggressive osseous lesion. IMPRESSION: 1. Chronic interstitial lung disease. 2. Increased bilateral interstitial and alveolar airspace opacities with a peripheral predominance. Differential considerations include multilobar pneumonia including atypical infection such as viral pneumonia. 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 Delaine Lame          -will start prednisone 40 mg p.o. daily plan for 2 months slow taper          -need to stop amiodarone and initiate alternative  antiarrhythmic          - home with O2 at least for short term           - pulmonary clinic follow up will set up post d/c                          Thank you for allowing me to participate in the care of this patient.   Patient/Family are satisfied with care plan and all questions have been answered.  This document was prepared using Dragon voice recognition software and may include unintentional dictation errors.     Ottie Glazier, M.D.  Division of [place]

## 2018-10-24 LAB — PROTIME-INR
INR: 3.3 — ABNORMAL HIGH (ref 0.8–1.2)
Prothrombin Time: 32.7 seconds — ABNORMAL HIGH (ref 11.4–15.2)

## 2018-10-24 LAB — CBC
HCT: 36.6 % — ABNORMAL LOW (ref 39.0–52.0)
Hemoglobin: 12.1 g/dL — ABNORMAL LOW (ref 13.0–17.0)
MCH: 29.8 pg (ref 26.0–34.0)
MCHC: 33.1 g/dL (ref 30.0–36.0)
MCV: 90.1 fL (ref 80.0–100.0)
Platelets: 206 10*3/uL (ref 150–400)
RBC: 4.06 MIL/uL — ABNORMAL LOW (ref 4.22–5.81)
RDW: 14.1 % (ref 11.5–15.5)
WBC: 14.7 10*3/uL — ABNORMAL HIGH (ref 4.0–10.5)
nRBC: 0 % (ref 0.0–0.2)

## 2018-10-24 LAB — BASIC METABOLIC PANEL
Anion gap: 10 (ref 5–15)
BUN: 33 mg/dL — ABNORMAL HIGH (ref 8–23)
CO2: 23 mmol/L (ref 22–32)
Calcium: 8.5 mg/dL — ABNORMAL LOW (ref 8.9–10.3)
Chloride: 102 mmol/L (ref 98–111)
Creatinine, Ser: 1.01 mg/dL (ref 0.61–1.24)
GFR calc Af Amer: >60 mL/min (ref >60)
GFR calc non Af Amer: >60 mL/min (ref >60)
Glucose, Bld: 166 mg/dL — ABNORMAL HIGH (ref 70–99)
Potassium: 4.4 mmol/L (ref 3.5–5.1)
Sodium: 135 mmol/L (ref 135–145)

## 2018-10-24 LAB — MAGNESIUM: Magnesium: 2 mg/dL (ref 1.7–2.4)

## 2018-10-24 LAB — TROPONIN I: Troponin I: <0.03 ng/mL (ref <0.03)

## 2018-10-24 MED ORDER — IPRATROPIUM-ALBUTEROL 0.5-2.5 (3) MG/3ML IN SOLN
3.0000 mL | Freq: Three times a day (TID) | RESPIRATORY_TRACT | Status: DC
  Administered 2018-10-24 – 2018-11-01 (×24): 3 mL via RESPIRATORY_TRACT
  Filled 2018-10-24 (×22): qty 3

## 2018-10-24 MED ORDER — FUROSEMIDE 10 MG/ML IJ SOLN
40.0000 mg | Freq: Every day | INTRAMUSCULAR | Status: DC
  Administered 2018-10-25: 40 mg via INTRAVENOUS
  Filled 2018-10-24 (×2): qty 4

## 2018-10-24 MED ORDER — NITROGLYCERIN 0.4 MG SL SUBL
SUBLINGUAL_TABLET | SUBLINGUAL | Status: AC
  Filled 2018-10-24: qty 1

## 2018-10-24 MED ORDER — WARFARIN - PHARMACIST DOSING INPATIENT
Freq: Every day | Status: DC
  Administered 2018-10-27: 17:00:00

## 2018-10-24 MED ORDER — SODIUM CHLORIDE 0.9 % IV SOLN: INTRAVENOUS | Status: DC

## 2018-10-24 MED ORDER — NITROGLYCERIN 0.4 MG SL SUBL: 0.4000 mg | SUBLINGUAL_TABLET | SUBLINGUAL | Status: DC | PRN

## 2018-10-24 MED ORDER — TRAZODONE HCL 50 MG PO TABS
50.0000 mg | ORAL_TABLET | Freq: Every evening | ORAL | Status: DC | PRN
  Administered 2018-10-24 – 2018-10-30 (×4): 50 mg via ORAL
  Filled 2018-10-24 (×4): qty 1

## 2018-10-24 NOTE — NC FL2 (Signed)
[place] LEVEL OF CARE SCREENING TOOL     IDENTIFICATION  Patient Name: Joseph Barton Birthdate: [birth year] Sex: male Admission Date (Current Location): 10/20/2018  [place] and [place] Number:  Engineering geologist and Address:  [facility], [address] [place], [place] [address]      Provider Number: [id number]  Attending Physician Name and Address:  Otila Back, MD  Relative Name and Phone Number:  Cliffton Asters Daughter   Cash [phone]  [phone]     Current Level of Care: Hospital Recommended Level of Care: [place] Prior Approval Number:    Date Approved/Denied:   PASRR Number: [id number]  Discharge Plan: SNF    Current Diagnoses: Patient Active Problem List   Diagnosis Date Noted  . HCAP (healthcare-associated pneumonia) 10/20/2018  . Malnutrition of moderate degree 07/18/2018  . Pneumonia 07/15/2018  . Hypotension 12/12/2017  . CHF (congestive heart failure) ([place]) Nov 28, 202019  . Subclinical hypothyroidism 10/06/2015  . Allergic rhinitis 09/10/2015  . Bradycardia 09/10/2015  . Failure of erection 09/10/2015  . Blood glucose elevated 09/10/2015  . BP (high blood pressure) 09/10/2015  . Neuropathy 09/10/2015  . Apnea, sleep 09/10/2015  . Cancer of skin, squamous cell 09/10/2015  . Acid reflux 04/28/2015  . Arthritis, degenerative 12/25/2014  . Cardiomyopathy, ischemic 09/27/2013  . HLD (hyperlipidemia) 09/27/2013  . Lung nodule, multiple 09/27/2013  . Drug intolerance 09/27/2013  . Ischemic cardiomyopathy 12/01/2011  . Atrial fibrillation ([place]) 12/01/2011  . Chronic systolic heart failure ([place]) 12/01/2011  . IVCD (intraventricular conduction defect) 12/01/2011  . Intraventricular block 12/01/2011  . Mechanical complication of aortic graft ([place]) 12/02/2007  . CAD S/P percutaneous coronary angioplasty 01/25/2004  . Myocardial infarction ([place]) 01/25/2004  . AAA  (abdominal aortic aneurysm) ([place]) 01/01/2004    Orientation RESPIRATION BLADDER Height & Weight     Self, Time, Situation, Place  O2(5L) Continent Weight: 173 lb 11.2 oz (78.8 kg) Height:  6\' 2"  (188 cm)  BEHAVIORAL SYMPTOMS/MOOD NEUROLOGICAL BOWEL NUTRITION STATUS      Continent Diet(Cardiac)  AMBULATORY STATUS COMMUNICATION OF NEEDS Skin   Limited Assist Verbally Normal                       Personal Care Assistance Level of Assistance  Bathing, Feeding, Dressing Bathing Assistance: Limited assistance Feeding assistance: Independent Dressing Assistance: Limited assistance     Functional Limitations Info  Sight, Hearing, Speech Sight Info: Adequate Hearing Info: Adequate Speech Info: Adequate    SPECIAL CARE FACTORS FREQUENCY  PT (By licensed PT), OT (By licensed OT)     PT Frequency: Minimum 5x a week OT Frequency: Minimum 5x a week            Contractures Contractures Info: Not present    Additional Factors Info  Code Status, Allergies Code Status Info: DNR Allergies Info: ATORVASTATIN, SIMVASTATIN            Current Medications (10/24/2018):  This is the current hospital active medication list Current Facility-Administered Medications  Medication Dose Route Frequency Provider Last Rate Last Dose  . nitroGLYCERIN (NITROSTAT) 0.4 MG SL tablet           . 0.9 %  sodium chloride infusion  250 mL Intravenous PRN Saundra Shelling, MD   Stopped at 10/22/18 1845  . acetaminophen (TYLENOL) tablet 650 mg  650 mg Oral Q6H PRN Stark Jock, Jude, MD   650 mg at 10/24/18 1326  . albuterol (PROVENTIL) (  2.5 MG/3ML) 0.083% nebulizer solution 2.5 mg  2.5 mg Inhalation Q6H PRN Saundra Shelling, MD   2.5 mg at 10/22/18 0240  . benzonatate (TESSALON) capsule 100 mg  100 mg Oral TID PRN Stark Jock, Jude, MD   100 mg at 10/22/18 0833  . digoxin (LANOXIN) tablet 0.0625 mg  0.0625 mg Oral Daily Pyreddy, Reatha Harps, MD   0.0625 mg at 10/24/18 0925  . docusate sodium (COLACE) capsule 200 mg  200 mg  Oral BID Harrie Foreman, MD   200 mg at 10/24/18 4503  . [START ON 10/25/2018] furosemide (LASIX) injection 40 mg  40 mg Intravenous Daily Ojie, Jude, MD      . guaiFENesin-dextromethorphan (ROBITUSSIN DM) 100-10 MG/5ML syrup 5 mL  5 mL Oral Q4H PRN Ojie, Jude, MD   5 mL at 10/22/18 1609  . ipratropium-albuterol (DUONEB) 0.5-2.5 (3) MG/3ML nebulizer solution 3 mL  3 mL Nebulization TID Stark Jock, Jude, MD   3 mL at 10/24/18 1352  . metoprolol succinate (TOPROL-XL) 24 hr tablet 12.5 mg  12.5 mg Oral Daily Pyreddy, Reatha Harps, MD   12.5 mg at 10/24/18 0924  . multivitamin with minerals tablet 1 tablet  1 tablet Oral Daily Stark Jock, Jude, MD   1 tablet at 10/24/18 0925  . pantoprazole (PROTONIX) EC tablet 40 mg  40 mg Oral Daily Pyreddy, Reatha Harps, MD   40 mg at 10/24/18 0925  . polyethylene glycol (MIRALAX / GLYCOLAX) packet 17 g  17 g Oral Daily Harrie Foreman, MD   17 g at 10/24/18 0925  . predniSONE (DELTASONE) tablet 40 mg  40 mg Oral Q breakfast Ottie Glazier, MD   40 mg at 10/24/18 0924  . sodium chloride flush (NS) 0.9 % injection 3 mL  3 mL Intravenous Q12H Pyreddy, Pavan, MD   3 mL at 10/24/18 0930  . sodium chloride flush (NS) 0.9 % injection 3 mL  3 mL Intravenous PRN Saundra Shelling, MD   3 mL at 10/24/18 0641  . tamsulosin (FLOMAX) capsule 0.4 mg  0.4 mg Oral Daily Pyreddy, Reatha Harps, MD   0.4 mg at 10/24/18 0923  . Warfarin - Pharmacist Dosing Inpatient   Does not apply q1800 Stark Jock Jude, MD         Discharge Medications: Please see discharge summary for a list of discharge medications.  Relevant Imaging Results:  Relevant Lab Results:   Additional Information SSN [id number]  Ross Ludwig, LCSW

## 2018-10-24 NOTE — Consult Note (Signed)
[place] for Warfarin Indication: atrial fibrillation  Allergies  Allergen Reactions  . Atorvastatin   . Simvastatin     Patient Measurements: Height: 6\' 2"  (188 cm) Weight: 173 lb 11.2 oz (78.8 kg) IBW/kg (Calculated) : 82.2  Vital Signs: Temp: 97.7 F (36.5 C) (04/23 0756) Temp Source: Oral (04/23 0756) BP: 114/76 (04/23 0756) Pulse Rate: 60 (04/23 0836)  Labs: Recent Labs    10/22/18 0300 10/23/18 0326 10/24/18 0331  HGB  --  12.4* 12.1*  HCT  --  37.6* 36.6*  PLT  --  192 206  LABPROT 26.8* 27.8* 32.7*  INR 2.5* 2.6* 3.3*  CREATININE 1.08 1.14 1.01  TROPONINI <0.03  --   --     Estimated Creatinine Clearance: 54.2 mL/min (by C-G formula based on SCr of 1.01 mg/dL).   Medical History: Past Medical History:  Diagnosis Date  . Abdominal aortic aneurysm ([place]) 01-2004   4.7 x 4.7 cm.  Marland Kitchen CAD (coronary artery disease) 01/25/2004   a. 01/2004 Ant MI/DES to LAD;  b. 10/2011 Neg MV.  . CHF (congestive heart failure) ([place])   . Chronic systolic heart failure ([place])    a. 08/2016 Echo: EF 35%, nl RV fxn, mod to sev MR, mild to mod TR, mod biatrial enlargement.  . Hyperlipidemia   . Hypertension   . Ischemic cardiomyopathy    a. 08/2016 Echo: EF 35%.  . Moderate to Severe Mitral regurgitation    a. 08/2016 Echo: Mod-sev MR.  Marland Kitchen Permanent atrial fibrillation    a. Chronic coumadin (CHA2DS2VASc = 5).  . Pulmonary nodules    Noted on abdominal CT  . Statin intolerance     Medications:  Medications Prior to Admission  Medication Sig Dispense Refill Last Dose  . albuterol (PROVENTIL HFA;VENTOLIN HFA) 108 (90 Base) MCG/ACT inhaler Inhale 2 puffs into the lungs every 6 (six) hours as needed for wheezing or shortness of breath. 1 Inhaler 0 10/20/2018 at Unknown time  . amiodarone (PACERONE) 200 MG tablet Take 1 tablet (200 mg) by mouth once daily 90 tablet 1 10/20/2018 at 0900  . digoxin (LANOXIN) 0.125 MG tablet Take 0.0625 mg by mouth  daily.    10/20/2018 at 0900  . furosemide (LASIX) 20 MG tablet Take 40 mg by mouth daily.   10/20/2018 at 0900  . lisinopril (PRINIVIL,ZESTRIL) 2.5 MG tablet TAKE 1 TABLET BY MOUTH  DAILY 90 tablet 3 10/20/2018 at 0900  . metoprolol succinate (TOPROL-XL) 25 MG 24 hr tablet TAKE ONE-HALF TABLET BY  MOUTH DAILY 45 tablet 3 10/20/2018 at 0900  . omeprazole (PRILOSEC) 20 MG capsule TAKE 1 CAPSULE BY MOUTH  DAILY 90 capsule 3 10/20/2018 at 0900  . tamsulosin (FLOMAX) 0.4 MG CAPS capsule Take 1 capsule (0.4 mg total) by mouth daily. 30 capsule 3 10/19/2018 at 1800  . warfarin (COUMADIN) 2.5 MG tablet Take 1 tablet (2.5 mg total) by mouth as directed. (Patient taking differently: Take  tablet (1.25MG ) by mouth Monday, Wednesday, Friday and take 1 tablet (2.5MG ) by mouth Tuesday, Thursday, Saturday and Sunday) 30 tablet 12 10/19/2018 at 1800  . Bioflavonoid Products (BIOFLEX PO) Take 1 tablet by mouth daily.    Taking  . polyethylene glycol powder (GLYCOLAX) powder Take 1 scoop by mouth daily. 255 g 0 prn at prn  . potassium chloride 20 MEQ TBCR Take 20 mEq by mouth daily. 60 tablet 1 Taking   Scheduled:  . digoxin  0.0625 mg Oral Daily  .  docusate sodium  200 mg Oral BID  . furosemide  40 mg Intravenous Q12H  . ipratropium-albuterol  3 mL Nebulization Q4H  . metoprolol succinate  12.5 mg Oral Daily  . multivitamin with minerals  1 tablet Oral Daily  . pantoprazole  40 mg Oral Daily  . polyethylene glycol  17 g Oral Daily  . predniSONE  40 mg Oral Q breakfast  . sodium chloride flush  3 mL Intravenous Q12H  . tamsulosin  0.4 mg Oral Daily  . Warfarin - Pharmacist Dosing Inpatient   Does not apply q1800   Infusions:  . sodium chloride Stopped (10/22/18 1845)   PRN: sodium chloride, acetaminophen, albuterol, benzonatate, guaiFENesin-dextromethorphan, sodium chloride flush Anti-infectives (From admission, onward)   Start     Dose/Rate Route Frequency Ordered Stop   10/23/18 1000  vancomycin (VANCOCIN)  1,250 mg in sodium chloride 0.9 % 250 mL IVPB  Status:  Discontinued     1,250 mg 166.7 mL/hr over 90 Minutes Intravenous Every 24 hours 10/23/18 0943 10/23/18 1229   10/22/18 1800  ceFEPIme (MAXIPIME) 2 g in sodium chloride 0.9 % 100 mL IVPB  Status:  Discontinued     2 g 200 mL/hr over 30 Minutes Intravenous Every 12 hours 10/22/18 0505 10/23/18 1243   10/22/18 0700  vancomycin (VANCOCIN) 1,750 mg in sodium chloride 0.9 % 500 mL IVPB  Status:  Discontinued     1,750 mg 250 mL/hr over 120 Minutes Intravenous Every 36 hours 10/20/18 2030 10/22/18 0912   10/21/18 0700  ceFEPIme (MAXIPIME) 2 g in sodium chloride 0.9 % 100 mL IVPB  Status:  Discontinued     2 g 200 mL/hr over 30 Minutes Intravenous Every 12 hours 10/20/18 2118 10/22/18 0505   10/20/18 2030  vancomycin (VANCOCIN) IVPB 1000 mg/200 mL premix     1,000 mg 200 mL/hr over 60 Minutes Intravenous  Once 10/20/18 2020 10/21/18 0055   10/20/18 1915  ceFEPIme (MAXIPIME) 1 g in sodium chloride 0.9 % 100 mL IVPB     1 g 200 mL/hr over 30 Minutes Intravenous  Once 10/20/18 1906 10/20/18 1958   10/20/18 1915  vancomycin (VANCOCIN) IVPB 1000 mg/200 mL premix     1,000 mg 200 mL/hr over 60 Minutes Intravenous  Once 10/20/18 1906 10/20/18 2025      Assessment: Pharmacy was consulted to start dosing warfarin. INR increased from 2.6 to 3.3 on 4/23. Patient's home dose is 1.25 mg M-W-F and 2.5 mg T-Th-Sat-Sun. DDI: APAP- inc risk of bleeding, digoxin   Goal of Therapy:  INR 2-3 Monitor platelets by anticoagulation protocol: Yes   Plan:  INR is supratherapeutic. Will hold warfarin dose tonight. Daily INR ordered. CBC stable. Monitor for any sign or symptoms of bleeding. Cefepime was d/c'ed 4/22.   Oswald Hillock, PharmD, BCPS  Clinical Pharmacist  10/24/2018,8:46 AM

## 2018-10-24 NOTE — Progress Notes (Signed)
Nutrition Follow Up Note   DOCUMENTATION CODES:   Not applicable  INTERVENTION:   Magic cup TID with meals, each supplement provides 290 kcal and 9 grams of protein  MVI daily   NUTRITION DIAGNOSIS:   Increased nutrient needs related to acute illness as evidenced by increased estimated needs.  GOAL:   Patient will meet greater than or equal to 90% of their needs  -progressing   MONITOR:   PO intake, Supplement acceptance, Labs, Weight trends, I & O's  ASSESSMENT:   85 y.o. male with a known history of pneumonia in the past, chronic systolic heart failure with EF of 35%, coronary artery disease, abdominal aortic aneurysm, hyperlipidemia, hypertension, mitral regurgitation, atrial fibrillation on Coumadin admitted with HCAP  RD working remotely.  Pt continues to do well; pt eating 100% of meals and eating Magic Cups. Per chart, pt is weight stable since admit.   Medications reviewed and include: colace, lasix, MVI, protonix, miralax, prednisone, warfarin  Labs reviewed: K 4.4 wnl, Mg 2.0 wnl Wbc- 14.7(H)  Diet Order:   Diet Order            Diet regular Room service appropriate? Yes; Fluid consistency: Thin  Diet effective now             EDUCATION NEEDS:   Not appropriate for education at this time  Skin:  Skin Assessment: Reviewed RN Assessment(ecchymosis )  Last BM:  4/19  Height:   Ht Readings from Last 1 Encounters:  10/20/18 6\' 2"  (1.88 m)    Weight:   Wt Readings from Last 1 Encounters:  10/24/18 78.8 kg    Ideal Body Weight:  86.3 kg  BMI:  Body mass index is 22.3 kg/m.  Estimated Nutritional Needs:   Kcal:  2000-2300kcal/day   Protein:  100-115g/day  Fluid:  2L/day   Koleen Distance MS, RD, LDN Pager #- [phone] Office#- [phone] After Hours Pager: [phone]

## 2018-10-24 NOTE — Progress Notes (Signed)
    Case reviewed with Dr. Rockey Situ. Telemetry reviewed in Epic demonstrating known permanent Afib with underlying known IVCD. No inpatient indication for alternative antiarrhythmic therapy at this time. He should follow up with his EP physician upon discharge for consideration if further therapy is needed.

## 2018-10-24 NOTE — TOC Initial Note (Addendum)
Transition of Care [facility]) - Initial/Assessment Note    Patient Details  Name: Joseph Barton MRN: [id number] Date of Birth: Dec 13, 1926  Transition of Care [facility]) CM/SW Contact:    Ross Ludwig, LCSW Phone Number: 10/24/2018, 4:43 PM  Clinical Narrative:                  Patient is a 85 year old male who is alert and oriented x4.  Patient lives with his wife, and has not had home health before.  Patient stated he is hopeful that he will be able to return back home instead of going to SNF.  Patient stated he is interested in home health if he needs it.  Patient stated he does not have any problems paying for his medication, his wife and daughter take him to appointments.  Patient stated he has a housekeeper that comes in for cleaning.  Patient is open to home health if he needs it.  PT is currently recommending SNF placement, but patient is hopeful he can improve enough to go home with home health.  Patient was explained role of CSW and process for coordinating and working with home health agencies or SNFs which ever he needs to go to.  Patient did not express any other questions or concerns at this time.  Expected Discharge Plan: [place] Barriers to Discharge: Continued Medical Work up   Patient Goals and CMS Choice Patient states their goals for this hospitalization and ongoing recovery are:: To return back home with home health CMS Medicare.gov Compare Post Acute Care list provided to:: Patient Choice offered to / list presented to : Patient  Expected Discharge Plan and Services Expected Discharge Plan: [place]   Discharge Planning Services: CM Consult   Living arrangements for the past 2 months: Single Family Home                                Prior Living Arrangements/Services Living arrangements for the past 2 months: Single Family Home Lives with:: Spouse Patient language and need for interpreter reviewed:: No Do you feel safe  going back to the place where you live?: Yes      Need for Family Participation in Patient Care: No (Comment) Care giver support system in place?: No (comment) Current home services: Housekeeping Criminal Activity/Legal Involvement Pertinent to Current Situation/Hospitalization: No - Comment as needed  Activities of Daily Living Home Assistive Devices/Equipment: Cane (specify quad or straight), Walker (specify type) ADL Screening (condition at time of admission) Patient's cognitive ability adequate to safely complete daily activities?: Yes Is the patient deaf or have difficulty hearing?: No Does the patient have difficulty seeing, even when wearing glasses/contacts?: No Does the patient have difficulty concentrating, remembering, or making decisions?: No Patient able to express need for assistance with ADLs?: Yes Does the patient have difficulty dressing or bathing?: No Independently performs ADLs?: Yes (appropriate for developmental age) Does the patient have difficulty walking or climbing stairs?: Yes Weakness of Legs: Both Weakness of Arms/Hands: None  Permission Sought/Granted Permission sought to share information with : Case Manager, Customer service manager, Family Supports Permission granted to share information with : Yes, Verbal Permission Granted  Share Information with [place], [place] [phone]  [phone] or Cliffton Asters Daughter   [phone]   Permission granted to share info w AGENCY: [place]        Emotional Assessment  Appearance:: Appears stated age   Affect (typically observed): Stable, Appropriate Orientation: : Oriented to Self, Oriented to Place, Oriented to  Time, Oriented to Situation Alcohol / Substance Use: Not Applicable Psych Involvement: No (comment)  Admission diagnosis:  Healthcare-associated pneumonia [J18.9] Acute respiratory failure with hypoxia ([place]) [J96.01] Patient Active Problem List   Diagnosis Date  Noted  . HCAP (healthcare-associated pneumonia) 10/20/2018  . Malnutrition of moderate degree 07/18/2018  . Pneumonia 07/15/2018  . Hypotension 12/12/2017  . CHF (congestive heart failure) ([place]) 2020-08-2517  . Subclinical hypothyroidism 10/06/2015  . Allergic rhinitis 09/10/2015  . Bradycardia 09/10/2015  . Failure of erection 09/10/2015  . Blood glucose elevated 09/10/2015  . BP (high blood pressure) 09/10/2015  . Neuropathy 09/10/2015  . Apnea, sleep 09/10/2015  . Cancer of skin, squamous cell 09/10/2015  . Acid reflux 04/28/2015  . Arthritis, degenerative 12/25/2014  . Cardiomyopathy, ischemic 09/27/2013  . HLD (hyperlipidemia) 09/27/2013  . Lung nodule, multiple 09/27/2013  . Drug intolerance 09/27/2013  . Ischemic cardiomyopathy 12/01/2011  . Atrial fibrillation ([place]) 12/01/2011  . Chronic systolic heart failure ([place]) 12/01/2011  . IVCD (intraventricular conduction defect) 12/01/2011  . Intraventricular block 12/01/2011  . Mechanical complication of aortic graft ([place]) 12/02/2007  . CAD S/P percutaneous coronary angioplasty 01/25/2004  . Myocardial infarction ([place]) 01/25/2004  . AAA (abdominal aortic aneurysm) ([place]) 01/01/2004   PCP:  Jerrol Banana., MD Pharmacy:   [place], [address] [place] [address] Phone: [phone] Fax: [place], [place] - [address] [place] [address] Phone: [phone] Fax: [id number] [place], [place] - [address] [place] [address] Phone: [phone] Fax: [place], [place] - [address] [place] [address] Phone: [phone] Fax: [phone]     Social Determinants of Health (SDOH) Interventions     Readmission Risk Interventions Readmission Risk Prevention Plan 10/24/2018  Transportation Screening Complete  PCP or Specialist Appt within 3-5 Days Complete  HRI or Home Care Consult Complete  Social Work Consult for [place] Planning/Counseling Not Complete  SW consult not completed comments Patient feels that he does well with the support of his wife and daughter, he is not interested in a home health social worker.  Palliative Care Screening Not Applicable  Medication Review (RN Care Manager) Complete  Some recent data might be hidden

## 2018-10-24 NOTE — Evaluation (Signed)
Physical Therapy Evaluation Patient Details Name: KARDELL VIRGIL MRN: [id number] DOB: 09-13-1926 Today's Date: 10/24/2018   History of Present Illness   85 y.o. male with a known history of pneumonia in the past, chronic systolic heart failure with EF of 35%, coronary artery disease, abdominal aortic aneurysm, hyperlipidemia, hypertension, mitral regurgitation, atrial fibrillation on Coumadin presented to emergency room for shortness of breath and cough.   Has generalized weakness.  Recently treated for pneumonia (tested (-) for coronavirus)  Clinical Impression  Pt was here 3 months ago and was able to ambulate 150 ft, today he was requiring 5-6 L O2 and struggled to keep O2 in the 80s with any activity.  He showed good effort with most tasks but simply could not push himself today secondary to pulmonary issues.  He normally is independent at home but is not at all in a position to safely do so at this time, PT is recommending STR barring significant change in activity tolerance and safety with mobility.    Follow Up Recommendations SNF    Equipment Recommendations  None recommended by PT    Recommendations for Other Services       Precautions / Restrictions Precautions Precautions: Fall Restrictions Weight Bearing Restrictions: No      Mobility  Bed Mobility Overal bed mobility: Modified Independent             General bed mobility comments: Pt slow but determined to get to sitting w/o assist, able to do so but fatigued with the effort (O2 dropped from high 80s to low 70s on 5 liters)  Transfers Overall transfer level: Independent Equipment used: Rolling walker (2 wheeled)             General transfer comment: Pt is able to rise to standing w/o direct assist.  (Remained in sitting for a few minutes before getting up, with focused breathing until sats were in the low 90s)  Ambulation/Gait Ambulation/Gait assistance: Min assist Gait Distance (Feet): 45 Feet Assistive  device: Rolling walker (2 wheeled)       General Gait Details: Pt determined to get up and walk but fatigued much more quickly than he expected (O2 again quickly dropped to low 70s on 6L during modest amount of ambulation).  He stayed forward leaned on the walker and clearly is not at all near his reported baseline  Financial trader Rankin (Stroke Patients Only)       Balance Overall balance assessment: Needs assistance Sitting-balance support: Bilateral upper extremity supported Sitting balance-Leahy Scale: Good Sitting balance - Comments: Pt fatigued in sitting, but able to maintain baalnce     Standing balance-Leahy Scale: Fair Standing balance comment: Pt reliant on the walker with forward lean and quickly to fatigue.  Unable to trial standing w/o AD this date                             Pertinent Vitals/Pain Pain Assessment: No/denies pain    Home Living Family/patient expects to be discharged to:: Unsure Living Arrangements: Spouse/significant other                    Prior Function Level of Independence: Independent         Comments: 1 fall a few days ago, otherwise none in the last 6 months.  Pt normally drives, runs errands, etc  Hand Dominance        Extremity/Trunk Assessment   Upper Extremity Assessment Upper Extremity Assessment: Generalized weakness    Lower Extremity Assessment Lower Extremity Assessment: Generalized weakness       Communication   Communication: HOH  Cognition Arousal/Alertness: Awake/alert Behavior During Therapy: WFL for tasks assessed/performed Overall Cognitive Status: Within Functional Limits for tasks assessed                                        General Comments      Exercises     Assessment/Plan    PT Assessment Patient needs continued PT services  PT Problem List Decreased strength;Decreased range of motion;Decreased activity  tolerance;Decreased balance;Decreased mobility;Decreased knowledge of use of DME;Decreased safety awareness;Cardiopulmonary status limiting activity;Decreased coordination       PT Treatment Interventions DME instruction;Gait training;Stair training;Functional mobility training;Therapeutic activities;Therapeutic exercise;Balance training;Neuromuscular re-education;Patient/family education    PT Goals (Current goals can be found in the Care Plan section)  Acute Rehab PT Goals Patient Stated Goal: Go home PT Goal Formulation: With family Time For Goal Achievement: 11/07/18 Potential to Achieve Goals: Fair    Frequency Min 2X/week   Barriers to discharge        Co-evaluation               AM-PAC PT "6 Clicks" Mobility  Outcome Measure Help needed turning from your back to your side while in a flat bed without using bedrails?: None Help needed moving from lying on your back to sitting on the side of a flat bed without using bedrails?: A Little Help needed moving to and from a bed to a chair (including a wheelchair)?: None Help needed standing up from a chair using your arms (e.g., wheelchair or bedside chair)?: A Little Help needed to walk in hospital room?: A Lot Help needed climbing 3-5 steps with a railing? : Total 6 Click Score: 17    End of Session Equipment Utilized During Treatment: Gait belt;Oxygen(5L at rest, 6L during ambulation) Activity Tolerance: Patient limited by fatigue Patient left: with bed alarm set;with call bell/phone within reach Nurse Communication: Mobility status(need to have BM (suggested using bed pan 2/2 fatigue)) PT Visit Diagnosis: Muscle weakness (generalized) (M62.81);Difficulty in walking, not elsewhere classified (R26.2)    Time: 9892-1194 PT Time Calculation (min) (ACUTE ONLY): 32 min   Charges:   PT Evaluation $PT Eval Low Complexity: 1 Low PT Treatments $Therapeutic Activity: 8-22 mins        Kreg Shropshire, DPT 10/24/2018,  3:50 PM

## 2018-10-24 NOTE — Progress Notes (Signed)
Pulmonary Medicine          Date: 10/24/2018,   MRN# [id number] Joseph Barton 05/30/1927     AdmissionWeight: 83 kg                 CurrentWeight: 78.8 kg        SUBJECTIVE   Patient reports clinical improvement, states he walked around without oxygen yesterday.  Currently on 5 L/min O2.  Plan for weaning and DC on home O2 for short-term while on prednisone.   PAST MEDICAL HISTORY   Past Medical History:  Diagnosis Date   Abdominal aortic aneurysm ([place]) 01-2004   4.7 x 4.7 cm.   CAD (coronary artery disease) 01/25/2004   a. 01/2004 Ant MI/DES to LAD;  b. 10/2011 Neg MV.   CHF (congestive heart failure) ([facility])    Chronic systolic heart failure ([place])    a. 08/2016 Echo: EF 35%, nl RV fxn, mod to sev MR, mild to mod TR, mod biatrial enlargement.   Hyperlipidemia    Hypertension    Ischemic cardiomyopathy    a. 08/2016 Echo: EF 35%.   Moderate to Severe Mitral regurgitation    a. 08/2016 Echo: Mod-sev MR.   Permanent atrial fibrillation    a. Chronic coumadin (CHA2DS2VASc = 5).   Pulmonary nodules    Noted on abdominal CT   Statin intolerance      SURGICAL HISTORY   Past Surgical History:  Procedure Laterality Date   ABDOMINAL AORTIC ANEURYSM REPAIR     12/02/2007 [facility]   ABDOMINAL AORTIC ANEURYSM REPAIR  2006   [facility]    CARDIAC CATHETERIZATION  2009   [facility]   CORONARY ANGIOPLASTY WITH STENT PLACEMENT  2005   Cypher stent LAD    Cypher stents to LAD     01/25/2004   SKIN SURGERY  2016   [facility] skin cancer     FAMILY HISTORY   Family History  Problem Relation Age of Onset   Heart attack Father 48       Died w/ Heart attack   Diabetes Father    Heart attack Brother    Heart disease Brother    Diabetes Sister    Heart disease Sister      SOCIAL HISTORY   Social History   Tobacco Use   Smoking status: Former Smoker    Last attempt to quit: 12/01/1987    Years since quitting: 30.9   Smokeless tobacco: Never  Used  Substance Use Topics   Alcohol use: No   Drug use: No     MEDICATIONS    Home Medication:    Current Medication:  Current Facility-Administered Medications:    0.9 %  sodium chloride infusion, 250 mL, Intravenous, PRN, Pyreddy, Pavan, MD, Stopped at 10/22/18 1845   acetaminophen (TYLENOL) tablet 650 mg, 650 mg, Oral, Q6H PRN, Ojie, Jude, MD, 650 mg at 10/23/18 2244   albuterol (PROVENTIL) (2.5 MG/3ML) 0.083% nebulizer solution 2.5 mg, 2.5 mg, Inhalation, Q6H PRN, Pyreddy, Pavan, MD, 2.5 mg at 10/22/18 0240   benzonatate (TESSALON) capsule 100 mg, 100 mg, Oral, TID PRN, Stark Jock, Jude, MD, 100 mg at 10/22/18 5176   digoxin (LANOXIN) tablet 0.0625 mg, 0.0625 mg, Oral, Daily, Pyreddy, Pavan, MD, 0.0625 mg at 10/24/18 0925   docusate sodium (COLACE) capsule 200 mg, 200 mg, Oral, BID, Harrie Foreman, MD, 200 mg at 10/24/18 0925   furosemide (LASIX) injection 40 mg, 40 mg, Intravenous, Q12H, Ojie, Jude, MD, 40  mg at 10/24/18 4650   guaiFENesin-dextromethorphan (ROBITUSSIN DM) 100-10 MG/5ML syrup 5 mL, 5 mL, Oral, Q4H PRN, Ojie, Jude, MD, 5 mL at 10/22/18 1609   ipratropium-albuterol (DUONEB) 0.5-2.5 (3) MG/3ML nebulizer solution 3 mL, 3 mL, Nebulization, Q4H, Epifanio Lesches, MD, 3 mL at 10/24/18 0836   metoprolol succinate (TOPROL-XL) 24 hr tablet 12.5 mg, 12.5 mg, Oral, Daily, Pyreddy, Pavan, MD, 12.5 mg at 10/24/18 3546   multivitamin with minerals tablet 1 tablet, 1 tablet, Oral, Daily, Ojie, Jude, MD, 1 tablet at 10/24/18 0925   pantoprazole (PROTONIX) EC tablet 40 mg, 40 mg, Oral, Daily, Pyreddy, Pavan, MD, 40 mg at 10/24/18 0925   polyethylene glycol (MIRALAX / GLYCOLAX) packet 17 g, 17 g, Oral, Daily, Harrie Foreman, MD, 17 g at 10/24/18 5681   predniSONE (DELTASONE) tablet 40 mg, 40 mg, Oral, Q breakfast, Ottie Glazier, MD, 40 mg at 10/24/18 2751   sodium chloride flush (NS) 0.9 % injection 3 mL, 3 mL, Intravenous, Q12H, Pyreddy, Pavan, MD, 3 mL at  10/23/18 2047   sodium chloride flush (NS) 0.9 % injection 3 mL, 3 mL, Intravenous, PRN, Pyreddy, Reatha Harps, MD, 3 mL at 10/24/18 0641   tamsulosin (FLOMAX) capsule 0.4 mg, 0.4 mg, Oral, Daily, Pyreddy, Pavan, MD, 0.4 mg at 10/24/18 7001   Warfarin - Pharmacist Dosing Inpatient, , Does not apply, q1800, Ojie, Jude, MD    ALLERGIES   Atorvastatin and Simvastatin     REVIEW OF SYSTEMS    Review of Systems:  Gen:  Denies  fever, sweats, chills weigh loss  HEENT: Denies blurred vision, double vision, ear pain, eye pain, hearing loss, nose bleeds, sore throat Cardiac:  No dizziness, chest pain or heaviness, chest tightness,edema Resp:   Denies cough or sputum porduction, shortness of breath,wheezing, hemoptysis,  Gi: Denies swallowing difficulty, stomach pain, nausea or vomiting, diarrhea, constipation, bowel incontinence Gu:  Denies bladder incontinence, burning urine Ext:   Denies Joint pain, stiffness or swelling Skin: Denies  skin rash, easy bruising or bleeding or hives Endoc:  Denies polyuria, polydipsia , polyphagia or weight change Psych:   Denies depression, insomnia or hallucinations   Other:  All other systems negative   VS: BP 114/76 (BP Location: Left Arm)    Pulse 60    Temp 97.7 F (36.5 C) (Oral)    Resp 18    Ht 6\' 2"  (1.88 m)    Wt 78.8 kg    SpO2 92%    BMI 22.30 kg/m      PHYSICAL EXAM    GENERAL:NAD, no fevers, chills, no weakness no fatigue HEAD: Normocephalic, atraumatic.  EYES: Pupils equal, round, reactive to light. Extraocular muscles intact. No scleral icterus.  MOUTH: Moist mucosal membrane. Dentition intact. No abscess noted.  EAR, NOSE, THROAT: Clear without exudates. No external lesions.  NECK: Supple. No thyromegaly. No nodules. No JVD.  PULMONARY: Decreased breath sounds bilaterally. CARDIOVASCULAR: S1 and S2. Regular rate and rhythm. No murmurs, rubs, or gallops. No edema. Pedal pulses 2+ bilaterally.  GASTROINTESTINAL: Soft, nontender,  nondistended. No masses. Positive bowel sounds. No hepatosplenomegaly.  MUSCULOSKELETAL: No swelling, clubbing, or edema. Range of motion full in all extremities.  NEUROLOGIC: Cranial nerves II through XII are intact. No gross focal neurological deficits. Sensation intact. Reflexes intact.  SKIN: No ulceration, lesions, rashes, or cyanosis. Skin warm and dry. Turgor intact.  PSYCHIATRIC: Mood, affect within normal limits. The patient is awake, alert and oriented x 3. Insight, judgment intact.  IMAGING    Dg Chest 2 View  Result Date: 09/24/2018 CLINICAL DATA:  Follow-up right upper lobe pneumonia EXAM: CHEST - 2 VIEW COMPARISON:  07/15/2018 FINDINGS: Cardiac shadow is enlarged. Aortic calcifications are noted. Chronic interstitial changes are again seen bilaterally. The previously seen density in the right upper lobe is less prominent than that seen on the prior exam but still present. No new focal infiltrate is seen. No sizable effusion is noted. No acute bony abnormality is seen. IMPRESSION: Persistent but improving density in the right upper lobe. This may represent some residual scarring. Electronically Signed   By: Inez Catalina M.D.   On: 09/24/2018 10:54   Ct Chest Wo Contrast  Result Date: 10/23/2018 CLINICAL DATA:  Cough, shortness of breath. EXAM: CT CHEST WITHOUT CONTRAST TECHNIQUE: Multidetector CT imaging of the chest was performed following the standard protocol without IV contrast. COMPARISON:  Radiograph of October 22, 2018. CT scan of July 15, 2018. FINDINGS: Cardiovascular: Atherosclerosis of thoracic aorta is noted without aneurysm formation. Normal cardiac size. Coronary artery calcifications are noted. No pericardial effusion. Mediastinum/Nodes: Thyroid gland and esophagus are unremarkable. Mildly enlarged mediastinal adenopathy is noted which most likely is reactive or inflammatory in etiology. Lungs/Pleura: No pneumothorax or pleural effusion is noted. Emphysematous  disease is noted in both lungs. Bronchiectasis is noted in both lower lobes. There is interval development of diffuse reticular and interstitial densities throughout both lungs concerning for interstitial or atypical pneumonia or possibly edema. Upper Abdomen: No acute abnormality. Musculoskeletal: No chest wall mass or suspicious bone lesions identified. IMPRESSION: Interval development of diffuse and interstitial densities throughout both lungs concerning for interstitial or atypical pneumonia or possibly edema. Bronchiectasis is noted in both lower lobes. Mildly enlarged mediastinal adenopathy is noted which most likely is reactive or inflammatory in etiology. Coronary artery calcifications are noted suggesting coronary artery disease. Aortic Atherosclerosis (ICD10-I70.0) and Emphysema (ICD10-J43.9). Electronically Signed   By: Marijo Conception M.D.   On: 10/23/2018 10:15   Dg Chest Port 1 View  Result Date: 10/22/2018 CLINICAL DATA:  Shortness of breath. EXAM: PORTABLE CHEST 1 VIEW COMPARISON:  Chest x-ray dated October 20, 2018. FINDINGS: Stable cardiomediastinal silhouette. Bilateral diffuse interstitial and alveolar airspace opacities with peripheral predominance again noted, slightly worsened in both upper lobes. No pleural effusion or pneumothorax. No acute osseous abnormality. IMPRESSION: 1. Peripheral predominant bilateral interstitial and alveolar airspace disease, worsened in both upper lobes. Electronically Signed   By: Titus Dubin M.D.   On: 10/22/2018 19:41   Dg Chest Portable 1 View  Result Date: 10/20/2018 CLINICAL DATA:  Shortness of breath, hypoxia EXAM: PORTABLE CHEST 1 VIEW COMPARISON:  09/24/2018 FINDINGS: Bilateral diffuse interstitial and alveolar airspace opacities with a peripheral predominance. No pleural effusion or pneumothorax. Stable cardiomediastinal silhouette. No aggressive osseous lesion. IMPRESSION: 1. Chronic interstitial lung disease. 2. Increased bilateral  interstitial and alveolar airspace opacities with a peripheral predominance. Differential considerations include multilobar pneumonia including atypical infection such as viral pneumonia. 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 Delaine Lame          -will start prednisone 40 mg p.o. daily plan for 2 months slow taper          -  need to stop amiodarone and initiate alternative antiarrhythmic          - home with O2 at least for short term           - pulmonary clinic follow up will set up post d/c           -Atelectasis - please provide patient with Acapella flutter valve and IS to take home with him. Chest PT today.                         Thank you for allowing me to participate in the care of this patient.   Patient/Family are satisfied with care plan and all questions have been answered.  This document was prepared using Dragon voice recognition software and may include unintentional dictation errors.     Ottie Glazier, M.D.  Division of [place]

## 2018-10-24 NOTE — Progress Notes (Signed)
Date of Admission:  10/20/2018     ID: Joseph Barton is a 85 y.o. male   Subjective: Having a rough time Still sob Says he had some chest paina nd was given NTG and tjhat dropped his BP but better now Did not sleep last night and wants a pill  Medications:  . digoxin  0.0625 mg Oral Daily  . docusate sodium  200 mg Oral BID  . [START ON 10/25/2018] furosemide  40 mg Intravenous Daily  . ipratropium-albuterol  3 mL Nebulization TID  . metoprolol succinate  12.5 mg Oral Daily  . multivitamin with minerals  1 tablet Oral Daily  . nitroGLYCERIN      . pantoprazole  40 mg Oral Daily  . polyethylene glycol  17 g Oral Daily  . predniSONE  40 mg Oral Q breakfast  . sodium chloride flush  3 mL Intravenous Q12H  . tamsulosin  0.4 mg Oral Daily  . Warfarin - Pharmacist Dosing Inpatient   Does not apply q1800    Objective: Vital signs in last 24 hours: Temp:  [97.5 F (36.4 C)-98 F (36.7 C)] 97.5 F (36.4 C) (04/23 1727) Pulse Rate:  [48-69] 49 (04/23 1745) Resp:  [18-20] 18 (04/23 1353) BP: (96-117)/(56-76) 96/58 (04/23 1745) SpO2:  [88 %-96 %] 88 % (04/23 1745) Weight:  [78.8 kg] 78.8 kg (04/23 0436)  PHYSICAL EXAM:  General: Alert, cooperative, in some  distress, appears stated age.  Lungs: b/l air entry crepts  Heart: irregular, slow rate Abdomen: Soft, non-tender,not distended. Bowel sounds normal. No masses Extremities: atraumatic, no cyanosis. No edema. No clubbing Skin: No rashes or lesions. Or bruising Lymph: Cervical, supraclavicular normal. Neurologic: Grossly non-focal  Lab Results Recent Labs    10/23/18 0326 10/24/18 0331  WBC 7.7 14.7*  HGB 12.4* 12.1*  HCT 37.6* 36.6*  NA 133* 135  K 4.3 4.4  CL 102 102  CO2 21* 23  BUN 25* 33*  CREATININE 1.14 1.01   Liver Panel No results for input(s): PROT, ALBUMIN, AST, ALT, ALKPHOS, BILITOT, BILIDIR, IBILI in the last 72 hours. Sedimentation Rate No results for input(s): ESRSEDRATE in the last 72  hours. C-Reactive Protein No results for input(s): CRP in the last 72 hours.  Microbiology:  ECHo The left ventricle has severely reduced systolic function, with an ejection fraction of 25-30%. The cavity size was normal.Global hypokinesis, basal wall motion best preserved. Severe hypokinesis of the anterior, anteroseptal and apical regions.  Unable to determine diastolic parameters  Studies/Results: Ct Chest Wo Contrast  Result Date: 10/23/2018 CLINICAL DATA:  Cough, shortness of breath. EXAM: CT CHEST WITHOUT CONTRAST TECHNIQUE: Multidetector CT imaging of the chest was performed following the standard protocol without IV contrast. COMPARISON:  Radiograph of October 22, 2018. CT scan of July 15, 2018. FINDINGS: Cardiovascular: Atherosclerosis of thoracic aorta is noted without aneurysm formation. Normal cardiac size. Coronary artery calcifications are noted. No pericardial effusion. Mediastinum/Nodes: Thyroid gland and esophagus are unremarkable. Mildly enlarged mediastinal adenopathy is noted which most likely is reactive or inflammatory in etiology. Lungs/Pleura: No pneumothorax or pleural effusion is noted. Emphysematous disease is noted in both lungs. Bronchiectasis is noted in both lower lobes. There is interval development of diffuse reticular and interstitial densities throughout both lungs concerning for interstitial or atypical pneumonia or possibly edema. Upper Abdomen: No acute abnormality. Musculoskeletal: No chest wall mass or suspicious bone lesions identified. IMPRESSION: Interval development of diffuse and interstitial densities throughout both lungs concerning for interstitial or atypical  pneumonia or possibly edema. Bronchiectasis is noted in both lower lobes. Mildly enlarged mediastinal adenopathy is noted which most likely is reactive or inflammatory in etiology. Coronary artery calcifications are noted suggesting coronary artery disease. Aortic Atherosclerosis (ICD10-I70.0) and  Emphysema (ICD10-J43.9). Electronically Signed   By: Marijo Conception M.D.   On: 10/23/2018 10:15   Dg Chest Port 1 View  Result Date: 10/22/2018 CLINICAL DATA:  Shortness of breath. EXAM: PORTABLE CHEST 1 VIEW COMPARISON:  Chest x-ray dated October 20, 2018. FINDINGS: Stable cardiomediastinal silhouette. Bilateral diffuse interstitial and alveolar airspace opacities with peripheral predominance again noted, slightly worsened in both upper lobes. No pleural effusion or pneumothorax. No acute osseous abnormality. IMPRESSION: 1. Peripheral predominant bilateral interstitial and alveolar airspace disease, worsened in both upper lobes. Electronically Signed   By: Titus Dubin M.D.   On: 10/22/2018 19:41     Assessment/Plan: 85 y.o. male with a history of CHF, AFIB on coumadin, AAA repair in 2009. Presented  with sob and feeling weak Pt apparently had pulse ox of 83% at home. When EMS was called his vitals were okay and EKG had showed Afib  Pt says he has been having some weakness for the past 2-3 weeks intermittently. HE was in [facility]  In jan 2020 admitted for 6 days for Afib and sob and was started on Amiodarone 1/15 received IV amiodarone drip and then discharged on 1/18  400mg  BID for 30 days and then on 200mg  once a daily. He was also treated for pneumonia then Pt was continuing to have SOB and an CXR done on 3/24 did not show any pneumonia. Pt insisted on getting antibiotics but was not given rightfully by the PA-C.  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

## 2018-10-24 NOTE — Progress Notes (Signed)
1738--Pt complaining of left-sided chest pain, 5/10.  Denies N/V, SOB, non-radiating CP.  NTG SL given.  O2 at 5L/min [place]; SpO2--94  1745--Pt reports that CP is "nearly gone".  BP 96/58.  Unable to administer 2nd dose of ntg sl.

## 2018-10-24 NOTE — Progress Notes (Addendum)
[facility] [place] NAME: Joseph Barton    MR#:  [id number]  DATE OF BIRTH:  [birth year]  SUBJECTIVE:  CHIEF COMPLAINT:   Chief Complaint  Patient presents with  . Weakness   No new complaint this morning.  No fevers.  Patient did not have any worsening shortness of breath last night.  Still has some intermittent cough.  Stated he did not sleep very well but not due to respiratory distress. Still on high flow oxygen which is being gradually weaned down by nursing staff.  REVIEW OF SYSTEMS:  Review of Systems  Constitutional: Negative for chills and fever.  HENT: Negative for hearing loss and tinnitus.   Eyes: Negative for blurred vision and double vision.  Respiratory: Positive for cough and shortness of breath.        Shortness of breath improving  Cardiovascular: Negative for chest pain and palpitations.  Gastrointestinal: Negative for abdominal pain, heartburn, nausea and vomiting.  Genitourinary: Negative for dysuria and urgency.  Musculoskeletal: Negative for myalgias and neck pain.  Skin: Negative for itching and rash.  Neurological: Negative for dizziness and headaches.  Psychiatric/Behavioral: Negative for depression and hallucinations.    DRUG ALLERGIES:   Allergies  Allergen Reactions  . Atorvastatin   . Simvastatin    VITALS:  Blood pressure 114/76, pulse 60, temperature 97.7 F (36.5 C), temperature source Oral, resp. rate 18, height 6\' 2"  (1.88 m), weight 78.8 kg, SpO2 92 %. PHYSICAL EXAMINATION:   Physical Exam  Constitutional: He is oriented to person, place, and time. He appears well-developed and well-nourished.  HENT:  Head: Normocephalic and atraumatic.  Right Ear: External ear normal.  Eyes: Pupils are equal, round, and reactive to light. Conjunctivae are normal. Right eye exhibits no discharge. No scleral icterus.  Neck: Normal range of motion. Neck supple. No tracheal deviation present.  Cardiovascular:   Irregularly irregular  Respiratory: Effort normal. He has rales.  GI: Soft. Bowel sounds are normal. There is no abdominal tenderness.  Musculoskeletal: Normal range of motion.        General: No edema.     Comments: Chronic venous stasis changes to both lower extremity.  Neurological: He is alert and oriented to person, place, and time. No cranial nerve deficit.  Skin: Skin is warm. He is not diaphoretic. No erythema.  Psychiatric: He has a normal mood and affect. His behavior is normal.   LABORATORY PANEL:  Male CBC Recent Labs  Lab 10/24/18 0331  WBC 14.7*  HGB 12.1*  HCT 36.6*  PLT 206   ------------------------------------------------------------------------------------------------------------------ Chemistries  Recent Labs  Lab 10/20/18 1840  10/24/18 0331  NA 135   < > 135  K 4.4   < > 4.4  CL 103   < > 102  CO2 23   < > 23  GLUCOSE 119*   < > 166*  BUN 27*   < > 33*  CREATININE 1.24   < > 1.01  CALCIUM 7.9*   < > 8.5*  MG  --    < > 2.0  AST 30  --   --   ALT 20  --   --   ALKPHOS 92  --   --   BILITOT 0.4  --   --    < > = values in this interval not displayed.   RADIOLOGY:  No results found. ASSESSMENT AND PLAN:  85 year old male patient with a known history of pneumonia in the past,  chronic systolic heart failure with EF of 25-30%, coronary artery disease, abdominal aortic aneurysm, hyperlipidemia, hypertension, mitral regurgitation, atrial fibrillation on Coumadin presented to emergency room for shortness of breath and cough.   1.Acute respiratory failure with hypoxia Initially felt to be secondary to pneumonia.  Patient evaluated by infectious disease.  Patient was not responding to antibiotics.  Antibiotics already discontinued since no clinical evidence to support pneumonia. Pneumonia ruled out. Recent CT chest without contrast done for further evaluation revealed interval development of diffuse and interstitial densities throughout both lungs  concerning for interstitial or atypical pneumonia or possibly edema. Bronchiectasis is noted in both lower lobes. 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  Patient has been on amiodarone since January 2020.  Appreciate input from infectious disease specialist and pulmonologist. Amiodarone already discontinued.   Patient started on prednisone 40 mg p.o. daily with plans for 2 months slow taper.  Patient currently on high flow oxygen at 5 L and nursing staff gradually weaning down.  Patient will most likely require home oxygen therapy on discharge.  Patient to follow-up with pulmonary clinic post discharge. Patient will also need Acapella flutter valve and incentive spirometer to take home with him on discharge. [id number] test was negative as such COVID infection ruled out. Pulmonary embolism very unlikely since patient already on anticoagulation with INR supratherapeutic at 3.3.  2.Multilobar pneumonia ruled out Has been ruled out.  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  So far no growth on blood cultures. Coronavirus test negative.  COVID 19 ruled out  3.Chronic atrial fibrillation Patient on Coumadin with INR supratherapeutic at 3.3.  Pharmacy assisting with dosing with goal to resume once INR less than 3 Amiodarone discontinued during this admission due to pulmonary toxicity.  Will consult patient's cardiologist regarding alternative antiarrhythmic if indicated.   4. Hypertension Blood pressure controlled on current regimen  Lisinopril discontinued recently due to borderline low blood pressures  5.  Chronic systolic CHF Ejection fraction of 25 to 30%.  Lasix was initially placed on hold on admission due to concern for dehydration. Due to worsening respiratory status, Lasix already resumed  Continue  beta-blocker.  Lisinopril on hold due to blood pressure being borderline recently..  DVT prophylaxis; patient already on anticoagulation with Coumadin  I called patient's spouse listed in chart Ms. Santina Evans today.  Updated her on patient's clinical condition.  All questions were answered and she is in agreement with the plan of care.  All the records are reviewed and case discussed with Care Management/Social Worker. Management plans discussed with the patient, and they are in agreement.  CODE STATUS: DNR  TOTAL TIME TAKING CARE OF THIS PATIENT: 36 minutes.   More than 50% of the time was spent in counseling/coordination of care: YES  POSSIBLE D/C IN 2 DAYS, DEPENDING ON CLINICAL CONDITION.   Sammye Staff M.D on 10/24/2018 at 12:17 PM  Between 7am to 6pm - Pager - [phone]  After 6pm go to [email or web address] - password [email or web address] [facility]  [facility]  Office  [phone]  CC: Primary care physician; Jerrol Banana., MD  Note: This dictation was prepared with Dragon dictation along with smaller phrase technology. Any transcriptional errors that result from this process are unintentional.

## 2018-10-25 DIAGNOSIS — I472 Ventricular tachycardia: Secondary | ICD-10-CM

## 2018-10-25 DIAGNOSIS — I25118 Atherosclerotic heart disease of native coronary artery with other forms of angina pectoris: Secondary | ICD-10-CM

## 2018-10-25 DIAGNOSIS — T462X5A Adverse effect of other antidysrhythmic drugs, initial encounter: Secondary | ICD-10-CM

## 2018-10-25 DIAGNOSIS — J9601 Acute respiratory failure with hypoxia: Principal | ICD-10-CM

## 2018-10-25 DIAGNOSIS — I4821 Permanent atrial fibrillation: Secondary | ICD-10-CM

## 2018-10-25 DIAGNOSIS — J984 Other disorders of lung: Secondary | ICD-10-CM

## 2018-10-25 LAB — MAGNESIUM: Magnesium: 2.1 mg/dL (ref 1.7–2.4)

## 2018-10-25 LAB — BASIC METABOLIC PANEL
Anion gap: 7 (ref 5–15)
BUN: 36 mg/dL — ABNORMAL HIGH (ref 8–23)
CO2: 28 mmol/L (ref 22–32)
Calcium: 8.4 mg/dL — ABNORMAL LOW (ref 8.9–10.3)
Chloride: 99 mmol/L (ref 98–111)
Creatinine, Ser: 1.04 mg/dL (ref 0.61–1.24)
GFR calc Af Amer: >60 mL/min (ref >60)
GFR calc non Af Amer: >60 mL/min (ref >60)
Glucose, Bld: 103 mg/dL — ABNORMAL HIGH (ref 70–99)
Potassium: 4.7 mmol/L (ref 3.5–5.1)
Sodium: 134 mmol/L — ABNORMAL LOW (ref 135–145)

## 2018-10-25 LAB — CULTURE, BLOOD (ROUTINE X 2)
Culture: NO GROWTH
Culture: NO GROWTH
Special Requests: ADEQUATE
Special Requests: ADEQUATE

## 2018-10-25 LAB — PROTIME-INR
INR: 4.1 (ref 0.8–1.2)
Prothrombin Time: 39 seconds — ABNORMAL HIGH (ref 11.4–15.2)

## 2018-10-25 LAB — CBC
HCT: 38.2 % — ABNORMAL LOW (ref 39.0–52.0)
Hemoglobin: 12.6 g/dL — ABNORMAL LOW (ref 13.0–17.0)
MCH: 30 pg (ref 26.0–34.0)
MCHC: 33 g/dL (ref 30.0–36.0)
MCV: 91 fL (ref 80.0–100.0)
Platelets: 194 10*3/uL (ref 150–400)
RBC: 4.2 MIL/uL — ABNORMAL LOW (ref 4.22–5.81)
RDW: 14.3 % (ref 11.5–15.5)
WBC: 11 10*3/uL — ABNORMAL HIGH (ref 4.0–10.5)
nRBC: 0 % (ref 0.0–0.2)

## 2018-10-25 LAB — DIGOXIN LEVEL: Digoxin Level: 0.6 ng/mL — ABNORMAL LOW (ref 0.8–2.0)

## 2018-10-25 MED ORDER — SULFAMETHOXAZOLE-TRIMETHOPRIM 400-80 MG PO TABS
1.0000 | ORAL_TABLET | Freq: Every day | ORAL | Status: DC
  Administered 2018-10-25 – 2018-11-01 (×8): 1 via ORAL
  Filled 2018-10-25 (×8): qty 1

## 2018-10-25 NOTE — Plan of Care (Signed)
  Problem: Education: Goal: Knowledge of General Education information will improve Description Including pain rating scale, medication(s)/side effects and non-pharmacologic comfort measures Outcome: Progressing   Problem: Health Behavior/Discharge Planning: Goal: Ability to manage health-related needs will improve Outcome: Progressing   Problem: Clinical Measurements: Goal: Ability to maintain clinical measurements within normal limits will improve Outcome: Progressing Goal: Will remain free from infection Outcome: Progressing Goal: Diagnostic test results will improve Outcome: Progressing Goal: Respiratory complications will improve Outcome: Progressing Goal: Cardiovascular complication will be avoided Outcome: Progressing   Problem: Nutrition: Goal: Adequate nutrition will be maintained Outcome: Progressing   Problem: Safety: Goal: Ability to remain free from injury will improve Outcome: Progressing   Problem: Activity: Goal: Ability to tolerate increased activity will improve Outcome: Progressing   Problem: Clinical Measurements: Goal: Ability to maintain a body temperature in the normal range will improve Outcome: Progressing   Problem: Respiratory: Goal: Ability to maintain adequate ventilation will improve Outcome: Progressing Goal: Ability to maintain a clear airway will improve Outcome: Progressing

## 2018-10-25 NOTE — Progress Notes (Signed)
[facility] at [place] NAME: Joseph Barton    MR#:  [id number]  DATE OF BIRTH:  1927/03/05  SUBJECTIVE:  CHIEF COMPLAINT:   Chief Complaint  Patient presents with  . Weakness   Patient had an episode of chest discomfort yesterday which is resolved.  Troponin was negative.  Twelve-lead EKG unremarkable.  Rested better last night.  No fevers overnight. Patient still requiring 5 L of oxygen via nasal cannula this morning  REVIEW OF SYSTEMS:  Review of Systems  Constitutional: Negative for chills and fever.  HENT: Negative for hearing loss and tinnitus.   Eyes: Negative for blurred vision and double vision.  Respiratory: Positive for cough and shortness of breath.        Shortness of breath improving  Cardiovascular: Negative for chest pain and palpitations.  Gastrointestinal: Negative for abdominal pain, heartburn, nausea and vomiting.  Genitourinary: Negative for dysuria and urgency.  Musculoskeletal: Negative for myalgias and neck pain.  Skin: Negative for itching and rash.  Neurological: Negative for dizziness and headaches.  Psychiatric/Behavioral: Negative for depression and hallucinations.    DRUG ALLERGIES:   Allergies  Allergen Reactions  . Atorvastatin   . Simvastatin    VITALS:  Blood pressure (!) 111/56, pulse 66, temperature 97.8 F (36.6 C), temperature source Oral, resp. rate 20, height 6\' 2"  (1.88 m), weight 78.2 kg, SpO2 90 %. PHYSICAL EXAMINATION:   Physical Exam  Constitutional: He is oriented to person, place, and time. He appears well-developed and well-nourished.  HENT:  Head: Normocephalic and atraumatic.  Right Ear: External ear normal.  Eyes: Pupils are equal, round, and reactive to light. Conjunctivae are normal. Right eye exhibits no discharge. No scleral icterus.  Neck: Normal range of motion. Neck supple. No tracheal deviation present.  Cardiovascular:  Irregularly irregular  Respiratory: Effort  normal. He has rales.  GI: Soft. Bowel sounds are normal. There is no abdominal tenderness.  Musculoskeletal: Normal range of motion.        General: No edema.     Comments: Chronic venous stasis changes to both lower extremity.  Neurological: He is alert and oriented to person, place, and time. No cranial nerve deficit.  Skin: Skin is warm. He is not diaphoretic. No erythema.  Psychiatric: He has a normal mood and affect. His behavior is normal.   LABORATORY PANEL:  Male CBC Recent Labs  Lab 10/25/18 0543  WBC 11.0*  HGB 12.6*  HCT 38.2*  PLT 194   ------------------------------------------------------------------------------------------------------------------ Chemistries  Recent Labs  Lab 10/20/18 1840  10/25/18 0543  NA 135   < > 134*  K 4.4   < > 4.7  CL 103   < > 99  CO2 23   < > 28  GLUCOSE 119*   < > 103*  BUN 27*   < > 36*  CREATININE 1.24   < > 1.04  CALCIUM 7.9*   < > 8.4*  MG  --    < > 2.1  AST 30  --   --   ALT 20  --   --   ALKPHOS 92  --   --   BILITOT 0.4  --   --    < > = values in this interval not displayed.   RADIOLOGY:  No results found. ASSESSMENT AND PLAN:  85 year old male patient with a known history of pneumonia in the past, chronic systolic heart failure with EF of 25-30%, coronary artery disease, abdominal aortic  aneurysm, hyperlipidemia, hypertension, mitral regurgitation, atrial fibrillation on Coumadin presented to emergency room for shortness of breath and cough.   1.Acute respiratory failure with hypoxia Initially felt to be secondary to pneumonia.  Patient evaluated by infectious disease.  Patient was not responding to antibiotics.  Antibiotics already discontinued since no clinical evidence to support pneumonia. Pneumonia ruled out. Recent CT chest without contrast done for further evaluation revealed interval development of diffuse and interstitial densities throughout both lungs concerning for interstitial or atypical pneumonia or  possibly edema. Bronchiectasis is noted in both lower lobes. 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  Patient has been on amiodarone since January 2020.  Appreciate input from infectious disease specialist and pulmonologist. Amiodarone already discontinued.   Patient started on prednisone 40 mg p.o. daily with plans for 2 months slow taper.   Infectious disease specialist recommended Bactrim SS 1 tablet daily for PCP prophylaxis since patient is going to be on prednisone for more than 30 days which has been ordered Gradually weaning down oxygen requirement.  Patient was still requiring 5 L this morning.  We will continue to wean down as tolerated. Patient will most likely require home oxygen therapy on discharge.  Patient to follow-up with pulmonary clinic post discharge. Patient will also need Acapella flutter valve and incentive spirometer to take home with him on discharge. [id number] test was negative as such COVID infection ruled out. Pulmonary embolism very unlikely since patient already on anticoagulation with INR supratherapeutic at 4.1  2.Multilobar pneumonia ruled out Has been ruled out.  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  So far no growth on blood cultures. Coronavirus test negative.  COVID 19 ruled out  3.Chronic atrial fibrillation Patient on Coumadin with INR supratherapeutic at 4.1.  Pharmacy assisting with dosing with goal to resume once INR less than 3 Amiodarone discontinued during this admission due to pulmonary toxicity.   I consulted patient's cardiologist Dr.  Rockey Situ who reviewed the chart confirmed no inpatient indication for alternative antiarrhythmic therapy at this time.  To follow-up with his electrophysiologist upon discharge   4. Hypertension Blood pressure controlled on current  regimen  Lisinopril discontinued recently due to borderline low blood pressures  5.  Chronic systolic CHF Ejection fraction of 25 to 30%.  Lasix was initially placed on hold on admission due to concern for dehydration. Due to worsening respiratory status, Lasix already resumed  Continue beta-blocker.  Lisinopril on hold due to blood pressure being borderline recently.. To resume in a.m. if blood pressure remains stable  DVT prophylaxis; pharmacist dosing Coumadin.  INR supratherapeutic at 4.1  I called patient's spouse listed in chart Ms. Santina Evans again today.  Updated her on patient's clinical condition.  All questions were answered and she is in agreement with the plan of care.  All the records are reviewed and case discussed with Care Management/Social Worker. Management plans discussed with the patient, and they are in agreement.  CODE STATUS: DNR  TOTAL TIME TAKING CARE OF THIS PATIENT: 37 minutes.   More than 50% of the time was spent in counseling/coordination of care: YES  POSSIBLE D/C IN 2 DAYS, DEPENDING ON CLINICAL CONDITION.   Larenda Reedy M.D on 10/25/2018 at 11:18 AM  Between 7am to 6pm - Pager - [phone]  After 6pm go to [email or web address] - Proofreader  [employer]  [phone]  CC: Primary care physician; Jerrol Banana., MD  Note: This dictation was prepared with Dragon dictation along with smaller phrase technology. Any transcriptional errors that result from this process are unintentional.

## 2018-10-25 NOTE — Progress Notes (Signed)
Physical Therapy Treatment Patient Details Name: Joseph Barton MRN: [id number] DOB: 02/27/1927 Today's Date: 10/25/2018    History of Present Illness  85 y.o. male with a known history of pneumonia in the past, chronic systolic heart failure with EF of 35%, coronary artery disease, abdominal aortic aneurysm, hyperlipidemia, hypertension, mitral regurgitation, atrial fibrillation on Coumadin presented to emergency room for shortness of breath and cough.   Has generalized weakness.  Recently treated for pneumonia (tested (-) for coronavirus)    PT Comments    Patient is able to complete STS transfer with modI and O2 sats quickly rising above 90% with PLB. Patient is able to ambulate with decent carry over of PLB, with O2 dropping to 83%, which he is able to elevate to 90% following sitting for 5 minutes and PLB.  PT spent time explaining purpose of PLB as patient reports he doesn't quite understand reasoning behind it. Would benefit from skilled PT to address above deficits and promote optimal return to PLOF     Follow Up Recommendations  SNF     Equipment Recommendations  None recommended by PT    Recommendations for Other Services       Precautions / Restrictions      Mobility  Bed Mobility Overal bed mobility: Modified Independent             General bed mobility comments: Pt slow but determined to get to sitting w/o assist, able to do so but fatigued with the effort (O2 dropped from high 80s to low 70s on 5 liters)  Transfers Overall transfer level: Independent Equipment used: Rolling walker (2 wheeled)             General transfer comment: Pt O2 88 in sitting, able to raise to 90% with PLB, able to complete STS with cuing for initial hand placement, with O2 decreasing to 8^% able to raise to 90% with PLB prior to ambulation  Ambulation/Gait Ambulation/Gait assistance: Min guard Gait Distance (Feet): 40 Feet Assistive device: Rolling walker (2 wheeled)   Gait  velocity: decreased   General Gait Details: Ambulated on 6L. Cuing to "stay inside walker". Patient able to ambulate to door with fatigue. FOllowing O2 sat to 83-84% with patient able to raise to 90% following sitting and PLB   Stairs             Wheelchair Mobility    Modified Rankin (Stroke Patients Only)       Balance                                            Cognition                                              Exercises Other Exercises Other Exercises: PT cued patient through safety with STS> PT educated patient on PLB and need to continue this through ambulation to maintain O2in normal levels. min guard for safety with ambulation.     General Comments        Pertinent Vitals/Pain Pain Assessment: No/denies pain    Home Living                      Prior Function  PT Goals (current goals can now be found in the care plan section) Acute Rehab PT Goals Patient Stated Goal: Go home PT Goal Formulation: With family Time For Goal Achievement: 11/07/18 Potential to Achieve Goals: Fair    Frequency    Min 2X/week      PT Plan      Co-evaluation              AM-PAC PT "6 Clicks" Mobility   Outcome Measure  Help needed turning from your back to your side while in a flat bed without using bedrails?: None Help needed moving from lying on your back to sitting on the side of a flat bed without using bedrails?: A Little Help needed moving to and from a bed to a chair (including a wheelchair)?: None Help needed standing up from a chair using your arms (e.g., wheelchair or bedside chair)?: A Little Help needed to walk in hospital room?: A Little Help needed climbing 3-5 steps with a railing? : Total 6 Click Score: 18    End of Session Equipment Utilized During Treatment: Gait belt;Oxygen Activity Tolerance: Patient limited by fatigue Patient left: with call bell/phone within reach;in  chair;with chair alarm set Nurse Communication: Mobility status PT Visit Diagnosis: Muscle weakness (generalized) (M62.81);Difficulty in walking, not elsewhere classified (R26.2)     Time: 3220-2542 PT Time Calculation (min) (ACUTE ONLY): 20 min  Charges:  $Therapeutic Activity: 8-22 mins                     Shelton Silvas PT, DPT   Shelton Silvas 10/25/2018, 4:40 PM

## 2018-10-25 NOTE — Consult Note (Signed)
Cardiology Consultation:   Patient ID: Joseph Barton MRN: [id number]; DOB: 03/11/1927  Admit date: 10/20/2018 Date of Consult: 10/25/2018  Primary Care Provider: Jerrol Banana., MD Primary Cardiologist:New, Dr. Rockey Situ Primary Electrophysiologist: Dr. Caryl Comes Physician requesting consult Dr. Stark Jock Reason for consult: Nonsustained VT, alternatives for antiarrhythmic therapy  Patient Profile:   Joseph Barton is a 85 y.o. male with a hx of permanent Afib (Coumadin), HFrEF d/t ICM (EF 93%) and complicated by moderate-severe MR, CAD with remote PCI to LAD (2005), AAA (2005) s/p repair (2006), HLD (noted statin intolerance), HTN, neuropathy, and pulmonary nodules , pneumonia December into January 2020 with difficult to control atrial fibrillation, nonsustained VT , presenting with acute respiratory distress  History of Present Illness:   Joseph Barton is an 85 year old male with complex PMH as above most notable for permanent atrial fibrillation, moderate to severe mitral regurgitation, systolic heart failure, and ICM, severe underlying lung disease, pneumonia December 2019 extending into January 2020, management of nonsustained VT at that time with amiodarone.  In jan 2020 admitted for 6 days for respiratory distress with concern for pneumonia, atrial fibrillation with RVR, nonsustained VT noted on telemetry, treated with amiodarone  1/15 received IV amiodarone drip and then discharged on 1/18  400mg  BID for 30 days and then on 200mg  once a daily.  Reports he was doing well until the end of March 2020, when he felt like his pneumonia was coming back, general malaise, weakness, coughing up green phlegm.  Symptoms seem to get worse and finally presented to the hospital several weeks later on October 20 2018  Initial x-ray on arrival concerning for multi lobar pneumonia versus atypical infection including viral pneumonia Worse in the upper lobes on repeat x-ray April 21  CT scan October 23, 2018 Interval development of diffuse and interstitial densities throughout both lungs concerning for interstitial or atypical pneumonia or possibly edema. Bronchiectasis is noted in both lower lobes. Mildly enlarged mediastinal adenopathy is noted which most likely is reactive or inflammatory in etiology.  He is currently maintained on 4-1/2 L, has hypoxia with exertion notes indicating saturations into the mid to low 80s walking around his room with oxygen with quick recovery into the low 90s at rest  Reports that prior to this admission did not need oxygen  COVID test was neg- Respiratory panel negative for viral infections   Other past medical history reviewed  on 12/2017, [facility] hospital admission for evaluation of shortness of breath, edema, and chest pain.  [facility] consulted with patient was started on IV diuresis. Subsequent echo on 12/2017 showed improved LVEF 25 to 35%, moderate MR, moderate to severe dilation of the left atrium, moderate right atrium dilation, moderate TR, and mildly dilated right ventricles. Discharged with Lasix 40mg   twice daily, lisinopril 2.5 mg p.o. daily, Toprol-XL 25 mg p.o. daily, potassium supplementation, and warfarin 2.5 mg. Digoxin was stopped at discharge. CHF clinic follow-up scheduled. On 06/2018, patient seen again as outpatient by Dr. Caryl Comes with discussion regarding candidacy for MitraClip. Per documentation, patient was transitioned from lisinopril to [facility] at that time.   Past Medical History:  Diagnosis Date   Abdominal aortic aneurysm ([place]) 01-2004   4.7 x 4.7 cm.   CAD (coronary artery disease) 01/25/2004   a. 01/2004 Ant MI/DES to LAD;  b. 10/2011 Neg MV.   CHF (congestive heart failure) ([place])    Chronic systolic heart failure ([place])    a. 08/2016 Echo: EF 35%, nl RV fxn, mod to  sev MR, mild to mod TR, mod biatrial enlargement.   Hyperlipidemia    Hypertension    Ischemic cardiomyopathy    a. 08/2016 Echo: EF 35%.   Moderate to Severe  Mitral regurgitation    a. 08/2016 Echo: Mod-sev MR.   Permanent atrial fibrillation    a. Chronic coumadin (CHA2DS2VASc = 5).   Pulmonary nodules    Noted on abdominal CT   Statin intolerance     Past Surgical History:  Procedure Laterality Date   ABDOMINAL AORTIC ANEURYSM REPAIR     12/02/2007 [facility]   ABDOMINAL AORTIC ANEURYSM REPAIR  2006   [facility]    CARDIAC CATHETERIZATION  2009   [facility]   CORONARY ANGIOPLASTY WITH STENT PLACEMENT  2005   Cypher stent LAD    Cypher stents to LAD     01/25/2004   SKIN SURGERY  2016   [facility] skin cancer     No current facility-administered medications on file prior to encounter.    Current Outpatient Medications on File Prior to Encounter  Medication Sig Dispense Refill   albuterol (PROVENTIL HFA;VENTOLIN HFA) 108 (90 Base) MCG/ACT inhaler Inhale 2 puffs into the lungs every 6 (six) hours as needed for wheezing or shortness of breath. 1 Inhaler 0   amiodarone (PACERONE) 200 MG tablet Take 1 tablet (200 mg) by mouth once daily 90 tablet 1   digoxin (LANOXIN) 0.125 MG tablet Take 0.0625 mg by mouth daily.      furosemide (LASIX) 20 MG tablet Take 40 mg by mouth daily.     lisinopril (PRINIVIL,ZESTRIL) 2.5 MG tablet TAKE 1 TABLET BY MOUTH  DAILY 90 tablet 3   metoprolol succinate (TOPROL-XL) 25 MG 24 hr tablet TAKE ONE-HALF TABLET BY  MOUTH DAILY 45 tablet 3   omeprazole (PRILOSEC) 20 MG capsule TAKE 1 CAPSULE BY MOUTH  DAILY 90 capsule 3   tamsulosin (FLOMAX) 0.4 MG CAPS capsule Take 1 capsule (0.4 mg total) by mouth daily. 30 capsule 3   warfarin (COUMADIN) 2.5 MG tablet Take 1 tablet (2.5 mg total) by mouth as directed. (Patient taking differently: Take  tablet (1.25MG ) by mouth Monday, Wednesday, Friday and take 1 tablet (2.5MG ) by mouth Tuesday, Thursday, Saturday and Sunday) 30 tablet 12   Bioflavonoid Products (BIOFLEX PO) Take 1 tablet by mouth daily.      polyethylene glycol powder (GLYCOLAX) powder Take 1 scoop by  mouth daily. 255 g 0   potassium chloride 20 MEQ TBCR Take 20 mEq by mouth daily. 60 tablet 1    Inpatient Medications: Scheduled Meds:  digoxin  0.0625 mg Oral Daily   docusate sodium  200 mg Oral BID   furosemide  40 mg Intravenous Daily   ipratropium-albuterol  3 mL Nebulization TID   metoprolol succinate  12.5 mg Oral Daily   multivitamin with minerals  1 tablet Oral Daily   pantoprazole  40 mg Oral Daily   polyethylene glycol  17 g Oral Daily   predniSONE  40 mg Oral Q breakfast   sodium chloride flush  3 mL Intravenous Q12H   sulfamethoxazole-trimethoprim  1 tablet Oral Daily   tamsulosin  0.4 mg Oral Daily   Warfarin - Pharmacist Dosing Inpatient   Does not apply q1800   Continuous Infusions:  sodium chloride Stopped (10/22/18 1845)   sodium chloride     PRN Meds: sodium chloride, acetaminophen, albuterol, benzonatate, guaiFENesin-dextromethorphan, nitroGLYCERIN, sodium chloride flush, traZODone  Allergies:    Allergies  Allergen Reactions   Atorvastatin  Simvastatin     Social History:   Social History   Socioeconomic History   Marital status: Married    Spouse name: Not on file   Number of children: 1   Years of education: Not on file   Highest education level: Not on file  Occupational History   Not on file  Social Needs   Financial resource strain: Not on file   Food insecurity:    Worry: Not on file    Inability: Not on file   Transportation needs:    Medical: Not on file    Non-medical: Not on file  Tobacco Use   Smoking status: Former Smoker    Last attempt to quit: 12/01/1987    Years since quitting: 30.9   Smokeless tobacco: Never Used  Substance and Sexual Activity   Alcohol use: No   Drug use: No   Sexual activity: Not Currently  Lifestyle   Physical activity:    Days per week: Not on file    Minutes per session: Not on file   Stress: Not on file  Relationships   Social connections:    Talks on  phone: Not on file    Gets together: Not on file    Attends religious service: Not on file    Active member of club or organization: Not on file    Attends meetings of clubs or organizations: Not on file    Relationship status: Not on file   Intimate partner violence:    Fear of current or ex partner: Not on file    Emotionally abused: Not on file    Physically abused: Not on file    Forced sexual activity: Not on file  Other Topics Concern   Not on file  Social History Narrative   Lives at home with his family.  Independent at baseline    Family History:    Family History  Problem Relation Age of Onset   Heart attack Father 7       Died w/ Heart attack   Diabetes Father    Heart attack Brother    Heart disease Brother    Diabetes Sister    Heart disease Sister      ROS:  Please see the history of present illness.   Review of Systems  Constitutional: Negative.        PNA admission  HENT: Negative.   Respiratory: Positive for cough and shortness of breath.        PNA  Cardiovascular: Positive for chest pain.  Gastrointestinal: Negative.   Musculoskeletal: Negative.   Neurological: Negative.  Negative for dizziness.  Psychiatric/Behavioral: Negative.   All other systems reviewed and are negative.     Physical Exam/Data:   Vitals:   10/25/18 1056 10/25/18 1102 10/25/18 1445 10/25/18 1632  BP: (!) 111/56   103/62  Pulse: 62 66 65 (!) 53  Resp: 20  18   Temp: 97.8 F (36.6 C)   97.8 F (36.6 C)  TempSrc: Oral   Oral  SpO2:  90% (!) 88% 94%  Weight:      Height:        Intake/Output Summary (Last 24 hours) at 10/25/2018 1847 Last data filed at 10/25/2018 1320 Gross per 24 hour  Intake --  Output 850 ml  Net -850 ml   Filed Weights   10/23/18 0323 10/24/18 0436 10/25/18 0557  Weight: 79.8 kg 78.8 kg 78.2 kg   Body mass index is 22.13 kg/m.  General:  Well nourished, well developed, in no acute distress but eager to go home HEENT:  normal Neck: no JVD noted Vascular: No carotid bruits; FA pulses 2+ bilaterally without bruits  Cardiac: Irregularly irregular no murmur on exam Lungs: Diffuse Rales, some rhonchi at the bases bilaterally Abd: soft, nontender, no hepatomegaly  Ext: no LEE  Musculoskeletal:  No deformities, BUE and BLE strength normal and equal Skin: warm and dry  Neuro:  CNs 2-12 intact, no focal abnormalities noted Psych:  Normal affect   EKG: Shows atrial fibrillation left bundle branch block ventricular rate 74 bpm, frequent PVCs Telemetry:  Telemetry was personally reviewed and demonstrates:  Afib, run of nonsustained VT 11 beats, PVCs  CV Studies:   Relevant CV Studies: 12/05/2017 TTE Study Conclusions - Left ventricle: The cavity size was mildly dilated. Systolic   function was severely reduced. The estimated ejection fraction   was in the range of 25% to 30%. Diffuse hypokinesis. Akinesis of   the apical and peri-apical myocardium. The study is not   technically sufficient to allow evaluation of LV diastolic   function. - Mitral valve: There was moderate regurgitation. - Left atrium: The atrium was moderately to severely dilated. - Right ventricle: The cavity size was mildly dilated. Wall   thickness was normal. Systolic function was mildly to moderately   reduced. - Right atrium: The atrium was moderately dilated. - Tricuspid valve: There was moderate regurgitation directed   eccentrically. - Pulmonary arteries: Systolic pressure was moderate to severely   elevated. PA peak pressure: 61 mm Hg (S).  Laboratory Data:  Chemistry Recent Labs  Lab 10/23/18 0326 10/24/18 0331 10/25/18 0543  NA 133* 135 134*  K 4.3 4.4 4.7  CL 102 102 99  CO2 21* 23 28  GLUCOSE 205* 166* 103*  BUN 25* 33* 36*  CREATININE 1.14 1.01 1.04  CALCIUM 8.3* 8.5* 8.4*  GFRNONAA 56* >60 >60  GFRAA >60 >60 >60  ANIONGAP 10 10 7     Recent Labs  Lab 10/20/18 1840  PROT 7.0  ALBUMIN 2.8*  AST 30  ALT  20  ALKPHOS 92  BILITOT 0.4   Hematology Recent Labs  Lab 10/23/18 0326 10/24/18 0331 10/25/18 0543  WBC 7.7 14.7* 11.0*  RBC 4.15* 4.06* 4.20*  HGB 12.4* 12.1* 12.6*  HCT 37.6* 36.6* 38.2*  MCV 90.6 90.1 91.0  MCH 29.9 29.8 30.0  MCHC 33.0 33.1 33.0  RDW 14.1 14.1 14.3  PLT 192 206 194   Cardiac Enzymes Recent Labs  Lab 10/20/18 1836 10/22/18 0300 10/24/18 1759  TROPONINI <0.03 <0.03 <0.03   No results for input(s): TROPIPOC in the last 168 hours.  BNP Recent Labs  Lab 10/20/18 1837  BNP 394.0*    DDimer No results for input(s): DDIMER in the last 168 hours.  Radiology/Studies:  Ct Chest Wo Contrast  Result Date: 10/23/2018 CLINICAL DATA:  Cough, shortness of breath. EXAM: CT CHEST WITHOUT CONTRAST TECHNIQUE: Multidetector CT imaging of the chest was performed following the standard protocol without IV contrast. COMPARISON:  Radiograph of October 22, 2018. CT scan of July 15, 2018. FINDINGS: Cardiovascular: Atherosclerosis of thoracic aorta is noted without aneurysm formation. Normal cardiac size. Coronary artery calcifications are noted. No pericardial effusion. Mediastinum/Nodes: Thyroid gland and esophagus are unremarkable. Mildly enlarged mediastinal adenopathy is noted which most likely is reactive or inflammatory in etiology. Lungs/Pleura: No pneumothorax or pleural effusion is noted. Emphysematous disease is noted in both lungs. Bronchiectasis is noted in both lower lobes. There is  interval development of diffuse reticular and interstitial densities throughout both lungs concerning for interstitial or atypical pneumonia or possibly edema. Upper Abdomen: No acute abnormality. Musculoskeletal: No chest wall mass or suspicious bone lesions identified. IMPRESSION: Interval development of diffuse and interstitial densities throughout both lungs concerning for interstitial or atypical pneumonia or possibly edema. Bronchiectasis is noted in both lower lobes. Mildly enlarged  mediastinal adenopathy is noted which most likely is reactive or inflammatory in etiology. Coronary artery calcifications are noted suggesting coronary artery disease. Aortic Atherosclerosis (ICD10-I70.0) and Emphysema (ICD10-J43.9). Electronically Signed   By: Marijo Conception M.D.   On: 10/23/2018 10:15   Dg Chest Port 1 View  Result Date: 10/22/2018 CLINICAL DATA:  Shortness of breath. EXAM: PORTABLE CHEST 1 VIEW COMPARISON:  Chest x-ray dated October 20, 2018. FINDINGS: Stable cardiomediastinal silhouette. Bilateral diffuse interstitial and alveolar airspace opacities with peripheral predominance again noted, slightly worsened in both upper lobes. No pleural effusion or pneumothorax. No acute osseous abnormality. IMPRESSION: 1. Peripheral predominant bilateral interstitial and alveolar airspace disease, worsened in both upper lobes. Electronically Signed   By: Titus Dubin M.D.   On: 10/22/2018 19:41    Assessment and Plan:   Acute respiratory distress Long history of chronic lung disease with progression over the past 10 years Emphysema on CT scans January 2020 Pneumonia back in early 2020 Now with worsening respiratory status, on oxygen 4 to 5 L with hypoxia on exertion -Amiodarone has been held for concern of amiodarone toxicity -We will change him back to oral Lasix given climbing BUN from 21 up to 36 over the past 4 days He is now 4 L negative or more, concern for prerenal state He takes Lasix 40 orally, will start tomorrow  Permanent Afib  -CHA2DS2VASc score of at least 5 (agex2, vascular, HFrEF, HTN)  Maintained on warfarin INR elevated 4.1  HFrEF complicated by moderate to severe MR -Followed by Dr. Caryl Comes  Continue outpatient medications He has previously declined mitral clip/intervention on his mitral valve  CAD with stable angina Reports having episode of chest pain yesterday Received nitroglycerin, no further episodes Troponin negative Poor candidate at this time for  ischemic work-up  Nonsustained VT Amiodarone has been held On low-dose digoxin Also on metoprolol succinate 12.5 mg daily Limited room to advance medications given bradycardia and low blood pressure If he has frequent long runs of nonsustained VT, we would need to contact EP on call through the weekend for further discussion.  Options are more limited given his condition He has been asymptomatic   Long discussion with him concerning recent events over the past several months, case discussed with hospitalist service Total encounter time more than 110 minutes  Greater than 50% was spent in counseling and coordination of care with the patient   For questions or updates, please contact [place] Please consult [email or web address] for contact info under     Signed, Ida Rogue, MD  10/25/2018 6:47 PM

## 2018-10-25 NOTE — Progress Notes (Signed)
Pulmonary Medicine          Date: 10/25/2018,   MRN# [id number] DANNER PAULDING [birth year]     AdmissionWeight: 83 kg                 CurrentWeight: 78.2 kg      SUBJECTIVE   Patient states he feels better, doing incentive spirometry well currently only at 300cc,  Still requiring 5L/min O2 therapy   PAST MEDICAL HISTORY   Past Medical History:  Diagnosis Date   Abdominal aortic aneurysm ([place]) 01-2004   4.7 x 4.7 cm.   CAD (coronary artery disease) 01/25/2004   a. 01/2004 Ant MI/DES to LAD;  b. 10/2011 Neg MV.   CHF (congestive heart failure) (H[facility])    Chronic systolic heart failure ([place])    a. 08/2016 Echo: EF 35%, nl RV fxn, mod to sev MR, mild to mod TR, mod biatrial enlargement.   Hyperlipidemia    Hypertension    Ischemic cardiomyopathy    a. 08/2016 Echo: EF 35%.   Moderate to Severe Mitral regurgitation    a. 08/2016 Echo: Mod-sev MR.   Permanent atrial fibrillation    a. Chronic coumadin (CHA2DS2VASc = 5).   Pulmonary nodules    Noted on abdominal CT   Statin intolerance      SURGICAL HISTORY   Past Surgical History:  Procedure Laterality Date   ABDOMINAL AORTIC ANEURYSM REPAIR     12/02/2007 [facility]   ABDOMINAL AORTIC ANEURYSM REPAIR  2006   [facility]    CARDIAC CATHETERIZATION  2009   [facility]   CORONARY ANGIOPLASTY WITH STENT PLACEMENT  2005   Cypher stent LAD    Cypher stents to LAD     01/25/2004   SKIN SURGERY  2016   [facility] skin cancer     FAMILY HISTORY   Family History  Problem Relation Age of Onset   Heart attack Father 54       Died w/ Heart attack   Diabetes Father    Heart attack Brother    Heart disease Brother    Diabetes Sister    Heart disease Sister      SOCIAL HISTORY   Social History   Tobacco Use   Smoking status: Former Smoker    Last attempt to quit: 12/01/1987    Years since quitting: 30.9   Smokeless tobacco: Never Used  Substance Use Topics   Alcohol use: No   Drug use: No       MEDICATIONS    Home Medication:    Current Medication:  Current Facility-Administered Medications:    0.9 %  sodium chloride infusion, 250 mL, Intravenous, PRN, Pyreddy, Pavan, MD, Stopped at 10/22/18 1845   0.9 %  sodium chloride infusion, , Intravenous, Continuous, Ojie, Jude, MD   acetaminophen (TYLENOL) tablet 650 mg, 650 mg, Oral, Q6H PRN, Ojie, Jude, MD, 650 mg at 10/24/18 2014   albuterol (PROVENTIL) (2.5 MG/3ML) 0.083% nebulizer solution 2.5 mg, 2.5 mg, Inhalation, Q6H PRN, Pyreddy, Pavan, MD, 2.5 mg at 10/22/18 0240   benzonatate (TESSALON) capsule 100 mg, 100 mg, Oral, TID PRN, Stark Jock, Jude, MD, 100 mg at 10/22/18 9983   digoxin (LANOXIN) tablet 0.0625 mg, 0.0625 mg, Oral, Daily, Pyreddy, Pavan, MD, 0.0625 mg at 10/24/18 0925   docusate sodium (COLACE) capsule 200 mg, 200 mg, Oral, BID, Harrie Foreman, MD, 200 mg at 10/24/18 2209   furosemide (LASIX) injection 40 mg, 40 mg, Intravenous, Daily, Stark Jock, Jude, MD  guaiFENesin-dextromethorphan (ROBITUSSIN DM) 100-10 MG/5ML syrup 5 mL, 5 mL, Oral, Q4H PRN, Ojie, Jude, MD, 5 mL at 10/22/18 1609   ipratropium-albuterol (DUONEB) 0.5-2.5 (3) MG/3ML nebulizer solution 3 mL, 3 mL, Nebulization, TID, Ojie, Jude, MD, 3 mL at 10/25/18 0730   metoprolol succinate (TOPROL-XL) 24 hr tablet 12.5 mg, 12.5 mg, Oral, Daily, Pyreddy, Pavan, MD, 12.5 mg at 10/24/18 0932   multivitamin with minerals tablet 1 tablet, 1 tablet, Oral, Daily, Ojie, Jude, MD, 1 tablet at 10/24/18 6712   nitroGLYCERIN (NITROSTAT) SL tablet 0.4 mg, 0.4 mg, Sublingual, Q5 min PRN, Ojie, Jude, MD   pantoprazole (PROTONIX) EC tablet 40 mg, 40 mg, Oral, Daily, Pyreddy, Pavan, MD, 40 mg at 10/24/18 0925   polyethylene glycol (MIRALAX / GLYCOLAX) packet 17 g, 17 g, Oral, Daily, Harrie Foreman, MD, 17 g at 10/24/18 4580   predniSONE (DELTASONE) tablet 40 mg, 40 mg, Oral, Q breakfast, Ottie Glazier, MD, 40 mg at 10/24/18 9983   sodium chloride flush (NS)  0.9 % injection 3 mL, 3 mL, Intravenous, Q12H, Pyreddy, Pavan, MD, 3 mL at 10/24/18 2209   sodium chloride flush (NS) 0.9 % injection 3 mL, 3 mL, Intravenous, PRN, Pyreddy, Reatha Harps, MD, 3 mL at 10/24/18 0641   tamsulosin (FLOMAX) capsule 0.4 mg, 0.4 mg, Oral, Daily, Pyreddy, Pavan, MD, 0.4 mg at 10/24/18 3825   traZODone (DESYREL) tablet 50 mg, 50 mg, Oral, QHS PRN, Mayo, Pete Pelt, MD, 50 mg at 10/24/18 2209   Warfarin - Pharmacist Dosing Inpatient, , Does not apply, q1800, Ojie, Jude, MD    ALLERGIES   Atorvastatin and Simvastatin     REVIEW OF SYSTEMS    Review of Systems:  Gen:  Denies  fever, sweats, chills weigh loss  HEENT: Denies blurred vision, double vision, ear pain, eye pain, hearing loss, nose bleeds, sore throat Cardiac:  No dizziness, chest pain or heaviness, chest tightness,edema Resp:   Denies cough or sputum porduction, shortness of breath,wheezing, hemoptysis,  Gi: Denies swallowing difficulty, stomach pain, nausea or vomiting, diarrhea, constipation, bowel incontinence Gu:  Denies bladder incontinence, burning urine Ext:   Denies Joint pain, stiffness or swelling Skin: Denies  skin rash, easy bruising or bleeding or hives Endoc:  Denies polyuria, polydipsia , polyphagia or weight change Psych:   Denies depression, insomnia or hallucinations   Other:  All other systems negative   VS: BP (!) 96/54 (BP Location: Left Arm)    Pulse 62    Temp 97.7 F (36.5 C) (Oral)    Resp 18    Ht 6\' 2"  (1.88 m)    Wt 78.2 kg    SpO2 (!) 88%    BMI 22.13 kg/m      PHYSICAL EXAM    GENERAL:NAD, no fevers, chills, no weakness no fatigue HEAD: Normocephalic, atraumatic.  EYES: Pupils equal, round, reactive to light. Extraocular muscles intact. No scleral icterus.  MOUTH: Moist mucosal membrane. Dentition intact. No abscess noted.  EAR, NOSE, THROAT: Clear without exudates. No external lesions.  NECK: Supple. No thyromegaly. No nodules. No JVD.  PULMONARY: Mild  bibasilar velcro crepitation without wheezing or rhonchi CARDIOVASCULAR: S1 and S2. Regular rate and rhythm. No murmurs, rubs, or gallops. No edema. Pedal pulses 2+ bilaterally.  GASTROINTESTINAL: Soft, nontender, nondistended. No masses. Positive bowel sounds. No hepatosplenomegaly.  MUSCULOSKELETAL: No swelling, clubbing, or edema. Range of motion full in all extremities.  NEUROLOGIC: Cranial nerves II through XII are intact. No gross focal neurological deficits. Sensation intact. Reflexes intact.  SKIN: No ulceration, lesions, rashes, or cyanosis. Skin warm and dry. Turgor intact.  PSYCHIATRIC: Mood, affect within normal limits. The patient is awake, alert and oriented x 3. Insight, judgment intact.       IMAGING    Ct Chest Wo Contrast  Result Date: 10/23/2018 CLINICAL DATA:  Cough, shortness of breath. EXAM: CT CHEST WITHOUT CONTRAST TECHNIQUE: Multidetector CT imaging of the chest was performed following the standard protocol without IV contrast. COMPARISON:  Radiograph of October 22, 2018. CT scan of July 15, 2018. FINDINGS: Cardiovascular: Atherosclerosis of thoracic aorta is noted without aneurysm formation. Normal cardiac size. Coronary artery calcifications are noted. No pericardial effusion. Mediastinum/Nodes: Thyroid gland and esophagus are unremarkable. Mildly enlarged mediastinal adenopathy is noted which most likely is reactive or inflammatory in etiology. Lungs/Pleura: No pneumothorax or pleural effusion is noted. Emphysematous disease is noted in both lungs. Bronchiectasis is noted in both lower lobes. There is interval development of diffuse reticular and interstitial densities throughout both lungs concerning for interstitial or atypical pneumonia or possibly edema. Upper Abdomen: No acute abnormality. Musculoskeletal: No chest wall mass or suspicious bone lesions identified. IMPRESSION: Interval development of diffuse and interstitial densities throughout both lungs concerning  for interstitial or atypical pneumonia or possibly edema. Bronchiectasis is noted in both lower lobes. Mildly enlarged mediastinal adenopathy is noted which most likely is reactive or inflammatory in etiology. Coronary artery calcifications are noted suggesting coronary artery disease. Aortic Atherosclerosis (ICD10-I70.0) and Emphysema (ICD10-J43.9). Electronically Signed   By: Marijo Conception M.D.   On: 10/23/2018 10:15   Dg Chest Port 1 View  Result Date: 10/22/2018 CLINICAL DATA:  Shortness of breath. EXAM: PORTABLE CHEST 1 VIEW COMPARISON:  Chest x-ray dated October 20, 2018. FINDINGS: Stable cardiomediastinal silhouette. Bilateral diffuse interstitial and alveolar airspace opacities with peripheral predominance again noted, slightly worsened in both upper lobes. No pleural effusion or pneumothorax. No acute osseous abnormality. IMPRESSION: 1. Peripheral predominant bilateral interstitial and alveolar airspace disease, worsened in both upper lobes. Electronically Signed   By: Titus Dubin M.D.   On: 10/22/2018 19:41   Dg Chest Portable 1 View  Result Date: 10/20/2018 CLINICAL DATA:  Shortness of breath, hypoxia EXAM: PORTABLE CHEST 1 VIEW COMPARISON:  09/24/2018 FINDINGS: Bilateral diffuse interstitial and alveolar airspace opacities with a peripheral predominance. No pleural effusion or pneumothorax. Stable cardiomediastinal silhouette. No aggressive osseous lesion. IMPRESSION: 1. Chronic interstitial lung disease. 2. Increased bilateral interstitial and alveolar airspace opacities with a peripheral predominance. Differential considerations include multilobar pneumonia including atypical infection such as viral pneumonia. 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 Delaine Lame -will start prednisone 40 mg p.o. daily plan for 2 months slow taper -need to stop amiodarone and initiate alternative antiarrhythmic - home with O2 at least for short term  - pulmonary clinic follow up will set up post d/c           -Atelectasis - please provide patient with Acapella flutter valve and IS to take home with him. Chest PT today.                 -please encourage patient to use every hour several times.     Thank you for allowing me to participate in the care of this patient.  Patient/Family are satisfied with care plan and all questions have been answered.  This document was prepared using Dragon voice recognition software and may include unintentional dictation errors.     Ottie Glazier, M.D.  Division of [facility]

## 2018-10-25 NOTE — Consult Note (Signed)
[facility] for Warfarin Indication: atrial fibrillation  Allergies  Allergen Reactions  . Atorvastatin   . Simvastatin     Patient Measurements: Height: 6\' 2"  (188 cm) Weight: 172 lb 6.4 oz (78.2 kg) IBW/kg (Calculated) : 82.2  Vital Signs: Temp: 97.7 F (36.5 C) (04/24 0408) Temp Source: Oral (04/24 0408) BP: 96/54 (04/24 0408) Pulse Rate: 62 (04/24 0736)  Labs: Recent Labs    10/23/18 0326 10/24/18 0331 10/24/18 1759 10/25/18 0543  HGB 12.4* 12.1*  --  12.6*  HCT 37.6* 36.6*  --  38.2*  PLT 192 206  --  194  LABPROT 27.8* 32.7*  --  39.0*  INR 2.6* 3.3*  --  4.1*  CREATININE 1.14 1.01  --  1.04  TROPONINI  --   --  <0.03  --     Estimated Creatinine Clearance: 52.2 mL/min (by C-G formula based on SCr of 1.04 mg/dL).   Medical History: Past Medical History:  Diagnosis Date  . Abdominal aortic aneurysm ([place]) 01-2004   4.7 x 4.7 cm.  Marland Kitchen CAD (coronary artery disease) 01/25/2004   a. 01/2004 Ant MI/DES to LAD;  b. 10/2011 Neg MV.  . CHF (congestive heart failure) ([place])   . Chronic systolic heart failure ([place])    a. 08/2016 Echo: EF 35%, nl RV fxn, mod to sev MR, mild to mod TR, mod biatrial enlargement.  . Hyperlipidemia   . Hypertension   . Ischemic cardiomyopathy    a. 08/2016 Echo: EF 35%.  . Moderate to Severe Mitral regurgitation    a. 08/2016 Echo: Mod-sev MR.  Marland Kitchen Permanent atrial fibrillation    a. Chronic coumadin (CHA2DS2VASc = 5).  . Pulmonary nodules    Noted on abdominal CT  . Statin intolerance     Medications:  Medications Prior to Admission  Medication Sig Dispense Refill Last Dose  . albuterol (PROVENTIL HFA;VENTOLIN HFA) 108 (90 Base) MCG/ACT inhaler Inhale 2 puffs into the lungs every 6 (six) hours as needed for wheezing or shortness of breath. 1 Inhaler 0 10/20/2018 at Unknown time  . amiodarone (PACERONE) 200 MG tablet Take 1 tablet (200 mg) by mouth once daily 90 tablet 1 10/20/2018 at 0900  . digoxin  (LANOXIN) 0.125 MG tablet Take 0.0625 mg by mouth daily.    10/20/2018 at 0900  . furosemide (LASIX) 20 MG tablet Take 40 mg by mouth daily.   10/20/2018 at 0900  . lisinopril (PRINIVIL,ZESTRIL) 2.5 MG tablet TAKE 1 TABLET BY MOUTH  DAILY 90 tablet 3 10/20/2018 at 0900  . metoprolol succinate (TOPROL-XL) 25 MG 24 hr tablet TAKE ONE-HALF TABLET BY  MOUTH DAILY 45 tablet 3 10/20/2018 at 0900  . omeprazole (PRILOSEC) 20 MG capsule TAKE 1 CAPSULE BY MOUTH  DAILY 90 capsule 3 10/20/2018 at 0900  . tamsulosin (FLOMAX) 0.4 MG CAPS capsule Take 1 capsule (0.4 mg total) by mouth daily. 30 capsule 3 10/19/2018 at 1800  . warfarin (COUMADIN) 2.5 MG tablet Take 1 tablet (2.5 mg total) by mouth as directed. (Patient taking differently: Take  tablet (1.25MG ) by mouth Monday, Wednesday, Friday and take 1 tablet (2.5MG ) by mouth Tuesday, Thursday, Saturday and Sunday) 30 tablet 12 10/19/2018 at 1800  . Bioflavonoid Products (BIOFLEX PO) Take 1 tablet by mouth daily.    Taking  . polyethylene glycol powder (GLYCOLAX) powder Take 1 scoop by mouth daily. 255 g 0 prn at prn  . potassium chloride 20 MEQ TBCR Take 20 mEq by mouth daily.  60 tablet 1 Taking   Scheduled:  . digoxin  0.0625 mg Oral Daily  . docusate sodium  200 mg Oral BID  . furosemide  40 mg Intravenous Daily  . ipratropium-albuterol  3 mL Nebulization TID  . metoprolol succinate  12.5 mg Oral Daily  . multivitamin with minerals  1 tablet Oral Daily  . pantoprazole  40 mg Oral Daily  . polyethylene glycol  17 g Oral Daily  . predniSONE  40 mg Oral Q breakfast  . sodium chloride flush  3 mL Intravenous Q12H  . tamsulosin  0.4 mg Oral Daily  . Warfarin - Pharmacist Dosing Inpatient   Does not apply q1800   Infusions:  . sodium chloride Stopped (10/22/18 1845)  . sodium chloride     PRN: sodium chloride, acetaminophen, albuterol, benzonatate, guaiFENesin-dextromethorphan, nitroGLYCERIN, sodium chloride flush, traZODone Anti-infectives (From admission,  onward)   Start     Dose/Rate Route Frequency Ordered Stop   10/23/18 1000  vancomycin (VANCOCIN) 1,250 mg in sodium chloride 0.9 % 250 mL IVPB  Status:  Discontinued     1,250 mg 166.7 mL/hr over 90 Minutes Intravenous Every 24 hours 10/23/18 0943 10/23/18 1229   10/22/18 1800  ceFEPIme (MAXIPIME) 2 g in sodium chloride 0.9 % 100 mL IVPB  Status:  Discontinued     2 g 200 mL/hr over 30 Minutes Intravenous Every 12 hours 10/22/18 0505 10/23/18 1243   10/22/18 0700  vancomycin (VANCOCIN) 1,750 mg in sodium chloride 0.9 % 500 mL IVPB  Status:  Discontinued     1,750 mg 250 mL/hr over 120 Minutes Intravenous Every 36 hours 10/20/18 2030 10/22/18 0912   10/21/18 0700  ceFEPIme (MAXIPIME) 2 g in sodium chloride 0.9 % 100 mL IVPB  Status:  Discontinued     2 g 200 mL/hr over 30 Minutes Intravenous Every 12 hours 10/20/18 2118 10/22/18 0505   10/20/18 2030  vancomycin (VANCOCIN) IVPB 1000 mg/200 mL premix     1,000 mg 200 mL/hr over 60 Minutes Intravenous  Once 10/20/18 2020 10/21/18 0055   10/20/18 1915  ceFEPIme (MAXIPIME) 1 g in sodium chloride 0.9 % 100 mL IVPB     1 g 200 mL/hr over 30 Minutes Intravenous  Once 10/20/18 1906 10/20/18 1958   10/20/18 1915  vancomycin (VANCOCIN) IVPB 1000 mg/200 mL premix     1,000 mg 200 mL/hr over 60 Minutes Intravenous  Once 10/20/18 1906 10/20/18 2025      Assessment: Pharmacy was consulted to start dosing warfarin. INR increased from 2.6 to 3.3 on 4/23. Patient's home dose is 1.25 mg M-W-F and 2.5 mg T-Th-Sat-Sun. DDI: APAP- inc risk of bleeding, digoxin, amio increase INR   Date INR Warfarin Dose  4/22 2.6 1.25 mg  4/23 3.3 Hold  4/24 4.1 Hold    Goal of Therapy:  INR 2-3 Monitor platelets by anticoagulation protocol: Yes   Plan:  INR is supratherapeutic. Will hold warfarin dose tonight. Amiodarone was d/c'ed due to toxicity. INR is continuing to trend up. No bleeding noted, pt does not need vitamin K at this point. If INR continues to  increase tomorrow, may consider PO vitamin K. Daily INR ordered. CBC stable. Monitor for any sign or symptoms of bleeding. Cefepime was d/c'ed 4/22.   Oswald Hillock, PharmD, BCPS  Clinical Pharmacist  10/25/2018,8:15 AM

## 2018-10-25 NOTE — Progress Notes (Signed)
CCMD called RN regarding 11 beat run of VTach.  Pt is asymptomatic, and talking on the telephone.  Pulse and heart sounds WDL.  Denies N/V, SOB, CP.

## 2018-10-25 NOTE — Plan of Care (Signed)
  Problem: Clinical Measurements: Goal: Respiratory complications will improve Outcome: Progressing   Problem: Activity: Goal: Ability to tolerate increased activity will improve Outcome: Progressing   Problem: Respiratory: Goal: Ability to maintain adequate ventilation will improve Outcome: Progressing

## 2018-10-25 NOTE — Care Management Important Message (Signed)
Important Message  Patient Details  Name: Joseph Barton MRN: [id number] Date of Birth: Feb 12, 1927   Medicare Important Message Given:  Yes    Dannette Barbara 10/25/2018, 10:52 AM

## 2018-10-26 ENCOUNTER — Inpatient Hospital Stay: Payer: Medicare Other

## 2018-10-26 LAB — CBC
HCT: 39.1 % (ref 39.0–52.0)
Hemoglobin: 12.9 g/dL — ABNORMAL LOW (ref 13.0–17.0)
MCH: 30.4 pg (ref 26.0–34.0)
MCHC: 33 g/dL (ref 30.0–36.0)
MCV: 92 fL (ref 80.0–100.0)
Platelets: 190 10*3/uL (ref 150–400)
RBC: 4.25 MIL/uL (ref 4.22–5.81)
RDW: 14.3 % (ref 11.5–15.5)
WBC: 10 10*3/uL (ref 4.0–10.5)
nRBC: 0 % (ref 0.0–0.2)

## 2018-10-26 LAB — BASIC METABOLIC PANEL
Anion gap: 8 (ref 5–15)
BUN: 35 mg/dL — ABNORMAL HIGH (ref 8–23)
CO2: 27 mmol/L (ref 22–32)
Calcium: 8.4 mg/dL — ABNORMAL LOW (ref 8.9–10.3)
Chloride: 100 mmol/L (ref 98–111)
Creatinine, Ser: 1.03 mg/dL (ref 0.61–1.24)
GFR calc Af Amer: >60 mL/min (ref >60)
GFR calc non Af Amer: >60 mL/min (ref >60)
Glucose, Bld: 101 mg/dL — ABNORMAL HIGH (ref 70–99)
Potassium: 4.3 mmol/L (ref 3.5–5.1)
Sodium: 135 mmol/L (ref 135–145)

## 2018-10-26 LAB — PROTIME-INR
INR: 3.2 — ABNORMAL HIGH (ref 0.8–1.2)
Prothrombin Time: 32.1 seconds — ABNORMAL HIGH (ref 11.4–15.2)

## 2018-10-26 LAB — BRAIN NATRIURETIC PEPTIDE: B Natriuretic Peptide: 354 pg/mL — ABNORMAL HIGH (ref 0.0–100.0)

## 2018-10-26 LAB — MAGNESIUM: Magnesium: 2.2 mg/dL (ref 1.7–2.4)

## 2018-10-26 LAB — SEDIMENTATION RATE: Sed Rate: 51 mm/hr — ABNORMAL HIGH (ref 0–20)

## 2018-10-26 MED ORDER — FUROSEMIDE 40 MG PO TABS
40.0000 mg | ORAL_TABLET | Freq: Every day | ORAL | Status: DC
  Administered 2018-10-26 – 2018-11-01 (×7): 40 mg via ORAL
  Filled 2018-10-26 (×7): qty 1

## 2018-10-26 MED ORDER — FUROSEMIDE 10 MG/ML IJ SOLN
40.0000 mg | Freq: Once | INTRAMUSCULAR | Status: AC
  Administered 2018-10-26: 40 mg via INTRAVENOUS

## 2018-10-26 MED ORDER — METOPROLOL SUCCINATE ER 25 MG PO TB24
25.0000 mg | ORAL_TABLET | Freq: Every day | ORAL | Status: DC
  Administered 2018-10-29 – 2018-10-30 (×2): 25 mg via ORAL
  Filled 2018-10-26 (×5): qty 1

## 2018-10-26 NOTE — Progress Notes (Signed)
[name]    MR#:  [id number]  DATE OF BIRTH:  1927-04-04  SUBJECTIVE:  CHIEF COMPLAINT:   Chief Complaint  Patient presents with  . Weakness   Patient had an episode of chest discomfort yesterday which is resolved.  Troponin was negative.  Twelve-lead EKG unremarkable.  Rested better last night.  No fevers overnight. Patient still requiring 4-5 L of oxygen via nasal cannula this morning  REVIEW OF SYSTEMS:  Review of Systems  Constitutional: Negative for chills and fever.  HENT: Negative for hearing loss and tinnitus.   Eyes: Negative for blurred vision and double vision.  Respiratory: Positive for cough and shortness of breath.        Shortness of breath improving  Cardiovascular: Negative for chest pain and palpitations.  Gastrointestinal: Negative for abdominal pain, heartburn, nausea and vomiting.  Genitourinary: Negative for dysuria and urgency.  Musculoskeletal: Negative for myalgias and neck pain.  Skin: Negative for itching and rash.  Neurological: Negative for dizziness and headaches.  Psychiatric/Behavioral: Negative for depression and hallucinations.    DRUG ALLERGIES:   Allergies  Allergen Reactions  . Atorvastatin   . Simvastatin    VITALS:  Blood pressure (!) 102/59, pulse 63, temperature (!) 97.5 F (36.4 C), temperature source Oral, resp. rate 19, height 6\' 2"  (1.88 m), weight 76.5 kg, SpO2 95 %. PHYSICAL EXAMINATION:   Physical Exam  Constitutional: He is oriented to person, place, and time. He appears well-developed and well-nourished.  HENT:  Head: Normocephalic and atraumatic.  Right Ear: External ear normal.  Eyes: Pupils are equal, round, and reactive to light. Conjunctivae are normal. Right eye exhibits no discharge. No scleral icterus.  Neck: Normal range of motion. Neck supple. No tracheal deviation present.  Cardiovascular:  Irregularly irregular  Respiratory: Effort  normal. He has rales.  GI: Soft. Bowel sounds are normal. There is no abdominal tenderness.  Musculoskeletal: Normal range of motion.        General: No edema.     Comments: Chronic venous stasis changes to both lower extremity.  Neurological: He is alert and oriented to person, place, and time. No cranial nerve deficit.  Skin: Skin is warm. He is not diaphoretic. No erythema.  Psychiatric: He has a normal mood and affect. His behavior is normal.   LABORATORY PANEL:  Male CBC Recent Labs  Lab 10/26/18 0603  WBC 10.0  HGB 12.9*  HCT 39.1  PLT 190   ------------------------------------------------------------------------------------------------------------------ Chemistries  Recent Labs  Lab 10/20/18 1840  10/26/18 0603  NA 135   < > 135  K 4.4   < > 4.3  CL 103   < > 100  CO2 23   < > 27  GLUCOSE 119*   < > 101*  BUN 27*   < > 35*  CREATININE 1.24   < > 1.03  CALCIUM 7.9*   < > 8.4*  MG  --    < > 2.2  AST 30  --   --   ALT 20  --   --   ALKPHOS 92  --   --   BILITOT 0.4  --   --    < > = values in this interval not displayed.   RADIOLOGY:  Dg Chest Port 1 View  Result Date: 10/26/2018 CLINICAL DATA:  Increased shortness of breath. EXAM: PORTABLE CHEST 1 VIEW COMPARISON:  10/22/2018. FINDINGS: Cardiomegaly. Diffuse BILATERAL interstitial and alveolar  lung disease not significantly changed from priors. No pneumothorax or effusion. Thoracic atherosclerosis. Bones unremarkable. IMPRESSION: Stable chest. Electronically Signed   By: Staci Righter M.D.   On: 10/26/2018 07:01   ASSESSMENT AND PLAN:  85 year old male patient with a known history of pneumonia in the past, chronic systolic heart failure with EF of 25-30%, coronary artery disease, abdominal aortic aneurysm, hyperlipidemia, hypertension, mitral regurgitation, atrial fibrillation on Coumadin presented to emergency room for shortness of breath and cough.   1.Acute respiratory failure with hypoxia Initially felt to  be secondary to pneumonia.  Patient evaluated by infectious disease.  Patient was not responding to antibiotics.  Antibiotics already discontinued since no clinical evidence to support pneumonia. Pneumonia ruled out. Recent CT chest without contrast done for further evaluation revealed interval development of diffuse and interstitial densities throughout both lungs concerning for interstitial or atypical pneumonia or possibly edema. Bronchiectasis is noted in both lower lobes. 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  Patient has been on amiodarone since January 2020.  Appreciate input from infectious disease specialist and pulmonologist. Amiodarone already discontinued.   Patient started on prednisone 40 mg p.o. daily with plans for 2 months slow taper.   Infectious disease specialist recommended Bactrim SS 1 tablet daily for PCP prophylaxis since patient is going to be on prednisone for more than 30 days which has been ordered Gradually weaning down oxygen requirement.  Patient was still requiring 4-5 L this morning.  We will continue to wean down as tolerated. Patient will most likely require home oxygen therapy on discharge.  Patient to follow-up with pulmonary clinic post discharge. Patient will also need Acapella flutter valve and incentive spirometer to take home with him on discharge. [id number] test was negative as such COVID infection ruled out. Pulmonary embolism very unlikely since patient already on anticoagulation with INR supratherapeutic at 4.1  2.Multilobar pneumonia ruled out Has been ruled out.  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  So far no growth on blood cultures. Coronavirus test negative.  COVID 19 ruled out  3.Chronic atrial fibrillation Patient on Coumadin with INR supratherapeutic at 4.1.   Pharmacy assisting with dosing with goal to resume once INR less than 3 Amiodarone discontinued during this admission due to pulmonary toxicity.   cardiologist Dr.  Vilinda Blanks small dose of digoxin and metoprolol for rate control.   Electrophysiologist upon discharge   4. Hypertension Blood pressure controlled on current regimen  Lisinopril discontinued recently due to borderline low blood pressures  5.  Chronic systolic CHF Ejection fraction of 25 to 30%.  Lasix was initially placed on hold on admission due to concern for dehydration. Due to worsening respiratory status, Lasix already resumed  Continue beta-blocker.  Lisinopril on hold due to blood pressure being borderline recently.. To resume in a.m. if blood pressure remains stable  DVT prophylaxis; pharmacist dosing Coumadin.  INR supratherapeutic at 3.2  I called patient's spouse listed in chart Ms. Santina Evans again today.  Updated her on patient's clinical condition.  All questions were answered and she is in agreement with the plan of care.  All the records are reviewed and case discussed with Care Management/Social Worker. Management plans discussed with the patient, and they are in agreement.  CODE STATUS: DNR  TOTAL TIME TAKING CARE OF THIS PATIENT: 33 minutes.   More than 50% of the time was spent in counseling/coordination  of care: YES  POSSIBLE D/C IN 2 DAYS, DEPENDING ON CLINICAL CONDITION.   Vaughan Basta M.D on 10/26/2018 at 5:24 PM  Between 7am to 6pm - Pager - [phone]  After 6pm go to [email or web address] - password [email or web address] [facility]  [facility]  Office  [phone]  CC: Primary care physician; Jerrol Banana., MD  Note: This dictation was prepared with Dragon dictation along with smaller phrase technology. Any transcriptional errors that result from this process are unintentional.

## 2018-10-26 NOTE — Progress Notes (Signed)
Physical Therapy Treatment Patient Details Name: Joseph Barton MRN: [id number] DOB: Mar 26, 1927 Today's Date: 10/26/2018    History of Present Illness  85 y.o. male with a known history of pneumonia in the past, chronic systolic heart failure with EF of 35%, coronary artery disease, abdominal aortic aneurysm, hyperlipidemia, hypertension, mitral regurgitation, atrial fibrillation on Coumadin presented to emergency room for shortness of breath and cough.   Has generalized weakness.  Recently treated for pneumonia (tested (-) for coronavirus)    PT Comments    Patient able to complete STS with modi with good RW safety carry over from last session and no decrease in O2. Patrient needs in cuing thorughtout ambulation for RW safety, which he is able to comply with well. PT continued cuing for PLB which patient is able to complete well in sitting, with increased difficulty during ambultion. Following ambulation patient demonstrates modI chair to [facility] transfer with min cuing for hand placement. Following toileting patient has a coughing spell and was encouraged by PT to eject his fleam, which he is able to do. Following session and return to bed patietn O2 into low 70s, respiratory applied high flow cannula and elevated O2 to 90%  Follow Up Recommendations  SNF     Equipment Recommendations  None recommended by PT    Recommendations for Other Services       Precautions / Restrictions      Mobility  Bed Mobility Overal bed mobility: Modified Independent             General bed mobility comments: Patient able complete physically ind with increased time needed; O2 sat to 88%  Transfers Overall transfer level: Independent Equipment used: Rolling walker (2 wheeled)             General transfer comment: Patient able to complete STS modI with RW with O2 remaining at 88%. Patient able to transfer bed<>bedside commode following ambulation with ease and ind. Following ambulation, toileting,  coughing, and transfer back to bed patient O2 sat to 74%. Respiratory in room, applied high flow cannula for O2 to rise to 90%  Ambulation/Gait Ambulation/Gait assistance: Supervision Gait Distance (Feet): 50 Feet Assistive device: Rolling walker (2 wheeled)   Gait velocity: decreased   General Gait Details: Ambulated on 6L. Cuing to "stay inside walker". Patient with better PLB today, but still has increased dicciulty with this with ambulation. Patient able to ambulate well with RW, O2 to 78% following, able to rise to 89% with PLB following before toileting   Stairs             Wheelchair Mobility    Modified Rankin (Stroke Patients Only)       Balance                                            Cognition Arousal/Alertness: Awake/alert Behavior During Therapy: WFL for tasks assessed/performed Overall Cognitive Status: Within Functional Limits for tasks assessed                                        Exercises Other Exercises Other Exercises: Patient able to complete STS with modi with good RW safety carry over from last session and no decrease in O2. Patrient needs in cuing thorughtout ambulation for RW safety, which he  is able to comply with well. PT continued cuing for PLB which patient is able to complete well in sitting, with increased difficulty during ambultion. Following ambulation patient demonstrates modI chair to [facility] transfer with min cuing for hand placement. Following toileting patient has a coughing spell and was encouraged by PT to eject his fleam, which he is able to do. Following session and return to bed patietn O2 into low 70s, respiratory applied high flow cannula and elevated O2 to 90%    General Comments        Pertinent Vitals/Pain Pain Assessment: No/denies pain    Home Living                      Prior Function            PT Goals (current goals can now be found in the care plan section) Acute  Rehab PT Goals Patient Stated Goal: Go home PT Goal Formulation: With family Time For Goal Achievement: 11/07/18 Potential to Achieve Goals: Fair Progress towards PT goals: Progressing toward goals    Frequency    Min 2X/week      PT Plan      Co-evaluation              AM-PAC PT "6 Clicks" Mobility   Outcome Measure  Help needed turning from your back to your side while in a flat bed without using bedrails?: None Help needed moving from lying on your back to sitting on the side of a flat bed without using bedrails?: A Little Help needed moving to and from a bed to a chair (including a wheelchair)?: None Help needed standing up from a chair using your arms (e.g., wheelchair or bedside chair)?: A Little Help needed to walk in hospital room?: A Little Help needed climbing 3-5 steps with a railing? : Total 6 Click Score: 18    End of Session Equipment Utilized During Treatment: Gait belt;Oxygen Activity Tolerance: Patient limited by fatigue Patient left: with call bell/phone within reach;in chair;with chair alarm set Nurse Communication: Mobility status PT Visit Diagnosis: Muscle weakness (generalized) (M62.81);Difficulty in walking, not elsewhere classified (R26.2)     Time: 1610-9604 PT Time Calculation (min) (ACUTE ONLY): 38 min  Charges:  $Therapeutic Activity: 38-52 mins                    Shelton Silvas PT, DPT  Shelton Silvas 10/26/2018, 3:52 PM

## 2018-10-26 NOTE — Consult Note (Signed)
[facility] for Warfarin Indication: atrial fibrillation  Allergies  Allergen Reactions  . Atorvastatin   . Simvastatin     Patient Measurements: Height: 6\' 2"  (188 cm) Weight: 168 lb 11.2 oz (76.5 kg) IBW/kg (Calculated) : 82.2  Vital Signs: Temp: 98.5 F (36.9 C) (04/25 0732) Temp Source: Oral (04/25 0732) BP: 107/65 (04/25 0732) Pulse Rate: 55 (04/25 0732)  Labs: Recent Labs    10/24/18 0331 10/24/18 1759 10/25/18 0543 10/26/18 0603  HGB 12.1*  --  12.6* 12.9*  HCT 36.6*  --  38.2* 39.1  PLT 206  --  194 190  LABPROT 32.7*  --  39.0* 32.1*  INR 3.3*  --  4.1* 3.2*  CREATININE 1.01  --  1.04 1.03  TROPONINI  --  <0.03  --   --     Estimated Creatinine Clearance: 51.6 mL/min (by C-G formula based on SCr of 1.03 mg/dL).   Assessment: Pharmacy was consulted to start dosing warfarin. INR increased from 2.6 to 3.3 on 4/23. Patient's home dose is 1.25 mg M-W-F and 2.5 mg T-Th-Sat-Sun. DDI: APAP- inc risk of bleeding, digoxin, amio increase INR, Bactrim started 4/24.  Date INR Warfarin Dose  4/22 2.6 1.25 mg  4/23 3.3 Hold  4/24 4.1 Hold  4/25 3.2     Goal of Therapy:  INR 2-3 Monitor platelets by anticoagulation protocol: Yes   Plan:  INR is supratherapeutic but trending down. Will hold warfarin dose tonight until INR<3. Amiodarone was d/c'ed due to toxicity.  Daily INR ordered. CBC stable. Monitor for any sign or symptoms of bleeding.   Pharmacy will continue to follow.   Rocky Morel, PharmD, BCPS  Clinical Pharmacist  10/26/2018,11:06 AM

## 2018-10-26 NOTE — Progress Notes (Signed)
Patient called out for assistance a little after 0600; Nira Conn went to assess patient and called this RN to come to room.  Patient hypoxic to the 70s.  Albuterol neb given; patient repositioned.  On call provider, NP, notified. CXR, BNP, and IV lasix ordered.  These orders were executed.  Oxygen improved to low 90s.

## 2018-10-26 NOTE — Progress Notes (Signed)
Progress Note  Patient Name: Joseph Barton Date of Encounter: 10/26/2018  Primary Cardiologist  Tresa Garter  Subjective   Breathing better now that back in bed   No CP    Inpatient Medications    Scheduled Meds: . digoxin  0.0625 mg Oral Daily  . docusate sodium  200 mg Oral BID  . furosemide  40 mg Oral Daily  . ipratropium-albuterol  3 mL Nebulization TID  . metoprolol succinate  12.5 mg Oral Daily  . multivitamin with minerals  1 tablet Oral Daily  . pantoprazole  40 mg Oral Daily  . polyethylene glycol  17 g Oral Daily  . predniSONE  40 mg Oral Q breakfast  . sodium chloride flush  3 mL Intravenous Q12H  . sulfamethoxazole-trimethoprim  1 tablet Oral Daily  . tamsulosin  0.4 mg Oral Daily  . Warfarin - Pharmacist Dosing Inpatient   Does not apply q1800   Continuous Infusions: . sodium chloride Stopped (10/22/18 1845)  . sodium chloride     PRN Meds: sodium chloride, acetaminophen, albuterol, benzonatate, guaiFENesin-dextromethorphan, nitroGLYCERIN, sodium chloride flush, traZODone   Vital Signs    Vitals:   10/25/18 2010 10/26/18 0439 10/26/18 0732 10/26/18 0734  BP: 117/70 (!) 87/61 107/65   Pulse: 77 (!) 57 (!) 55   Resp: 20 20 19    Temp: 97.6 F (36.4 C) 97.9 F (36.6 C) 98.5 F (36.9 C)   TempSrc: Oral Oral Oral   SpO2: 92% 92% (!) 88% 90%  Weight:  76.5 kg    Height:        Intake/Output Summary (Last 24 hours) at 10/26/2018 1220 Last data filed at 10/26/2018 1059 Gross per 24 hour  Intake 243 ml  Output 950 ml  Net -707 ml   Last 3 Weights 10/26/2018 10/25/2018 10/24/2018  Weight (lbs) 168 lb 11.2 oz 172 lb 6.4 oz 173 lb 11.2 oz  Weight (kg) 76.522 kg 78.2 kg 78.79 kg      Telemetry    AFib  PVCs, NSVT (limited)- Personally Reviewed  ECG    Not done - Personally Reviewed  Physical Exam  Pt in NAD in bed  GEN: No acute distress.   Neck: No JVD Cardiac: RRR, no murmurs, rubs, or gallops.  Respiratory: Rales at bases    GI: Soft,  nontender, non-distended  MS: No edema; No deformity. Neuro:  Nonfocal  Psych: Normal affect   Labs    Chemistry Recent Labs  Lab 10/20/18 1840  10/24/18 0331 10/25/18 0543 10/26/18 0603  NA 135   < > 135 134* 135  K 4.4   < > 4.4 4.7 4.3  CL 103   < > 102 99 100  CO2 23   < > 23 28 27   GLUCOSE 119*   < > 166* 103* 101*  BUN 27*   < > 33* 36* 35*  CREATININE 1.24   < > 1.01 1.04 1.03  CALCIUM 7.9*   < > 8.5* 8.4* 8.4*  PROT 7.0  --   --   --   --   ALBUMIN 2.8*  --   --   --   --   AST 30  --   --   --   --   ALT 20  --   --   --   --   ALKPHOS 92  --   --   --   --   BILITOT 0.4  --   --   --   --  GFRNONAA 51*   < > >60 >60 >60  GFRAA 59*   < > >60 >60 >60  ANIONGAP 9   < > 10 7 8    < > = values in this interval not displayed.     Hematology Recent Labs  Lab 10/24/18 0331 10/25/18 0543 10/26/18 0603  WBC 14.7* 11.0* 10.0  RBC 4.06* 4.20* 4.25  HGB 12.1* 12.6* 12.9*  HCT 36.6* 38.2* 39.1  MCV 90.1 91.0 92.0  MCH 29.8 30.0 30.4  MCHC 33.1 33.0 33.0  RDW 14.1 14.3 14.3  PLT 206 194 190    Cardiac Enzymes Recent Labs  Lab 10/20/18 1836 10/22/18 0300 10/24/18 1759  TROPONINI <0.03 <0.03 <0.03   No results for input(s): TROPIPOC in the last 168 hours.   BNP Recent Labs  Lab 10/20/18 1837 10/26/18 0603  BNP 394.0* 354.0*     DDimer No results for input(s): DDIMER in the last 168 hours.   Radiology    Dg Chest Port 1 View  Result Date: 10/26/2018 CLINICAL DATA:  Increased shortness of breath. EXAM: PORTABLE CHEST 1 VIEW COMPARISON:  10/22/2018. FINDINGS: Cardiomegaly. Diffuse BILATERAL interstitial and alveolar lung disease not significantly changed from priors. No pneumothorax or effusion. Thoracic atherosclerosis. Bones unremarkable. IMPRESSION: Stable chest. Electronically Signed   By: Staci Righter M.D.   On: 10/26/2018 07:01    Cardiac Studies   Patient Profile     85 y.o. male hx of permanent atrial fib (on coumadin), CAD, systolic CHF  (LVEF 08%), mod to sever MR, AAA (s/p repair), HL (intolerantto statins)HTN, pulmonary nodules and neuropathy   Admitted with acute resp distress   Asked to see for NSVT    Assessment & Plan    1   NSVT  Will increase toprol to 25 mg /day      2   Permanent atrial fib   (CHADSVASc score 5)   On coumadin     3  Pulmonary  Pt had episode of desaturation last night  Given lasix   Again, desats with activity    CT with susp for intersitial lung dz / edema   WESR is elevated at 51.  This goes along with amio lung toxicity   Amiodarone has been stopped  Pt on prednisone   Would continue to give lasix   Follow renal funciton   4  Acute on chronic systolic CHF   Given lasix   Would give additional later   Follow renal function and electrolytes   4  Mitral regurg.   Declined MV clip    5  CAD  Denies CP     For questions or updates, please contact [facility] Please consult [email or web address] for contact info under        Signed, Dorris Carnes, MD  10/26/2018, 12:20 PM

## 2018-10-26 NOTE — Progress Notes (Signed)
Pulmonary Medicine          Date: 10/26/2018,   MRN# [id number] Joseph Barton 1927/05/14     AdmissionWeight: 83 kg                 CurrentWeight: 76.5 kg        SUBJECTIVE   Patient resting in bed, has been using IS able to bring up yellow mucus now.  Diuresing-  900cc urine overnight.    PAST MEDICAL HISTORY   Past Medical History:  Diagnosis Date   Abdominal aortic aneurysm ([place]) 01-2004   4.7 x 4.7 cm.   CAD (coronary artery disease) 01/25/2004   a. 01/2004 Ant MI/DES to LAD;  b. 10/2011 Neg MV.   CHF (congestive heart failure) (H[facility])    Chronic systolic heart failure ([place])    a. 08/2016 Echo: EF 35%, nl RV fxn, mod to sev MR, mild to mod TR, mod biatrial enlargement.   Hyperlipidemia    Hypertension    Ischemic cardiomyopathy    a. 08/2016 Echo: EF 35%.   Moderate to Severe Mitral regurgitation    a. 08/2016 Echo: Mod-sev MR.   Permanent atrial fibrillation    a. Chronic coumadin (CHA2DS2VASc = 5).   Pulmonary nodules    Noted on abdominal CT   Statin intolerance      SURGICAL HISTORY   Past Surgical History:  Procedure Laterality Date   ABDOMINAL AORTIC ANEURYSM REPAIR     12/02/2007 [facility]   ABDOMINAL AORTIC ANEURYSM REPAIR  2006   [facility]    CARDIAC CATHETERIZATION  2009   [facility]   CORONARY ANGIOPLASTY WITH STENT PLACEMENT  2005   Cypher stent LAD    Cypher stents to LAD     01/25/2004   SKIN SURGERY  2016   [facility] skin cancer     FAMILY HISTORY   Family History  Problem Relation Age of Onset   Heart attack Father 31       Died w/ Heart attack   Diabetes Father    Heart attack Brother    Heart disease Brother    Diabetes Sister    Heart disease Sister      SOCIAL HISTORY   Social History   Tobacco Use   Smoking status: Former Smoker    Last attempt to quit: 12/01/1987    Years since quitting: 30.9   Smokeless tobacco: Never Used  Substance Use Topics   Alcohol use: No   Drug use: No      MEDICATIONS    Home Medication:    Current Medication:  Current Facility-Administered Medications:    0.9 %  sodium chloride infusion, 250 mL, Intravenous, PRN, Pyreddy, Pavan, MD, Stopped at 10/22/18 1845   0.9 %  sodium chloride infusion, , Intravenous, Continuous, Ojie, Jude, MD   acetaminophen (TYLENOL) tablet 650 mg, 650 mg, Oral, Q6H PRN, Ojie, Jude, MD, 650 mg at 10/26/18 1114   albuterol (PROVENTIL) (2.5 MG/3ML) 0.083% nebulizer solution 2.5 mg, 2.5 mg, Inhalation, Q6H PRN, Pyreddy, Pavan, MD, 2.5 mg at 10/26/18 0620   benzonatate (TESSALON) capsule 100 mg, 100 mg, Oral, TID PRN, Stark Jock, Jude, MD, 100 mg at 10/22/18 9326   digoxin (LANOXIN) tablet 0.0625 mg, 0.0625 mg, Oral, Daily, Pyreddy, Pavan, MD, 0.0625 mg at 10/26/18 1043   docusate sodium (COLACE) capsule 200 mg, 200 mg, Oral, BID, Harrie Foreman, MD, 200 mg at 10/26/18 1040   furosemide (LASIX) tablet 40 mg, 40 mg, Oral,  Daily, Vaughan Basta, MD, 40 mg at 10/26/18 1058   guaiFENesin-dextromethorphan (ROBITUSSIN DM) 100-10 MG/5ML syrup 5 mL, 5 mL, Oral, Q4H PRN, Ojie, Jude, MD, 5 mL at 10/22/18 1609   ipratropium-albuterol (DUONEB) 0.5-2.5 (3) MG/3ML nebulizer solution 3 mL, 3 mL, Nebulization, TID, Ojie, Jude, MD, 3 mL at 10/26/18 0805   metoprolol succinate (TOPROL-XL) 24 hr tablet 12.5 mg, 12.5 mg, Oral, Daily, Pyreddy, Pavan, MD, 12.5 mg at 10/26/18 1040   multivitamin with minerals tablet 1 tablet, 1 tablet, Oral, Daily, Ojie, Jude, MD, 1 tablet at 10/26/18 1041   nitroGLYCERIN (NITROSTAT) SL tablet 0.4 mg, 0.4 mg, Sublingual, Q5 min PRN, Ojie, Jude, MD   pantoprazole (PROTONIX) EC tablet 40 mg, 40 mg, Oral, Daily, Pyreddy, Pavan, MD, 40 mg at 10/26/18 1041   polyethylene glycol (MIRALAX / GLYCOLAX) packet 17 g, 17 g, Oral, Daily, Harrie Foreman, MD, 17 g at 10/26/18 1049   predniSONE (DELTASONE) tablet 40 mg, 40 mg, Oral, Q breakfast, Lanney Gins, Issak Goley, MD, 40 mg at 10/26/18 1045    sodium chloride flush (NS) 0.9 % injection 3 mL, 3 mL, Intravenous, Q12H, Pyreddy, Pavan, MD, 3 mL at 10/26/18 1054   sodium chloride flush (NS) 0.9 % injection 3 mL, 3 mL, Intravenous, PRN, Pyreddy, Pavan, MD, 3 mL at 10/24/18 0641   sulfamethoxazole-trimethoprim (BACTRIM) 400-80 MG per tablet 1 tablet, 1 tablet, Oral, Daily, Ojie, Jude, MD, 1 tablet at 10/26/18 1039   tamsulosin (FLOMAX) capsule 0.4 mg, 0.4 mg, Oral, Daily, Pyreddy, Pavan, MD, 0.4 mg at 10/26/18 1039   traZODone (DESYREL) tablet 50 mg, 50 mg, Oral, QHS PRN, Mayo, Pete Pelt, MD, 50 mg at 10/25/18 2210   Warfarin - Pharmacist Dosing Inpatient, , Does not apply, q1800, Ojie, Jude, MD    ALLERGIES   Atorvastatin and Simvastatin     REVIEW OF SYSTEMS    Review of Systems:  Gen:  Denies  fever, sweats, chills weigh loss  HEENT: Denies blurred vision, double vision, ear pain, eye pain, hearing loss, nose bleeds, sore throat Cardiac:  No dizziness, chest pain or heaviness, chest tightness,edema Resp:   Denies cough or sputum porduction, shortness of breath,wheezing, hemoptysis,  Gi: Denies swallowing difficulty, stomach pain, nausea or vomiting, diarrhea, constipation, bowel incontinence Gu:  Denies bladder incontinence, burning urine Ext:   Denies Joint pain, stiffness or swelling Skin: Denies  skin rash, easy bruising or bleeding or hives Endoc:  Denies polyuria, polydipsia , polyphagia or weight change Psych:   Denies depression, insomnia or hallucinations   Other:  All other systems negative   VS: BP 107/65 (BP Location: Left Arm)    Pulse (!) 55    Temp 98.5 F (36.9 C) (Oral)    Resp 19    Ht 6\' 2"  (1.88 m)    Wt 76.5 kg    SpO2 90%    BMI 21.66 kg/m      PHYSICAL EXAM    GENERAL:NAD, no fevers, chills, no weakness no fatigue HEAD: Normocephalic, atraumatic.  EYES: Pupils equal, round, reactive to light. Extraocular muscles intact. No scleral icterus.  MOUTH: Moist mucosal membrane. Dentition  intact. No abscess noted.  EAR, NOSE, THROAT: Clear without exudates. No external lesions.  NECK: Supple. No thyromegaly. No nodules. No JVD.  PULMONARY: mild velcro crackles at bases bilaterally  CARDIOVASCULAR: S1 and S2. Regular rate and rhythm. No murmurs, rubs, or gallops. No edema. Pedal pulses 2+ bilaterally.  GASTROINTESTINAL: Soft, nontender, nondistended. No masses. Positive bowel sounds. No hepatosplenomegaly.  MUSCULOSKELETAL:  No swelling, clubbing, or edema. Range of motion full in all extremities.  NEUROLOGIC: Cranial nerves II through XII are intact. No gross focal neurological deficits. Sensation intact. Reflexes intact.  SKIN: No ulceration, lesions, rashes, or cyanosis. Skin warm and dry. Turgor intact.  PSYCHIATRIC: Mood, affect within normal limits. The patient is awake, alert and oriented x 3. Insight, judgment intact.       IMAGING    Ct Chest Wo Contrast  Result Date: 10/23/2018 CLINICAL DATA:  Cough, shortness of breath. EXAM: CT CHEST WITHOUT CONTRAST TECHNIQUE: Multidetector CT imaging of the chest was performed following the standard protocol without IV contrast. COMPARISON:  Radiograph of October 22, 2018. CT scan of July 15, 2018. FINDINGS: Cardiovascular: Atherosclerosis of thoracic aorta is noted without aneurysm formation. Normal cardiac size. Coronary artery calcifications are noted. No pericardial effusion. Mediastinum/Nodes: Thyroid gland and esophagus are unremarkable. Mildly enlarged mediastinal adenopathy is noted which most likely is reactive or inflammatory in etiology. Lungs/Pleura: No pneumothorax or pleural effusion is noted. Emphysematous disease is noted in both lungs. Bronchiectasis is noted in both lower lobes. There is interval development of diffuse reticular and interstitial densities throughout both lungs concerning for interstitial or atypical pneumonia or possibly edema. Upper Abdomen: No acute abnormality. Musculoskeletal: No chest wall mass  or suspicious bone lesions identified. IMPRESSION: Interval development of diffuse and interstitial densities throughout both lungs concerning for interstitial or atypical pneumonia or possibly edema. Bronchiectasis is noted in both lower lobes. Mildly enlarged mediastinal adenopathy is noted which most likely is reactive or inflammatory in etiology. Coronary artery calcifications are noted suggesting coronary artery disease. Aortic Atherosclerosis (ICD10-I70.0) and Emphysema (ICD10-J43.9). Electronically Signed   By: Marijo Conception M.D.   On: 10/23/2018 10:15   Dg Chest Port 1 View  Result Date: 10/26/2018 CLINICAL DATA:  Increased shortness of breath. EXAM: PORTABLE CHEST 1 VIEW COMPARISON:  10/22/2018. FINDINGS: Cardiomegaly. Diffuse BILATERAL interstitial and alveolar lung disease not significantly changed from priors. No pneumothorax or effusion. Thoracic atherosclerosis. Bones unremarkable. IMPRESSION: Stable chest. Electronically Signed   By: Staci Righter M.D.   On: 10/26/2018 07:01   Dg Chest Port 1 View  Result Date: 10/22/2018 CLINICAL DATA:  Shortness of breath. EXAM: PORTABLE CHEST 1 VIEW COMPARISON:  Chest x-ray dated October 20, 2018. FINDINGS: Stable cardiomediastinal silhouette. Bilateral diffuse interstitial and alveolar airspace opacities with peripheral predominance again noted, slightly worsened in both upper lobes. No pleural effusion or pneumothorax. No acute osseous abnormality. IMPRESSION: 1. Peripheral predominant bilateral interstitial and alveolar airspace disease, worsened in both upper lobes. Electronically Signed   By: Titus Dubin M.D.   On: 10/22/2018 19:41   Dg Chest Portable 1 View  Result Date: 10/20/2018 CLINICAL DATA:  Shortness of breath, hypoxia EXAM: PORTABLE CHEST 1 VIEW COMPARISON:  09/24/2018 FINDINGS: Bilateral diffuse interstitial and alveolar airspace opacities with a peripheral predominance. No pleural effusion or pneumothorax. Stable cardiomediastinal  silhouette. No aggressive osseous lesion. IMPRESSION: 1. Chronic interstitial lung disease. 2. Increased bilateral interstitial and alveolar airspace opacities with a peripheral predominance. Differential considerations include multilobar pneumonia including atypical infection such as viral pneumonia. 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 daily plan for 2 months slow taper - home with O2 at least for short term  - pulmonary clinic follow up will set up post d/c -Atelectasis - please provide patient with Acapella flutter valve and IS to take home with him. Chest PT today.                -please encourage patient to use every hour several times.   Thank you for allowing me to participate in the care of this patient.    Patient/Family are satisfied with care plan and all questions have been answered.  This document was prepared using Dragon voice recognition software and may include unintentional dictation errors.     Ottie Glazier, M.D.  Division of [place]

## 2018-10-27 LAB — PROTIME-INR
INR: 2.8 — ABNORMAL HIGH (ref 0.8–1.2)
Prothrombin Time: 28.8 seconds — ABNORMAL HIGH (ref 11.4–15.2)

## 2018-10-27 LAB — BASIC METABOLIC PANEL
Anion gap: 10 (ref 5–15)
BUN: 36 mg/dL — ABNORMAL HIGH (ref 8–23)
CO2: 27 mmol/L (ref 22–32)
Calcium: 8.3 mg/dL — ABNORMAL LOW (ref 8.9–10.3)
Chloride: 98 mmol/L (ref 98–111)
Creatinine, Ser: 1.17 mg/dL (ref 0.61–1.24)
GFR calc Af Amer: >60 mL/min (ref >60)
GFR calc non Af Amer: 55 mL/min — ABNORMAL LOW (ref >60)
Glucose, Bld: 133 mg/dL — ABNORMAL HIGH (ref 70–99)
Potassium: 4.3 mmol/L (ref 3.5–5.1)
Sodium: 135 mmol/L (ref 135–145)

## 2018-10-27 MED ORDER — FLUTICASONE PROPIONATE 50 MCG/ACT NA SUSP
1.0000 | Freq: Every day | NASAL | Status: DC
  Administered 2018-10-27 – 2018-11-01 (×6): 1 via NASAL
  Filled 2018-10-27: qty 16

## 2018-10-27 MED ORDER — WARFARIN SODIUM 1 MG PO TABS
1.0000 mg | ORAL_TABLET | Freq: Once | ORAL | Status: AC
  Administered 2018-10-27: 1 mg via ORAL
  Filled 2018-10-27: qty 1

## 2018-10-27 NOTE — Progress Notes (Signed)
Progress Note  Patient Name: Joseph Barton Date of Encounter: 10/27/2018  Primary Cardiologist  Tresa Garter  Subjective    Breathing is better today  No CP    Inpatient Medications    Scheduled Meds: . digoxin  0.0625 mg Oral Daily  . docusate sodium  200 mg Oral BID  . furosemide  40 mg Oral Daily  . ipratropium-albuterol  3 mL Nebulization TID  . metoprolol succinate  25 mg Oral Daily  . multivitamin with minerals  1 tablet Oral Daily  . pantoprazole  40 mg Oral Daily  . polyethylene glycol  17 g Oral Daily  . predniSONE  40 mg Oral Q breakfast  . sodium chloride flush  3 mL Intravenous Q12H  . sulfamethoxazole-trimethoprim  1 tablet Oral Daily  . tamsulosin  0.4 mg Oral Daily  . warfarin  1 mg Oral ONCE-1800  . Warfarin - Pharmacist Dosing Inpatient   Does not apply q1800   Continuous Infusions: . sodium chloride Stopped (10/22/18 1845)  . sodium chloride     PRN Meds: sodium chloride, acetaminophen, albuterol, benzonatate, guaiFENesin-dextromethorphan, nitroGLYCERIN, sodium chloride flush, traZODone   Vital Signs    Vitals:   10/26/18 1957 10/27/18 0505 10/27/18 0735 10/27/18 0808  BP: 102/63 (!) 103/57 (!) 102/59   Pulse: 60 (!) 57 66   Resp: 20 18    Temp: 97.8 F (36.6 C) 97.7 F (36.5 C) 97.6 F (36.4 C)   TempSrc: Oral Oral Oral   SpO2: 97% 92% 92% 90%  Weight:  76.3 kg    Height:        Intake/Output Summary (Last 24 hours) at 10/27/2018 1056 Last data filed at 10/27/2018 1013 Gross per 24 hour  Intake 480 ml  Output 1050 ml  Net -570 ml    I/O  Neg 5.6 L   Last 3 Weights 10/27/2018 10/26/2018 10/25/2018  Weight (lbs) 168 lb 3.4 oz 168 lb 11.2 oz 172 lb 6.4 oz  Weight (kg) 76.3 kg 76.522 kg 78.2 kg      Telemetry  Atrial fib    Occasional NSVT  Limited  - Personally Reviewed  ECG    Not done - Personally Reviewed  Physical Exam  Pt in NAD in bed  GEN: No acute distress.   Neck: No JVD Cardiac:  Irreg irreg  , no murmurs, rubs, or  gallops.  Respiratory: Decreased airflow    GI: Soft, nontender, non-distended  MS: No edema; No deformity. Neuro:  Nonfocal  Psych: Normal affect   Labs    Chemistry Recent Labs  Lab 10/20/18 1840  10/25/18 0543 10/26/18 0603 10/27/18 0352  NA 135   < > 134* 135 135  K 4.4   < > 4.7 4.3 4.3  CL 103   < > 99 100 98  CO2 23   < > '28 27 27  '$ GLUCOSE 119*   < > 103* 101* 133*  BUN 27*   < > 36* 35* 36*  CREATININE 1.24   < > 1.04 1.03 1.17  CALCIUM 7.9*   < > 8.4* 8.4* 8.3*  PROT 7.0  --   --   --   --   ALBUMIN 2.8*  --   --   --   --   AST 30  --   --   --   --   ALT 20  --   --   --   --   ALKPHOS 92  --   --   --   --  BILITOT 0.4  --   --   --   --   GFRNONAA 51*   < > >60 >60 55*  GFRAA 59*   < > >60 >60 >60  ANIONGAP 9   < > '7 8 10   '$ < > = values in this interval not displayed.     Hematology Recent Labs  Lab 10/24/18 0331 10/25/18 0543 10/26/18 0603  WBC 14.7* 11.0* 10.0  RBC 4.06* 4.20* 4.25  HGB 12.1* 12.6* 12.9*  HCT 36.6* 38.2* 39.1  MCV 90.1 91.0 92.0  MCH 29.8 30.0 30.4  MCHC 33.1 33.0 33.0  RDW 14.1 14.3 14.3  PLT 206 194 190    Cardiac Enzymes Recent Labs  Lab 10/20/18 1836 10/22/18 0300 10/24/18 1759  TROPONINI <0.03 <0.03 <0.03   No results for input(s): TROPIPOC in the last 168 hours.   BNP Recent Labs  Lab 10/20/18 1837 10/26/18 0603  BNP 394.0* 354.0*     DDimer No results for input(s): DDIMER in the last 168 hours.   Radiology    Dg Chest Port 1 View  Result Date: 10/26/2018 CLINICAL DATA:  Increased shortness of breath. EXAM: PORTABLE CHEST 1 VIEW COMPARISON:  10/22/2018. FINDINGS: Cardiomegaly. Diffuse BILATERAL interstitial and alveolar lung disease not significantly changed from priors. No pneumothorax or effusion. Thoracic atherosclerosis. Bones unremarkable. IMPRESSION: Stable chest. Electronically Signed   By: Staci Righter M.D.   On: 10/26/2018 07:01    Cardiac Studies   Patient Profile     85 y.o. male hx  of permanent atrial fib (on coumadin), CAD, systolic CHF (LVEF 62%), mod to sever MR, AAA (s/p repair), HL (intolerantto statins)HTN, pulmonary nodules and neuropathy   Admitted with acute resp distress   Asked to see for NSVT    Assessment & Plan    1   NSVT   increased toprol to 25 mg /day   Rate control for afib   Off amio   2   Permanent atrial fib   (CHADSVASc score 5)   On coumadin   Low dose b blocker   WIll need to follow as amio washes out    3  Pulmonary Amio discontinued s  ESR was 51   Current pulm issues probably amiodarone induced on top of chronic lung disease, CHF and MR    Did improve some with lasix   Watch I/O and limit Na intake   COntinjes on prednisone     4  Acute on chronic systolic CHF   Given lasix  Yesterday with increased UO   Cr bumped a little   Hold today  Follow I/O   2 g sodium diet   4  Mitral regurg.   Follow I/O   Contrib to dyspnea   N  5  CAD  Denies CP     For questions or updates, please contact [facility] Please consult [email or web address] for contact info under        Signed, Dorris Carnes, MD  10/27/2018, 10:56 AM

## 2018-10-27 NOTE — Progress Notes (Signed)
[place] [facility] [place] NAME: Joseph Barton    MR#:  [id number]  DATE OF BIRTH:  [birth year]  SUBJECTIVE:  CHIEF COMPLAINT:   Chief Complaint  Patient presents with  . Weakness   Patient had an episode of chest discomfort yesterday which is resolved.  Troponin was negative.  Twelve-lead EKG unremarkable.  Rested better last night.  No fevers overnight. Patient still requiring 5-6 L of oxygen via nasal cannula this morning.  REVIEW OF SYSTEMS:  Review of Systems  Constitutional: Negative for chills and fever.  HENT: Negative for hearing loss and tinnitus.   Eyes: Negative for blurred vision and double vision.  Respiratory: Positive for cough and shortness of breath.        Shortness of breath improving  Cardiovascular: Negative for chest pain and palpitations.  Gastrointestinal: Negative for abdominal pain, heartburn, nausea and vomiting.  Genitourinary: Negative for dysuria and urgency.  Musculoskeletal: Negative for myalgias and neck pain.  Skin: Negative for itching and rash.  Neurological: Negative for dizziness and headaches.  Psychiatric/Behavioral: Negative for depression and hallucinations.    DRUG ALLERGIES:   Allergies  Allergen Reactions  . Atorvastatin   . Simvastatin    VITALS:  Blood pressure (!) 102/59, pulse 66, temperature 97.6 F (36.4 C), temperature source Oral, resp. rate 18, height 6\' 2"  (1.88 m), weight 76.3 kg, SpO2 91 %. PHYSICAL EXAMINATION:   Physical Exam  Constitutional: He is oriented to person, place, and time. He appears well-developed and well-nourished.  HENT:  Head: Normocephalic and atraumatic.  Right Ear: External ear normal.  Eyes: Pupils are equal, round, and reactive to light. Conjunctivae are normal. Right eye exhibits no discharge. No scleral icterus.  Neck: Normal range of motion. Neck supple. No tracheal deviation present.  Cardiovascular:  Irregularly irregular  Respiratory: Effort  normal. He has rales.  GI: Soft. Bowel sounds are normal. There is no abdominal tenderness.  Musculoskeletal: Normal range of motion.        General: No edema.     Comments: Chronic venous stasis changes to both lower extremity.  Neurological: He is alert and oriented to person, place, and time. No cranial nerve deficit.  Skin: Skin is warm. He is not diaphoretic. No erythema.  Psychiatric: He has a normal mood and affect. His behavior is normal.   LABORATORY PANEL:  Male CBC Recent Labs  Lab 10/26/18 0603  WBC 10.0  HGB 12.9*  HCT 39.1  PLT 190   ------------------------------------------------------------------------------------------------------------------ Chemistries  Recent Labs  Lab 10/20/18 1840  10/26/18 0603 10/27/18 0352  NA 135   < > 135 135  K 4.4   < > 4.3 4.3  CL 103   < > 100 98  CO2 23   < > 27 27  GLUCOSE 119*   < > 101* 133*  BUN 27*   < > 35* 36*  CREATININE 1.24   < > 1.03 1.17  CALCIUM 7.9*   < > 8.4* 8.3*  MG  --    < > 2.2  --   AST 30  --   --   --   ALT 20  --   --   --   ALKPHOS 92  --   --   --   BILITOT 0.4  --   --   --    < > = values in this interval not displayed.   RADIOLOGY:  No results found. ASSESSMENT AND PLAN:  85 year old male patient with a known history of pneumonia in the past, chronic systolic heart failure with EF of 25-30%, coronary artery disease, abdominal aortic aneurysm, hyperlipidemia, hypertension, mitral regurgitation, atrial fibrillation on Coumadin presented to emergency room for shortness of breath and cough.   1.Acute respiratory failure with hypoxia Initially felt to be secondary to pneumonia.  Patient evaluated by infectious disease.  Patient was not responding to antibiotics.  Antibiotics already discontinued since no clinical evidence to support pneumonia. Pneumonia ruled out. Recent CT chest without contrast done for further evaluation revealed interval development of diffuse and interstitial densities  throughout both lungs concerning for interstitial or atypical pneumonia or possibly edema. Bronchiectasis is noted in both lower lobes. 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  Patient has been on amiodarone since January 2020.  Appreciate input from infectious disease specialist and pulmonologist. Amiodarone already discontinued.   Patient started on prednisone 40 mg p.o. daily with plans for 2 months slow taper.   Infectious disease specialist recommended Bactrim SS 1 tablet daily for PCP prophylaxis since patient is going to be on prednisone for more than 30 days which has been ordered Gradually weaning down oxygen requirement.  Patient was still requiring 5-6 L this morning.   Patient will most likely require home oxygen therapy on discharge.  Patient to follow-up with pulmonary clinic post discharge. Patient will also need Acapella flutter valve and incentive spirometer to take home with him on discharge. [id number] test was negative as such COVID infection ruled out. Pulmonary embolism very unlikely since patient already on anticoagulation with INR supratherapeutic at 4.1- > 2.8  2.Multilobar pneumonia ruled out Has been ruled out.  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  So far no growth on blood cultures. Coronavirus test negative.  COVID 19 ruled out  3.Chronic atrial fibrillation Patient on Coumadin with INR supratherapeutic at 4.1.  Pharmacy assisting with dosing with goal to resume once INR less than 3 Amiodarone discontinued during this admission due to pulmonary toxicity.   cardiologist Dr.  Vilinda Blanks small dose of digoxin and metoprolol for rate control.   Electrophysiologist upon discharge   4. Hypertension Blood pressure controlled on current regimen  Lisinopril discontinued recently due to  borderline low blood pressures  5.  Chronic systolic CHF Ejection fraction of 25 to 30%.  Lasix was initially placed on hold on admission due to concern for dehydration. Due to worsening respiratory status, Lasix already resumed  Continue beta-blocker.  Lisinopril on hold due to blood pressure being borderline recently.  DVT prophylaxis; pharmacist dosing Coumadin.    I called patient's spouse listed in chart Ms. Santina Evans again today.  Updated her on patient's clinical condition.  All questions were answered and she is in agreement with the plan of care. Physical therapy has recommended placement to rehab.  Patient is hopeful to be able to go home after some more recovery in the hospital.  All the records are reviewed and case discussed with Care Management/Social Worker. Management plans discussed with the patient, and they are in agreement.  CODE STATUS: DNR  TOTAL TIME TAKING CARE OF THIS PATIENT: 33 minutes.   More than 50% of the time was spent in counseling/coordination of care: YES  POSSIBLE D/C IN 2 DAYS, DEPENDING ON CLINICAL CONDITION.   Vaughan Basta M.D on 10/27/2018 at 3:51 PM  Between 7am to 6pm - Pager -  [phone]  After 6pm go to [email or web address] - password [email or web address] [facility]  [facility]  Office  [phone]  CC: Primary care physician; Jerrol Banana., MD  Note: This dictation was prepared with Dragon dictation along with smaller phrase technology. Any transcriptional errors that result from this process are unintentional.

## 2018-10-27 NOTE — Consult Note (Signed)
[place] for Warfarin Indication: atrial fibrillation  Allergies  Allergen Reactions  . Atorvastatin   . Simvastatin     Patient Measurements: Height: 6\' 2"  (188 cm) Weight: 168 lb 3.4 oz (76.3 kg) IBW/kg (Calculated) : 82.2  Vital Signs: Temp: 97.6 F (36.4 C) (04/26 0735) Temp Source: Oral (04/26 0735) BP: 102/59 (04/26 0735) Pulse Rate: 66 (04/26 0735)  Labs: Recent Labs    10/24/18 1759 10/25/18 0543 10/26/18 0603 10/27/18 0352  HGB  --  12.6* 12.9*  --   HCT  --  38.2* 39.1  --   PLT  --  194 190  --   LABPROT  --  39.0* 32.1* 28.8*  INR  --  4.1* 3.2* 2.8*  CREATININE  --  1.04 1.03 1.17  TROPONINI <0.03  --   --   --     Estimated Creatinine Clearance: 45.3 mL/min (by C-G formula based on SCr of 1.17 mg/dL).   Assessment: Pharmacy was consulted to start dosing warfarin. INR increased from 2.6 to 3.3 on 4/23. Patient's home dose is 1.25 mg M-W-F and 2.5 mg T-Th-Sat-Sun. DDI: APAP- inc risk of bleeding, digoxin, amio increase INR, Bactrim started 4/24. Amiodarone was d/c'ed due to toxicity.   Date INR Warfarin Dose  4/22 2.6 1.25 mg  4/23 3.3 Hold  4/24 4.1 Hold  4/25 3.2 Hold  4/26 2.8     Goal of Therapy:  INR 2-3 Monitor platelets by anticoagulation protocol: Yes   Plan:  INR has trended down to within goal range.  Will order reduced dose warfarin 1 mg for tonight; warfarin has been on hold for three days.  Daily INR ordered. CBC has been stable. Monitor for any sign or symptoms of bleeding.   Pharmacy will continue to follow.   Rocky Morel, PharmD, BCPS  Clinical Pharmacist  10/27/2018,9:59 AM

## 2018-10-27 NOTE — Plan of Care (Signed)
  Problem: Safety: Goal: Ability to remain free from injury will improve Outcome: Progressing   Problem: Respiratory: Goal: Ability to maintain adequate ventilation will improve Outcome: Progressing   

## 2018-10-27 NOTE — Progress Notes (Signed)
Pulmonary Medicine          Date: 10/27/2018,   MRN# [id number] Joseph Barton 25-Mar-1927     AdmissionWeight: 83 kg                 CurrentWeight: 76.3 kg       SUBJECTIVE   Patient states he feels better,  He had increased O2 req overnight but patient shares that his nasal passages are blocked during allergic seasons and he wishes to try different interface to allow mouth breathing as well.  Case discussed with RT Fritz Pickerel and hopitalist Dr Boyce Medici.Marland Kitchen    PAST MEDICAL HISTORY   Past Medical History:  Diagnosis Date   Abdominal aortic aneurysm ([place]) 01-2004   4.7 x 4.7 cm.   CAD (coronary artery disease) 01/25/2004   a. 01/2004 Ant MI/DES to LAD;  b. 10/2011 Neg MV.   CHF (congestive heart failure) ([facility])    Chronic systolic heart failure ([place])    a. 08/2016 Echo: EF 35%, nl RV fxn, mod to sev MR, mild to mod TR, mod biatrial enlargement.   Hyperlipidemia    Hypertension    Ischemic cardiomyopathy    a. 08/2016 Echo: EF 35%.   Moderate to Severe Mitral regurgitation    a. 08/2016 Echo: Mod-sev MR.   Permanent atrial fibrillation    a. Chronic coumadin (CHA2DS2VASc = 5).   Pulmonary nodules    Noted on abdominal CT   Statin intolerance      SURGICAL HISTORY   Past Surgical History:  Procedure Laterality Date   ABDOMINAL AORTIC ANEURYSM REPAIR     12/02/2007 [facility]   ABDOMINAL AORTIC ANEURYSM REPAIR  2006   [facility]    CARDIAC CATHETERIZATION  2009   [facility]   CORONARY ANGIOPLASTY WITH STENT PLACEMENT  2005   Cypher stent LAD    Cypher stents to LAD     01/25/2004   SKIN SURGERY  2016   [facility] skin cancer     FAMILY HISTORY   Family History  Problem Relation Age of Onset   Heart attack Father 42       Died w/ Heart attack   Diabetes Father    Heart attack Brother    Heart disease Brother    Diabetes Sister    Heart disease Sister      SOCIAL HISTORY   Social History   Tobacco Use   Smoking status: Former Smoker   Last attempt to quit: 12/01/1987    Years since quitting: 30.9   Smokeless tobacco: Never Used  Substance Use Topics   Alcohol use: No   Drug use: No     MEDICATIONS    Home Medication:    Current Medication:  Current Facility-Administered Medications:    0.9 %  sodium chloride infusion, 250 mL, Intravenous, PRN, Pyreddy, Pavan, MD, Stopped at 10/22/18 1845   0.9 %  sodium chloride infusion, , Intravenous, Continuous, Ojie, Jude, MD   acetaminophen (TYLENOL) tablet 650 mg, 650 mg, Oral, Q6H PRN, Ojie, Jude, MD, 650 mg at 10/26/18 1114   albuterol (PROVENTIL) (2.5 MG/3ML) 0.083% nebulizer solution 2.5 mg, 2.5 mg, Inhalation, Q6H PRN, Pyreddy, Pavan, MD, 2.5 mg at 10/26/18 0620   benzonatate (TESSALON) capsule 100 mg, 100 mg, Oral, TID PRN, Stark Jock, Jude, MD, 100 mg at 10/26/18 2219   digoxin (LANOXIN) tablet 0.0625 mg, 0.0625 mg, Oral, Daily, Pyreddy, Pavan, MD, 0.0625 mg at 10/27/18 0919   docusate sodium (COLACE) capsule 200 mg,  200 mg, Oral, BID, Harrie Foreman, MD, 200 mg at 10/27/18 0915   furosemide (LASIX) tablet 40 mg, 40 mg, Oral, Daily, Vaughan Basta, MD, 40 mg at 10/27/18 0915   guaiFENesin-dextromethorphan (ROBITUSSIN DM) 100-10 MG/5ML syrup 5 mL, 5 mL, Oral, Q4H PRN, Ojie, Jude, MD, 5 mL at 10/22/18 1609   ipratropium-albuterol (DUONEB) 0.5-2.5 (3) MG/3ML nebulizer solution 3 mL, 3 mL, Nebulization, TID, Ojie, Jude, MD, 3 mL at 10/27/18 2330   metoprolol succinate (TOPROL-XL) 24 hr tablet 25 mg, 25 mg, Oral, Daily, Fay Records, MD   multivitamin with minerals tablet 1 tablet, 1 tablet, Oral, Daily, Ojie, Jude, MD, 1 tablet at 10/27/18 0915   nitroGLYCERIN (NITROSTAT) SL tablet 0.4 mg, 0.4 mg, Sublingual, Q5 min PRN, Ojie, Jude, MD   pantoprazole (PROTONIX) EC tablet 40 mg, 40 mg, Oral, Daily, Pyreddy, Pavan, MD, 40 mg at 10/27/18 0915   polyethylene glycol (MIRALAX / GLYCOLAX) packet 17 g, 17 g, Oral, Daily, Harrie Foreman, MD, 17 g at  10/27/18 0920   predniSONE (DELTASONE) tablet 40 mg, 40 mg, Oral, Q breakfast, Ottie Glazier, MD, 40 mg at 10/27/18 0916   sodium chloride flush (NS) 0.9 % injection 3 mL, 3 mL, Intravenous, Q12H, Pyreddy, Pavan, MD, 3 mL at 10/27/18 0932   sodium chloride flush (NS) 0.9 % injection 3 mL, 3 mL, Intravenous, PRN, Pyreddy, Reatha Harps, MD, 3 mL at 10/24/18 0641   sulfamethoxazole-trimethoprim (BACTRIM) 400-80 MG per tablet 1 tablet, 1 tablet, Oral, Daily, Ojie, Jude, MD, 1 tablet at 10/27/18 0762   tamsulosin (FLOMAX) capsule 0.4 mg, 0.4 mg, Oral, Daily, Pyreddy, Pavan, MD, 0.4 mg at 10/27/18 0915   traZODone (DESYREL) tablet 50 mg, 50 mg, Oral, QHS PRN, Mayo, Pete Pelt, MD, 50 mg at 10/26/18 2219   warfarin (COUMADIN) tablet 1 mg, 1 mg, Oral, ONCE-1800, Rocky Morel, RPH   Warfarin - Pharmacist Dosing Inpatient, , Does not apply, q1800, Ojie, Jude, MD    ALLERGIES   Atorvastatin and Simvastatin     REVIEW OF SYSTEMS    Review of Systems:  Gen:  Denies  fever, sweats, chills weigh loss  HEENT: Denies blurred vision, double vision, ear pain, eye pain, hearing loss, nose bleeds, sore throat Cardiac:  No dizziness, chest pain or heaviness, chest tightness,edema Resp:   Denies cough or sputum porduction, shortness of breath,wheezing, hemoptysis,  Gi: Denies swallowing difficulty, stomach pain, nausea or vomiting, diarrhea, constipation, bowel incontinence Gu:  Denies bladder incontinence, burning urine Ext:   Denies Joint pain, stiffness or swelling Skin: Denies  skin rash, easy bruising or bleeding or hives Endoc:  Denies polyuria, polydipsia , polyphagia or weight change Psych:   Denies depression, insomnia or hallucinations   Other:  All other systems negative   VS: BP (!) 102/59 (BP Location: Right Arm)    Pulse 66    Temp 97.6 F (36.4 C) (Oral)    Resp 18    Ht 6\' 2"  (1.88 m)    Wt 76.3 kg    SpO2 90%    BMI 21.60 kg/m      PHYSICAL EXAM    GENERAL:NAD, no fevers,  chills, no weakness no fatigue HEAD: Normocephalic, atraumatic.  EYES: Pupils equal, round, reactive to light. Extraocular muscles intact. No scleral icterus.  MOUTH: Moist mucosal membrane. Dentition intact. No abscess noted.  EAR, NOSE, THROAT: Clear without exudates. No external lesions.  NECK: Supple. No thyromegaly. No nodules. No JVD.  PULMONARY: bilateral velcro crepitations  CARDIOVASCULAR: S1  and S2. Regular rate and rhythm. No murmurs, rubs, or gallops. No edema. Pedal pulses 2+ bilaterally.  GASTROINTESTINAL: Soft, nontender, nondistended. No masses. Positive bowel sounds. No hepatosplenomegaly.  MUSCULOSKELETAL: No swelling, clubbing, or edema. Range of motion full in all extremities.  NEUROLOGIC: Cranial nerves II through XII are intact. No gross focal neurological deficits. Sensation intact. Reflexes intact.  SKIN: No ulceration, lesions, rashes, or cyanosis. Skin warm and dry. Turgor intact.  PSYCHIATRIC: Mood, affect within normal limits. The patient is awake, alert and oriented x 3. Insight, judgment intact.       IMAGING    Ct Chest Wo Contrast  Result Date: 10/23/2018 CLINICAL DATA:  Cough, shortness of breath. EXAM: CT CHEST WITHOUT CONTRAST TECHNIQUE: Multidetector CT imaging of the chest was performed following the standard protocol without IV contrast. COMPARISON:  Radiograph of October 22, 2018. CT scan of July 15, 2018. FINDINGS: Cardiovascular: Atherosclerosis of thoracic aorta is noted without aneurysm formation. Normal cardiac size. Coronary artery calcifications are noted. No pericardial effusion. Mediastinum/Nodes: Thyroid gland and esophagus are unremarkable. Mildly enlarged mediastinal adenopathy is noted which most likely is reactive or inflammatory in etiology. Lungs/Pleura: No pneumothorax or pleural effusion is noted. Emphysematous disease is noted in both lungs. Bronchiectasis is noted in both lower lobes. There is interval development of diffuse reticular  and interstitial densities throughout both lungs concerning for interstitial or atypical pneumonia or possibly edema. Upper Abdomen: No acute abnormality. Musculoskeletal: No chest wall mass or suspicious bone lesions identified. IMPRESSION: Interval development of diffuse and interstitial densities throughout both lungs concerning for interstitial or atypical pneumonia or possibly edema. Bronchiectasis is noted in both lower lobes. Mildly enlarged mediastinal adenopathy is noted which most likely is reactive or inflammatory in etiology. Coronary artery calcifications are noted suggesting coronary artery disease. Aortic Atherosclerosis (ICD10-I70.0) and Emphysema (ICD10-J43.9). Electronically Signed   By: Marijo Conception M.D.   On: 10/23/2018 10:15   Dg Chest Port 1 View  Result Date: 10/26/2018 CLINICAL DATA:  Increased shortness of breath. EXAM: PORTABLE CHEST 1 VIEW COMPARISON:  10/22/2018. FINDINGS: Cardiomegaly. Diffuse BILATERAL interstitial and alveolar lung disease not significantly changed from priors. No pneumothorax or effusion. Thoracic atherosclerosis. Bones unremarkable. IMPRESSION: Stable chest. Electronically Signed   By: Staci Righter M.D.   On: 10/26/2018 07:01   Dg Chest Port 1 View  Result Date: 10/22/2018 CLINICAL DATA:  Shortness of breath. EXAM: PORTABLE CHEST 1 VIEW COMPARISON:  Chest x-ray dated October 20, 2018. FINDINGS: Stable cardiomediastinal silhouette. Bilateral diffuse interstitial and alveolar airspace opacities with peripheral predominance again noted, slightly worsened in both upper lobes. No pleural effusion or pneumothorax. No acute osseous abnormality. IMPRESSION: 1. Peripheral predominant bilateral interstitial and alveolar airspace disease, worsened in both upper lobes. Electronically Signed   By: Titus Dubin M.D.   On: 10/22/2018 19:41   Dg Chest Portable 1 View  Result Date: 10/20/2018 CLINICAL DATA:  Shortness of breath, hypoxia EXAM: PORTABLE CHEST 1 VIEW  COMPARISON:  09/24/2018 FINDINGS: Bilateral diffuse interstitial and alveolar airspace opacities with a peripheral predominance. No pleural effusion or pneumothorax. Stable cardiomediastinal silhouette. No aggressive osseous lesion. IMPRESSION: 1. Chronic interstitial lung disease. 2. Increased bilateral interstitial and alveolar airspace opacities with a peripheral predominance. Differential considerations include multilobar pneumonia including atypical infection such as viral pneumonia. 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 daily plan for 2 months slow taper - home with O2 at least for short term  - pulmonary clinic follow up will set up post d/c -Atelectasis - please provide patient with Acapella flutter valve and IS to take home with him. Chest PT today.  -please encourage patient to use every hour several times.        -please continue to work with physical therapy   Thank you for allowing me to participate in the care of this patient.    Patient/Family are satisfied with care plan and all questions have been answered.  This document was prepared using Dragon voice recognition software and may include unintentional dictation errors.     Ottie Glazier, M.D.  [facility]

## 2018-10-28 DIAGNOSIS — I5023 Acute on chronic systolic (congestive) heart failure: Secondary | ICD-10-CM

## 2018-10-28 LAB — PROTIME-INR
INR: 2.3 — ABNORMAL HIGH (ref 0.8–1.2)
Prothrombin Time: 25.1 seconds — ABNORMAL HIGH (ref 11.4–15.2)

## 2018-10-28 MED ORDER — OXYMETAZOLINE HCL 0.05 % NA SOLN
1.0000 | Freq: Two times a day (BID) | NASAL | Status: DC
  Administered 2018-10-28: 16:00:00 1 via NASAL
  Filled 2018-10-28: qty 15

## 2018-10-28 MED ORDER — BISACODYL 5 MG PO TBEC
5.0000 mg | DELAYED_RELEASE_TABLET | Freq: Every day | ORAL | Status: DC | PRN
  Administered 2018-10-28 – 2018-10-30 (×3): 5 mg via ORAL
  Filled 2018-10-28 (×4): qty 1

## 2018-10-28 MED ORDER — WARFARIN SODIUM 3 MG PO TABS
1.5000 mg | ORAL_TABLET | Freq: Once | ORAL | Status: AC
  Administered 2018-10-28: 1.5 mg via ORAL
  Filled 2018-10-28: qty 0.5

## 2018-10-28 MED ORDER — OXYMETAZOLINE HCL 0.05 % NA SOLN
3.0000 | Freq: Two times a day (BID) | NASAL | Status: DC
  Administered 2018-10-28 – 2018-11-01 (×8): 3 via NASAL
  Filled 2018-10-28: qty 15

## 2018-10-28 NOTE — Progress Notes (Signed)
Pt with complaints of constipation, hasnt had a BM since 4/25, has been getting colace daily but is not helping. MD paged, Dr. Gwendalyn Ege gave verbal orders for dulcolax PRN. Will give and continue to monitor. Conley Simmonds, RN, BSN

## 2018-10-28 NOTE — Care Management Important Message (Signed)
Important Message  Patient Details  Name: Joseph Barton MRN: [id number] Date of Birth: July 17, 1926   Medicare Important Message Given:  Yes    Dannette Barbara 10/28/2018, 11:19 AM

## 2018-10-28 NOTE — Progress Notes (Signed)
Pulmonary Medicine          Date: 10/28/2018,   MRN# [id number] Joseph Barton [birth year]     AdmissionWeight: 83 kg                 CurrentWeight: 77.6 kg      SUBJECTIVE    Patient states he is breathing better with mask.  He still has high O2 requirment.  Currently on 8L/min.  He will likely need home O2. Complains of nasal rhinitis.    PAST MEDICAL HISTORY   Past Medical History:  Diagnosis Date   Abdominal aortic aneurysm ([place]) 01-2004   4.7 x 4.7 cm.   CAD (coronary artery disease) 01/25/2004   a. 01/2004 Ant MI/DES to LAD;  b. 10/2011 Neg MV.   CHF (congestive heart failure) (H[facility])    Chronic systolic heart failure ([place])    a. 08/2016 Echo: EF 35%, nl RV fxn, mod to sev MR, mild to mod TR, mod biatrial enlargement.   Hyperlipidemia    Hypertension    Ischemic cardiomyopathy    a. 08/2016 Echo: EF 35%.   Moderate to Severe Mitral regurgitation    a. 08/2016 Echo: Mod-sev MR.   Permanent atrial fibrillation    a. Chronic coumadin (CHA2DS2VASc = 5).   Pulmonary nodules    Noted on abdominal CT   Statin intolerance      SURGICAL HISTORY   Past Surgical History:  Procedure Laterality Date   ABDOMINAL AORTIC ANEURYSM REPAIR     12/02/2007 [facility]   ABDOMINAL AORTIC ANEURYSM REPAIR  2006   [facility]    CARDIAC CATHETERIZATION  2009   [facility]   CORONARY ANGIOPLASTY WITH STENT PLACEMENT  2005   Cypher stent LAD    Cypher stents to LAD     01/25/2004   SKIN SURGERY  2016   [facility] skin cancer     FAMILY HISTORY   Family History  Problem Relation Age of Onset   Heart attack Father 43       Died w/ Heart attack   Diabetes Father    Heart attack Brother    Heart disease Brother    Diabetes Sister    Heart disease Sister      SOCIAL HISTORY   Social History   Tobacco Use   Smoking status: Former Smoker    Last attempt to quit: 12/01/1987    Years since quitting: 30.9   Smokeless tobacco: Never Used  Substance Use  Topics   Alcohol use: No   Drug use: No     MEDICATIONS    Home Medication:    Current Medication:  Current Facility-Administered Medications:    0.9 %  sodium chloride infusion, 250 mL, Intravenous, PRN, Pyreddy, Pavan, MD, Stopped at 10/22/18 1845   0.9 %  sodium chloride infusion, , Intravenous, Continuous, Ojie, Jude, MD   acetaminophen (TYLENOL) tablet 650 mg, 650 mg, Oral, Q6H PRN, Ojie, Jude, MD, 650 mg at 10/26/18 1114   albuterol (PROVENTIL) (2.5 MG/3ML) 0.083% nebulizer solution 2.5 mg, 2.5 mg, Inhalation, Q6H PRN, Pyreddy, Pavan, MD, 2.5 mg at 10/28/18 0208   benzonatate (TESSALON) capsule 100 mg, 100 mg, Oral, TID PRN, Stark Jock, Jude, MD, 100 mg at 10/28/18 0908   digoxin (LANOXIN) tablet 0.0625 mg, 0.0625 mg, Oral, Daily, Pyreddy, Pavan, MD, 0.0625 mg at 10/28/18 0856   docusate sodium (COLACE) capsule 200 mg, 200 mg, Oral, BID, Harrie Foreman, MD, 200 mg at 10/28/18 [phone]  fluticasone (FLONASE) 50 MCG/ACT nasal spray 1 spray, 1 spray, Each Nare, Daily, Seals, Theo Dills, NP, 1 spray at 10/28/18 2409   furosemide (LASIX) tablet 40 mg, 40 mg, Oral, Daily, Vaughan Basta, MD, 40 mg at 10/28/18 0859   guaiFENesin-dextromethorphan (ROBITUSSIN DM) 100-10 MG/5ML syrup 5 mL, 5 mL, Oral, Q4H PRN, Stark Jock, Jude, MD, 5 mL at 10/28/18 0908   ipratropium-albuterol (DUONEB) 0.5-2.5 (3) MG/3ML nebulizer solution 3 mL, 3 mL, Nebulization, TID, Ojie, Jude, MD, 3 mL at 10/28/18 1359   metoprolol succinate (TOPROL-XL) 24 hr tablet 25 mg, 25 mg, Oral, Daily, Fay Records, MD   multivitamin with minerals tablet 1 tablet, 1 tablet, Oral, Daily, Ojie, Jude, MD, 1 tablet at 10/28/18 0858   nitroGLYCERIN (NITROSTAT) SL tablet 0.4 mg, 0.4 mg, Sublingual, Q5 min PRN, Ojie, Jude, MD   pantoprazole (PROTONIX) EC tablet 40 mg, 40 mg, Oral, Daily, Pyreddy, Pavan, MD, 40 mg at 10/28/18 0859   polyethylene glycol (MIRALAX / GLYCOLAX) packet 17 g, 17 g, Oral, Daily, Harrie Foreman,  MD, 17 g at 10/28/18 0856   predniSONE (DELTASONE) tablet 40 mg, 40 mg, Oral, Q breakfast, Ottie Glazier, MD, 40 mg at 10/28/18 0858   sodium chloride flush (NS) 0.9 % injection 3 mL, 3 mL, Intravenous, Q12H, Pyreddy, Pavan, MD, 3 mL at 10/28/18 0912   sodium chloride flush (NS) 0.9 % injection 3 mL, 3 mL, Intravenous, PRN, Pyreddy, Reatha Harps, MD, 3 mL at 10/24/18 0641   sulfamethoxazole-trimethoprim (BACTRIM) 400-80 MG per tablet 1 tablet, 1 tablet, Oral, Daily, Ojie, Jude, MD, 1 tablet at 10/28/18 0857   tamsulosin (FLOMAX) capsule 0.4 mg, 0.4 mg, Oral, Daily, Pyreddy, Pavan, MD, 0.4 mg at 10/28/18 0858   traZODone (DESYREL) tablet 50 mg, 50 mg, Oral, QHS PRN, Mayo, Pete Pelt, MD, 50 mg at 10/26/18 2219   warfarin (COUMADIN) tablet 1.5 mg, 1.5 mg, Oral, ONCE-1800, Dallie Piles, RPH   Warfarin - Pharmacist Dosing Inpatient, , Does not apply, q1800, Ojie, Jude, MD    ALLERGIES   Atorvastatin and Simvastatin     REVIEW OF SYSTEMS    Review of Systems:  Gen:  Denies  fever, sweats, chills weigh loss  HEENT: Denies blurred vision, double vision, ear pain, eye pain, hearing loss, nose bleeds, sore throat Cardiac:  No dizziness, chest pain or heaviness, chest tightness,edema Resp:   Denies cough or sputum porduction, shortness of breath,wheezing, hemoptysis,  Gi: Denies swallowing difficulty, stomach pain, nausea or vomiting, diarrhea, constipation, bowel incontinence Gu:  Denies bladder incontinence, burning urine Ext:   Denies Joint pain, stiffness or swelling Skin: Denies  skin rash, easy bruising or bleeding or hives Endoc:  Denies polyuria, polydipsia , polyphagia or weight change Psych:   Denies depression, insomnia or hallucinations   Other:  All other systems negative   VS: BP 106/73 (BP Location: Right Arm)    Pulse 79    Temp 97.7 F (36.5 C) (Oral)    Resp 20    Ht 6\' 2"  (1.88 m)    Wt 77.6 kg    SpO2 (!) 89%    BMI 21.97 kg/m      PHYSICAL EXAM     GENERAL:NAD, no fevers, chills, no weakness no fatigue HEAD: Normocephalic, atraumatic.  EYES: Pupils equal, round, reactive to light. Extraocular muscles intact. No scleral icterus.  MOUTH: Moist mucosal membrane. Dentition intact. No abscess noted.  EAR, NOSE, THROAT: Clear without exudates. No external lesions.  NECK: Supple. No thyromegaly. No nodules. No  JVD.  PULMONARY: Bilateral velcro crackles no wheezing.  CARDIOVASCULAR: S1 and S2. Regular rate and rhythm. No murmurs, rubs, or gallops. No edema. Pedal pulses 2+ bilaterally.  GASTROINTESTINAL: Soft, nontender, nondistended. No masses. Positive bowel sounds. No hepatosplenomegaly.  MUSCULOSKELETAL: No swelling, clubbing, or edema. Range of motion full in all extremities.  NEUROLOGIC: Cranial nerves II through XII are intact. No gross focal neurological deficits. Sensation intact. Reflexes intact.  SKIN: No ulceration, lesions, rashes, or cyanosis. Skin warm and dry. Turgor intact.  PSYCHIATRIC: Mood, affect within normal limits. The patient is awake, alert and oriented x 3. Insight, judgment intact.       IMAGING    Ct Chest Wo Contrast  Result Date: 10/23/2018 CLINICAL DATA:  Cough, shortness of breath. EXAM: CT CHEST WITHOUT CONTRAST TECHNIQUE: Multidetector CT imaging of the chest was performed following the standard protocol without IV contrast. COMPARISON:  Radiograph of October 22, 2018. CT scan of July 15, 2018. FINDINGS: Cardiovascular: Atherosclerosis of thoracic aorta is noted without aneurysm formation. Normal cardiac size. Coronary artery calcifications are noted. No pericardial effusion. Mediastinum/Nodes: Thyroid gland and esophagus are unremarkable. Mildly enlarged mediastinal adenopathy is noted which most likely is reactive or inflammatory in etiology. Lungs/Pleura: No pneumothorax or pleural effusion is noted. Emphysematous disease is noted in both lungs. Bronchiectasis is noted in both lower lobes. There is  interval development of diffuse reticular and interstitial densities throughout both lungs concerning for interstitial or atypical pneumonia or possibly edema. Upper Abdomen: No acute abnormality. Musculoskeletal: No chest wall mass or suspicious bone lesions identified. IMPRESSION: Interval development of diffuse and interstitial densities throughout both lungs concerning for interstitial or atypical pneumonia or possibly edema. Bronchiectasis is noted in both lower lobes. Mildly enlarged mediastinal adenopathy is noted which most likely is reactive or inflammatory in etiology. Coronary artery calcifications are noted suggesting coronary artery disease. Aortic Atherosclerosis (ICD10-I70.0) and Emphysema (ICD10-J43.9). Electronically Signed   By: Marijo Conception M.D.   On: 10/23/2018 10:15   Dg Chest Port 1 View  Result Date: 10/26/2018 CLINICAL DATA:  Increased shortness of breath. EXAM: PORTABLE CHEST 1 VIEW COMPARISON:  10/22/2018. FINDINGS: Cardiomegaly. Diffuse BILATERAL interstitial and alveolar lung disease not significantly changed from priors. No pneumothorax or effusion. Thoracic atherosclerosis. Bones unremarkable. IMPRESSION: Stable chest. Electronically Signed   By: Staci Righter M.D.   On: 10/26/2018 07:01   Dg Chest Port 1 View  Result Date: 10/22/2018 CLINICAL DATA:  Shortness of breath. EXAM: PORTABLE CHEST 1 VIEW COMPARISON:  Chest x-ray dated October 20, 2018. FINDINGS: Stable cardiomediastinal silhouette. Bilateral diffuse interstitial and alveolar airspace opacities with peripheral predominance again noted, slightly worsened in both upper lobes. No pleural effusion or pneumothorax. No acute osseous abnormality. IMPRESSION: 1. Peripheral predominant bilateral interstitial and alveolar airspace disease, worsened in both upper lobes. Electronically Signed   By: Titus Dubin M.D.   On: 10/22/2018 19:41   Dg Chest Portable 1 View  Result Date: 10/20/2018 CLINICAL DATA:  Shortness of  breath, hypoxia EXAM: PORTABLE CHEST 1 VIEW COMPARISON:  09/24/2018 FINDINGS: Bilateral diffuse interstitial and alveolar airspace opacities with a peripheral predominance. No pleural effusion or pneumothorax. Stable cardiomediastinal silhouette. No aggressive osseous lesion. IMPRESSION: 1. Chronic interstitial lung disease. 2. Increased bilateral interstitial and alveolar airspace opacities with a peripheral predominance. Differential considerations include multilobar pneumonia including atypical infection such as viral pneumonia. 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 daily plan for 2 months slow taper - home with O2 at least for short term  - pulmonary clinic follow up will set up post d/c -Atelectasis - please provide patient with Acapella flutter valve and IS to take home with him. Chest PT today.  -please encourage patient to use every hour several times.        -please continue to work with physical therapy          -cxr in am    Thank you for allowing me to participate in the care of this patient.    Patient/Family are satisfied with care plan and all questions have been answered.  This document was prepared using Dragon voice recognition software and may include unintentional dictation errors.     Ottie Glazier, M.D.  Division of [place]

## 2018-10-28 NOTE — Progress Notes (Signed)
[place] [facility] [place] NAME: Joseph Barton    MR#:  [id number]  DATE OF BIRTH:  [birth year]  SUBJECTIVE:  CHIEF COMPLAINT:   Chief Complaint  Patient presents with  . Weakness   Patient had an episode of chest discomfort yesterday which is resolved.  Troponin was negative.  Twelve-lead EKG unremarkable.  Rested better last night.  No fevers overnight. Patient still requiring 8 L of oxygen via mask this morning.  REVIEW OF SYSTEMS:  Review of Systems  Constitutional: Negative for chills and fever.  HENT: Negative for hearing loss and tinnitus.   Eyes: Negative for blurred vision and double vision.  Respiratory: Positive for cough and shortness of breath.        Shortness of breath improving  Cardiovascular: Negative for chest pain and palpitations.  Gastrointestinal: Negative for abdominal pain, heartburn, nausea and vomiting.  Genitourinary: Negative for dysuria and urgency.  Musculoskeletal: Negative for myalgias and neck pain.  Skin: Negative for itching and rash.  Neurological: Negative for dizziness and headaches.  Psychiatric/Behavioral: Negative for depression and hallucinations.    DRUG ALLERGIES:   Allergies  Allergen Reactions  . Atorvastatin   . Simvastatin    VITALS:  Blood pressure 106/73, pulse 79, temperature 97.7 F (36.5 C), temperature source Oral, resp. rate 20, height 6\' 2"  (1.88 m), weight 77.6 kg, SpO2 93 %. PHYSICAL EXAMINATION:   Physical Exam  Constitutional: He is oriented to person, place, and time. He appears well-developed and well-nourished.  HENT:  Head: Normocephalic and atraumatic.  Right Ear: External ear normal.  Eyes: Pupils are equal, round, and reactive to light. Conjunctivae are normal. Right eye exhibits no discharge. No scleral icterus.  Neck: Normal range of motion. Neck supple. No tracheal deviation present.  Cardiovascular:  Irregularly irregular  Respiratory: Effort normal. He has  rales.  GI: Soft. Bowel sounds are normal. There is no abdominal tenderness.  Musculoskeletal: Normal range of motion.        General: No edema.     Comments: Chronic venous stasis changes to both lower extremity.  Neurological: He is alert and oriented to person, place, and time. No cranial nerve deficit.  Skin: Skin is warm. He is not diaphoretic. No erythema.  Psychiatric: He has a normal mood and affect. His behavior is normal.   LABORATORY PANEL:  Male CBC Recent Labs  Lab 10/26/18 0603  WBC 10.0  HGB 12.9*  HCT 39.1  PLT 190   ------------------------------------------------------------------------------------------------------------------ Chemistries  Recent Labs  Lab 10/26/18 0603 10/27/18 0352  NA 135 135  K 4.3 4.3  CL 100 98  CO2 27 27  GLUCOSE 101* 133*  BUN 35* 36*  CREATININE 1.03 1.17  CALCIUM 8.4* 8.3*  MG 2.2  --    RADIOLOGY:  No results found. ASSESSMENT AND PLAN:  85 year old male patient with a known history of pneumonia in the past, chronic systolic heart failure with EF of 25-30%, coronary artery disease, abdominal aortic aneurysm, hyperlipidemia, hypertension, mitral regurgitation, atrial fibrillation on Coumadin presented to emergency room for shortness of breath and cough.   1.Acute respiratory failure with hypoxia Initially felt to be secondary to pneumonia.  Patient evaluated by infectious disease.  Patient was not responding to antibiotics.  Antibiotics already discontinued since no clinical evidence to support pneumonia. Pneumonia ruled out. Recent CT chest without contrast done for further evaluation revealed interval development of diffuse and interstitial densities throughout both lungs concerning for interstitial or atypical pneumonia  or possibly edema. Bronchiectasis is noted in both lower lobes. 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  Patient has been on amiodarone since January 2020.  Appreciate input from  infectious disease specialist and pulmonologist. Amiodarone already discontinued.   Patient started on prednisone 40 mg p.o. daily with plans for 2 months slow taper.   Infectious disease specialist recommended Bactrim SS 1 tablet daily for PCP prophylaxis since patient is going to be on prednisone for more than 30 days which has been ordered Gradually weaning down oxygen requirement.  Patient was still requiring 5-6 L this morning.   Patient will most likely require home oxygen therapy on discharge.  Patient to follow-up with pulmonary clinic post discharge. Patient will also need Acapella flutter valve and incentive spirometer to take home with him on discharge. [id number] test was negative as such COVID infection ruled out. Pulmonary embolism very unlikely since patient already on anticoagulation with INR supratherapeutic at 4.1- > 2.8  2.Multilobar pneumonia ruled out Has been ruled out.  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  So far no growth on blood cultures. Coronavirus test negative.  COVID 19 ruled out  3.Chronic atrial fibrillation Patient on Coumadin with INR supratherapeutic at 4.1.  Pharmacy assisting with dosing with goal to resume once INR less than 3 Amiodarone discontinued during this admission due to pulmonary toxicity.   cardiologist Dr.  Vilinda Blanks small dose of digoxin and metoprolol for rate control.   Electrophysiologist upon discharge   4. Hypertension Blood pressure controlled on current regimen  Lisinopril discontinued recently due to borderline low blood pressures  5.  Chronic systolic CHF Ejection fraction of 25 to 30%.  Lasix was initially placed on hold on admission due to concern for dehydration. Due to worsening respiratory status, Lasix already resumed  Continue beta-blocker.  Lisinopril on hold  due to blood pressure being borderline recently.  6.  Stuffy nose Patient was seen by ENT in the past. He was taking some decongestant pills over-the-counter at home. On his request I have spoken to ENT physician.  They suggested due to COVID-19 they are not doing any endoscopic procedures currently on elective basis but he suggested to give Afrin spray.  Advised to follow in the clinic if he symptomatically feels better.  DVT prophylaxis; pharmacist dosing Coumadin.    I called patient's spouse listed in chart Ms. Santina Evans again today.  Updated her on patient's clinical condition.  All questions were answered and she is in agreement with the plan of care. Physical therapy has recommended placement to rehab.  Patient is hopeful to be able to go home after some more recovery in the hospital.  All the records are reviewed and case discussed with Care Management/Social Worker. Management plans discussed with the patient, and they are in agreement.  CODE STATUS: DNR  TOTAL TIME TAKING CARE OF THIS PATIENT: 33 minutes.   More than 50% of the time was spent in counseling/coordination of care: YES  POSSIBLE D/C IN 2 DAYS, DEPENDING ON CLINICAL CONDITION.   Vaughan Basta M.D on 10/28/2018 at 4:11 PM  Between 7am to 6pm - Pager - [phone]  After 6pm go to [email or web address] - password [email or web address] [facility]  [employer] [place] [facility]  Office  [phone]  CC: Primary care physician; Jerrol Banana., MD  Note: This dictation was prepared with Dragon dictation along with smaller phrase technology.  Any transcriptional errors that result from this process are unintentional.

## 2018-10-28 NOTE — Progress Notes (Signed)
Physical Therapy Treatment Patient Details Name: Joseph Barton MRN: [id number] DOB: 12/07/1926 Today's Date: 10/28/2018    History of Present Illness  85 y.o. male with a known history of pneumonia in the past, chronic systolic heart failure with EF of 35%, coronary artery disease, abdominal aortic aneurysm, hyperlipidemia, hypertension, mitral regurgitation, atrial fibrillation on Coumadin presented to emergency room for shortness of breath and cough.   Has generalized weakness.  Recently treated for pneumonia (tested (-) for coronavirus)    PT Comments    Patient performs transfer recliner to standing with min assist after 2 attempts and VC for safety and correct hand position. Patient performs gait in room with 5 L o2 and RW height adjusted 3 inches taller with min assist and safety with O2 tubing and reports of fatigue. He has decreased gait speed with RW and during turns needs cues for safety. He will continue to benefit from skilled PT to improve strength and mobility.   Follow Up Recommendations  SNF     Equipment Recommendations  None recommended by PT    Recommendations for Other Services       Precautions / Restrictions Restrictions Weight Bearing Restrictions: No    Mobility  Bed Mobility Overal bed mobility: Needs Assistance                Transfers Overall transfer level: Needs assistance Equipment used: Rolling walker (2 wheeled)             General transfer comment: needs 2 attempts for transfer sit to stand  Ambulation/Gait Ambulation/Gait assistance: Min guard Gait Distance (Feet): 60 Feet Assistive device: Rolling walker (2 wheeled)           Stairs             Wheelchair Mobility    Modified Rankin (Stroke Patients Only)       Balance Overall balance assessment: Mild deficits observed, not formally tested                                          Cognition Arousal/Alertness: Awake/alert Behavior During  Therapy: WFL for tasks assessed/performed                                          Exercises      General Comments        Pertinent Vitals/Pain Pain Assessment: No/denies pain    Home Living                      Prior Function            PT Goals (current goals can now be found in the care plan section) Acute Rehab PT Goals Patient Stated Goal: Go home PT Goal Formulation: With patient Time For Goal Achievement: 11/07/18 Potential to Achieve Goals: Fair Progress towards PT goals: Progressing toward goals    Frequency    Min 2X/week      PT Plan      Co-evaluation              AM-PAC PT "6 Clicks" Mobility   Outcome Measure  Help needed turning from your back to your side while in a flat bed without using bedrails?: None Help needed moving from lying on your back  to sitting on the side of a flat bed without using bedrails?: A Little Help needed moving to and from a bed to a chair (including a wheelchair)?: None Help needed standing up from a chair using your arms (e.g., wheelchair or bedside chair)?: A Little Help needed to walk in hospital room?: A Little Help needed climbing 3-5 steps with a railing? : Total 6 Click Score: 18    End of Session Equipment Utilized During Treatment: Gait belt;Oxygen Activity Tolerance: Patient limited by fatigue Patient left: with call bell/phone within reach;in chair;with chair alarm set Nurse Communication: Mobility status PT Visit Diagnosis: Muscle weakness (generalized) (M62.81);Difficulty in walking, not elsewhere classified (R26.2)     Time: 2671-2458 PT Time Calculation (min) (ACUTE ONLY): 25 min  Charges:  $Therapeutic Activity: 23-37 mins                        Alanson Puls, PT DPT 10/28/2018, 3:15 PM

## 2018-10-28 NOTE — Consult Note (Signed)
[facility] for Warfarin Indication: atrial fibrillation  Patient Measurements: Height: 6\' 2"  (188 cm) Weight: 171 lb 1.6 oz (77.6 kg) IBW/kg (Calculated) : 82.2  Vital Signs: Temp: 97.7 F (36.5 C) (04/27 0721) Temp Source: Oral (04/27 0721) BP: 106/73 (04/27 0721) Pulse Rate: 51 (04/27 0721)  Labs: Recent Labs    10/26/18 0603 10/27/18 0352 10/28/18 0445  HGB 12.9*  --   --   HCT 39.1  --   --   PLT 190  --   --   LABPROT 32.1* 28.8* 25.1*  INR 3.2* 2.8* 2.3*  CREATININE 1.03 1.17  --     Estimated Creatinine Clearance: 46.1 mL/min (by C-G formula based on SCr of 1.17 mg/dL).   Assessment: Pharmacy was consulted to start dosing warfarin. INR increased from 2.6 to 3.3 on 4/23. Patient's home dose is 1.25 mg M-W-F and 2.5 mg T-Th-Sat-Sun. DDI: APAP- inc risk of bleeding, digoxin, amio increase INR, Bactrim started 4/24. Amiodarone was d/c'ed due to toxicity.   Date INR Warfarin Dose  4/22 2.6 1.25 mg  4/23 3.3 Hold  4/24 4.1 Hold  4/25 3.2 Hold  4/26 2.8 1 mg  4/27 2.3     Goal of Therapy:  INR 2-3 Monitor platelets by anticoagulation protocol: Yes   Plan:  INR still trending down but is within goal range.  Will order reduced dose warfarin 1.5 mg for tonight   Daily INR ordered. CBC has been stable. Monitor for any sign or symptoms of bleeding.   Pharmacy will continue to follow.   Dallie Piles, PharmD Clinical Pharmacist  10/28/2018,8:20 AM

## 2018-10-28 NOTE — Progress Notes (Signed)
Progress Note  Patient Name: Joseph Barton Date of Encounter: 10/28/2018  Primary Cardiologist: Ida Rogue, MD   Subjective   Patient denied chest pain, palpitations, or feeling of racing heart rate.  He continues to report shortness of breath while on current breathing treatment.  He did not sleep well last night, which she suspects may be due to his breathing.  Inpatient Medications    Scheduled Meds: . digoxin  0.0625 mg Oral Daily  . docusate sodium  200 mg Oral BID  . fluticasone  1 spray Each Nare Daily  . furosemide  40 mg Oral Daily  . ipratropium-albuterol  3 mL Nebulization TID  . metoprolol succinate  25 mg Oral Daily  . multivitamin with minerals  1 tablet Oral Daily  . pantoprazole  40 mg Oral Daily  . polyethylene glycol  17 g Oral Daily  . predniSONE  40 mg Oral Q breakfast  . sodium chloride flush  3 mL Intravenous Q12H  . sulfamethoxazole-trimethoprim  1 tablet Oral Daily  . tamsulosin  0.4 mg Oral Daily  . warfarin  1.5 mg Oral ONCE-1800  . Warfarin - Pharmacist Dosing Inpatient   Does not apply q1800   Continuous Infusions: . sodium chloride Stopped (10/22/18 1845)  . sodium chloride     PRN Meds: sodium chloride, acetaminophen, albuterol, benzonatate, guaiFENesin-dextromethorphan, nitroGLYCERIN, sodium chloride flush, traZODone   Vital Signs    Vitals:   10/28/18 0300 10/28/18 0322 10/28/18 0721 10/28/18 0856  BP:  98/73 106/73   Pulse:  62 (!) 51 79  Resp:  (!) 24    Temp:  (!) 97.4 F (36.3 C) 97.7 F (36.5 C)   TempSrc:  Oral Oral   SpO2:  94% 93%   Weight: 77.6 kg     Height:        Intake/Output Summary (Last 24 hours) at 10/28/2018 0948 Last data filed at 10/28/2018 0721 Gross per 24 hour  Intake -  Output 950 ml  Net -950 ml   Filed Weights   10/26/18 0439 10/27/18 0505 10/28/18 0300  Weight: 76.5 kg 76.3 kg 77.6 kg    Telemetry    IR IR, short runs of NSVT this admission- Personally Reviewed  ECG    No new  tracings- Personally Reviewed  Physical Exam   GEN: No acute distress.   Neck: No JVD Cardiac: IRIR, 1/6 systolic murmurs, rubs, or gallops.  Respiratory:  Decreased bilateral breath sounds/air flow, decreased respiratory effort.  Left side breath sounds worse than that of right. GI: Soft, nontender, non-distended  MS: No bilateral lower extremity edema; No deformity. Neuro:  Nonfocal  Psych: Normal affect   Labs    Chemistry Recent Labs  Lab 10/25/18 0543 10/26/18 0603 10/27/18 0352  NA 134* 135 135  K 4.7 4.3 4.3  CL 99 100 98  CO2 _0 GLUCOSE 103* 101* 133*  BUN 36* 35* 36*  CREATININE 1.04 1.03 1.17  CALCIUM 8.4* 8.4* 8.3*  GFRNONAA >60 >60 55*  GFRAA >60 >60 >60  ANIONGAP _1 Hematology Recent Labs  Lab 10/24/18 0331 10/25/18 0543 10/26/18 0603  WBC 14.7* 11.0* 10.0  RBC 4.06* 4.20* 4.25  HGB 12.1* 12.6* 12.9*  HCT 36.6* 38.2* 39.1  MCV 90.1 91.0 92.0  MCH 29.8 30.0 30.4  MCHC 33.1 33.0 33.0  RDW 14.1 14.3 14.3  PLT 206 194 190    Cardiac Enzymes Recent Labs  Lab 10/22/18 0300 10/24/18  [place] <0.03 <0.03   No results for input(s): TROPIPOC in the last 168 hours.   BNP Recent Labs  Lab 10/26/18 0603  BNP 354.0*     DDimer No results for input(s): DDIMER in the last 168 hours.   Radiology    No results found.  Cardiac Studies   TTE 10/23/2018  1. The left ventricle has severely reduced systolic function, with an ejection fraction of 25-30%. The cavity size was normal.Global hypokinesis, basal wall motion best preserved. Severe hypokinesis of the anterior, anteroseptal and apical regions.  Unable to determine diastolic parameters.  2. The right ventricle has normal systolic function. The cavity was mildly enlarged. There is no increase in right ventricular wall thickness. Right ventricular systolic pressure is moderately elevated with an estimated pressure of 50.7 mmHg.  3. Left atrial size was moderately dilated.   4. Right atrial size was moderately dilated.  5. Rhythm possibly atrial fibrillation  Patient Profile     85 y.o. male hx of permanent Afib (Coumadin), HFrEF d/t ICM (EF 62%) and complicated by moderate-severe MR, CAD with remote PCI to LAD (2005), AAA (2005) s/p repair (2006), HLD (noted statin intolerance), DM2, HTN, neuropathy, and pulmonary nodules , pneumonia December into January 2020 with difficult to control atrial fibrillation, nonsustained VT , presenting with acute respiratory distress.  Assessment & Plan    Permanent A. fib with RVR -No racing HR, palpitations. Continued SOB with known COPD and chronic pulmonary issues, as well as CHF and MR. - CHA2DS2VASc score of at least 12 (age x2, vascular, HFrEF, hypertension, DM2 with A1C last 6.5). Continue Coumadin for anticoagulation. Monitor INR with last INR 2.3. Daily CBC. Followed by Dr. Caryl Comes. - Continue low-dose beta-blocker with Toprol for rate control and as breathing allows. Rates this morning are well controlled. As documented, earlier episode of NSVT with Toprol increased to 25 mg/day. Titrate as BP allows with most recent pressures soft. 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  Continue digoxin 0.0625 mg daily with close monitoring of renal function and electrolytes.  - Pending BMET for today -no results available now, but will want to monitor given bump in Cr noted from 4/25  4/26.   NSVT - Runs of NSVT are reportedly asymptomatic. Continue to monitor on telemetry. Continue BB as BP allows. H/o frequent runs of NSVT. Consider further input from EP.  Acute on chronic systolic heart failure - Shortness of breath with etiology likely multifactorial as above. Reported desaturation with activity and overnight. - Previously denied mitral clip intervention in the past with known MR.  - Updated echo as above shows EF 25-30%. Soft BP with renal function reduced earlier in admission  and likely not a good candidate for ACE/ARB/Entresto at this time.  - Continue oral lasix 53m daily with close monitoring of renal function given earlier reduced renal function. Continue digoxin as above. Daily BMET. Pending today's BMET with diuresis to be adjusted based on renal function / results. Continue to monitor I's/O, daily weights.  Weight today increased from 168lbs   171 lbs. Almost -7L for admission with -700cc yesterday.   CAD  - No chest pain. Troponin negative. Nitro as needed. Continue BB. Not on ASA d/t coumadin.  No ACE/ARB/Entresto given renal function and low BP. - Poor candidate for further ischemic or invasive workup.  Acute respiratory distress - Likely multifactorial as above - Breathing treatments, nebs, and oxygen per IM  For questions or  updates, please contact [place] Please consult [email or web address] for contact info under        Signed, Arvil Chaco, PA-C  10/28/2018, 9:48 AM

## 2018-10-29 ENCOUNTER — Inpatient Hospital Stay: Payer: Medicare Other

## 2018-10-29 ENCOUNTER — Encounter: Payer: Self-pay | Admitting: *Deleted

## 2018-10-29 ENCOUNTER — Ambulatory Visit: Payer: Self-pay

## 2018-10-29 DIAGNOSIS — J9621 Acute and chronic respiratory failure with hypoxia: Secondary | ICD-10-CM

## 2018-10-29 DIAGNOSIS — J9601 Acute respiratory failure with hypoxia: Secondary | ICD-10-CM

## 2018-10-29 LAB — CBC
HCT: 41.3 % (ref 39.0–52.0)
Hemoglobin: 13.6 g/dL (ref 13.0–17.0)
MCH: 30.2 pg (ref 26.0–34.0)
MCHC: 32.9 g/dL (ref 30.0–36.0)
MCV: 91.6 fL (ref 80.0–100.0)
Platelets: 161 10*3/uL (ref 150–400)
RBC: 4.51 MIL/uL (ref 4.22–5.81)
RDW: 14.1 % (ref 11.5–15.5)
WBC: 9.9 10*3/uL (ref 4.0–10.5)
nRBC: 0 % (ref 0.0–0.2)

## 2018-10-29 LAB — PROTIME-INR
INR: 2.2 — ABNORMAL HIGH (ref 0.8–1.2)
Prothrombin Time: 24.2 seconds — ABNORMAL HIGH (ref 11.4–15.2)

## 2018-10-29 LAB — BASIC METABOLIC PANEL
Anion gap: 9 (ref 5–15)
BUN: 32 mg/dL — ABNORMAL HIGH (ref 8–23)
CO2: 28 mmol/L (ref 22–32)
Calcium: 8.7 mg/dL — ABNORMAL LOW (ref 8.9–10.3)
Chloride: 97 mmol/L — ABNORMAL LOW (ref 98–111)
Creatinine, Ser: 1.09 mg/dL (ref 0.61–1.24)
GFR calc Af Amer: >60 mL/min (ref >60)
GFR calc non Af Amer: 59 mL/min — ABNORMAL LOW (ref >60)
Glucose, Bld: 128 mg/dL — ABNORMAL HIGH (ref 70–99)
Potassium: 4.8 mmol/L (ref 3.5–5.1)
Sodium: 134 mmol/L — ABNORMAL LOW (ref 135–145)

## 2018-10-29 MED ORDER — DIPHENHYDRAMINE HCL 25 MG PO CAPS
25.0000 mg | ORAL_CAPSULE | Freq: Three times a day (TID) | ORAL | Status: DC | PRN
  Administered 2018-10-29 – 2018-10-30 (×2): 25 mg via ORAL
  Filled 2018-10-29 (×2): qty 1

## 2018-10-29 MED ORDER — WARFARIN SODIUM 1 MG PO TABS
1.5000 mg | ORAL_TABLET | Freq: Once | ORAL | Status: AC
  Administered 2018-10-29: 1.5 mg via ORAL
  Filled 2018-10-29: qty 1

## 2018-10-29 MED ORDER — ALBUTEROL SULFATE (2.5 MG/3ML) 0.083% IN NEBU: 2.5000 mg | INHALATION_SOLUTION | RESPIRATORY_TRACT | Status: DC | PRN

## 2018-10-29 NOTE — Progress Notes (Signed)
Progress Note  Patient Name: Joseph Barton Date of Encounter: 10/29/2018  Primary Cardiologist: Ida Rogue, MD   Subjective   Patient reporting improvement in breathing today on [place] oxygen. He stated his "stuffy nose" has improved significantly from yesterday.  He has not yet had a chance to sit on the side of his bed or ambulate around the room but is eager to do both.   He denied CP, palpitations, or feeling racing heart rate.   He slept three hours last night, which is an improvement from the previous night.  Inpatient Medications    Scheduled Meds: . digoxin  0.0625 mg Oral Daily  . docusate sodium  200 mg Oral BID  . fluticasone  1 spray Each Nare Daily  . furosemide  40 mg Oral Daily  . ipratropium-albuterol  3 mL Nebulization TID  . metoprolol succinate  25 mg Oral Daily  . multivitamin with minerals  1 tablet Oral Daily  . oxymetazoline  3 spray Each Nare BID  . pantoprazole  40 mg Oral Daily  . polyethylene glycol  17 g Oral Daily  . predniSONE  40 mg Oral Q breakfast  . sodium chloride flush  3 mL Intravenous Q12H  . sulfamethoxazole-trimethoprim  1 tablet Oral Daily  . tamsulosin  0.4 mg Oral Daily  . warfarin  1.5 mg Oral ONCE-1800  . Warfarin - Pharmacist Dosing Inpatient   Does not apply q1800   Continuous Infusions: . sodium chloride Stopped (10/22/18 1845)  . sodium chloride     PRN Meds: sodium chloride, acetaminophen, albuterol, benzonatate, bisacodyl, guaiFENesin-dextromethorphan, nitroGLYCERIN, sodium chloride flush, traZODone   Vital Signs    Vitals:   10/28/18 2141 10/29/18 0534 10/29/18 0715 10/29/18 0716  BP:  116/67  101/70  Pulse: 60 (!) 58 (!) 55 (!) 55  Resp:  '18 18 19  '$ Temp:  (!) 97.5 F (36.4 C)  (!) 97.5 F (36.4 C)  TempSrc:  Oral  Oral  SpO2: 90% 92% 93% 93%  Weight:  77.5 kg    Height:        Intake/Output Summary (Last 24 hours) at 10/29/2018 0816 Last data filed at 10/28/2018 2136 Gross per 24 hour  Intake 243 ml   Output 475 ml  Net -232 ml   Filed Weights   10/27/18 0505 10/28/18 0300 10/29/18 0534  Weight: 76.3 kg 77.6 kg 77.5 kg    Telemetry    IRIR, mid 50-low 60s, earlier NSVT this admission- Personally Reviewed  ECG    No new tracings- Personally Reviewed  Physical Exam   GEN: No acute distress.   Neck: No JVD Cardiac: IRIR, 1/6 systolic murmur; no rubs or gallops.  Respiratory:  Decreased but improving bilateral breath sounds and bibasilar crackles GI: Soft, nontender, non-distended  MS: No bilateral lower extremity edema; No deformity. Neuro:  Nonfocal  Psych: Normal affect   Labs    Chemistry Recent Labs  Lab 10/26/18 0603 10/27/18 0352 10/29/18 0512  NA 135 135 134*  K 4.3 4.3 4.8  CL 100 98 97*  CO2 '27 27 28  '$ GLUCOSE 101* 133* 128*  BUN 35* 36* 32*  CREATININE 1.03 1.17 1.09  CALCIUM 8.4* 8.3* 8.7*  GFRNONAA >60 55* 59*  GFRAA >60 >60 >60  ANIONGAP '8 10 9     '$ Hematology Recent Labs  Lab 10/25/18 0543 10/26/18 0603 10/29/18 0512  WBC 11.0* 10.0 9.9  RBC 4.20* 4.25 4.51  HGB 12.6* 12.9* 13.6  HCT 38.2* 39.1 41.3  MCV 91.0 92.0 91.6  MCH 30.0 30.4 30.2  MCHC 33.0 33.0 32.9  RDW 14.3 14.3 14.1  PLT 194 190 161    Cardiac Enzymes Recent Labs  Lab 10/24/18 1759  TROPONINI <0.03   No results for input(s): TROPIPOC in the last 168 hours.   BNP Recent Labs  Lab 10/26/18 0603  BNP 354.0*     DDimer No results for input(s): DDIMER in the last 168 hours.   Radiology    Dg Chest Port 1 View  Result Date: 10/29/2018 CLINICAL DATA:  Shortness of breath. EXAM: PORTABLE CHEST 1 VIEW COMPARISON:  Radiograph of October 26, 2018. FINDINGS: Stable cardiomediastinal silhouette. Atherosclerosis of thoracic aorta is noted. No pneumothorax or pleural effusion is noted. Stable bilateral diffuse airspace and interstitial densities are noted throughout both lungs concerning for atypical infection or edema. Bony thorax unremarkable. IMPRESSION: Stable bilateral  lung opacities as described above. Aortic Atherosclerosis (ICD10-I70.0). Electronically Signed   By: Marijo Conception M.D.   On: 10/29/2018 07:56    Cardiac Studies   TTE 10/23/2018  1. The left ventricle has severely reduced systolic function, with an ejection fraction of 25-30%. The cavity size was normal.Global hypokinesis, basal wall motion best preserved. Severe hypokinesis of the anterior, anteroseptal and apical regions.  Unable to determine diastolic parameters.  2. The right ventricle has normal systolic function. The cavity was mildly enlarged. There is no increase in right ventricular wall thickness. Right ventricular systolic pressure is moderately elevated with an estimated pressure of 50.7 mmHg.  3. Left atrial size was moderately dilated.  4. Right atrial size was moderately dilated.  5. Rhythm possibly atrial fibrillation  Patient Profile     85 y.o. male hx of permanent Afib (Coumadin), HFrEF d/t ICM (EF 75%) and complicated by moderate-severe MR, CAD with remote PCI to LAD (2005), AAA (2005) s/p repair (2006), HLD (noted statin intolerance), DM2, HTN, neuropathy, and pulmonary nodules, pneumonia December into January 2020 with difficult to control atrial fibrillation, nonsustained VT , presenting with acute respiratory distress.  Assessment & Plan    Permanent A. fib with RVR -Improved SOB. No CP, palpitations. Rates well controlled at rest. Not yet ambulated. 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  He should remain off of amiodarone.  - Continue increased dose of Toprol '25mg'$  daily at discharge for rate control with hold parameters for low HR and BP. Rate is well controlled at this time. -  Continue po digoxin 0.0625 mg daily with close monitoring of renal function and electrolytes. Daily BMET. -  Continue Coumadin. 4/28 INR 2.2. Daily CBC. CHA2DS2VASc score of at least 19 (age x2, vascular, HFrEF,  hypertension, DM2 with A1C last 6.5) with recommendation for long term anticoagulation. Continue INR checks as an outpatient. - Recommend that before discharge monitor rates and oxygen saturation with activity / ambulation, given improvement in breathing status at rest.   NSVT - Runs of NSVT are reportedly asymptomatic. Continue to monitor on telemetry. Continue BB as above.   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 Previously denied mitral clip intervention in the past with known MR.  - Appears euvolemic on exam today. On [place] oxygen. Updated echo as above shows EF 25-30%; however, escalation of evidence based heart failure medication has been limited by BP and renal function this  admission.  - Continue to hold ACEi / lisinopril 2.'5mg'$ . Plan for restart as an outpatient and with stable BP and BMET recheck to ensure continued improvement in renal function.   - Continue PTA oral lasix '40mg'$  daily at discharge and with close monitoring of renal function and electrolytes. Continue to monitor fluids, salt intake, and daily weights after discharge.  - Continue to hold PTA potassium supplementation at discharge with follow-up BMET after discharge and given the below with K 4.8 today.  - Continue digoxin as above. Cr 1.17  1.09; BUN 36  32 - Will need close monitoring / follow-up labs as an outpatient  Hyperkalemia - K 4.8. Monitor. Patient will need renal function and electrolytes checked prior to restart of ACEi as an outpatient  CAD  - No chest pain. Troponin negative. Nitro as needed. Continue BB as above. Not on ASA d/t coumadin. As above, holding ACEi. Not on Entresto given earlier renal function and low BP this admission. - Poor candidate for further ischemic or invasive workup.  Acute respiratory distress - Likely multifactorial as above.  Significantly improved from admission. - Breathing treatments, nebs, and oxygen per IM  For questions or updates, please contact [place] Please consult [email or web address] for contact info under        Signed, Arvil Chaco, PA-C  10/29/2018, 8:16 AM

## 2018-10-29 NOTE — Progress Notes (Signed)
[facility] [place] NAME: Joseph Barton    MR#:  [id number]  DATE OF BIRTH:  [birth year]  SUBJECTIVE:  CHIEF COMPLAINT:   Chief Complaint  Patient presents with  . Weakness   Patient had an episode of chest discomfort yesterday which is resolved.  Troponin was negative.  Twelve-lead EKG unremarkable.  Rested better last night.  No fevers overnight. Complaint of stuffy nose yesterday but after starting Afrin spray he is feeling much better and on 4 L oxygen via nasal cannula now.  REVIEW OF SYSTEMS:  Review of Systems  Constitutional: Negative for chills and fever.  HENT: Negative for hearing loss and tinnitus.   Eyes: Negative for blurred vision and double vision.  Respiratory: Positive for cough and shortness of breath.        Shortness of breath improving  Cardiovascular: Negative for chest pain and palpitations.  Gastrointestinal: Negative for abdominal pain, heartburn, nausea and vomiting.  Genitourinary: Negative for dysuria and urgency.  Musculoskeletal: Negative for myalgias and neck pain.  Skin: Negative for itching and rash.  Neurological: Negative for dizziness and headaches.  Psychiatric/Behavioral: Negative for depression and hallucinations.   DRUG ALLERGIES:   Allergies  Allergen Reactions  . Atorvastatin   . Simvastatin    VITALS:  Blood pressure 101/70, pulse 63, temperature (!) 97.5 F (36.4 C), temperature source Oral, resp. rate 18, height 6\' 2"  (1.88 m), weight 77.5 kg, SpO2 94 %. PHYSICAL EXAMINATION:   Physical Exam  Constitutional: He is oriented to person, place, and time. He appears well-developed and well-nourished.  HENT:  Head: Normocephalic and atraumatic.  Right Ear: External ear normal.  Eyes: Pupils are equal, round, and reactive to light. Conjunctivae are normal. Right eye exhibits no discharge. No scleral icterus.  Neck: Normal range of motion. Neck supple. No tracheal deviation present.   Cardiovascular:  Irregularly irregular  Respiratory: Effort normal. He has rales.  GI: Soft. Bowel sounds are normal. There is no abdominal tenderness.  Musculoskeletal: Normal range of motion.        General: No edema.     Comments: Chronic venous stasis changes to both lower extremity.  Neurological: He is alert and oriented to person, place, and time. No cranial nerve deficit.  Skin: Skin is warm. He is not diaphoretic. No erythema.  Psychiatric: He has a normal mood and affect. His behavior is normal.   LABORATORY PANEL:  Male CBC Recent Labs  Lab 10/29/18 0512  WBC 9.9  HGB 13.6  HCT 41.3  PLT 161   ------------------------------------------------------------------------------------------------------------------ Chemistries  Recent Labs  Lab 10/26/18 0603  10/29/18 0512  NA 135   < > 134*  K 4.3   < > 4.8  CL 100   < > 97*  CO2 27   < > 28  GLUCOSE 101*   < > 128*  BUN 35*   < > 32*  CREATININE 1.03   < > 1.09  CALCIUM 8.4*   < > 8.7*  MG 2.2  --   --    < > = values in this interval not displayed.   RADIOLOGY:  Dg Chest Port 1 View  Result Date: 10/29/2018 CLINICAL DATA:  Shortness of breath. EXAM: PORTABLE CHEST 1 VIEW COMPARISON:  Radiograph of October 26, 2018. FINDINGS: Stable cardiomediastinal silhouette. Atherosclerosis of thoracic aorta is noted. No pneumothorax or pleural effusion is noted. Stable bilateral diffuse airspace and interstitial densities are noted throughout both lungs concerning for  atypical infection or edema. Bony thorax unremarkable. IMPRESSION: Stable bilateral lung opacities as described above. Aortic Atherosclerosis (ICD10-I70.0). Electronically Signed   By: Marijo Conception M.D.   On: 10/29/2018 07:56   ASSESSMENT AND PLAN:  85 year old male patient with a known history of pneumonia in the past, chronic systolic heart failure with EF of 25-30%, coronary artery disease, abdominal aortic aneurysm, hyperlipidemia, hypertension, mitral  regurgitation, atrial fibrillation on Coumadin presented to emergency room for shortness of breath and cough.   1.Acute respiratory failure with hypoxia Initially felt to be secondary to pneumonia.  Patient evaluated by infectious disease.  Patient was not responding to antibiotics.  Antibiotics already discontinued since no clinical evidence to support pneumonia. Pneumonia ruled out. Recent CT chest without contrast done for further evaluation revealed interval development of diffuse and interstitial densities throughout both lungs concerning for interstitial or atypical pneumonia or possibly edema. Bronchiectasis is noted in both lower lobes. 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  Patient has been on amiodarone since January 2020.  Appreciate input from infectious disease specialist and pulmonologist. Amiodarone already discontinued.   Patient started on prednisone 40 mg p.o. daily with plans for 2 months slow taper.   Infectious disease specialist recommended Bactrim SS 1 tablet daily for PCP prophylaxis since patient is going to be on prednisone for more than 30 days which has been ordered Gradually weaning down oxygen requirement.  Patient was still requiring 5-6 L this morning.   Patient will most likely require home oxygen therapy on discharge.  Patient to follow-up with pulmonary clinic post discharge. Patient will also need Acapella flutter valve and incentive spirometer to take home with him on discharge. [id number] test was negative as such COVID infection ruled out. Pulmonary embolism very unlikely since patient already on anticoagulation with INR supratherapeutic at 4.1- > 2.8 > 2.2  2.Multilobar pneumonia ruled out Has been ruled out.  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  So far no growth on blood  cultures. Coronavirus test negative.  COVID 19 ruled out  3.Chronic atrial fibrillation Patient on Coumadin with INR supratherapeutic at 4.1.  Pharmacy assisting with dosing as INR < 2.5 now. Amiodarone discontinued during this admission due to pulmonary toxicity.   cardiologist Dr.  Vilinda Blanks small dose of digoxin and metoprolol for rate control.   Electrophysiologist upon discharge   4. Hypertension Blood pressure controlled on current regimen  Lisinopril discontinued recently due to borderline low blood pressures  5.  Chronic systolic CHF Ejection fraction of 25 to 30%.  Lasix was initially placed on hold on admission due to concern for dehydration. Due to worsening respiratory status, Lasix already resumed  Continue beta-blocker.  Lisinopril on hold due to blood pressure being borderline recently.  6.  Stuffy nose Patient was seen by ENT in the past. He was taking some decongestant pills over-the-counter at home. On his request I have spoken to ENT physician.  They suggested due to COVID-19 they are not doing any endoscopic procedures currently on elective basis but he suggested to give Afrin spray.  Advised to follow in the clinic if he symptomatically feels better. Patient is already feeling better just by 1 day of Afrin spray.  DVT prophylaxis; pharmacist dosing Coumadin.     Physical therapy has recommended placement to rehab.  Patient is hopeful to be able to go home after some more recovery  in the hospital.  All the records are reviewed and case discussed with Care Management/Social Worker. Management plans discussed with the patient, and they are in agreement.  CODE STATUS: DNR  TOTAL TIME TAKING CARE OF THIS PATIENT: 33 minutes.   More than 50% of the time was spent in counseling/coordination of care: YES  POSSIBLE D/C IN 2 DAYS, DEPENDING ON CLINICAL CONDITION.   Vaughan Basta M.D on 10/29/2018 at 3:06 PM  Between 7am to 6pm - Pager - [phone]   After 6pm go to [email or web address] - password [email or web address] [facility]  [employer] [place] [facility]  Office  [phone]  CC: Primary care physician; Jerrol Banana., MD  Note: This dictation was prepared with Dragon dictation along with smaller phrase technology. Any transcriptional errors that result from this process are unintentional.

## 2018-10-29 NOTE — Progress Notes (Signed)
Pulmonary Medicine          Date: 10/29/2018,   MRN# [id number] MADEX SEALS 11-20-1926     AdmissionWeight: 83 kg                 CurrentWeight: 77.5 kg      CHIEF COMPLAINT:   Acute hypoxemic respiratory failure   SUBJECTIVE   Patient reports clinical improvement, have been able to wean down O2 to 4 L nasal cannula.     PAST MEDICAL HISTORY   Past Medical History:  Diagnosis Date  . Abdominal aortic aneurysm ([place]) 01-2004   4.7 x 4.7 cm.  Marland Kitchen CAD (coronary artery disease) 01/25/2004   a. 01/2004 Ant MI/DES to LAD;  b. 10/2011 Neg MV.  . Chronic systolic heart failure ([place])    a. 08/2016 Echo: EF 35%, nl RV fxn, mod to sev MR, mild to mod TR, mod biatrial enlargement.  . Hyperlipidemia   . Hypertension   . Ischemic cardiomyopathy    a. 08/2016 Echo: EF 35%.  . Moderate to Severe Mitral regurgitation    a. 08/2016 Echo: Mod-sev MR.  Marland Kitchen Permanent atrial fibrillation    a. Chronic coumadin (CHA2DS2VASc = 5).  . Pulmonary nodules    Noted on abdominal CT  . Statin intolerance      SURGICAL HISTORY   Past Surgical History:  Procedure Laterality Date  . ABDOMINAL AORTIC ANEURYSM REPAIR     12/02/2007 [facility]  . ABDOMINAL AORTIC ANEURYSM REPAIR  2006   [facility]   . CARDIAC CATHETERIZATION  2009   [facility]  . CORONARY ANGIOPLASTY WITH STENT PLACEMENT  2005   Cypher stent LAD   . Cypher stents to LAD     01/25/2004  . SKIN SURGERY  2016   [facility] skin cancer     FAMILY HISTORY   Family History  Problem Relation Age of Onset  . Heart attack Father 30       Died w/ Heart attack  . Diabetes Father   . Heart attack Brother   . Heart disease Brother   . Diabetes Sister   . Heart disease Sister      SOCIAL HISTORY   Social History   Tobacco Use  . Smoking status: Former Smoker    Last attempt to quit: 12/01/1987    Years since quitting: 30.9  . Smokeless tobacco: Never Used  Substance Use Topics  . Alcohol use: No  . Drug use: No      MEDICATIONS    Home Medication:    Current Medication:  Current Facility-Administered Medications:  .  0.9 %  sodium chloride infusion, 250 mL, Intravenous, PRN, Saundra Shelling, MD, Stopped at 10/22/18 1845 .  0.9 %  sodium chloride infusion, , Intravenous, Continuous, Ojie, Jude, MD .  acetaminophen (TYLENOL) tablet 650 mg, 650 mg, Oral, Q6H PRN, Ojie, Jude, MD, 650 mg at 10/26/18 1114 .  albuterol (PROVENTIL) (2.5 MG/3ML) 0.083% nebulizer solution 2.5 mg, 2.5 mg, Inhalation, Q6H PRN, Pyreddy, Pavan, MD, 2.5 mg at 10/28/18 0208 .  benzonatate (TESSALON) capsule 100 mg, 100 mg, Oral, TID PRN, Stark Jock, Jude, MD, 100 mg at 10/28/18 0908 .  bisacodyl (DULCOLAX) EC tablet 5 mg, 5 mg, Oral, Daily PRN, Seals, Angela H, NP, 5 mg at 10/28/18 2136 .  digoxin (LANOXIN) tablet 0.0625 mg, 0.0625 mg, Oral, Daily, Pyreddy, Pavan, MD, 0.0625 mg at 10/29/18 0817 .  docusate sodium (COLACE) capsule 200 mg, 200 mg, Oral, BID, Marcille Blanco,  Norva Riffle, MD, 200 mg at 10/29/18 0816 .  fluticasone (FLONASE) 50 MCG/ACT nasal spray 1 spray, 1 spray, Each Nare, Daily, Seals, Angela H, NP, 1 spray at 10/29/18 0818 .  furosemide (LASIX) tablet 40 mg, 40 mg, Oral, Daily, Vaughan Basta, MD, 40 mg at 10/29/18 0817 .  guaiFENesin-dextromethorphan (ROBITUSSIN DM) 100-10 MG/5ML syrup 5 mL, 5 mL, Oral, Q4H PRN, Ojie, Jude, MD, 5 mL at 10/28/18 0908 .  ipratropium-albuterol (DUONEB) 0.5-2.5 (3) MG/3ML nebulizer solution 3 mL, 3 mL, Nebulization, TID, Ojie, Jude, MD, 3 mL at 10/29/18 0715 .  metoprolol succinate (TOPROL-XL) 24 hr tablet 25 mg, 25 mg, Oral, Daily, Dorris Carnes V, MD, 25 mg at 10/29/18 9833 .  multivitamin with minerals tablet 1 tablet, 1 tablet, Oral, Daily, Ojie, Jude, MD, 1 tablet at 10/29/18 0817 .  nitroGLYCERIN (NITROSTAT) SL tablet 0.4 mg, 0.4 mg, Sublingual, Q5 min PRN, Ojie, Jude, MD .  oxymetazoline (AFRIN) 0.05 % nasal spray 3 spray, 3 spray, Each Nare, BID, Vaughan Basta, MD, 3 spray at  10/29/18 0818 .  pantoprazole (PROTONIX) EC tablet 40 mg, 40 mg, Oral, Daily, Pyreddy, Pavan, MD, 40 mg at 10/29/18 0817 .  polyethylene glycol (MIRALAX / GLYCOLAX) packet 17 g, 17 g, Oral, Daily, Harrie Foreman, MD, 17 g at 10/29/18 0816 .  predniSONE (DELTASONE) tablet 40 mg, 40 mg, Oral, Q breakfast, Vitaly Wanat, MD, 40 mg at 10/29/18 0817 .  sodium chloride flush (NS) 0.9 % injection 3 mL, 3 mL, Intravenous, Q12H, Pyreddy, Pavan, MD, 3 mL at 10/29/18 0818 .  sodium chloride flush (NS) 0.9 % injection 3 mL, 3 mL, Intravenous, PRN, Pyreddy, Pavan, MD, 3 mL at 10/24/18 0641 .  sulfamethoxazole-trimethoprim (BACTRIM) 400-80 MG per tablet 1 tablet, 1 tablet, Oral, Daily, Ojie, Jude, MD, 1 tablet at 10/29/18 0817 .  tamsulosin (FLOMAX) capsule 0.4 mg, 0.4 mg, Oral, Daily, Pyreddy, Pavan, MD, 0.4 mg at 10/29/18 0816 .  traZODone (DESYREL) tablet 50 mg, 50 mg, Oral, QHS PRN, Mayo, Pete Pelt, MD, 50 mg at 10/26/18 2219 .  warfarin (COUMADIN) tablet 1.5 mg, 1.5 mg, Oral, ONCE-1800, Dallie Piles, RPH .  Warfarin - Pharmacist Dosing Inpatient, , Does not apply, q1800, Ojie, Jude, MD    ALLERGIES   Atorvastatin and Simvastatin     REVIEW OF SYSTEMS    Review of Systems:  Gen:  Denies  fever, sweats, chills weigh loss  HEENT: Denies blurred vision, double vision, ear pain, eye pain, hearing loss, nose bleeds, sore throat Cardiac:  No dizziness, chest pain or heaviness, chest tightness,edema Resp:   Denies cough or sputum porduction, shortness of breath,wheezing, hemoptysis,  Gi: Denies swallowing difficulty, stomach pain, nausea or vomiting, diarrhea, constipation, bowel incontinence Gu:  Denies bladder incontinence, burning urine Ext:   Denies Joint pain, stiffness or swelling Skin: Denies  skin rash, easy bruising or bleeding or hives Endoc:  Denies polyuria, polydipsia , polyphagia or weight change Psych:   Denies depression, insomnia or hallucinations   Other:  All other  systems negative   VS: BP 101/70 (BP Location: Right Arm)   Pulse (!) 55   Temp (!) 97.5 F (36.4 C) (Oral)   Resp 19   Ht 6\' 2"  (1.88 m)   Wt 77.5 kg   SpO2 (!) 89%   BMI 21.94 kg/m      PHYSICAL EXAM    GENERAL:NAD, no fevers, chills, no weakness no fatigue HEAD: Normocephalic, atraumatic.  EYES: Pupils equal, round, reactive to light. Extraocular  muscles intact. No scleral icterus.  MOUTH: Moist mucosal membrane. Dentition intact. No abscess noted.  EAR, NOSE, THROAT: Clear without exudates. No external lesions.  NECK: Supple. No thyromegaly. No nodules. No JVD.  PULMONARY: Decreased breath sounds bilaterally, Velcro crepitations at the bases bilaterally CARDIOVASCULAR: S1 and S2. Regular rate and rhythm. No murmurs, rubs, or gallops. No edema. Pedal pulses 2+ bilaterally.  GASTROINTESTINAL: Soft, nontender, nondistended. No masses. Positive bowel sounds. No hepatosplenomegaly.  MUSCULOSKELETAL: No swelling, clubbing, or edema. Range of motion full in all extremities.  NEUROLOGIC: Cranial nerves II through XII are intact. No gross focal neurological deficits. Sensation intact. Reflexes intact.  SKIN: No ulceration, lesions, rashes, or cyanosis. Skin warm and dry. Turgor intact.  PSYCHIATRIC: Mood, affect within normal limits. The patient is awake, alert and oriented x 3. Insight, judgment intact.       IMAGING    Ct Chest Wo Contrast  Result Date: 10/23/2018 CLINICAL DATA:  Cough, shortness of breath. EXAM: CT CHEST WITHOUT CONTRAST TECHNIQUE: Multidetector CT imaging of the chest was performed following the standard protocol without IV contrast. COMPARISON:  Radiograph of October 22, 2018. CT scan of July 15, 2018. FINDINGS: Cardiovascular: Atherosclerosis of thoracic aorta is noted without aneurysm formation. Normal cardiac size. Coronary artery calcifications are noted. No pericardial effusion. Mediastinum/Nodes: Thyroid gland and esophagus are unremarkable. Mildly  enlarged mediastinal adenopathy is noted which most likely is reactive or inflammatory in etiology. Lungs/Pleura: No pneumothorax or pleural effusion is noted. Emphysematous disease is noted in both lungs. Bronchiectasis is noted in both lower lobes. There is interval development of diffuse reticular and interstitial densities throughout both lungs concerning for interstitial or atypical pneumonia or possibly edema. Upper Abdomen: No acute abnormality. Musculoskeletal: No chest wall mass or suspicious bone lesions identified. IMPRESSION: Interval development of diffuse and interstitial densities throughout both lungs concerning for interstitial or atypical pneumonia or possibly edema. Bronchiectasis is noted in both lower lobes. Mildly enlarged mediastinal adenopathy is noted which most likely is reactive or inflammatory in etiology. Coronary artery calcifications are noted suggesting coronary artery disease. Aortic Atherosclerosis (ICD10-I70.0) and Emphysema (ICD10-J43.9). Electronically Signed   By: Marijo Conception M.D.   On: 10/23/2018 10:15   Dg Chest Port 1 View  Result Date: 10/29/2018 CLINICAL DATA:  Shortness of breath. EXAM: PORTABLE CHEST 1 VIEW COMPARISON:  Radiograph of October 26, 2018. FINDINGS: Stable cardiomediastinal silhouette. Atherosclerosis of thoracic aorta is noted. No pneumothorax or pleural effusion is noted. Stable bilateral diffuse airspace and interstitial densities are noted throughout both lungs concerning for atypical infection or edema. Bony thorax unremarkable. IMPRESSION: Stable bilateral lung opacities as described above. Aortic Atherosclerosis (ICD10-I70.0). Electronically Signed   By: Marijo Conception M.D.   On: 10/29/2018 07:56   Dg Chest Port 1 View  Result Date: 10/26/2018 CLINICAL DATA:  Increased shortness of breath. EXAM: PORTABLE CHEST 1 VIEW COMPARISON:  10/22/2018. FINDINGS: Cardiomegaly. Diffuse BILATERAL interstitial and alveolar lung disease not significantly  changed from priors. No pneumothorax or effusion. Thoracic atherosclerosis. Bones unremarkable. IMPRESSION: Stable chest. Electronically Signed   By: Staci Righter M.D.   On: 10/26/2018 07:01   Dg Chest Port 1 View  Result Date: 10/22/2018 CLINICAL DATA:  Shortness of breath. EXAM: PORTABLE CHEST 1 VIEW COMPARISON:  Chest x-ray dated October 20, 2018. FINDINGS: Stable cardiomediastinal silhouette. Bilateral diffuse interstitial and alveolar airspace opacities with peripheral predominance again noted, slightly worsened in both upper lobes. No pleural effusion or pneumothorax. No acute osseous abnormality. IMPRESSION: 1. Peripheral  predominant bilateral interstitial and alveolar airspace disease, worsened in both upper lobes. Electronically Signed   By: Titus Dubin M.D.   On: 10/22/2018 19:41   Dg Chest Portable 1 View  Result Date: 10/20/2018 CLINICAL DATA:  Shortness of breath, hypoxia EXAM: PORTABLE CHEST 1 VIEW COMPARISON:  09/24/2018 FINDINGS: Bilateral diffuse interstitial and alveolar airspace opacities with a peripheral predominance. No pleural effusion or pneumothorax. Stable cardiomediastinal silhouette. No aggressive osseous lesion. IMPRESSION: 1. Chronic interstitial lung disease. 2. Increased bilateral interstitial and alveolar airspace opacities with a peripheral predominance. Differential considerations include multilobar pneumonia including atypical infection such as viral pneumonia. 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 daily plan for 2 months slow taper  - home with O2 at least for short term  - pulmonary clinic follow up will set up post d/c -Atelectasis - please provide patient with Acapella flutter valve and IS to take home with him. Chest PT today.  -please encourage patient to use every hour several times.  -please continue to work with physical therapy         -discussed case with cardiology team this am and respiratory therapist.      Thank you for allowing me to participate in the care of this patient.   This document was prepared using Dragon voice recognition software and may include unintentional dictation errors.     Ottie Glazier, M.D.  Division of [place]

## 2018-10-29 NOTE — Plan of Care (Signed)
  Problem: Education: Goal: Knowledge of General Education information will improve Description Including pain rating scale, medication(s)/side effects and non-pharmacologic comfort measures Outcome: Progressing   Problem: Nutrition: Goal: Adequate nutrition will be maintained Outcome: Progressing   Problem: Safety: Goal: Ability to remain free from injury will improve Outcome: Progressing   Problem: Clinical Measurements: Goal: Ability to maintain a body temperature in the normal range will improve Outcome: Progressing

## 2018-10-29 NOTE — Consult Note (Signed)
[facility] for Warfarin Indication: atrial fibrillation  Patient Measurements: Height: 6\' 2"  (188 cm) Weight: 170 lb 13.7 oz (77.5 kg) IBW/kg (Calculated) : 82.2  Vital Signs: Temp: 97.5 F (36.4 C) (04/28 0534) Temp Source: Oral (04/28 0534) BP: 116/67 (04/28 0534) Pulse Rate: 58 (04/28 0534)  Labs: Recent Labs    10/27/18 0352 10/28/18 0445 10/29/18 0512  HGB  --   --  13.6  HCT  --   --  41.3  PLT  --   --  161  LABPROT 28.8* 25.1* 24.2*  INR 2.8* 2.3* 2.2*  CREATININE 1.17  --  1.09    Estimated Creatinine Clearance: 49.4 mL/min (by C-G formula based on SCr of 1.09 mg/dL).   Assessment: Pharmacy was consulted to start dosing warfarin. INR increased from 2.6 to 3.3 on 4/23. Patient's home dose is 1.25 mg M-W-F and 2.5 mg T-Th-Sat-Sun. DDI: APAP- inc risk of bleeding, digoxin, amio increase INR, Bactrim started 4/24. Amiodarone was d/c'ed due to toxicity.   Date INR Warfarin Dose  4/22 2.6 1.25 mg  4/23 3.3 Hold  4/24 4.1 Hold  4/25 3.2 Hold  4/26 2.8 1 mg  4/27 2.3 1.5 mg  4/28 2.2     Goal of Therapy:  INR 2-3 Monitor platelets by anticoagulation protocol: Yes   Plan:  --INR has leveled out and is within goal range.  --repeat reduced dose warfarin 1.5 mg for tonight   --Daily INR ordered. CBC has been stable. Monitor for any sign or symptoms of bleeding.   Pharmacy will continue to follow.   Dallie Piles, PharmD Clinical Pharmacist  10/29/2018,7:11 AM

## 2018-10-29 NOTE — Progress Notes (Signed)
Physical Therapy Treatment Patient Details Name: Joseph Barton MRN: [id number] DOB: 1926/07/10 Today's Date: 10/29/2018    History of Present Illness  85 y.o. male with a known history of pneumonia in the past, chronic systolic heart failure with EF of 35%, coronary artery disease, abdominal aortic aneurysm, hyperlipidemia, hypertension, mitral regurgitation, atrial fibrillation on Coumadin presented to emergency room for shortness of breath and cough.   Has generalized weakness.  Recently treated for pneumonia (tested (-) for coronavirus)    PT Comments    Pt is able to do some ambulation this date but continues to have significant O2 requirements 3-4 liters at rest with O2 in the mid 80s, 6L during ambulation with drop into the low 70s and some shortness of breath that was subjectively less than the numbers indicated.  Pt's O2 slowly rises back to mid 80s at rest on 6 tapered to 4 liters.  Pt showed good effort and strength with seated exercises but did relatively poorly with ambulation.  He had 2 episodes of keeping the walker moving but not being able to advance feet up into walker and needed assist (considerable mod assist on second episode) to keep from falling forward into the outstretched walker.    Follow Up Recommendations  SNF     Equipment Recommendations  None recommended by PT    Recommendations for Other Services       Precautions / Restrictions Precautions Precautions: Fall Restrictions Weight Bearing Restrictions: No    Mobility  Bed Mobility Overal bed mobility: Modified Independent Bed Mobility: Supine to Sit     Supine to sit: Modified independent (Device/Increase time)     General bed mobility comments: Pt is able to get himself to EOB w/o assist  Transfers Overall transfer level: Needs assistance Equipment used: Rolling walker (2 wheeled) Transfers: Sit to/from Stand Sit to Stand: Supervision         General transfer comment: Pt is able to rise to  standing w/o assist, though is reliant on the walker and showed some unsteadiness  Ambulation/Gait Ambulation/Gait assistance: Min assist Gait Distance (Feet): 75 Feet Assistive device: Rolling walker (2 wheeled)       General Gait Details: Pt again on 6L during ambulation and still had sats drop to the low 70s with moderate distance/effort.  He showed good effort and subjective reports feeling good, however he clearly was quick to fatigue and had 2 episodes of losing balance with walker continuing to move forward but unable to get feet underneath him - both times required assist and the second one was very close to tipping fully forward needing considerable direct assist to keep him upright   Stairs             Wheelchair Mobility    Modified Rankin (Stroke Patients Only)       Balance Overall balance assessment: Needs assistance Sitting-balance support: No upper extremity supported Sitting balance-Leahy Scale: Good     Standing balance support: Bilateral upper extremity supported Standing balance-Leahy Scale: Poor Standing balance comment: Pt leaning on the walker, and X2 during ambulation too much so needing assist to keep from leaning too far forward and nearly falling face first                             Cognition Arousal/Alertness: Awake/alert Behavior During Therapy: WFL for tasks assessed/performed Overall Cognitive Status: Within Functional Limits for tasks assessed  Exercises General Exercises - Lower Extremity Ankle Circles/Pumps: AROM;10 reps Long Arc Quad: Strengthening;10 reps Heel Slides: Strengthening;10 reps Hip ABduction/ADduction: Strengthening;10 reps Hip Flexion/Marching: Strengthening;10 reps    General Comments        Pertinent Vitals/Pain Pain Assessment: No/denies pain    Home Living                      Prior Function            PT Goals (current  goals can now be found in the care plan section) Progress towards PT goals: Progressing toward goals    Frequency    Min 2X/week      PT Plan Current plan remains appropriate    Co-evaluation              AM-PAC PT "6 Clicks" Mobility   Outcome Measure  Help needed turning from your back to your side while in a flat bed without using bedrails?: None Help needed moving from lying on your back to sitting on the side of a flat bed without using bedrails?: A Little Help needed moving to and from a bed to a chair (including a wheelchair)?: None Help needed standing up from a chair using your arms (e.g., wheelchair or bedside chair)?: A Little Help needed to walk in hospital room?: A Lot Help needed climbing 3-5 steps with a railing? : Total 6 Click Score: 17    End of Session Equipment Utilized During Treatment: Gait belt;Oxygen Activity Tolerance: Patient limited by fatigue Patient left: with call bell/phone within reach;in chair;with chair alarm set Nurse Communication: Mobility status PT Visit Diagnosis: Muscle weakness (generalized) (M62.81);Difficulty in walking, not elsewhere classified (R26.2)     Time: 4818-5631 PT Time Calculation (min) (ACUTE ONLY): 25 min  Charges:  $Gait Training: 8-22 mins $Therapeutic Exercise: 8-22 mins                     Kreg Shropshire, DPT 10/29/2018, 2:13 PM

## 2018-10-30 LAB — PROTIME-INR
INR: 2.3 — ABNORMAL HIGH (ref 0.8–1.2)
Prothrombin Time: 24.8 seconds — ABNORMAL HIGH (ref 11.4–15.2)

## 2018-10-30 MED ORDER — BENZONATATE 100 MG PO CAPS
100.0000 mg | ORAL_CAPSULE | Freq: Two times a day (BID) | ORAL | Status: DC
  Administered 2018-10-30 – 2018-11-01 (×5): 100 mg via ORAL
  Filled 2018-10-30 (×4): qty 1

## 2018-10-30 MED ORDER — ALBUMIN HUMAN 25 % IV SOLN
12.5000 g | Freq: Once | INTRAVENOUS | Status: AC
  Administered 2018-10-30: 12:00:00 12.5 g via INTRAVENOUS
  Filled 2018-10-30: qty 50

## 2018-10-30 MED ORDER — WARFARIN SODIUM 1 MG PO TABS
1.5000 mg | ORAL_TABLET | Freq: Once | ORAL | Status: AC
  Administered 2018-10-30: 1.5 mg via ORAL
  Filled 2018-10-30: qty 1

## 2018-10-30 NOTE — Plan of Care (Signed)
  Problem: Nutrition: Goal: Adequate nutrition will be maintained Outcome: Progressing   Problem: Safety: Goal: Ability to remain free from injury will improve Outcome: Progressing   

## 2018-10-30 NOTE — Progress Notes (Signed)
Nutrition Follow Up Note   DOCUMENTATION CODES:   Not applicable  INTERVENTION:   Magic cup TID with meals, each supplement provides 290 kcal and 9 grams of protein  MVI daily   NUTRITION DIAGNOSIS:   Increased nutrient needs related to acute illness as evidenced by increased estimated needs.  GOAL:   Patient will meet greater than or equal to 90% of their needs  -progressing   MONITOR:   PO intake, Supplement acceptance, Labs, Weight trends, I & O's  ASSESSMENT:   85 y.o. male with a known history of pneumonia in the past, chronic systolic heart failure with EF of 35%, coronary artery disease, abdominal aortic aneurysm, hyperlipidemia, hypertension, mitral regurgitation, atrial fibrillation on Coumadin admitted with HCAP  RD working remotely.  Pt continues to do well; pt eating 100% of meals and eating Magic Cups. Pt complained of constipation today; pt to receive enema. Per chart, pt is weight stable since admit.   Medications reviewed and include: colace, lasix, MVI, protonix, miralax, prednisone, warfarin, bactrim  Labs reviewed: K 4.4 wnl, Mg 2.0 wnl Wbc- 14.7(H)  Diet Order:   Diet Order            Diet regular Room service appropriate? Yes; Fluid consistency: Thin; Fluid restriction: 1500 mL Fluid  Diet effective now             EDUCATION NEEDS:   Not appropriate for education at this time  Skin:  Skin Assessment: Reviewed RN Assessment(ecchymosis )  Last BM:  4/29- type 3  Height:   Ht Readings from Last 1 Encounters:  10/20/18 6\' 2"  (1.88 m)    Weight:   Wt Readings from Last 1 Encounters:  10/30/18 78.5 kg    Ideal Body Weight:  86.3 kg  BMI:  Body mass index is 22.21 kg/m.  Estimated Nutritional Needs:   Kcal:  2000-2300kcal/day   Protein:  100-115g/day  Fluid:  2L/day   Koleen Distance MS, RD, LDN Pager #- [phone] Office#- [phone] After Hours Pager: [phone]

## 2018-10-30 NOTE — Progress Notes (Signed)
[facility] NAME: Joseph Barton    MR#:  [id number]  DATE OF BIRTH:  1927/06/30  SUBJECTIVE:  CHIEF COMPLAINT:   Chief Complaint  Patient presents with  . Weakness   Patient had an episode of chest discomfort yesterday which is resolved.  Troponin was negative.  Twelve-lead EKG unremarkable.  Rested better last night.  No fevers overnight. Complaint of stuffy nose yesterday but after starting Afrin spray he is feeling much better and on 6 L oxygen via nasal cannula now.  REVIEW OF SYSTEMS:  Review of Systems  Constitutional: Negative for chills and fever.  HENT: Negative for hearing loss and tinnitus.   Eyes: Negative for blurred vision and double vision.  Respiratory: Positive for cough and shortness of breath.        Shortness of breath improving  Cardiovascular: Negative for chest pain and palpitations.  Gastrointestinal: Negative for abdominal pain, heartburn, nausea and vomiting.  Genitourinary: Negative for dysuria and urgency.  Musculoskeletal: Negative for myalgias and neck pain.  Skin: Negative for itching and rash.  Neurological: Negative for dizziness and headaches.  Psychiatric/Behavioral: Negative for depression and hallucinations.   DRUG ALLERGIES:   Allergies  Allergen Reactions  . Atorvastatin   . Simvastatin    VITALS:  Blood pressure 106/61, pulse 62, temperature 97.8 F (36.6 C), resp. rate 18, height 6\' 2"  (1.88 m), weight 78.5 kg, SpO2 91 %. PHYSICAL EXAMINATION:   Physical Exam  Constitutional: He is oriented to person, place, and time. He appears well-developed and well-nourished.  HENT:  Head: Normocephalic and atraumatic.  Right Ear: External ear normal.  Eyes: Pupils are equal, round, and reactive to light. Conjunctivae are normal. Right eye exhibits no discharge. No scleral icterus.  Neck: Normal range of motion. Neck supple. No tracheal deviation present.  Cardiovascular:  Irregularly  irregular  Respiratory: Effort normal. He has rales.  GI: Soft. Bowel sounds are normal. There is no abdominal tenderness.  Musculoskeletal: Normal range of motion.        General: No edema.  Neurological: He is alert and oriented to person, place, and time. No cranial nerve deficit.  Skin: Skin is warm. He is not diaphoretic. No erythema.  Psychiatric: He has a normal mood and affect. His behavior is normal.   LABORATORY PANEL:  Male CBC Recent Labs  Lab 10/29/18 0512  WBC 9.9  HGB 13.6  HCT 41.3  PLT 161   ------------------------------------------------------------------------------------------------------------------ Chemistries  Recent Labs  Lab 10/26/18 0603  10/29/18 0512  NA 135   < > 134*  K 4.3   < > 4.8  CL 100   < > 97*  CO2 27   < > 28  GLUCOSE 101*   < > 128*  BUN 35*   < > 32*  CREATININE 1.03   < > 1.09  CALCIUM 8.4*   < > 8.7*  MG 2.2  --   --    < > = values in this interval not displayed.   RADIOLOGY:  No results found. ASSESSMENT AND PLAN:  85 year old male patient with a known history of pneumonia in the past, chronic systolic heart failure with EF of 25-30%, coronary artery disease, abdominal aortic aneurysm, hyperlipidemia, hypertension, mitral regurgitation, atrial fibrillation on Coumadin presented to emergency room for shortness of breath and cough.   1.Acute respiratory failure with hypoxia Initially felt to be secondary to pneumonia.  Patient evaluated by infectious disease.  Patient was not  responding to antibiotics.  Antibiotics already discontinued since no clinical evidence to support pneumonia. Pneumonia ruled out. Recent CT chest without contrast done for further evaluation revealed interval development of diffuse and interstitial densities throughout both lungs concerning for interstitial or atypical pneumonia or possibly edema. Bronchiectasis is noted in both lower lobes. 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  Patient has been on amiodarone since January 2020.  Appreciate input from infectious disease specialist and pulmonologist. Amiodarone already discontinued.   Patient started on prednisone 40 mg p.o. daily with plans for 2 months slow taper.   Infectious disease specialist recommended Bactrim SS 1 tablet daily for PCP prophylaxis since patient is going to be on prednisone for more than 30 days which has been ordered Gradually weaning down oxygen requirement.  Patient was still requiring 5-6 L this morning.   Patient will most likely require home oxygen therapy on discharge.  Patient to follow-up with pulmonary clinic post discharge. Patient will also need Acapella flutter valve and incentive spirometer to take home with him on discharge. [id number] test was negative as such COVID infection ruled out. Pulmonary embolism very unlikely since patient already on anticoagulation with INR supratherapeutic at 4.1- > 2.8 > 2.2  2.Multilobar pneumonia ruled out Has been ruled out.  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  So far no growth on blood cultures. Coronavirus test negative.  COVID 19 ruled out  3.Chronic atrial fibrillation Patient on Coumadin with INR supratherapeutic at 4.1.  Pharmacy assisting with dosing as INR < 2.5 now. Amiodarone discontinued during this admission due to pulmonary toxicity.   cardiologist Dr.  Vilinda Blanks small dose of digoxin and metoprolol for rate control.   Electrophysiologist upon discharge   4. Hypertension Blood pressure controlled on current regimen  Lisinopril discontinued recently due to borderline low blood pressures  5.  Chronic systolic CHF Ejection fraction of 25 to 30%.  Lasix was initially placed on hold on admission due to concern for dehydration. Due to worsening respiratory status,  Lasix already resumed  Continue beta-blocker.  Lisinopril on hold due to blood pressure being borderline recently.  6.  Stuffy nose Patient was seen by ENT in the past. He was taking some decongestant pills over-the-counter at home. On his request I have spoken to ENT physician.  They suggested due to COVID-19 they are not doing any endoscopic procedures currently on elective basis but he suggested to give Afrin spray.  Advised to follow in the clinic if he symptomatically feels better. Patient is already feeling better just by 1 day of Afrin spray.  DVT prophylaxis; pharmacist dosing Coumadin.     Physical therapy has recommended placement to rehab.  Patient is hopeful to be Barton to go home after some more recovery in the hospital.  All the records are reviewed and case discussed with Care Management/Social Worker. Management plans discussed with the patient, and they are in agreement.  CODE STATUS: DNR  TOTAL TIME TAKING CARE OF THIS PATIENT: 33 minutes.   More than 50% of the time was spent in counseling/coordination of care: YES  POSSIBLE D/C IN 2 DAYS, DEPENDING ON CLINICAL CONDITION.   Vaughan Basta M.D on 10/30/2018 at 2:34 PM  Between 7am to 6pm - Pager - [phone]  After 6pm go to [email or web address] - [employer] [facility]  Office  [phone]  CC: Primary  care physician; Jerrol Banana., MD  Note: This dictation was prepared with Dragon dictation along with smaller phrase technology. Any transcriptional errors that result from this process are unintentional.

## 2018-10-30 NOTE — TOC Progression Note (Signed)
Transition of Care [facility]) - Progression Note    Patient Details  Name: Joseph Barton MRN: [id number] Date of Birth: 10-09-1926  Transition of Care [facility] CM/SW [place], [place] Phone Number: 10/30/2018, 2:37 PM  Clinical Narrative:  CSW continues to follow patient for discharge planning. CSW presented bed offers to patient but he is unwilling to make a decision at this time. He remains hopeful that he will be well enough to return home.  CSW will continue to follow for discharge planning.     Expected Discharge Plan: [place] Barriers to Discharge: Continued Medical Work up  Expected Discharge Plan and Services Expected Discharge Plan: [place]   Discharge Planning Services: CM Consult   Living arrangements for the past 2 months: Single Family Home                                       Social Determinants of Health (SDOH) Interventions    Readmission Risk Interventions Readmission Risk Prevention Plan 10/24/2018  Transportation Screening Complete  PCP or Specialist Appt within 3-5 Days Complete  HRI or Home Care Consult Complete  Social Work Consult for [place] Planning/Counseling Not Complete  SW consult not completed comments Patient feels that he does well with the support of his wife and daughter, he is not interested in a home health Education officer, museum.  Palliative Care Screening Not Applicable  Medication Review (RN Care Manager) Complete  Some recent data might be hidden

## 2018-10-30 NOTE — Progress Notes (Signed)
Patient on nasal cannula at 5 45% liters. Patient c/o difficulty breathing and a stuffy nose. Saturation on cannula noted at 91%. Patient insist he cannot breath this way.  Placed patient on venturi mask at 35%.  Patient o2 saturation noted at 95%  States he feels better. Will continue to monitor.

## 2018-10-30 NOTE — Progress Notes (Signed)
Pulmonary Medicine          Date: 10/30/2018,   MRN# [id number] DELQUAN POUCHER 1926/10/12     AdmissionWeight: 83 kg                 CurrentWeight: 78.5 kg        SUBJECTIVE    Overnight events- increased O2 requirement due to dyspnea currently at 6 L/min via mask Patient complains of abdominal pain from constipation-ordering soapsuds enema Total approximately 8 L net negative during this admission without improvement on O2 req.   PAST MEDICAL HISTORY   Past Medical History:  Diagnosis Date  . Abdominal aortic aneurysm ([place]) 01-2004   4.7 x 4.7 cm.  Marland Kitchen CAD (coronary artery disease) 01/25/2004   a. 01/2004 Ant MI/DES to LAD;  b. 10/2011 Neg MV.  . Chronic systolic heart failure ([place])    a. 08/2016 Echo: EF 35%, nl RV fxn, mod to sev MR, mild to mod TR, mod biatrial enlargement.  . Hyperlipidemia   . Hypertension   . Ischemic cardiomyopathy    a. 08/2016 Echo: EF 35%.  . Moderate to Severe Mitral regurgitation    a. 08/2016 Echo: Mod-sev MR.  Marland Kitchen Permanent atrial fibrillation    a. Chronic coumadin (CHA2DS2VASc = 5).  . Pulmonary nodules    Noted on abdominal CT  . Statin intolerance      SURGICAL HISTORY   Past Surgical History:  Procedure Laterality Date  . ABDOMINAL AORTIC ANEURYSM REPAIR     12/02/2007 [facility]  . ABDOMINAL AORTIC ANEURYSM REPAIR  2006   [facility]   . CARDIAC CATHETERIZATION  2009   [facility]  . CORONARY ANGIOPLASTY WITH STENT PLACEMENT  2005   Cypher stent LAD   . Cypher stents to LAD     01/25/2004  . SKIN SURGERY  2016   [facility] skin cancer     FAMILY HISTORY   Family History  Problem Relation Age of Onset  . Heart attack Father 24       Died w/ Heart attack  . Diabetes Father   . Heart attack Brother   . Heart disease Brother   . Diabetes Sister   . Heart disease Sister      SOCIAL HISTORY   Social History   Tobacco Use  . Smoking status: Former Smoker    Last attempt to quit: 12/01/1987    Years since quitting:  30.9  . Smokeless tobacco: Never Used  Substance Use Topics  . Alcohol use: No  . Drug use: No     MEDICATIONS    Home Medication:    Current Medication:  Current Facility-Administered Medications:  .  0.9 %  sodium chloride infusion, 250 mL, Intravenous, PRN, Saundra Shelling, MD, Stopped at 10/22/18 1845 .  0.9 %  sodium chloride infusion, , Intravenous, Continuous, Ojie, Jude, MD .  acetaminophen (TYLENOL) tablet 650 mg, 650 mg, Oral, Q6H PRN, Ojie, Jude, MD, 650 mg at 10/26/18 1114 .  albuterol (PROVENTIL) (2.5 MG/3ML) 0.083% nebulizer solution 2.5 mg, 2.5 mg, Inhalation, Q2H PRN, Vaughan Basta, MD .  benzonatate (TESSALON) capsule 100 mg, 100 mg, Oral, TID PRN, Stark Jock, Jude, MD, 100 mg at 10/28/18 0908 .  bisacodyl (DULCOLAX) EC tablet 5 mg, 5 mg, Oral, Daily PRN, Seals, Angela H, NP, 5 mg at 10/30/18 0920 .  digoxin (LANOXIN) tablet 0.0625 mg, 0.0625 mg, Oral, Daily, Pyreddy, Pavan, MD, 0.0625 mg at 10/30/18 0920 .  diphenhydrAMINE (BENADRYL) capsule 25  mg, 25 mg, Oral, Q8H PRN, Vaughan Basta, MD, 25 mg at 10/30/18 0551 .  docusate sodium (COLACE) capsule 200 mg, 200 mg, Oral, BID, Harrie Foreman, MD, 200 mg at 10/30/18 0920 .  fluticasone (FLONASE) 50 MCG/ACT nasal spray 1 spray, 1 spray, Each Nare, Daily, Seals, Angela H, NP, 1 spray at 10/30/18 0921 .  furosemide (LASIX) tablet 40 mg, 40 mg, Oral, Daily, Vaughan Basta, MD, 40 mg at 10/30/18 0920 .  guaiFENesin-dextromethorphan (ROBITUSSIN DM) 100-10 MG/5ML syrup 5 mL, 5 mL, Oral, Q4H PRN, Ojie, Jude, MD, 5 mL at 10/30/18 0931 .  ipratropium-albuterol (DUONEB) 0.5-2.5 (3) MG/3ML nebulizer solution 3 mL, 3 mL, Nebulization, TID, Ojie, Jude, MD, 3 mL at 10/30/18 0813 .  metoprolol succinate (TOPROL-XL) 24 hr tablet 25 mg, 25 mg, Oral, Daily, Fay Records, MD, 25 mg at 10/30/18 0920 .  multivitamin with minerals tablet 1 tablet, 1 tablet, Oral, Daily, Ojie, Jude, MD, 1 tablet at 10/30/18 0920 .   nitroGLYCERIN (NITROSTAT) SL tablet 0.4 mg, 0.4 mg, Sublingual, Q5 min PRN, Ojie, Jude, MD .  oxymetazoline (AFRIN) 0.05 % nasal spray 3 spray, 3 spray, Each Nare, BID, Vaughan Basta, MD, 3 spray at 10/30/18 0921 .  pantoprazole (PROTONIX) EC tablet 40 mg, 40 mg, Oral, Daily, Pyreddy, Pavan, MD, 40 mg at 10/30/18 0920 .  polyethylene glycol (MIRALAX / GLYCOLAX) packet 17 g, 17 g, Oral, Daily, Harrie Foreman, MD, 17 g at 10/30/18 8937 .  predniSONE (DELTASONE) tablet 40 mg, 40 mg, Oral, Q breakfast, Milas Schappell, MD, 40 mg at 10/30/18 0920 .  sodium chloride flush (NS) 0.9 % injection 3 mL, 3 mL, Intravenous, Q12H, Pyreddy, Pavan, MD, 3 mL at 10/30/18 0921 .  sodium chloride flush (NS) 0.9 % injection 3 mL, 3 mL, Intravenous, PRN, Pyreddy, Pavan, MD, 3 mL at 10/24/18 0641 .  sulfamethoxazole-trimethoprim (BACTRIM) 400-80 MG per tablet 1 tablet, 1 tablet, Oral, Daily, Ojie, Jude, MD, 1 tablet at 10/30/18 0920 .  tamsulosin (FLOMAX) capsule 0.4 mg, 0.4 mg, Oral, Daily, Pyreddy, Pavan, MD, 0.4 mg at 10/30/18 0920 .  traZODone (DESYREL) tablet 50 mg, 50 mg, Oral, QHS PRN, Mayo, Pete Pelt, MD, 50 mg at 10/26/18 2219 .  warfarin (COUMADIN) tablet 1.5 mg, 1.5 mg, Oral, ONCE-1800, Dallie Piles, RPH .  Warfarin - Pharmacist Dosing Inpatient, , Does not apply, q1800, Ojie, Jude, MD    ALLERGIES   Atorvastatin and Simvastatin     REVIEW OF SYSTEMS    Review of Systems:  Gen:  Denies  fever, sweats, chills weigh loss  HEENT: Denies blurred vision, double vision, ear pain, eye pain, hearing loss, nose bleeds, sore throat Cardiac:  No dizziness, chest pain or heaviness, chest tightness,edema Resp:   Denies cough or sputum porduction, shortness of breath,wheezing, hemoptysis,  Gi: Denies swallowing difficulty, stomach pain, nausea or vomiting, diarrhea, constipation, bowel incontinence Gu:  Denies bladder incontinence, burning urine Ext:   Denies Joint pain, stiffness or swelling  Skin: Denies  skin rash, easy bruising or bleeding or hives Endoc:  Denies polyuria, polydipsia , polyphagia or weight change Psych:   Denies depression, insomnia or hallucinations   Other:  All other systems negative   VS: BP (!) 101/58 (BP Location: Left Arm)   Pulse 68   Temp (!) 97.5 F (36.4 C) (Oral)   Resp 19   Ht 6\' 2"  (1.88 m)   Wt 78.5 kg   SpO2 93%   BMI 22.21 kg/m  PHYSICAL EXAM    GENERAL:NAD, no fevers, chills, no weakness no fatigue HEAD: Normocephalic, atraumatic.  EYES: Pupils equal, round, reactive to light. Extraocular muscles intact. No scleral icterus.  MOUTH: Moist mucosal membrane. Dentition intact. No abscess noted.  EAR, NOSE, THROAT: Clear without exudates. No external lesions.  NECK: Supple. No thyromegaly. No nodules. No JVD.  PULMONARY: Bilateral Velcro crepitations at the bases CARDIOVASCULAR: S1 and S2. Regular rate and rhythm. No murmurs, rubs, or gallops. No edema. Pedal pulses 2+ bilaterally.  GASTROINTESTINAL: Soft, nontender, nondistended. No masses. Positive bowel sounds. No hepatosplenomegaly.  MUSCULOSKELETAL: No swelling, clubbing, or edema. Range of motion full in all extremities.  NEUROLOGIC: Cranial nerves II through XII are intact. No gross focal neurological deficits. Sensation intact. Reflexes intact.  SKIN: No ulceration, lesions, rashes, or cyanosis. Skin warm and dry. Turgor intact.  PSYCHIATRIC: Mood, affect within normal limits. The patient is awake, alert and oriented x 3. Insight, judgment intact.       IMAGING    Ct Chest Wo Contrast  Result Date: 10/23/2018 CLINICAL DATA:  Cough, shortness of breath. EXAM: CT CHEST WITHOUT CONTRAST TECHNIQUE: Multidetector CT imaging of the chest was performed following the standard protocol without IV contrast. COMPARISON:  Radiograph of October 22, 2018. CT scan of July 15, 2018. FINDINGS: Cardiovascular: Atherosclerosis of thoracic aorta is noted without aneurysm formation.  Normal cardiac size. Coronary artery calcifications are noted. No pericardial effusion. Mediastinum/Nodes: Thyroid gland and esophagus are unremarkable. Mildly enlarged mediastinal adenopathy is noted which most likely is reactive or inflammatory in etiology. Lungs/Pleura: No pneumothorax or pleural effusion is noted. Emphysematous disease is noted in both lungs. Bronchiectasis is noted in both lower lobes. There is interval development of diffuse reticular and interstitial densities throughout both lungs concerning for interstitial or atypical pneumonia or possibly edema. Upper Abdomen: No acute abnormality. Musculoskeletal: No chest wall mass or suspicious bone lesions identified. IMPRESSION: Interval development of diffuse and interstitial densities throughout both lungs concerning for interstitial or atypical pneumonia or possibly edema. Bronchiectasis is noted in both lower lobes. Mildly enlarged mediastinal adenopathy is noted which most likely is reactive or inflammatory in etiology. Coronary artery calcifications are noted suggesting coronary artery disease. Aortic Atherosclerosis (ICD10-I70.0) and Emphysema (ICD10-J43.9). Electronically Signed   By: Marijo Conception M.D.   On: 10/23/2018 10:15   Dg Chest Port 1 View  Result Date: 10/29/2018 CLINICAL DATA:  Shortness of breath. EXAM: PORTABLE CHEST 1 VIEW COMPARISON:  Radiograph of October 26, 2018. FINDINGS: Stable cardiomediastinal silhouette. Atherosclerosis of thoracic aorta is noted. No pneumothorax or pleural effusion is noted. Stable bilateral diffuse airspace and interstitial densities are noted throughout both lungs concerning for atypical infection or edema. Bony thorax unremarkable. IMPRESSION: Stable bilateral lung opacities as described above. Aortic Atherosclerosis (ICD10-I70.0). Electronically Signed   By: Marijo Conception M.D.   On: 10/29/2018 07:56   Dg Chest Port 1 View  Result Date: 10/26/2018 CLINICAL DATA:  Increased shortness of  breath. EXAM: PORTABLE CHEST 1 VIEW COMPARISON:  10/22/2018. FINDINGS: Cardiomegaly. Diffuse BILATERAL interstitial and alveolar lung disease not significantly changed from priors. No pneumothorax or effusion. Thoracic atherosclerosis. Bones unremarkable. IMPRESSION: Stable chest. Electronically Signed   By: Staci Righter M.D.   On: 10/26/2018 07:01   Dg Chest Port 1 View  Result Date: 10/22/2018 CLINICAL DATA:  Shortness of breath. EXAM: PORTABLE CHEST 1 VIEW COMPARISON:  Chest x-ray dated October 20, 2018. FINDINGS: Stable cardiomediastinal silhouette. Bilateral diffuse interstitial and alveolar airspace opacities with peripheral  predominance again noted, slightly worsened in both upper lobes. No pleural effusion or pneumothorax. No acute osseous abnormality. IMPRESSION: 1. Peripheral predominant bilateral interstitial and alveolar airspace disease, worsened in both upper lobes. Electronically Signed   By: Titus Dubin M.D.   On: 10/22/2018 19:41   Dg Chest Portable 1 View  Result Date: 10/20/2018 CLINICAL DATA:  Shortness of breath, hypoxia EXAM: PORTABLE CHEST 1 VIEW COMPARISON:  09/24/2018 FINDINGS: Bilateral diffuse interstitial and alveolar airspace opacities with a peripheral predominance. No pleural effusion or pneumothorax. Stable cardiomediastinal silhouette. No aggressive osseous lesion. IMPRESSION: 1. Chronic interstitial lung disease. 2. Increased bilateral interstitial and alveolar airspace opacities with a peripheral predominance. Differential considerations include multilobar pneumonia including atypical infection such as viral pneumonia. Electronically Signed   By: Kathreen Devoid   On: 10/20/2018 18:55      ASSESSMENT/PLAN   Acute hypoxemic respiratory failure -Likely due to amiodarone induced pulmonary toxicity - s/p [id number] -ID and Cardiology on case - appreciate input -will start prednisone 40 mg p.o. daily plan for 2 months  slow taper- on bactrim for PCP ppx - home with O2 at least for short term  - pulmonary clinic follow up will set up post d/c -Atelectasis - please provide patient with Acapella flutter valve and IS to take home with him. Chest PT today.  -please encourage patient to use every hour several times.  -please continue to work with physical therapy -discussed case with cardiology team this am and respiratory therapist.     Thank you for allowing me to participate in the care of this patient.   This document was prepared using Dragon voice recognition software and may include unintentional dictation errors.     Ottie Glazier, M.D.  Division of [place]

## 2018-10-30 NOTE — Consult Note (Signed)
ANTICOAGULATION CONSULT NOTE   Pharmacy Consult for Warfarin Indication: atrial fibrillation  Patient Measurements: Height: 6\' 2"  (188 cm) Weight: 173 lb (78.5 kg) IBW/kg (Calculated) : 82.2  Vital Signs: Temp: 97.5 F (36.4 C) (04/29 0203) Temp Source: Oral (04/29 0203) BP: 104/63 (04/29 0203) Pulse Rate: 53 (04/29 0203)  Labs: Recent Labs    10/28/18 0445 10/29/18 0512 10/30/18 0534  HGB  --  13.6  --   HCT  --  41.3  --   PLT  --  161  --   LABPROT 25.1* 24.2* 24.8*  INR 2.3* 2.2* 2.3*  CREATININE  --  1.09  --     Estimated Creatinine Clearance: 50 mL/min (by C-G formula based on SCr of 1.09 mg/dL).   Assessment: Pharmacy was consulted to start dosing warfarin. INR increased from 2.6 to 3.3 on 4/23. Patient's home dose is 1.25 mg M-W-F and 2.5 mg T-Th-Sat-Sun. DDI: APAP- inc risk of bleeding, digoxin, amio increase INR, Bactrim started 4/24. Amiodarone was d/c'ed due to toxicity.   Date INR Warfarin Dose  4/22 2.6 1.25 mg  4/23 3.3 Hold  4/24 4.1 Hold  4/25 3.2 Hold  4/26 2.8 1 mg  4/27 2.3 1.5 mg  4/28 2.2 1.5 mg    Goal of Therapy:  INR 2-3 Monitor platelets by anticoagulation protocol: Yes   Plan:  --INR is stable and within goal range.  --repeat reduced dose warfarin 1.5 mg for tonight   --Daily INR ordered. CBC has been stable. Monitor for any sign or symptoms of bleeding.   Pharmacy will continue to follow.   Dallie Piles, PharmD Clinical Pharmacist  10/30/2018,7:29 AM

## 2018-10-31 ENCOUNTER — Inpatient Hospital Stay: Payer: Medicare Other

## 2018-10-31 LAB — PROTIME-INR
INR: 2.6 — ABNORMAL HIGH (ref 0.8–1.2)
Prothrombin Time: 27.7 seconds — ABNORMAL HIGH (ref 11.4–15.2)

## 2018-10-31 MED ORDER — DIPHENHYDRAMINE HCL 25 MG PO CAPS
25.0000 mg | ORAL_CAPSULE | Freq: Once | ORAL | Status: AC
  Administered 2018-10-31: 25 mg via ORAL
  Filled 2018-10-31: qty 1

## 2018-10-31 MED ORDER — WARFARIN SODIUM 1 MG PO TABS
1.0000 mg | ORAL_TABLET | Freq: Once | ORAL | Status: AC
  Administered 2018-11-01: 1 mg via ORAL
  Filled 2018-10-31: qty 1

## 2018-10-31 NOTE — TOC Progression Note (Signed)
Transition of Care [facility]) - Progression Note    Patient Details  Name: BRITTEN SEYFRIED MRN: [id number] Date of Birth: Nov 22, 1926  Transition of Care [facility]) CM/SW [place], [place] Phone Number: 10/31/2018, 3:15 PM  Clinical Narrative:  CSW spoke with patient and wife. Patient and wife are now refusing SNF and would like to return home with home health. Wife states that patient has used [facility] in the past and they would like to use their services again. CSW notified Corene Cornea with [facility] of referral. CSW will continue to follow for discharge planning.      Expected Discharge Plan: [place] Barriers to Discharge: Continued Medical Work up  Expected Discharge Plan and Services Expected Discharge Plan: [place]   Discharge Planning Services: CM Consult   Living arrangements for the past 2 months: Single Family Home                                       Social Determinants of Health (SDOH) Interventions    Readmission Risk Interventions Readmission Risk Prevention Plan 10/24/2018  Transportation Screening Complete  PCP or Specialist Appt within 3-5 Days Complete  HRI or Home Care Consult Complete  Social Work Consult for [place] Planning/Counseling Not Complete  SW consult not completed comments Patient feels that he does well with the support of his wife and daughter, he is not interested in a home health Education officer, museum.  Palliative Care Screening Not Applicable  Medication Review (RN Care Manager) Complete  Some recent data might be hidden

## 2018-10-31 NOTE — Progress Notes (Signed)
Pulse oximetry on room air is 80% while at rest. Pulse oximetry on 2L/min is 90% while at rest.

## 2018-10-31 NOTE — Consult Note (Signed)
[place] for Warfarin Indication: atrial fibrillation  Patient Measurements: Height: 6\' 2"  (188 cm) Weight: 173 lb 4.8 oz (78.6 kg) IBW/kg (Calculated) : 82.2  Vital Signs: Temp: 97.6 F (36.4 C) (04/30 0713) Temp Source: Oral (04/30 0713) BP: 94/61 (04/30 0713) Pulse Rate: 53 (04/30 0735)  Labs: Recent Labs    10/29/18 0512 10/30/18 0534 10/31/18 0337  HGB 13.6  --   --   HCT 41.3  --   --   PLT 161  --   --   LABPROT 24.2* 24.8* 27.7*  INR 2.2* 2.3* 2.6*  CREATININE 1.09  --   --     Estimated Creatinine Clearance: 50.1 mL/min (by C-G formula based on SCr of 1.09 mg/dL).   Assessment: Pharmacy was consulted to start dosing warfarin. INR increased from 2.6 to 3.3 on 4/23. Patient's home dose is 1.25 mg M-W-F and 2.5 mg T-Th-Sat-Sun. DDI: APAP- inc risk of bleeding, digoxin, amio increase INR, Bactrim started 4/24. Amiodarone was d/c'ed due to toxicity.   Date INR Warfarin Dose  4/22 2.6 1.25 mg  4/23 3.3 Hold  4/24 4.1 Hold  4/25 3.2 Hold  4/26 2.8 1 mg  4/27 2.3 1.5 mg  4/28 2.2 1.5 mg  4/29 2.3 1.5 mg    Goal of Therapy:  INR 2-3 Monitor platelets by anticoagulation protocol: Yes   Plan:  --INR today has risen slightly but still within goal range.  --further reduce dose of warfarin to 1 mg for tonight   --Daily INR ordered. CBC has been stable. Monitor for any sign or symptoms of bleeding.  --CBC in am  Pharmacy will continue to follow.   Dallie Piles, PharmD Clinical Pharmacist  10/31/2018,10:15 AM

## 2018-10-31 NOTE — Plan of Care (Signed)
  Problem: Clinical Measurements: Goal: Respiratory complications will improve Outcome: Progressing   Problem: Nutrition: Goal: Adequate nutrition will be maintained Outcome: Progressing   Problem: Safety: Goal: Ability to remain free from injury will improve Outcome: Progressing   Problem: Education: Goal: Knowledge of General Education information will improve Description Including pain rating scale, medication(s)/side effects and non-pharmacologic comfort measures Outcome: Completed/Met

## 2018-10-31 NOTE — Progress Notes (Signed)
Pulmonary Medicine          Date: 10/31/2018,   MRN# [id number] Joseph Barton [birth year]     AdmissionWeight: 83 kg                 CurrentWeight: 78.6 kg         SUBJECTIVE    Patient sitting up in no distress, O2 requirement significantly improved.  Will attempt to optimize for discharge from pulmonary perspective today  PAST MEDICAL HISTORY   Past Medical History:  Diagnosis Date   Abdominal aortic aneurysm ([place]) 01-2004   4.7 x 4.7 cm.   CAD (coronary artery disease) 01/25/2004   a. 01/2004 Ant MI/DES to LAD;  b. 10/2011 Neg MV.   Chronic systolic heart failure ([place])    a. 08/2016 Echo: EF 35%, nl RV fxn, mod to sev MR, mild to mod TR, mod biatrial enlargement.   Hyperlipidemia    Hypertension    Ischemic cardiomyopathy    a. 08/2016 Echo: EF 35%.   Moderate to Severe Mitral regurgitation    a. 08/2016 Echo: Mod-sev MR.   Permanent atrial fibrillation    a. Chronic coumadin (CHA2DS2VASc = 5).   Pulmonary nodules    Noted on abdominal CT   Statin intolerance      SURGICAL HISTORY   Past Surgical History:  Procedure Laterality Date   ABDOMINAL AORTIC ANEURYSM REPAIR     12/02/2007 [facility]   ABDOMINAL AORTIC ANEURYSM REPAIR  2006   [facility]    CARDIAC CATHETERIZATION  2009   [facility]   CORONARY ANGIOPLASTY WITH STENT PLACEMENT  2005   Cypher stent LAD    Cypher stents to LAD     01/25/2004   SKIN SURGERY  2016   [facility] skin cancer     FAMILY HISTORY   Family History  Problem Relation Age of Onset   Heart attack Father 42       Died w/ Heart attack   Diabetes Father    Heart attack Brother    Heart disease Brother    Diabetes Sister    Heart disease Sister      SOCIAL HISTORY   Social History   Tobacco Use   Smoking status: Former Smoker    Last attempt to quit: 12/01/1987    Years since quitting: 30.9   Smokeless tobacco: Never Used  Substance Use Topics   Alcohol use: No   Drug use: No      MEDICATIONS    Home Medication:    Current Medication:  Current Facility-Administered Medications:    0.9 %  sodium chloride infusion, 250 mL, Intravenous, PRN, Pyreddy, Reatha Harps, MD, Stopped at 10/22/18 1845   0.9 %  sodium chloride infusion, , Intravenous, Continuous, Ojie, Jude, MD   acetaminophen (TYLENOL) tablet 650 mg, 650 mg, Oral, Q6H PRN, Ojie, Jude, MD, 650 mg at 10/26/18 1114   albuterol (PROVENTIL) (2.5 MG/3ML) 0.083% nebulizer solution 2.5 mg, 2.5 mg, Inhalation, Q2H PRN, Vaughan Basta, MD   benzonatate (TESSALON) capsule 100 mg, 100 mg, Oral, TID PRN, Stark Jock, Jude, MD, 100 mg at 10/30/18 1138   benzonatate (TESSALON) capsule 100 mg, 100 mg, Oral, BID, Vaughan Basta, MD, 100 mg at 10/30/18 2104   bisacodyl (DULCOLAX) EC tablet 5 mg, 5 mg, Oral, Daily PRN, Seals, Angela H, NP, 5 mg at 10/30/18 0920   digoxin (LANOXIN) tablet 0.0625 mg, 0.0625 mg, Oral, Daily, Pyreddy, Pavan, MD, 0.0625 mg at 10/30/18 0920   docusate  sodium (COLACE) capsule 200 mg, 200 mg, Oral, BID, Harrie Foreman, MD, 200 mg at 10/30/18 2104   fluticasone (FLONASE) 50 MCG/ACT nasal spray 1 spray, 1 spray, Each Nare, Daily, Seals, Theo Dills, NP, 1 spray at 10/30/18 5284   furosemide (LASIX) tablet 40 mg, 40 mg, Oral, Daily, Vaughan Basta, MD, 40 mg at 10/30/18 0920   guaiFENesin-dextromethorphan (ROBITUSSIN DM) 100-10 MG/5ML syrup 5 mL, 5 mL, Oral, Q4H PRN, Ojie, Jude, MD, 5 mL at 10/30/18 0931   ipratropium-albuterol (DUONEB) 0.5-2.5 (3) MG/3ML nebulizer solution 3 mL, 3 mL, Nebulization, TID, Ojie, Jude, MD, 3 mL at 10/31/18 0735   metoprolol succinate (TOPROL-XL) 24 hr tablet 25 mg, 25 mg, Oral, Daily, Fay Records, MD, 25 mg at 10/30/18 0920   multivitamin with minerals tablet 1 tablet, 1 tablet, Oral, Daily, Ojie, Jude, MD, 1 tablet at 10/30/18 0920   nitroGLYCERIN (NITROSTAT) SL tablet 0.4 mg, 0.4 mg, Sublingual, Q5 min PRN, Ojie, Jude, MD   oxymetazoline  (AFRIN) 0.05 % nasal spray 3 spray, 3 spray, Each Nare, BID, Vaughan Basta, MD, 3 spray at 10/30/18 2104   pantoprazole (PROTONIX) EC tablet 40 mg, 40 mg, Oral, Daily, Pyreddy, Pavan, MD, 40 mg at 10/30/18 0920   polyethylene glycol (MIRALAX / GLYCOLAX) packet 17 g, 17 g, Oral, Daily, Harrie Foreman, MD, 17 g at 10/30/18 1324   predniSONE (DELTASONE) tablet 40 mg, 40 mg, Oral, Q breakfast, Ottie Glazier, MD, 40 mg at 10/30/18 0920   sodium chloride flush (NS) 0.9 % injection 3 mL, 3 mL, Intravenous, Q12H, Pyreddy, Pavan, MD, 3 mL at 10/30/18 2105   sodium chloride flush (NS) 0.9 % injection 3 mL, 3 mL, Intravenous, PRN, Pyreddy, Reatha Harps, MD, 3 mL at 10/24/18 0641   sulfamethoxazole-trimethoprim (BACTRIM) 400-80 MG per tablet 1 tablet, 1 tablet, Oral, Daily, Ojie, Jude, MD, 1 tablet at 10/30/18 0920   tamsulosin (FLOMAX) capsule 0.4 mg, 0.4 mg, Oral, Daily, Pyreddy, Pavan, MD, 0.4 mg at 10/30/18 0920   traZODone (DESYREL) tablet 50 mg, 50 mg, Oral, QHS PRN, Mayo, Pete Pelt, MD, 50 mg at 10/30/18 2104   Warfarin - Pharmacist Dosing Inpatient, , Does not apply, q1800, Ojie, Jude, MD    ALLERGIES   Atorvastatin and Simvastatin     REVIEW OF SYSTEMS    Review of Systems:  Gen:  Denies  fever, sweats, chills weigh loss  HEENT: Denies blurred vision, double vision, ear pain, eye pain, hearing loss, nose bleeds, sore throat Cardiac:  No dizziness, chest pain or heaviness, chest tightness,edema Resp:   Denies cough or sputum porduction, shortness of breath,wheezing, hemoptysis,  Gi: Denies swallowing difficulty, stomach pain, nausea or vomiting, diarrhea, constipation, bowel incontinence Gu:  Denies bladder incontinence, burning urine Ext:   Denies Joint pain, stiffness or swelling Skin: Denies  skin rash, easy bruising or bleeding or hives Endoc:  Denies polyuria, polydipsia , polyphagia or weight change Psych:   Denies depression, insomnia or hallucinations   Other:   All other systems negative   VS: BP 94/61 (BP Location: Left Arm)    Pulse (!) 53    Temp 97.6 F (36.4 C) (Oral)    Resp 16    Ht 6\' 2"  (1.88 m)    Wt 78.6 kg    SpO2 93%    BMI 22.25 kg/m      PHYSICAL EXAM    GENERAL:NAD, no fevers, chills, no weakness no fatigue HEAD: Normocephalic, atraumatic.  EYES: Pupils equal, round, reactive to light. Extraocular  muscles intact. No scleral icterus.  MOUTH: Moist mucosal membrane. Dentition intact. No abscess noted.  EAR, NOSE, THROAT: Clear without exudates. No external lesions.  NECK: Supple. No thyromegaly. No nodules. No JVD.  PULMONARY: mild velcro crepitations at bases bilaterally  CARDIOVASCULAR: S1 and S2. Regular rate and rhythm. No murmurs, rubs, or gallops. No edema. Pedal pulses 2+ bilaterally.  GASTROINTESTINAL: Soft, nontender, nondistended. No masses. Positive bowel sounds. No hepatosplenomegaly.  MUSCULOSKELETAL: No swelling, clubbing, or edema. Range of motion full in all extremities.  NEUROLOGIC: Cranial nerves II through XII are intact. No gross focal neurological deficits. Sensation intact. Reflexes intact.  SKIN: No ulceration, lesions, rashes, or cyanosis. Skin warm and dry. Turgor intact.  PSYCHIATRIC: Mood, affect within normal limits. The patient is awake, alert and oriented x 3. Insight, judgment intact.       IMAGING    Ct Chest Wo Contrast  Result Date: 10/23/2018 CLINICAL DATA:  Cough, shortness of breath. EXAM: CT CHEST WITHOUT CONTRAST TECHNIQUE: Multidetector CT imaging of the chest was performed following the standard protocol without IV contrast. COMPARISON:  Radiograph of October 22, 2018. CT scan of July 15, 2018. FINDINGS: Cardiovascular: Atherosclerosis of thoracic aorta is noted without aneurysm formation. Normal cardiac size. Coronary artery calcifications are noted. No pericardial effusion. Mediastinum/Nodes: Thyroid gland and esophagus are unremarkable. Mildly enlarged mediastinal adenopathy is  noted which most likely is reactive or inflammatory in etiology. Lungs/Pleura: No pneumothorax or pleural effusion is noted. Emphysematous disease is noted in both lungs. Bronchiectasis is noted in both lower lobes. There is interval development of diffuse reticular and interstitial densities throughout both lungs concerning for interstitial or atypical pneumonia or possibly edema. Upper Abdomen: No acute abnormality. Musculoskeletal: No chest wall mass or suspicious bone lesions identified. IMPRESSION: Interval development of diffuse and interstitial densities throughout both lungs concerning for interstitial or atypical pneumonia or possibly edema. Bronchiectasis is noted in both lower lobes. Mildly enlarged mediastinal adenopathy is noted which most likely is reactive or inflammatory in etiology. Coronary artery calcifications are noted suggesting coronary artery disease. Aortic Atherosclerosis (ICD10-I70.0) and Emphysema (ICD10-J43.9). Electronically Signed   By: Marijo Conception M.D.   On: 10/23/2018 10:15   Dg Chest Port 1 View  Result Date: 10/31/2018 CLINICAL DATA:  Pneumonia. EXAM: PORTABLE CHEST 1 VIEW COMPARISON:  10/29/2018 FINDINGS: Stable mild cardiac enlargement. Severe bilateral pneumonia again noted superimposed on chronic lung disease. Density of peripheral consolidation in the right upper lobe appears slightly improved. Other areas of airspace disease bilaterally appear stable. Lung volumes are stable. No evidence of pleural fluid or pneumothorax. IMPRESSION: Relatively stable severe bilateral pneumonia with slightly improved density of consolidation in the peripheral right upper lobe. Electronically Signed   By: Aletta Edouard M.D.   On: 10/31/2018 08:21   Dg Chest Port 1 View  Result Date: 10/29/2018 CLINICAL DATA:  Shortness of breath. EXAM: PORTABLE CHEST 1 VIEW COMPARISON:  Radiograph of October 26, 2018. FINDINGS: Stable cardiomediastinal silhouette. Atherosclerosis of thoracic aorta  is noted. No pneumothorax or pleural effusion is noted. Stable bilateral diffuse airspace and interstitial densities are noted throughout both lungs concerning for atypical infection or edema. Bony thorax unremarkable. IMPRESSION: Stable bilateral lung opacities as described above. Aortic Atherosclerosis (ICD10-I70.0). Electronically Signed   By: Marijo Conception M.D.   On: 10/29/2018 07:56   Dg Chest Port 1 View  Result Date: 10/26/2018 CLINICAL DATA:  Increased shortness of breath. EXAM: PORTABLE CHEST 1 VIEW COMPARISON:  10/22/2018. FINDINGS: Cardiomegaly. Diffuse BILATERAL interstitial  and alveolar lung disease not significantly changed from priors. No pneumothorax or effusion. Thoracic atherosclerosis. Bones unremarkable. IMPRESSION: Stable chest. Electronically Signed   By: Staci Righter M.D.   On: 10/26/2018 07:01   Dg Chest Port 1 View  Result Date: 10/22/2018 CLINICAL DATA:  Shortness of breath. EXAM: PORTABLE CHEST 1 VIEW COMPARISON:  Chest x-ray dated October 20, 2018. FINDINGS: Stable cardiomediastinal silhouette. Bilateral diffuse interstitial and alveolar airspace opacities with peripheral predominance again noted, slightly worsened in both upper lobes. No pleural effusion or pneumothorax. No acute osseous abnormality. IMPRESSION: 1. Peripheral predominant bilateral interstitial and alveolar airspace disease, worsened in both upper lobes. Electronically Signed   By: Titus Dubin M.D.   On: 10/22/2018 19:41   Dg Chest Portable 1 View  Result Date: 10/20/2018 CLINICAL DATA:  Shortness of breath, hypoxia EXAM: PORTABLE CHEST 1 VIEW COMPARISON:  09/24/2018 FINDINGS: Bilateral diffuse interstitial and alveolar airspace opacities with a peripheral predominance. No pleural effusion or pneumothorax. Stable cardiomediastinal silhouette. No aggressive osseous lesion. IMPRESSION: 1. Chronic interstitial lung disease. 2. Increased bilateral interstitial and alveolar airspace opacities with a peripheral  predominance. Differential considerations include multilobar pneumonia including atypical infection such as viral pneumonia. 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           -Significantly improved now on 2L/min nasal cannula - will order exertional walk to delineate O2 requirement and hopefully send home with oxygen today. -ID and Cardiology on case - appreciate input -continue prednisone 40 mg p.o. daily plan for 2 months slow taper- on bactrim for PCP pp - pulmonary clinic follow up will set up post d/c -Atelectasis - please provide patient with Acapella flutter valve and IS to take home with him. Chest PT today.  -please encourage patient to use every hour several times.  -please continue to work with physical therapy      Thank you for allowing me to participate in the care of this patient.     Patient/Family are satisfied with care plan and all questions have been answered.  This document was prepared using Dragon voice recognition software and may include unintentional dictation errors.     Ottie Glazier, M.D.  Division of [facility]

## 2018-10-31 NOTE — Care Management Important Message (Signed)
Important Message  Patient Details  Name: AMAR SIPPEL MRN: [id number] Date of Birth: 1927/05/26   Medicare Important Message Given:  Yes    Dannette Barbara 10/31/2018, 1:18 PM

## 2018-10-31 NOTE — Progress Notes (Signed)
Physical Therapy Treatment Patient Details Name: Joseph Barton MRN: [id number] DOB: [birth year] Today's Date: 10/31/2018    History of Present Illness  85 y.o. male with a known history of pneumonia in the past, chronic systolic heart failure with EF of 35%, coronary artery disease, abdominal aortic aneurysm, hyperlipidemia, hypertension, mitral regurgitation, atrial fibrillation on Coumadin presented to emergency room for shortness of breath and cough.   Has generalized weakness.  Recently treated for pneumonia (tested (-) for coronavirus)    PT Comments    Pt agreeable to PT session but c/o B hand itching; nurse notified of pt's request for Benadryl.  Pt eager to walk and hopeful for discharge home today.  O2 sats 90% on 2 L O2 via nasal cannula at rest beginning of session (nurse requesting therapist walk pt to door and back on 2 L O2).  Pt stood fairly well from bed with CGA and then ambulated to door and back with RW with increasing assist required; initially pt CGA with step through gait pattern but towards end of ambulation pt started to have difficulty staying within RW requiring min assist (vc's and physical assist); pt then becoming less and less responsive and leaning forward over walker requiring mod to max assist to bring back upright and sit onto bed safely (2nd assist present assisting for safety).  O2 sats noted to be 71% on 2 L O2 via nasal cannula and O2 very slow to increase so therapist increased O2 to 3 L and O2 sats improved up to 84% but unable to get any higher so eventually increased to 4L and pt's O2 sats increased to 90-91%.  Pt's responsiveness gradually improved as well as SOB improved with sitting rest break on edge of bed.  Respiratory arrived and started to ween pt back to 2 L via nasal cannula and present to give pt breathing treatment.  Nurse then arrived and notified of above concerns and O2 sats.  Will continue to progress pt with strengthening and progressive functional  mobility per pt tolerance.   Follow Up Recommendations  SNF     Equipment Recommendations  Rolling walker with 5" wheels    Recommendations for Other Services       Precautions / Restrictions Precautions Precautions: Fall Restrictions Weight Bearing Restrictions: No    Mobility  Bed Mobility Overal bed mobility: Modified Independent Bed Mobility: Supine to Sit           General bed mobility comments: Able to sit up without assist  Transfers Overall transfer level: Needs assistance Equipment used: Rolling walker (2 wheeled) Transfers: Sit to/from Stand Sit to Stand: Min guard         General transfer comment: fairly strong stand x2 trials from bed with RW; CGA stand step turn bed to recliner with RW  Ambulation/Gait Ambulation/Gait assistance: Min guard;Min assist;Mod assist;Max assist Gait Distance (Feet): 40 Feet Assistive device: Rolling walker (2 wheeled)   Gait velocity: decreased   General Gait Details: pt initially steady with CGA ambulating 30 feet with RW; then pt started to have difficulty staying within RW requiring min assist (vc's and physical assist); pt becoming less responsive and leaning forward over walker requiring mod to max assist to bring back upright and sit onto bed safely (2nd assist present assisting for safety)   Stairs             Wheelchair Mobility    Modified Rankin (Stroke Patients Only)       Balance Overall balance assessment:  Needs assistance Sitting-balance support: No upper extremity supported Sitting balance-Leahy Scale: Good Sitting balance - Comments: steady sitting reaching within BOS   Standing balance support: No upper extremity supported Standing balance-Leahy Scale: Fair Standing balance comment: steady static standing; requires UE support for ambulation                            Cognition Arousal/Alertness: Awake/alert Behavior During Therapy: WFL for tasks  assessed/performed Overall Cognitive Status: Within Functional Limits for tasks assessed                                        Exercises      General Comments   Nursing cleared pt for participation in physical therapy.  Pt agreeable to PT session.      Pertinent Vitals/Pain Pain Assessment: No/denies pain  HR WFL during session.    Home Living                      Prior Function            PT Goals (current goals can now be found in the care plan section) Acute Rehab PT Goals Patient Stated Goal: Go home PT Goal Formulation: With patient Time For Goal Achievement: 11/07/18 Potential to Achieve Goals: Fair Progress towards PT goals: Progressing toward goals    Frequency    Min 2X/week      PT Plan Current plan remains appropriate    Co-evaluation              AM-PAC PT "6 Clicks" Mobility   Outcome Measure  Help needed turning from your back to your side while in a flat bed without using bedrails?: None Help needed moving from lying on your back to sitting on the side of a flat bed without using bedrails?: None Help needed moving to and from a bed to a chair (including a wheelchair)?: A Little Help needed standing up from a chair using your arms (e.g., wheelchair or bedside chair)?: A Little Help needed to walk in hospital room?: A Lot Help needed climbing 3-5 steps with a railing? : Total 6 Click Score: 17    End of Session Equipment Utilized During Treatment: Gait belt;Oxygen Activity Tolerance: Treatment limited secondary to medical complications (Comment)(Decreased responsiveness with activity) Patient left: in chair;with call bell/phone within reach;with chair alarm set;with nursing/sitter in room;Other (comment)(respiratory present) Nurse Communication: Mobility status;Other (comment);Precautions(O2 sats and symptoms) PT Visit Diagnosis: Muscle weakness (generalized) (M62.81);Difficulty in walking, not elsewhere classified  (R26.2)     Time: 8889-1694 PT Time Calculation (min) (ACUTE ONLY): 39 min  Charges:  $Gait Training: 8-22 mins $Therapeutic Exercise: 8-22 mins $Therapeutic Activity: 8-22 mins                    Leitha Bleak, PT 10/31/18, 3:29 PM [phone]

## 2018-10-31 NOTE — Progress Notes (Signed)
PT recommending rehab.  Pt prefers to go home with Home Health RN and PT.  Discussed with patient the pros and cons of both options, and that the ultimate goal is for him to recover in a safe environment.  After deliberating, pt stated that he is amenable to the idea of going to rehab temporarily and then home. MD updated regarding pt's preference.

## 2018-10-31 NOTE — Plan of Care (Signed)
  Problem: Health Behavior/Discharge Planning: Goal: Ability to manage health-related needs will improve Outcome: Progressing   Problem: Clinical Measurements: Goal: Ability to maintain clinical measurements within normal limits will improve Outcome: Progressing Goal: Will remain free from infection Outcome: Progressing Goal: Diagnostic test results will improve Outcome: Progressing Goal: Respiratory complications will improve Outcome: Progressing Note:  Patient requires chronic oxygen, denies dyspnea.

## 2018-10-31 NOTE — Plan of Care (Signed)
Weaned oxygen down to 2L/min.  Tolerated well with SpO2 90% maintained.  Problem: Health Behavior/Discharge Planning: Goal: Ability to manage health-related needs will improve Outcome: Progressing   Problem: Clinical Measurements: Goal: Ability to maintain clinical measurements within normal limits will improve Outcome: Progressing Goal: Will remain free from infection Outcome: Progressing Goal: Diagnostic test results will improve Outcome: Progressing Goal: Respiratory complications will improve Outcome: Progressing Goal: Cardiovascular complication will be avoided Outcome: Progressing   Problem: Nutrition: Goal: Adequate nutrition will be maintained Outcome: Progressing   Problem: Safety: Goal: Ability to remain free from injury will improve Outcome: Progressing   Problem: Activity: Goal: Ability to tolerate increased activity will improve Outcome: Progressing   Problem: Clinical Measurements: Goal: Ability to maintain a body temperature in the normal range will improve Outcome: Progressing   Problem: Respiratory: Goal: Ability to maintain adequate ventilation will improve Outcome: Progressing Goal: Ability to maintain a clear airway will improve Outcome: Progressing

## 2018-10-31 NOTE — Progress Notes (Signed)
[facility] NAME: Joseph Barton    MR#:  [id number]  DATE OF BIRTH:  1926-11-25  SUBJECTIVE:  CHIEF COMPLAINT:   Chief Complaint  Patient presents with  . Weakness   Patient had an episode of chest discomfort yesterday which is resolved.  Troponin was negative.  Twelve-lead EKG unremarkable.  Rested better last night.  No fevers overnight. Complaint of stuffy nose yesterday but after starting Afrin spray he is feeling much better and on 2 L oxygen via nasal cannula now, while resting.  He says he does not drop significantly on exertion of walking up to the door of his room up to 70% and had to bump up to 4 L for next 4 to 5 minutes before he returns back to comfortable on 2 L.  REVIEW OF SYSTEMS:  Review of Systems  Constitutional: Negative for chills and fever.  HENT: Negative for hearing loss and tinnitus.   Eyes: Negative for blurred vision and double vision.  Respiratory: Positive for cough and shortness of breath.        Shortness of breath improving  Cardiovascular: Negative for chest pain and palpitations.  Gastrointestinal: Negative for abdominal pain, heartburn, nausea and vomiting.  Genitourinary: Negative for dysuria and urgency.  Musculoskeletal: Negative for myalgias and neck pain.  Skin: Negative for itching and rash.  Neurological: Negative for dizziness and headaches.  Psychiatric/Behavioral: Negative for depression and hallucinations.   DRUG ALLERGIES:   Allergies  Allergen Reactions  . Atorvastatin   . Simvastatin    VITALS:  Blood pressure 94/61, pulse (!) 53, temperature 97.6 F (36.4 C), temperature source Oral, resp. rate 16, height 6\' 2"  (1.88 m), weight 78.6 kg, SpO2 93 %. PHYSICAL EXAMINATION:   Physical Exam  Constitutional: He is oriented to person, place, and time. He appears well-developed and well-nourished.  HENT:  Head: Normocephalic and atraumatic.  Right Ear: External ear normal.  Eyes:  Pupils are equal, round, and reactive to light. Conjunctivae are normal. Right eye exhibits no discharge. No scleral icterus.  Neck: Normal range of motion. Neck supple. No tracheal deviation present.  Cardiovascular:  Irregularly irregular  Respiratory: Effort normal. He has rales.  GI: Soft. Bowel sounds are normal. There is no abdominal tenderness.  Musculoskeletal: Normal range of motion.        General: No edema.  Neurological: He is alert and oriented to person, place, and time. No cranial nerve deficit.  Skin: Skin is warm. He is not diaphoretic. No erythema.  Psychiatric: He has a normal mood and affect. His behavior is normal.   LABORATORY PANEL:  Male CBC Recent Labs  Lab 10/29/18 0512  WBC 9.9  HGB 13.6  HCT 41.3  PLT 161   ------------------------------------------------------------------------------------------------------------------ Chemistries  Recent Labs  Lab 10/26/18 0603  10/29/18 0512  NA 135   < > 134*  K 4.3   < > 4.8  CL 100   < > 97*  CO2 27   < > 28  GLUCOSE 101*   < > 128*  BUN 35*   < > 32*  CREATININE 1.03   < > 1.09  CALCIUM 8.4*   < > 8.7*  MG 2.2  --   --    < > = values in this interval not displayed.   RADIOLOGY:  Dg Chest Port 1 View  Result Date: 10/31/2018 CLINICAL DATA:  Pneumonia. EXAM: PORTABLE CHEST 1 VIEW COMPARISON:  10/29/2018 FINDINGS: Stable mild  cardiac enlargement. Severe bilateral pneumonia again noted superimposed on chronic lung disease. Density of peripheral consolidation in the right upper lobe appears slightly improved. Other areas of airspace disease bilaterally appear stable. Lung volumes are stable. No evidence of pleural fluid or pneumothorax. IMPRESSION: Relatively stable severe bilateral pneumonia with slightly improved density of consolidation in the peripheral right upper lobe. Electronically Signed   By: Aletta Edouard M.D.   On: 10/31/2018 08:21   ASSESSMENT AND PLAN:  85 year old male patient with a known  history of pneumonia in the past, chronic systolic heart failure with EF of 25-30%, coronary artery disease, abdominal aortic aneurysm, hyperlipidemia, hypertension, mitral regurgitation, atrial fibrillation on Coumadin presented to emergency room for shortness of breath and cough.   1.Acute respiratory failure with hypoxia Initially felt to be secondary to pneumonia.  Patient evaluated by infectious disease.  Patient was not responding to antibiotics.  Antibiotics already discontinued since no clinical evidence to support pneumonia. Pneumonia ruled out. Recent CT chest without contrast done for further evaluation revealed interval development of diffuse and interstitial densities throughout both lungs concerning for interstitial or atypical pneumonia or possibly edema. Bronchiectasis is noted in both lower lobes. 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  Patient has been on amiodarone since January 2020.  Appreciate input from infectious disease specialist and pulmonologist. Amiodarone already discontinued.   Patient started on prednisone 40 mg p.o. daily with plans for 2 months slow taper.   Infectious disease specialist recommended Bactrim SS 1 tablet daily for PCP prophylaxis since patient is going to be on prednisone for more than 30 days which has been ordered Gradually weaning down oxygen requirement.    Patient will most likely require home oxygen therapy on discharge.   follow-up with pulmonary clinic post discharge. Patient will also need Acapella flutter valve and incentive spirometer to take home with him on discharge. [id number] test was negative as such COVID infection ruled out. Pulmonary embolism very unlikely since patient already on anticoagulation with INR supratherapeutic at 4.1-now INR normalized and started back on warfarin.  2.Multilobar pneumonia ruled out Has been ruled out.  Respiratory symptoms confirmed to be due to amiodarone induced lung toxicity  Initially managed on broad-spectrum IV antibiotics with vancomycin and cefepime which has been previously discontinued.  So far no growth on blood cultures. Coronavirus test negative.  COVID 19 ruled out  3.Chronic atrial fibrillation Patient on Coumadin with INR supratherapeutic at 4.1.  Pharmacy assisting with dosing as INR < 2.5 now. Amiodarone discontinued during this admission due to pulmonary toxicity.   cardiologist Dr.  Vilinda Blanks small dose of digoxin and metoprolol for rate control.   Electrophysiologist upon discharge   4. Hypertension Blood pressure controlled on current regimen  Lisinopril discontinued recently due to borderline low blood pressures  5.  Chronic systolic CHF Ejection fraction of 25 to 30%.  Lasix was initially placed on hold on admission due to concern for dehydration. Due to worsening respiratory status, Lasix already resumed  Continue beta-blocker.  Lisinopril on hold due to blood pressure being borderline recently.  6.  Stuffy nose Patient was seen by ENT in the past. He was taking some decongestant pills over-the-counter at home. On his request I have spoken to ENT physician.  They suggested due to COVID-19 they are not doing any endoscopic procedures currently on elective basis but he suggested to give Afrin spray.  Advised to follow in the clinic if he symptomatically feels better. Patient is already feeling better .  DVT prophylaxis; pharmacist dosing Coumadin.     Physical therapy has recommended placement to rehab.  Patient is hopeful to be able to go home after some more recovery in the hospital.  All the records are reviewed and case discussed with Care Management/Social Worker. Management plans discussed with the patient, and they are in agreement.  CODE STATUS: DNR  TOTAL TIME TAKING CARE OF THIS PATIENT: 33 minutes.   More than 50% of the time was spent in counseling/coordination of care: YES  POSSIBLE D/C IN 1-2 DAYS, DEPENDING  ON CLINICAL CONDITION. Spoke to patient's wife on phone.  As patient's oxygen saturation still dropping significantly with him feeling symptomatic on minimal exertion, we will keep him in the hospital and likely discharge in 1-2 days.  Vaughan Basta M.D on 10/31/2018 at 2:57 PM  Between 7am to 6pm - Pager - [phone]  After 6pm go to [email or web address] - password [email or web address] [facility]  [facility]  Office  [phone]  CC: Primary care physician; Jerrol Banana., MD  Note: This dictation was prepared with Dragon dictation along with smaller phrase technology. Any transcriptional errors that result from this process are unintentional.

## 2018-11-01 DIAGNOSIS — J9600 Acute respiratory failure, unspecified whether with hypoxia or hypercapnia: Secondary | ICD-10-CM | POA: Diagnosis not present

## 2018-11-01 DIAGNOSIS — R6510 Systemic inflammatory response syndrome (SIRS) of non-infectious origin without acute organ dysfunction: Secondary | ICD-10-CM | POA: Diagnosis not present

## 2018-11-01 DIAGNOSIS — R001 Bradycardia, unspecified: Secondary | ICD-10-CM | POA: Diagnosis not present

## 2018-11-01 DIAGNOSIS — J9811 Atelectasis: Secondary | ICD-10-CM | POA: Diagnosis not present

## 2018-11-01 DIAGNOSIS — T462X1A Poisoning by other antidysrhythmic drugs, accidental (unintentional), initial encounter: Secondary | ICD-10-CM | POA: Diagnosis not present

## 2018-11-01 DIAGNOSIS — M2550 Pain in unspecified joint: Secondary | ICD-10-CM | POA: Diagnosis not present

## 2018-11-01 DIAGNOSIS — R5381 Other malaise: Secondary | ICD-10-CM | POA: Diagnosis not present

## 2018-11-01 DIAGNOSIS — R0902 Hypoxemia: Secondary | ICD-10-CM | POA: Diagnosis not present

## 2018-11-01 DIAGNOSIS — R279 Unspecified lack of coordination: Secondary | ICD-10-CM | POA: Diagnosis not present

## 2018-11-01 DIAGNOSIS — I4891 Unspecified atrial fibrillation: Secondary | ICD-10-CM | POA: Diagnosis not present

## 2018-11-01 DIAGNOSIS — Z743 Need for continuous supervision: Secondary | ICD-10-CM | POA: Diagnosis not present

## 2018-11-01 DIAGNOSIS — I5023 Acute on chronic systolic (congestive) heart failure: Secondary | ICD-10-CM | POA: Diagnosis not present

## 2018-11-01 DIAGNOSIS — J9601 Acute respiratory failure with hypoxia: Secondary | ICD-10-CM | POA: Diagnosis not present

## 2018-11-01 DIAGNOSIS — J984 Other disorders of lung: Secondary | ICD-10-CM | POA: Diagnosis not present

## 2018-11-01 DIAGNOSIS — I509 Heart failure, unspecified: Secondary | ICD-10-CM | POA: Diagnosis not present

## 2018-11-01 DIAGNOSIS — Z7401 Bed confinement status: Secondary | ICD-10-CM | POA: Diagnosis not present

## 2018-11-01 LAB — CBC
HCT: 41.4 % (ref 39.0–52.0)
Hemoglobin: 13.6 g/dL (ref 13.0–17.0)
MCH: 30.1 pg (ref 26.0–34.0)
MCHC: 32.9 g/dL (ref 30.0–36.0)
MCV: 91.6 fL (ref 80.0–100.0)
Platelets: 170 10*3/uL (ref 150–400)
RBC: 4.52 MIL/uL (ref 4.22–5.81)
RDW: 14 % (ref 11.5–15.5)
WBC: 8.8 10*3/uL (ref 4.0–10.5)
nRBC: 0 % (ref 0.0–0.2)

## 2018-11-01 LAB — PROTIME-INR
INR: 2.5 — ABNORMAL HIGH (ref 0.8–1.2)
Prothrombin Time: 26.9 seconds — ABNORMAL HIGH (ref 11.4–15.2)

## 2018-11-01 MED ORDER — BENZONATATE 100 MG PO CAPS: 100.0000 mg | ORAL_CAPSULE | Freq: Three times a day (TID) | ORAL | 0 refills | Status: AC | PRN

## 2018-11-01 MED ORDER — FLUTICASONE PROPIONATE 50 MCG/ACT NA SUSP: 1.0000 | Freq: Every day | NASAL | 2 refills | Status: AC

## 2018-11-01 MED ORDER — WARFARIN SODIUM 1 MG PO TABS
1.0000 mg | ORAL_TABLET | Freq: Once | ORAL | Status: AC
  Administered 2018-11-01: 1 mg via ORAL
  Filled 2018-11-01: qty 1

## 2018-11-01 MED ORDER — PREDNISONE 20 MG PO TABS: ORAL_TABLET | ORAL | 0 refills | Status: AC

## 2018-11-01 MED ORDER — IPRATROPIUM-ALBUTEROL 0.5-2.5 (3) MG/3ML IN SOLN: 3.0000 mL | Freq: Two times a day (BID) | RESPIRATORY_TRACT | Status: DC

## 2018-11-01 MED ORDER — IPRATROPIUM-ALBUTEROL 0.5-2.5 (3) MG/3ML IN SOLN: 3.0000 mL | Freq: Two times a day (BID) | RESPIRATORY_TRACT | 0 refills | Status: AC

## 2018-11-01 MED ORDER — OXYMETAZOLINE HCL 0.05 % NA SOLN: 1.0000 | Freq: Two times a day (BID) | NASAL | 0 refills | Status: AC

## 2018-11-01 MED ORDER — BISACODYL 5 MG PO TBEC: 5.0000 mg | DELAYED_RELEASE_TABLET | Freq: Every day | ORAL | 0 refills | Status: AC | PRN

## 2018-11-01 MED ORDER — WARFARIN SODIUM 1 MG PO TABS: 1.0000 mg | ORAL_TABLET | Freq: Every day | ORAL | 0 refills | Status: AC

## 2018-11-01 MED ORDER — TRAZODONE HCL 50 MG PO TABS: 50.0000 mg | ORAL_TABLET | Freq: Every evening | ORAL | 0 refills | Status: AC | PRN

## 2018-11-01 MED ORDER — SULFAMETHOXAZOLE-TRIMETHOPRIM 400-80 MG PO TABS: 1.0000 | ORAL_TABLET | Freq: Every day | ORAL | 1 refills | Status: AC

## 2018-11-01 MED ORDER — ADULT MULTIVITAMIN W/MINERALS CH: 1.0000 | ORAL_TABLET | Freq: Every day | ORAL | 0 refills | Status: AC

## 2018-11-01 MED ORDER — DOCUSATE SODIUM 100 MG PO CAPS: 200.0000 mg | ORAL_CAPSULE | Freq: Two times a day (BID) | ORAL | 0 refills | Status: AC

## 2018-11-01 MED ORDER — GUAIFENESIN-DM 100-10 MG/5ML PO SYRP: 5.0000 mL | ORAL_SOLUTION | ORAL | 0 refills | Status: AC | PRN

## 2018-11-01 NOTE — Progress Notes (Signed)
PT Cancellation Note  Patient Details Name: Joseph Barton MRN: [id number] DOB: Nov 12, 1926   Cancelled Treatment:    Reason Eval/Treat Not Completed: Other (comment).  Chart reviewed.  Per recent nursing note:  "DR Anselm Jungling was made aware of patient having a slow ventricular respond with a 2.58 sec pause. . Continue to monitor patient and to informed cardiologist."  Telemetry showing irregular HR.  Will hold PT at this time and will re-attempt PT treatment session at a later date/time as medically appropriate.  Leitha Bleak, PT 11/01/18, 11:24 AM [phone]

## 2018-11-01 NOTE — Progress Notes (Signed)
Christell Faith PA was informed , continue to monitor pt

## 2018-11-01 NOTE — Plan of Care (Signed)
  Problem: Health Behavior/Discharge Planning: Goal: Ability to manage health-related needs will improve Outcome: Progressing   Problem: Clinical Measurements: Goal: Ability to maintain clinical measurements within normal limits will improve Outcome: Progressing Goal: Will remain free from infection Outcome: Progressing Goal: Diagnostic test results will improve Outcome: Progressing Goal: Respiratory complications will improve Outcome: Progressing Goal: Cardiovascular complication will be avoided Outcome: Progressing   Problem: Nutrition: Goal: Adequate nutrition will be maintained Outcome: Progressing   Problem: Safety: Goal: Ability to remain free from injury will improve Outcome: Progressing   Problem: Activity: Goal: Ability to tolerate increased activity will improve Outcome: Progressing   Problem: Clinical Measurements: Goal: Ability to maintain a body temperature in the normal range will improve Outcome: Progressing   Problem: Respiratory: Goal: Ability to maintain adequate ventilation will improve Outcome: Progressing Goal: Ability to maintain a clear airway will improve Outcome: Progressing

## 2018-11-01 NOTE — Progress Notes (Signed)
Notified of 2.5 second R-R interval with known permanent Afib. Asymptomatic. Ok to hold metoprolol given BP 99 systolic for now.

## 2018-11-01 NOTE — Discharge Instructions (Signed)
Need to follow INR level in 3-4 days as dose adjusted.

## 2018-11-01 NOTE — Discharge Summary (Signed)
[facility] NAME: Joseph Barton    MR#:  [id number]  DATE OF BIRTH:  09/18/1926  DATE OF ADMISSION:  10/20/2018 ADMITTING PHYSICIAN: Saundra Shelling, MD  DATE OF DISCHARGE: 11/01/2018   PRIMARY CARE PHYSICIAN: Jerrol Banana., MD    ADMISSION DIAGNOSIS:  Healthcare-associated pneumonia [J18.9] Acute respiratory failure with hypoxia ([place]) [J96.01]  DISCHARGE DIAGNOSIS:  Active Problems:  Amiodarone induced lung injury.   Acute on chronic systolic heart failure (HCC)   Acute respiratory failure with hypoxia (HCC)   SECONDARY DIAGNOSIS:   Past Medical History:  Diagnosis Date  . Abdominal aortic aneurysm ([place]) 01-2004   4.7 x 4.7 cm.  Marland Kitchen CAD (coronary artery disease) 01/25/2004   a. 01/2004 Ant MI/DES to LAD;  b. 10/2011 Neg MV.  . Chronic systolic heart failure ([place])    a. 08/2016 Echo: EF 35%, nl RV fxn, mod to sev MR, mild to mod TR, mod biatrial enlargement.  . Hyperlipidemia   . Hypertension   . Ischemic cardiomyopathy    a. 08/2016 Echo: EF 35%.  . Moderate to Severe Mitral regurgitation    a. 08/2016 Echo: Mod-sev MR.  Marland Kitchen Permanent atrial fibrillation    a. Chronic coumadin (CHA2DS2VASc = 5).  . Pulmonary nodules    Noted on abdominal CT  . Statin intolerance     HOSPITAL COURSE:   85 year old male patientwith a known history ofpneumonia in the past, chronic systolic heart failure with EF of 25-30%, coronary artery disease, abdominal aortic aneurysm, hyperlipidemia, hypertension, mitral regurgitation, atrial fibrillation on Coumadin presented to emergency room for shortness of breath and cough.   1.Acute respiratory failure with hypoxia Initially felt to be secondary to pneumonia.  Patient evaluated by infectious disease.  Patient was not responding to antibiotics.  Antibiotics already discontinued since no clinical evidence to support pneumonia. Pneumonia ruled out. Recent CT chest without contrast done  for further evaluation revealed interval development of diffuse and interstitial densities throughout both lungs concerning for interstitial or atypical pneumonia or possibly edema. Bronchiectasis is noted in both lower lobes. 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  Patient has been on amiodarone since January 2020.  Appreciate input from infectious disease specialist and pulmonologist. Amiodarone already discontinued.   Patient started on prednisone 40 mg p.o. daily with plans for 2 months slow taper.   Infectious disease specialist recommended Bactrim SS 1 tablet daily for PCP prophylaxis since patient is going to be on prednisone for more than 30 days which has been ordered Gradually weaning down oxygen requirement.   follow-up with pulmonary clinic post discharge. Patient will also need Acapella flutter valve and incentive spirometer to take home with him on discharge. [id number] test was negative as such COVID infection ruled out. Pulmonary embolism very unlikely since patient already on anticoagulation with INR supratherapeutic at 4.1-now INR normalized and started back on warfarin.  2.Multilobar pneumonia ruled out Has been ruled out.  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  So far no growth on blood cultures. Coronavirus test negative.  COVID 19 ruled out  3.Chronic atrial fibrillation Patient on Coumadin with INR supratherapeutic at 4.1.  Pharmacy assisting with dosing as INR < 2.5 now- stable. Advise to check INR in 3-4 days at the facility to adjust coumadin dose. Amiodarone discontinued during this admission due to pulmonary toxicity.  cardiologist Dr. Vilinda Blanks small dose of digoxin and metoprolol for rate control.   Electrophysiologist upon discharge   Stopped metoprolol as HR and BP on lower  normal side now.  4. Hypertension Blood pressure controlled on current regimen  Lisinopril discontinued recently due to borderline low blood pressures  5.  Chronic systolic CHF Ejection fraction of 25 to 30%.  Lasix was initially placed on hold on admission due to concern for dehydration. Due to worsening respiratory status, Lasix already resumed  Continue beta-blocker.  Lisinopril on hold due to blood pressure being borderline recently.  6.  Stuffy nose Patient was seen by ENT in the past. He was taking some decongestant pills over-the-counter at home. On his request I have spoken to ENT physician.  They suggested due to COVID-19 they are not doing any endoscopic procedures currently on elective basis but he suggested to give Afrin spray for 5-6 days.  Advised to follow in the clinic if he symptomatically feels better. Patient is already feeling better .  DVT prophylaxis; pharmacist dosing Coumadin.     DISCHARGE CONDITIONS:   Stable.  CONSULTS OBTAINED:  Treatment Team:  Ottie Glazier, MD  DRUG ALLERGIES:   Allergies  Allergen Reactions  . Atorvastatin   . Simvastatin     DISCHARGE MEDICATIONS:   Allergies as of 11/01/2018      Reactions   Atorvastatin    Simvastatin       Medication List    STOP taking these medications   amiodarone 200 MG tablet Commonly known as:  PACERONE   lisinopril 2.5 MG tablet Commonly known as:  ZESTRIL   metoprolol succinate 25 MG 24 hr tablet Commonly known as:  TOPROL-XL   Potassium Chloride ER 20 MEQ Tbcr     TAKE these medications   albuterol 108 (90 Base) MCG/ACT inhaler Commonly known as:  VENTOLIN HFA Inhale 2 puffs into the lungs every 6 (six) hours as needed for wheezing or shortness of breath.   benzonatate 100 MG capsule Commonly known as:  TESSALON Take 1 capsule (100 mg total) by mouth 3 (three) times daily as needed for cough.   BIOFLEX PO Take 1 tablet by mouth daily.   bisacodyl 5 MG EC  tablet Commonly known as:  DULCOLAX Take 1 tablet (5 mg total) by mouth daily as needed for moderate constipation.   digoxin 0.125 MG tablet Commonly known as:  LANOXIN Take 0.0625 mg by mouth daily.   docusate sodium 100 MG capsule Commonly known as:  COLACE Take 2 capsules (200 mg total) by mouth 2 (two) times daily.   fluticasone 50 MCG/ACT nasal spray Commonly known as:  FLONASE Place 1 spray into both nostrils daily. Start taking on:  Nov 02, 2018   furosemide 20 MG tablet Commonly known as:  LASIX Take 40 mg by mouth daily.   guaiFENesin-dextromethorphan 100-10 MG/5ML syrup Commonly known as:  ROBITUSSIN DM Take 5 mLs by mouth every 4 (four) hours as needed for cough.   ipratropium-albuterol 0.5-2.5 (3) MG/3ML Soln Commonly known as:  DUONEB Take 3 mLs by nebulization 2 (two) times daily.   multivitamin with minerals Tabs tablet Take 1 tablet by mouth daily. Start taking on:  Nov 02, 2018   omeprazole 20 MG capsule Commonly known as:  PRILOSEC TAKE 1 CAPSULE BY MOUTH  DAILY   oxymetazoline 0.05 % nasal spray Commonly known as:  AFRIN Place 1 spray into both nostrils 2 (two) times daily for 3 days. Then STOP.   polyethylene  glycol powder 17 GM/SCOOP powder Commonly known as:  GlycoLax Take 1 scoop by mouth daily.   predniSONE 20 MG tablet Commonly known as:  DELTASONE Use 40 mg daily for 20 days- until 11/22/18, then take 20 mg daily for next 20 days until 12/12/18., then take 10 mg daily for next 20 days until 12/31/18.   sulfamethoxazole-trimethoprim 400-80 MG tablet Commonly known as:  BACTRIM Take 1 tablet by mouth daily. Start taking on:  Nov 02, 2018   tamsulosin 0.4 MG Caps capsule Commonly known as:  FLOMAX Take 1 capsule (0.4 mg total) by mouth daily.   traZODone 50 MG tablet Commonly known as:  DESYREL Take 1 tablet (50 mg total) by mouth at bedtime as needed for sleep.   warfarin 1 MG tablet Commonly known as:  COUMADIN Take as directed. If  you are unsure how to take this medication, talk to your nurse or doctor. Original instructions:  Take 1 tablet (1 mg total) by mouth daily at 6 PM. What changed:    medication strength  how much to take  when to take this        DISCHARGE INSTRUCTIONS:   Follow with PMD in 1-2 weeks. Need to check INR in 3-4 days to adjust Coumadin dose.  If you experience worsening of your admission symptoms, develop shortness of breath, life threatening emergency, suicidal or homicidal thoughts you must seek medical attention immediately by calling 911 or calling your MD immediately  if symptoms less severe.  You Must read complete instructions/literature along with all the possible adverse reactions/side effects for all the Medicines you take and that have been prescribed to you. Take any new Medicines after you have completely understood and accept all the possible adverse reactions/side effects.   Please note  You were cared for by a hospitalist during your hospital stay. If you have any questions about your discharge medications or the care you received while you were in the hospital after you are discharged, you can call the unit and asked to speak with the hospitalist on call if the hospitalist that took care of you is not available. Once you are discharged, your primary care physician will handle any further medical issues. Please note that NO REFILLS for any discharge medications will be authorized once you are discharged, as it is imperative that you return to your primary care physician (or establish a relationship with a primary care physician if you do not have one) for your aftercare needs so that they can reassess your need for medications and monitor your lab values.    Today   CHIEF COMPLAINT:   Chief Complaint  Patient presents with  . Weakness    HISTORY OF PRESENT ILLNESS:  Joseph Barton  is a 85 y.o. male with a known history of pneumonia in the past, chronic systolic heart  failure with EF of 35%, coronary artery disease, abdominal aortic aneurysm, hyperlipidemia, hypertension, mitral regurgitation, atrial fibrillation on Coumadin presented to emergency room for shortness of breath and cough.   Has generalized weakness.  Recently treated for pneumonia.  She was evaluated and tested for coronavirus.  Test result was negative.  Patient was started on broad-spectrum IV antibiotics that is vancomycin and cefepime chest x-ray revealed pneumonia .  Lactic acid was elevated and patient received IV fluid bolus 500 mL in the emergency room considering his congestive heart failure history and poor EF.  Currently is on oxygen via nasal cannula at 4 L.  No history of  travel.  No history of any international visitors meeding him.  No sick contacts at home.   VITAL SIGNS:  Blood pressure 99/60, pulse 66, temperature 98.4 F (36.9 C), resp. rate 18, height 6\' 2"  (1.88 m), weight 80 kg, SpO2 96 %.  I/O:    Intake/Output Summary (Last 24 hours) at 11/01/2018 1347 Last data filed at 11/01/2018 1100 Gross per 24 hour  Intake 360 ml  Output 1100 ml  Net -740 ml    PHYSICAL EXAMINATION:   Constitutional: He is oriented to person, place, and time. He appears well-developed and well-nourished.  HENT:  Head: Normocephalic and atraumatic.  Right Ear: External ear normal.  Eyes: Pupils are equal, round, and reactive to light. Conjunctivae are normal. Right eye exhibits no discharge. No scleral icterus.  Neck: Normal range of motion. Neck supple. No tracheal deviation present.  Cardiovascular:  Irregularly irregular  Respiratory: Effort normal. He has rales.  GI: Soft. Bowel sounds are normal. There is no abdominal tenderness.  Musculoskeletal: Normal range of motion.        General: No edema.  Neurological: He is alert and oriented to person, place, and time. No cranial nerve deficit.  Skin: Skin is warm. He is not diaphoretic. No erythema.  Psychiatric: He has a normal mood and  affect. His behavior is normal.  DATA REVIEW:   CBC Recent Labs  Lab 11/01/18 0303  WBC 8.8  HGB 13.6  HCT 41.4  PLT 170    Chemistries  Recent Labs  Lab 10/26/18 0603  10/29/18 0512  NA 135   < > 134*  K 4.3   < > 4.8  CL 100   < > 97*  CO2 27   < > 28  GLUCOSE 101*   < > 128*  BUN 35*   < > 32*  CREATININE 1.03   < > 1.09  CALCIUM 8.4*   < > 8.7*  MG 2.2  --   --    < > = values in this interval not displayed.    Cardiac Enzymes No results for input(s): TROPONINI in the last 168 hours.  Microbiology Results  Results for orders placed or performed during the hospital encounter of 10/20/18  Blood culture (routine x 2)     Status: None   Collection Time: 10/20/18  6:37 PM  Result Value Ref Range Status   Specimen Description BLOOD LH  Final   Special Requests   Final    BOTTLES DRAWN AEROBIC AND ANAEROBIC Blood Culture adequate volume   Culture   Final    NO GROWTH 5 DAYS Performed at [facility], [address], [place], [place] [address]    Report Status 10/25/2018 FINAL  Final  Blood culture (routine x 2)     Status: None   Collection Time: 10/20/18  6:37 PM  Result Value Ref Range Status   Specimen Description BLOOD RAC  Final   Special Requests   Final    BOTTLES DRAWN AEROBIC AND ANAEROBIC Blood Culture adequate volume   Culture   Final    NO GROWTH 5 DAYS Performed at [facility], [address], [place], [place] [address]    Report Status 10/25/2018 FINAL  Final  SARS Coronavirus 2 [facility] order, Performed in [facility] hospital lab)     Status: None   Collection Time: 10/20/18  6:40 PM  Result Value Ref Range Status   SARS Coronavirus 2 NEGATIVE NEGATIVE Final    Comment: (NOTE)  If result is NEGATIVE SARS-CoV-2 target nucleic acids are NOT DETECTED. The SARS-CoV-2 RNA is generally detectable in upper and lower  respiratory specimens during the acute phase of infection. The lowest  concentration of SARS-CoV-2 viral  copies this assay can detect is 250  copies / mL. A negative result does not preclude SARS-CoV-2 infection  and should not be used as the sole basis for treatment or other  patient management decisions.  A negative result may occur with  improper specimen collection / handling, submission of specimen other  than nasopharyngeal swab, presence of viral mutation(s) within the  areas targeted by this assay, and inadequate number of viral copies  (<250 copies / mL). A negative result must be combined with clinical  observations, patient history, and epidemiological information. If result is POSITIVE SARS-CoV-2 target nucleic acids are DETECTED. The SARS-CoV-2 RNA is generally detectable in upper and lower  respiratory specimens dur ing the acute phase of infection.  Positive  results are indicative of active infection with SARS-CoV-2.  Clinical  correlation with patient history and other diagnostic information is  necessary to determine patient infection status.  Positive results do  not rule out bacterial infection or co-infection with other viruses. If result is PRESUMPTIVE POSTIVE SARS-CoV-2 nucleic acids MAY BE PRESENT.   A presumptive positive result was obtained on the submitted specimen  and confirmed on repeat testing.  While 2019 novel coronavirus  (SARS-CoV-2) nucleic acids may be present in the submitted sample  additional confirmatory testing may be necessary for epidemiological  and / or clinical management purposes  to differentiate between  SARS-CoV-2 and other Sarbecovirus currently known to infect humans.  If clinically indicated additional testing with an alternate test  methodology [phone]) is advised. The SARS-CoV-2 RNA is generally  detectable in upper and lower respiratory sp ecimens during the acute  phase of infection. The expected result is Negative. Fact Sheet for Patients:  StrictlyIdeas.no Fact Sheet for Healthcare  Providers: BankingDealers.co.za This test is not yet approved or cleared by the [place] FDA and has been authorized for detection and/or diagnosis of SARS-CoV-2 by FDA under an Emergency Use Authorization (EUA).  This EUA will remain in effect (meaning this test can be used) for the duration of the COVID-19 declaration under Section 564(b)(1) of the Act, 21 U.S.C. section 360bbb-3(b)(1), unless the authorization is terminated or revoked sooner. Performed at [facility], [address] [place], [place] [address]   MRSA PCR Screening     Status: None   Collection Time: 10/21/18 11:03 AM  Result Value Ref Range Status   MRSA by PCR NEGATIVE NEGATIVE Final    Comment:        The GeneXpert MRSA Assay (FDA approved for NASAL specimens only), is one component of a comprehensive MRSA colonization surveillance program. It is not intended to diagnose MRSA infection nor to guide or monitor treatment for MRSA infections. Performed at [facility], [address] [place], [place] [address]   Culture, sputum-assessment     Status: None   Collection Time: 10/21/18 11:21 AM  Result Value Ref Range Status   Specimen Description EXPECTORATED SPUTUM  Final   Special Requests NONE  Final   Sputum evaluation   Final    Sputum specimen not acceptable for testing.  Please recollect.   Performed at [facility], [address] [place],  [address]    Report Status 10/21/2018 FINAL  Final  Respiratory Panel by PCR  Status: None   Collection Time: 10/23/18  1:52 PM  Result Value Ref Range Status   Adenovirus NOT DETECTED NOT DETECTED Final   Coronavirus 229E NOT DETECTED NOT DETECTED Final    Comment: (NOTE) The Coronavirus on the Respiratory Panel, DOES NOT test for the novel  Coronavirus (2019 nCoV)    Coronavirus HKU1 NOT DETECTED NOT DETECTED Final   Coronavirus NL63 NOT DETECTED NOT DETECTED Final   Coronavirus OC43 NOT  DETECTED NOT DETECTED Final   Metapneumovirus NOT DETECTED NOT DETECTED Final   Rhinovirus / Enterovirus NOT DETECTED NOT DETECTED Final   Influenza A NOT DETECTED NOT DETECTED Final   Influenza B NOT DETECTED NOT DETECTED Final   Parainfluenza Virus 1 NOT DETECTED NOT DETECTED Final   Parainfluenza Virus 2 NOT DETECTED NOT DETECTED Final   Parainfluenza Virus 3 NOT DETECTED NOT DETECTED Final   Parainfluenza Virus 4 NOT DETECTED NOT DETECTED Final   Respiratory Syncytial Virus NOT DETECTED NOT DETECTED Final   Bordetella pertussis NOT DETECTED NOT DETECTED Final   Chlamydophila pneumoniae NOT DETECTED NOT DETECTED Final   Mycoplasma pneumoniae NOT DETECTED NOT DETECTED Final    Comment: Performed at [facility], [place] [address], [place], [place] [address]    RADIOLOGY:  Dg Chest Port 1 View  Result Date: 10/31/2018 CLINICAL DATA:  Pneumonia. EXAM: PORTABLE CHEST 1 VIEW COMPARISON:  10/29/2018 FINDINGS: Stable mild cardiac enlargement. Severe bilateral pneumonia again noted superimposed on chronic lung disease. Density of peripheral consolidation in the right upper lobe appears slightly improved. Other areas of airspace disease bilaterally appear stable. Lung volumes are stable. No evidence of pleural fluid or pneumothorax. IMPRESSION: Relatively stable severe bilateral pneumonia with slightly improved density of consolidation in the peripheral right upper lobe. Electronically Signed   By: Aletta Edouard M.D.   On: 10/31/2018 08:21    EKG:   Orders placed or performed during the hospital encounter of 10/20/18  . ED EKG  . ED EKG  . EKG 12-Lead  . EKG 12-Lead  . EKG 12-Lead  . EKG 12-Lead  . EKG - 12 lead  . EKG - 12 lead  . EKG 12-Lead  . EKG 12-Lead      Management plans discussed with the patient, family and they are in agreement.  CODE STATUS: DNR    Code Status Orders  (From admission, onward)         Start     Ordered   10/20/18 2119  Do not attempt  resuscitation (DNR)  Continuous    Question Answer Comment  In the event of cardiac or respiratory ARREST Do not call a "code blue"   In the event of cardiac or respiratory ARREST Do not perform Intubation, CPR, defibrillation or ACLS   In the event of cardiac or respiratory ARREST Use medication by any route, position, wound care, and other measures to relive pain and suffering. May use oxygen, suction and manual treatment of airway obstruction as needed for comfort.      10/20/18 2118        Code Status History    Date Active Date Inactive Code Status Order ID Comments User Context   07/16/2018 1139 07/20/2018 1713 DNR [id number]  Saundra Shelling, MD ED   07/16/2018 0451 07/16/2018 1139 Full Code [id number]  Gladstone Lighter, MD ED   12/04/2017 1517 12/06/2017 1916 Partial Code [id number]  Salary, Avel Peace, MD Inpatient    Advance Directive Documentation     Most Recent Value  Type of Advance Directive  Out of facility DNR (pink MOST or yellow form)  Pre-existing out of facility DNR order (yellow form or pink MOST form)  Physician notified to receive inpatient order  "MOST" Form in Place?  -      TOTAL TIME TAKING CARE OF THIS PATIENT: 35 minutes.    Vaughan Basta M.D on 11/01/2018 at 1:47 PM  Between 7am to 6pm - Pager - [phone]  After 6pm go to [email or web address] - password [email or web address] [facility]  Office  [phone]  CC: Primary care physician; Jerrol Banana., MD   Note: This dictation was prepared with Dragon dictation along with smaller phrase technology. Any transcriptional errors that result from this process are unintentional.

## 2018-11-01 NOTE — TOC Transition Note (Signed)
Transition of Care [facility]) - CM/SW Discharge Note   Patient Details  Name: Joseph Barton MRN: [id number] Date of Birth: 1927/05/02  [facility] CM/SW Contact:  Annamaria Boots, [place] Phone Number: 11/01/2018, 1:58 PM   Clinical Narrative: Patient is medically ready for discharge today. Family has discussed options and has decided to go to [facility]. CSW notified patient's wife [name] of discharge today. Wife is in agreement with discharge to [facility] in [place] today. CSW also notified Rick at [place] [facility] of discharge today. Patient will be transported by EMS. RN to call report and call for transport.     Final next level of care: Skilled Nursing Facility Barriers to Discharge: No Barriers Identified   Patient Goals and CMS Choice Patient states their goals for this hospitalization and ongoing recovery are:: To return back home with home health CMS Medicare.gov Compare Post Acute Care list provided to:: Patient Represenative (must comment) Choice offered to / list presented to : Spouse  Discharge Placement PASRR number recieved: 10/24/18            Patient chooses bed at: [facility] Patient to be transferred to facility by: EMS Name of family member notified: Wife- Oletta Lamas Patient and family notified of of transfer: 11/01/18  Discharge Plan and Services   Discharge Planning Services: CM Consult                                 Social Determinants of Health (SDOH) Interventions     Readmission Risk Interventions Readmission Risk Prevention Plan 10/24/2018  Transportation Screening Complete  PCP or Specialist Appt within 3-5 Days Complete  HRI or Home Care Consult Complete  Social Work Consult for [place] Planning/Counseling Not Complete  SW consult not completed comments Patient feels that he does well with the support of his wife and daughter, he is not interested in a home health Education officer, museum.  Palliative Care  Screening Not Applicable  Medication Review (RN Care Manager) Complete  Some recent data might be hidden

## 2018-11-01 NOTE — Consult Note (Addendum)
[place] for Warfarin Indication: atrial fibrillation  Patient Measurements: Height: 6\' 2"  (188 cm) Weight: 176 lb 6.4 oz (80 kg) IBW/kg (Calculated) : 82.2  Vital Signs: Temp: 98.4 F (36.9 C) (05/01 0829) Temp Source: Oral (05/01 0400) BP: 99/60 (05/01 0829) Pulse Rate: 66 (05/01 0829)  Labs: Recent Labs    10/30/18 0534 10/31/18 0337 11/01/18 0303  HGB  --   --  13.6  HCT  --   --  41.4  PLT  --   --  170  LABPROT 24.8* 27.7* 26.9*  INR 2.3* 2.6* 2.5*    Estimated Creatinine Clearance: 51 mL/min (by C-G formula based on SCr of 1.09 mg/dL).   Assessment: Pharmacy was consulted to start dosing warfarin. INR increased from 2.6 to 3.3 on 4/23. Patient's home dose is 1.25 mg M-W-F and 2.5 mg T-Th-Sat-Sun. DDI: APAP- inc risk of bleeding, digoxin, amio increase INR, Bactrim started 4/24. Amiodarone was d/c'ed due to toxicity.   Date INR Warfarin Dose  4/22 2.6 1.25 mg  4/23 3.3 Hold  4/24 4.1 Hold  4/25 3.2 Hold  4/26 2.8 1 mg  4/27 2.3 1.5 mg  4/28 2.2 1.5 mg  4/29 2.3 1.5mg   4/30 2.6 1 mg (dose not given per [facility])  5/01 2.5     Goal of Therapy:  INR 2-3 Monitor platelets by anticoagulation protocol: Yes   Plan:  --INR 2.5 today within goal range, appears that 1 mg dose ordered for 4/30 was not given --Will continue with warfarin 1 mg for tonight   --Daily INR ordered. CBC has been stable. Monitor for any sign or symptoms of bleeding.  --CBC in am  Pharmacy will continue to follow.   Paulina Fusi, PharmD, BCPS 11/01/2018 11:45 AM

## 2018-11-01 NOTE — Progress Notes (Signed)
DR Anselm Jungling was made aware of patient having a slow ventricular respond with a 2.58 sec pause. . Continue to monitor patient and to informed cardiologist .

## 2018-11-01 NOTE — Progress Notes (Signed)
Pulmonary Medicine          Date: 11/01/2018,   MRN# [id number] Joseph Barton 1927-03-30     AdmissionWeight: 83 kg                 CurrentWeight: 80 kg        SUBJECTIVE   Patient clinically improved, now between RA and 2L/min [place] - from initial 10L/min [place]     Reviewed CXR from yesterday - essentially unchanged from previous, would recommend not repeating for 8 weeks unless clinically worse.     PAST MEDICAL HISTORY   Past Medical History:  Diagnosis Date   Abdominal aortic aneurysm ([place]) 01-2004   4.7 x 4.7 cm.   CAD (coronary artery disease) 01/25/2004   a. 01/2004 Ant MI/DES to LAD;  b. 10/2011 Neg MV.   Chronic systolic heart failure ([place])    a. 08/2016 Echo: EF 35%, nl RV fxn, mod to sev MR, mild to mod TR, mod biatrial enlargement.   Hyperlipidemia    Hypertension    Ischemic cardiomyopathy    a. 08/2016 Echo: EF 35%.   Moderate to Severe Mitral regurgitation    a. 08/2016 Echo: Mod-sev MR.   Permanent atrial fibrillation    a. Chronic coumadin (CHA2DS2VASc = 5).   Pulmonary nodules    Noted on abdominal CT   Statin intolerance      SURGICAL HISTORY   Past Surgical History:  Procedure Laterality Date   ABDOMINAL AORTIC ANEURYSM REPAIR     12/02/2007 [facility]   ABDOMINAL AORTIC ANEURYSM REPAIR  2006   [facility]    CARDIAC CATHETERIZATION  2009   [facility]   CORONARY ANGIOPLASTY WITH STENT PLACEMENT  2005   Cypher stent LAD    Cypher stents to LAD     01/25/2004   SKIN SURGERY  2016   [facility] skin cancer     FAMILY HISTORY   Family History  Problem Relation Age of Onset   Heart attack Father 27       Died w/ Heart attack   Diabetes Father    Heart attack Brother    Heart disease Brother    Diabetes Sister    Heart disease Sister      SOCIAL HISTORY   Social History   Tobacco Use   Smoking status: Former Smoker    Last attempt to quit: 12/01/1987    Years since quitting: 30.9   Smokeless tobacco: Never Used   Substance Use Topics   Alcohol use: No   Drug use: No     MEDICATIONS    Home Medication:    Current Medication:  Current Facility-Administered Medications:    0.9 %  sodium chloride infusion, 250 mL, Intravenous, PRN, Pyreddy, Reatha Harps, MD, Stopped at 10/22/18 1845   0.9 %  sodium chloride infusion, , Intravenous, Continuous, Ojie, Jude, MD   acetaminophen (TYLENOL) tablet 650 mg, 650 mg, Oral, Q6H PRN, Ojie, Jude, MD, 650 mg at 10/26/18 1114   albuterol (PROVENTIL) (2.5 MG/3ML) 0.083% nebulizer solution 2.5 mg, 2.5 mg, Inhalation, Q2H PRN, Vaughan Basta, MD   benzonatate (TESSALON) capsule 100 mg, 100 mg, Oral, TID PRN, Stark Jock, Jude, MD, 100 mg at 10/30/18 1138   benzonatate (TESSALON) capsule 100 mg, 100 mg, Oral, BID, Vaughan Basta, MD, 100 mg at 10/31/18 2147   bisacodyl (DULCOLAX) EC tablet 5 mg, 5 mg, Oral, Daily PRN, Seals, Angela H, NP, 5 mg at 10/30/18 0920   digoxin (LANOXIN) tablet  0.0625 mg, 0.0625 mg, Oral, Daily, Pyreddy, Pavan, MD, 0.0625 mg at 10/30/18 0920   docusate sodium (COLACE) capsule 200 mg, 200 mg, Oral, BID, Harrie Foreman, MD, 200 mg at 10/31/18 0957   fluticasone (FLONASE) 50 MCG/ACT nasal spray 1 spray, 1 spray, Each Nare, Daily, Seals, Theo Dills, NP, 1 spray at 10/31/18 1000   furosemide (LASIX) tablet 40 mg, 40 mg, Oral, Daily, Vaughan Basta, MD, 40 mg at 10/31/18 0959   guaiFENesin-dextromethorphan (ROBITUSSIN DM) 100-10 MG/5ML syrup 5 mL, 5 mL, Oral, Q4H PRN, Ojie, Jude, MD, 5 mL at 10/30/18 0931   ipratropium-albuterol (DUONEB) 0.5-2.5 (3) MG/3ML nebulizer solution 3 mL, 3 mL, Nebulization, TID, Ojie, Jude, MD, 3 mL at 11/01/18 0807   metoprolol succinate (TOPROL-XL) 24 hr tablet 25 mg, 25 mg, Oral, Daily, Fay Records, MD, 25 mg at 10/30/18 0920   multivitamin with minerals tablet 1 tablet, 1 tablet, Oral, Daily, Ojie, Jude, MD, 1 tablet at 10/31/18 0957   nitroGLYCERIN (NITROSTAT) SL tablet 0.4 mg, 0.4 mg,  Sublingual, Q5 min PRN, Ojie, Jude, MD   oxymetazoline (AFRIN) 0.05 % nasal spray 3 spray, 3 spray, Each Nare, BID, Vaughan Basta, MD, 3 spray at 10/31/18 2146   pantoprazole (PROTONIX) EC tablet 40 mg, 40 mg, Oral, Daily, Pyreddy, Pavan, MD, 40 mg at 10/31/18 0957   polyethylene glycol (MIRALAX / GLYCOLAX) packet 17 g, 17 g, Oral, Daily, Harrie Foreman, MD, 17 g at 10/31/18 1015   predniSONE (DELTASONE) tablet 40 mg, 40 mg, Oral, Q breakfast, Ottie Glazier, MD, 40 mg at 10/31/18 8295   sodium chloride flush (NS) 0.9 % injection 3 mL, 3 mL, Intravenous, Q12H, Pyreddy, Pavan, MD, 3 mL at 10/31/18 2147   sodium chloride flush (NS) 0.9 % injection 3 mL, 3 mL, Intravenous, PRN, Pyreddy, Reatha Harps, MD, 3 mL at 10/24/18 0641   sulfamethoxazole-trimethoprim (BACTRIM) 400-80 MG per tablet 1 tablet, 1 tablet, Oral, Daily, Ojie, Jude, MD, 1 tablet at 10/31/18 0959   tamsulosin (FLOMAX) capsule 0.4 mg, 0.4 mg, Oral, Daily, Pyreddy, Pavan, MD, 0.4 mg at 10/31/18 0957   traZODone (DESYREL) tablet 50 mg, 50 mg, Oral, QHS PRN, Mayo, Pete Pelt, MD, 50 mg at 10/30/18 2104   warfarin (COUMADIN) tablet 1 mg, 1 mg, Oral, ONCE-1800, Dallie Piles, RPH   Warfarin - Pharmacist Dosing Inpatient, , Does not apply, q1800, Ojie, Jude, MD    ALLERGIES   Atorvastatin and Simvastatin     REVIEW OF SYSTEMS    Review of Systems:  Gen:  Denies  fever, sweats, chills weigh loss  HEENT: Denies blurred vision, double vision, ear pain, eye pain, hearing loss, nose bleeds, sore throat Cardiac:  No dizziness, chest pain or heaviness, chest tightness,edema Resp:   Denies cough or sputum porduction, shortness of breath,wheezing, hemoptysis,  Gi: Denies swallowing difficulty, stomach pain, nausea or vomiting, diarrhea, constipation, bowel incontinence Gu:  Denies bladder incontinence, burning urine Ext:   Denies Joint pain, stiffness or swelling Skin: Denies  skin rash, easy bruising or bleeding or  hives Endoc:  Denies polyuria, polydipsia , polyphagia or weight change Psych:   Denies depression, insomnia or hallucinations   Other:  All other systems negative   VS: BP 99/60 (BP Location: Left Arm)    Pulse 66    Temp 98.4 F (36.9 C)    Resp 18    Ht 6\' 2"  (1.88 m)    Wt 80 kg    SpO2 96%    BMI 22.65 kg/m  PHYSICAL EXAM    GENERAL:NAD, no fevers, chills, no weakness no fatigue HEAD: Normocephalic, atraumatic.  EYES: Pupils equal, round, reactive to light. Extraocular muscles intact. No scleral icterus.  MOUTH: Moist mucosal membrane. Dentition intact. No abscess noted.  EAR, NOSE, THROAT: Clear without exudates. No external lesions.  NECK: Supple. No thyromegaly. No nodules. No JVD.  PULMONARY: Bilateral coarse velcro crackles CARDIOVASCULAR: S1 and S2. Regular rate and rhythm. No murmurs, rubs, or gallops. No edema. Pedal pulses 2+ bilaterally.  GASTROINTESTINAL: Soft, nontender, nondistended. No masses. Positive bowel sounds. No hepatosplenomegaly.  MUSCULOSKELETAL: No swelling, clubbing, or edema. Range of motion full in all extremities.  NEUROLOGIC: Cranial nerves II through XII are intact. No gross focal neurological deficits. Sensation intact. Reflexes intact.  SKIN: No ulceration, lesions, rashes, or cyanosis. Skin warm and dry. Turgor intact.  PSYCHIATRIC: Mood, affect within normal limits. The patient is awake, alert and oriented x 3. Insight, judgment intact.       IMAGING    Ct Chest Wo Contrast  Result Date: 10/23/2018 CLINICAL DATA:  Cough, shortness of breath. EXAM: CT CHEST WITHOUT CONTRAST TECHNIQUE: Multidetector CT imaging of the chest was performed following the standard protocol without IV contrast. COMPARISON:  Radiograph of October 22, 2018. CT scan of July 15, 2018. FINDINGS: Cardiovascular: Atherosclerosis of thoracic aorta is noted without aneurysm formation. Normal cardiac size. Coronary artery calcifications are noted. No pericardial  effusion. Mediastinum/Nodes: Thyroid gland and esophagus are unremarkable. Mildly enlarged mediastinal adenopathy is noted which most likely is reactive or inflammatory in etiology. Lungs/Pleura: No pneumothorax or pleural effusion is noted. Emphysematous disease is noted in both lungs. Bronchiectasis is noted in both lower lobes. There is interval development of diffuse reticular and interstitial densities throughout both lungs concerning for interstitial or atypical pneumonia or possibly edema. Upper Abdomen: No acute abnormality. Musculoskeletal: No chest wall mass or suspicious bone lesions identified. IMPRESSION: Interval development of diffuse and interstitial densities throughout both lungs concerning for interstitial or atypical pneumonia or possibly edema. Bronchiectasis is noted in both lower lobes. Mildly enlarged mediastinal adenopathy is noted which most likely is reactive or inflammatory in etiology. Coronary artery calcifications are noted suggesting coronary artery disease. Aortic Atherosclerosis (ICD10-I70.0) and Emphysema (ICD10-J43.9). Electronically Signed   By: Marijo Conception M.D.   On: 10/23/2018 10:15   Dg Chest Port 1 View  Result Date: 10/31/2018 CLINICAL DATA:  Pneumonia. EXAM: PORTABLE CHEST 1 VIEW COMPARISON:  10/29/2018 FINDINGS: Stable mild cardiac enlargement. Severe bilateral pneumonia again noted superimposed on chronic lung disease. Density of peripheral consolidation in the right upper lobe appears slightly improved. Other areas of airspace disease bilaterally appear stable. Lung volumes are stable. No evidence of pleural fluid or pneumothorax. IMPRESSION: Relatively stable severe bilateral pneumonia with slightly improved density of consolidation in the peripheral right upper lobe. Electronically Signed   By: Aletta Edouard M.D.   On: 10/31/2018 08:21   Dg Chest Port 1 View  Result Date: 10/29/2018 CLINICAL DATA:  Shortness of breath. EXAM: PORTABLE CHEST 1 VIEW  COMPARISON:  Radiograph of October 26, 2018. FINDINGS: Stable cardiomediastinal silhouette. Atherosclerosis of thoracic aorta is noted. No pneumothorax or pleural effusion is noted. Stable bilateral diffuse airspace and interstitial densities are noted throughout both lungs concerning for atypical infection or edema. Bony thorax unremarkable. IMPRESSION: Stable bilateral lung opacities as described above. Aortic Atherosclerosis (ICD10-I70.0). Electronically Signed   By: Marijo Conception M.D.   On: 10/29/2018 07:56   Dg Chest [facility] 1 View  Result  Date: 10/26/2018 CLINICAL DATA:  Increased shortness of breath. EXAM: PORTABLE CHEST 1 VIEW COMPARISON:  10/22/2018. FINDINGS: Cardiomegaly. Diffuse BILATERAL interstitial and alveolar lung disease not significantly changed from priors. No pneumothorax or effusion. Thoracic atherosclerosis. Bones unremarkable. IMPRESSION: Stable chest. Electronically Signed   By: Staci Righter M.D.   On: 10/26/2018 07:01   Dg Chest Port 1 View  Result Date: 10/22/2018 CLINICAL DATA:  Shortness of breath. EXAM: PORTABLE CHEST 1 VIEW COMPARISON:  Chest x-ray dated October 20, 2018. FINDINGS: Stable cardiomediastinal silhouette. Bilateral diffuse interstitial and alveolar airspace opacities with peripheral predominance again noted, slightly worsened in both upper lobes. No pleural effusion or pneumothorax. No acute osseous abnormality. IMPRESSION: 1. Peripheral predominant bilateral interstitial and alveolar airspace disease, worsened in both upper lobes. Electronically Signed   By: Titus Dubin M.D.   On: 10/22/2018 19:41   Dg Chest Portable 1 View  Result Date: 10/20/2018 CLINICAL DATA:  Shortness of breath, hypoxia EXAM: PORTABLE CHEST 1 VIEW COMPARISON:  09/24/2018 FINDINGS: Bilateral diffuse interstitial and alveolar airspace opacities with a peripheral predominance. No pleural effusion or pneumothorax. Stable cardiomediastinal silhouette. No aggressive osseous lesion. IMPRESSION:  1. Chronic interstitial lung disease. 2. Increased bilateral interstitial and alveolar airspace opacities with a peripheral predominance. Differential considerations include multilobar pneumonia including atypical infection such as viral pneumonia. 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           -Significantly improved now b/w RA and 2L/min nasal cannula - will order exertional walk to delineate O2 requirement and hopefully send home with oxygen today. -ID and Cardiology on case - appreciate input -continue prednisone 40 mg p.o. daily plan for 2 months slow taper- on bactrim for PCP pp - pulmonary clinic follow up will set up post d/c -Atelectasis - please provide patient with Acapella flutter valve and IS to take home with him. Chest PT today.  -please encourage patient to use every hour several times.  -please continue to work with physical therapy        -pt states he would appreciate if he can have trazodone at home to help him sleep as well as tessalon for intermittent cough    Thank you for allowing me to participate in the care of this patient.     Patient/Family are satisfied with care plan and all questions have been answered.  This document was prepared using Dragon voice recognition software and may include unintentional dictation errors.     Ottie Glazier, M.D.  Division of [place]

## 2018-11-05 DIAGNOSIS — J9600 Acute respiratory failure, unspecified whether with hypoxia or hypercapnia: Secondary | ICD-10-CM | POA: Diagnosis not present

## 2018-11-05 DIAGNOSIS — T462X1A Poisoning by other antidysrhythmic drugs, accidental (unintentional), initial encounter: Secondary | ICD-10-CM | POA: Diagnosis not present

## 2018-11-05 DIAGNOSIS — I509 Heart failure, unspecified: Secondary | ICD-10-CM | POA: Diagnosis not present

## 2018-11-05 DIAGNOSIS — I4891 Unspecified atrial fibrillation: Secondary | ICD-10-CM | POA: Diagnosis not present

## 2018-11-14 DIAGNOSIS — R0902 Hypoxemia: Secondary | ICD-10-CM | POA: Diagnosis not present

## 2018-11-14 DIAGNOSIS — R279 Unspecified lack of coordination: Secondary | ICD-10-CM | POA: Diagnosis not present

## 2018-11-14 DIAGNOSIS — I5023 Acute on chronic systolic (congestive) heart failure: Secondary | ICD-10-CM | POA: Diagnosis not present

## 2018-11-14 DIAGNOSIS — R001 Bradycardia, unspecified: Secondary | ICD-10-CM | POA: Diagnosis not present

## 2018-11-14 DIAGNOSIS — Z743 Need for continuous supervision: Secondary | ICD-10-CM | POA: Diagnosis not present

## 2018-11-15 ENCOUNTER — Telehealth: Payer: Self-pay

## 2018-11-15 DIAGNOSIS — I110 Hypertensive heart disease with heart failure: Secondary | ICD-10-CM | POA: Diagnosis not present

## 2018-11-15 DIAGNOSIS — I0981 Rheumatic heart failure: Secondary | ICD-10-CM | POA: Diagnosis not present

## 2018-11-15 DIAGNOSIS — E785 Hyperlipidemia, unspecified: Secondary | ICD-10-CM | POA: Diagnosis not present

## 2018-11-15 DIAGNOSIS — J984 Other disorders of lung: Secondary | ICD-10-CM | POA: Diagnosis not present

## 2018-11-15 DIAGNOSIS — I5022 Chronic systolic (congestive) heart failure: Secondary | ICD-10-CM | POA: Diagnosis not present

## 2018-11-15 DIAGNOSIS — I4821 Permanent atrial fibrillation: Secondary | ICD-10-CM | POA: Diagnosis not present

## 2018-11-15 DIAGNOSIS — I714 Abdominal aortic aneurysm, without rupture: Secondary | ICD-10-CM | POA: Diagnosis not present

## 2018-11-15 DIAGNOSIS — Z7901 Long term (current) use of anticoagulants: Secondary | ICD-10-CM | POA: Diagnosis not present

## 2018-11-15 DIAGNOSIS — I255 Ischemic cardiomyopathy: Secondary | ICD-10-CM | POA: Diagnosis not present

## 2018-11-15 DIAGNOSIS — Z5181 Encounter for therapeutic drug level monitoring: Secondary | ICD-10-CM | POA: Diagnosis not present

## 2018-11-15 DIAGNOSIS — I2510 Atherosclerotic heart disease of native coronary artery without angina pectoris: Secondary | ICD-10-CM | POA: Diagnosis not present

## 2018-11-15 DIAGNOSIS — T462X5D Adverse effect of other antidysrhythmic drugs, subsequent encounter: Secondary | ICD-10-CM | POA: Diagnosis not present

## 2018-11-15 DIAGNOSIS — I051 Rheumatic mitral insufficiency: Secondary | ICD-10-CM | POA: Diagnosis not present

## 2018-11-15 DIAGNOSIS — Z9981 Dependence on supplemental oxygen: Secondary | ICD-10-CM | POA: Diagnosis not present

## 2018-11-15 DIAGNOSIS — Z7952 Long term (current) use of systemic steroids: Secondary | ICD-10-CM | POA: Diagnosis not present

## 2018-11-15 DIAGNOSIS — Z9181 History of falling: Secondary | ICD-10-CM | POA: Diagnosis not present

## 2018-11-15 NOTE — Telephone Encounter (Signed)
Joseph Barton w/ [facility] was advised per Dr. Caryn Section to move forward with verbal order and check patient PT/INR. Joseph Barton states she will call back with PT/INR results for Dr. Caryn Section since Dr. Rosanna Randy will be out of office for the next 2 weeks. FYI

## 2018-11-15 NOTE — Telephone Encounter (Signed)
Please review for Dr. Wallis Bamberg  with [facility] needs a verbal order for nursing care.    Two weeks for one week One time a week for five weeks.  She would like a verbal okay to increase his O2 to 3 Liters secondary to long tubing.  She is also wanting to know if you would like her to check PT/INR?     Contact [phone]  Thanks,   -Mickel Baas

## 2018-11-15 NOTE — Telephone Encounter (Signed)
That's fine

## 2018-11-18 DIAGNOSIS — I714 Abdominal aortic aneurysm, without rupture: Secondary | ICD-10-CM | POA: Diagnosis not present

## 2018-11-18 DIAGNOSIS — J984 Other disorders of lung: Secondary | ICD-10-CM | POA: Diagnosis not present

## 2018-11-18 DIAGNOSIS — I110 Hypertensive heart disease with heart failure: Secondary | ICD-10-CM | POA: Diagnosis not present

## 2018-11-18 DIAGNOSIS — E785 Hyperlipidemia, unspecified: Secondary | ICD-10-CM | POA: Diagnosis not present

## 2018-11-18 DIAGNOSIS — Z9981 Dependence on supplemental oxygen: Secondary | ICD-10-CM | POA: Diagnosis not present

## 2018-11-18 DIAGNOSIS — I0981 Rheumatic heart failure: Secondary | ICD-10-CM | POA: Diagnosis not present

## 2018-11-18 DIAGNOSIS — Z5181 Encounter for therapeutic drug level monitoring: Secondary | ICD-10-CM | POA: Diagnosis not present

## 2018-11-18 DIAGNOSIS — I255 Ischemic cardiomyopathy: Secondary | ICD-10-CM | POA: Diagnosis not present

## 2018-11-18 DIAGNOSIS — I4821 Permanent atrial fibrillation: Secondary | ICD-10-CM | POA: Diagnosis not present

## 2018-11-18 DIAGNOSIS — I2510 Atherosclerotic heart disease of native coronary artery without angina pectoris: Secondary | ICD-10-CM | POA: Diagnosis not present

## 2018-11-18 DIAGNOSIS — Z7901 Long term (current) use of anticoagulants: Secondary | ICD-10-CM | POA: Diagnosis not present

## 2018-11-18 DIAGNOSIS — T462X5D Adverse effect of other antidysrhythmic drugs, subsequent encounter: Secondary | ICD-10-CM | POA: Diagnosis not present

## 2018-11-18 DIAGNOSIS — Z7952 Long term (current) use of systemic steroids: Secondary | ICD-10-CM | POA: Diagnosis not present

## 2018-11-18 DIAGNOSIS — I051 Rheumatic mitral insufficiency: Secondary | ICD-10-CM | POA: Diagnosis not present

## 2018-11-18 DIAGNOSIS — I5022 Chronic systolic (congestive) heart failure: Secondary | ICD-10-CM | POA: Diagnosis not present

## 2018-11-18 DIAGNOSIS — Z9181 History of falling: Secondary | ICD-10-CM | POA: Diagnosis not present

## 2018-11-19 ENCOUNTER — Telehealth: Payer: Self-pay

## 2018-11-19 DIAGNOSIS — I0981 Rheumatic heart failure: Secondary | ICD-10-CM | POA: Diagnosis not present

## 2018-11-19 DIAGNOSIS — E785 Hyperlipidemia, unspecified: Secondary | ICD-10-CM | POA: Diagnosis not present

## 2018-11-19 DIAGNOSIS — Z9181 History of falling: Secondary | ICD-10-CM | POA: Diagnosis not present

## 2018-11-19 DIAGNOSIS — I051 Rheumatic mitral insufficiency: Secondary | ICD-10-CM | POA: Diagnosis not present

## 2018-11-19 DIAGNOSIS — J984 Other disorders of lung: Secondary | ICD-10-CM | POA: Diagnosis not present

## 2018-11-19 DIAGNOSIS — I4821 Permanent atrial fibrillation: Secondary | ICD-10-CM | POA: Diagnosis not present

## 2018-11-19 DIAGNOSIS — I714 Abdominal aortic aneurysm, without rupture: Secondary | ICD-10-CM | POA: Diagnosis not present

## 2018-11-19 DIAGNOSIS — I110 Hypertensive heart disease with heart failure: Secondary | ICD-10-CM | POA: Diagnosis not present

## 2018-11-19 DIAGNOSIS — Z9981 Dependence on supplemental oxygen: Secondary | ICD-10-CM | POA: Diagnosis not present

## 2018-11-19 DIAGNOSIS — I5022 Chronic systolic (congestive) heart failure: Secondary | ICD-10-CM | POA: Diagnosis not present

## 2018-11-19 DIAGNOSIS — Z7901 Long term (current) use of anticoagulants: Secondary | ICD-10-CM | POA: Diagnosis not present

## 2018-11-19 DIAGNOSIS — Z7952 Long term (current) use of systemic steroids: Secondary | ICD-10-CM | POA: Diagnosis not present

## 2018-11-19 DIAGNOSIS — Z5181 Encounter for therapeutic drug level monitoring: Secondary | ICD-10-CM | POA: Diagnosis not present

## 2018-11-19 DIAGNOSIS — I2510 Atherosclerotic heart disease of native coronary artery without angina pectoris: Secondary | ICD-10-CM | POA: Diagnosis not present

## 2018-11-19 DIAGNOSIS — I255 Ischemic cardiomyopathy: Secondary | ICD-10-CM | POA: Diagnosis not present

## 2018-11-19 DIAGNOSIS — T462X5D Adverse effect of other antidysrhythmic drugs, subsequent encounter: Secondary | ICD-10-CM | POA: Diagnosis not present

## 2018-11-19 NOTE — Telephone Encounter (Signed)
Joseph Barton @[facility] [phone]. Needs a verbal order for OT for twice a week for 2 weeks

## 2018-11-19 NOTE — Telephone Encounter (Signed)
That's fine

## 2018-11-19 NOTE — Telephone Encounter (Signed)
Pt advised.   Thanks,   -Laura  

## 2018-11-20 ENCOUNTER — Other Ambulatory Visit: Payer: Self-pay

## 2018-11-20 DIAGNOSIS — I110 Hypertensive heart disease with heart failure: Secondary | ICD-10-CM | POA: Diagnosis not present

## 2018-11-20 DIAGNOSIS — E785 Hyperlipidemia, unspecified: Secondary | ICD-10-CM | POA: Diagnosis not present

## 2018-11-20 DIAGNOSIS — Z5181 Encounter for therapeutic drug level monitoring: Secondary | ICD-10-CM | POA: Diagnosis not present

## 2018-11-20 DIAGNOSIS — Z9981 Dependence on supplemental oxygen: Secondary | ICD-10-CM | POA: Diagnosis not present

## 2018-11-20 DIAGNOSIS — J984 Other disorders of lung: Secondary | ICD-10-CM | POA: Diagnosis not present

## 2018-11-20 DIAGNOSIS — I2510 Atherosclerotic heart disease of native coronary artery without angina pectoris: Secondary | ICD-10-CM | POA: Diagnosis not present

## 2018-11-20 DIAGNOSIS — Z7952 Long term (current) use of systemic steroids: Secondary | ICD-10-CM | POA: Diagnosis not present

## 2018-11-20 DIAGNOSIS — I255 Ischemic cardiomyopathy: Secondary | ICD-10-CM | POA: Diagnosis not present

## 2018-11-20 DIAGNOSIS — I4821 Permanent atrial fibrillation: Secondary | ICD-10-CM | POA: Diagnosis not present

## 2018-11-20 DIAGNOSIS — I5022 Chronic systolic (congestive) heart failure: Secondary | ICD-10-CM | POA: Diagnosis not present

## 2018-11-20 DIAGNOSIS — I714 Abdominal aortic aneurysm, without rupture: Secondary | ICD-10-CM | POA: Diagnosis not present

## 2018-11-20 DIAGNOSIS — Z7901 Long term (current) use of anticoagulants: Secondary | ICD-10-CM | POA: Diagnosis not present

## 2018-11-20 DIAGNOSIS — I0981 Rheumatic heart failure: Secondary | ICD-10-CM | POA: Diagnosis not present

## 2018-11-20 DIAGNOSIS — I051 Rheumatic mitral insufficiency: Secondary | ICD-10-CM | POA: Diagnosis not present

## 2018-11-20 DIAGNOSIS — Z9181 History of falling: Secondary | ICD-10-CM | POA: Diagnosis not present

## 2018-11-20 DIAGNOSIS — T462X5D Adverse effect of other antidysrhythmic drugs, subsequent encounter: Secondary | ICD-10-CM | POA: Diagnosis not present

## 2018-11-21 ENCOUNTER — Ambulatory Visit: Payer: Self-pay | Admitting: Family Medicine

## 2018-11-21 ENCOUNTER — Telehealth: Payer: Self-pay

## 2018-11-21 DIAGNOSIS — I051 Rheumatic mitral insufficiency: Secondary | ICD-10-CM | POA: Diagnosis not present

## 2018-11-21 DIAGNOSIS — I255 Ischemic cardiomyopathy: Secondary | ICD-10-CM | POA: Diagnosis not present

## 2018-11-21 DIAGNOSIS — E785 Hyperlipidemia, unspecified: Secondary | ICD-10-CM | POA: Diagnosis not present

## 2018-11-21 DIAGNOSIS — T462X5D Adverse effect of other antidysrhythmic drugs, subsequent encounter: Secondary | ICD-10-CM | POA: Diagnosis not present

## 2018-11-21 DIAGNOSIS — Z5181 Encounter for therapeutic drug level monitoring: Secondary | ICD-10-CM | POA: Diagnosis not present

## 2018-11-21 DIAGNOSIS — I110 Hypertensive heart disease with heart failure: Secondary | ICD-10-CM | POA: Diagnosis not present

## 2018-11-21 DIAGNOSIS — I4821 Permanent atrial fibrillation: Secondary | ICD-10-CM | POA: Diagnosis not present

## 2018-11-21 DIAGNOSIS — I0981 Rheumatic heart failure: Secondary | ICD-10-CM | POA: Diagnosis not present

## 2018-11-21 DIAGNOSIS — I5022 Chronic systolic (congestive) heart failure: Secondary | ICD-10-CM | POA: Diagnosis not present

## 2018-11-21 DIAGNOSIS — I714 Abdominal aortic aneurysm, without rupture: Secondary | ICD-10-CM | POA: Diagnosis not present

## 2018-11-21 DIAGNOSIS — Z9181 History of falling: Secondary | ICD-10-CM | POA: Diagnosis not present

## 2018-11-21 DIAGNOSIS — Z9981 Dependence on supplemental oxygen: Secondary | ICD-10-CM | POA: Diagnosis not present

## 2018-11-21 DIAGNOSIS — I2510 Atherosclerotic heart disease of native coronary artery without angina pectoris: Secondary | ICD-10-CM | POA: Diagnosis not present

## 2018-11-21 DIAGNOSIS — Z7901 Long term (current) use of anticoagulants: Secondary | ICD-10-CM | POA: Diagnosis not present

## 2018-11-21 DIAGNOSIS — Z7952 Long term (current) use of systemic steroids: Secondary | ICD-10-CM | POA: Diagnosis not present

## 2018-11-21 DIAGNOSIS — J984 Other disorders of lung: Secondary | ICD-10-CM | POA: Diagnosis not present

## 2018-11-21 NOTE — Telephone Encounter (Signed)
Please advise 

## 2018-11-21 NOTE — Telephone Encounter (Signed)
Nurse with [facility] called and stated patient INR: 1/ PT: 12.2. She stated that patient family advised her patient did not take his medication on 11/20/2018 or today 11/21/2018. I spoke with Simona Huh about patients results and he advise for patient to started taking 1.25mg  daily and recheck in 1 week. Patient has appointment schedule for 11/26/2018 @ 11:10 AM. Patient family stated that they may not be able to get patient in or not of the car for the appointment but they will try and I asked if they just want to cancel the appointment and I let Simona Huh know and family stated no. If not the nurse with [facility] will recheck patient PT/ INR at home.FYI

## 2018-11-21 NOTE — Telephone Encounter (Signed)
This is Dr. [name] patient. [facility] with [facility] called requesting verbal orders for Physical therapy 2 times a week for 2 weeks, starting next week. Call back: [phone]. Please advise.

## 2018-11-22 DIAGNOSIS — I714 Abdominal aortic aneurysm, without rupture: Secondary | ICD-10-CM | POA: Diagnosis not present

## 2018-11-22 DIAGNOSIS — I255 Ischemic cardiomyopathy: Secondary | ICD-10-CM | POA: Diagnosis not present

## 2018-11-22 DIAGNOSIS — E785 Hyperlipidemia, unspecified: Secondary | ICD-10-CM | POA: Diagnosis not present

## 2018-11-22 DIAGNOSIS — I051 Rheumatic mitral insufficiency: Secondary | ICD-10-CM | POA: Diagnosis not present

## 2018-11-22 DIAGNOSIS — I2510 Atherosclerotic heart disease of native coronary artery without angina pectoris: Secondary | ICD-10-CM | POA: Diagnosis not present

## 2018-11-22 DIAGNOSIS — Z9181 History of falling: Secondary | ICD-10-CM | POA: Diagnosis not present

## 2018-11-22 DIAGNOSIS — I4821 Permanent atrial fibrillation: Secondary | ICD-10-CM | POA: Diagnosis not present

## 2018-11-22 DIAGNOSIS — J984 Other disorders of lung: Secondary | ICD-10-CM | POA: Diagnosis not present

## 2018-11-22 DIAGNOSIS — T462X5D Adverse effect of other antidysrhythmic drugs, subsequent encounter: Secondary | ICD-10-CM | POA: Diagnosis not present

## 2018-11-22 DIAGNOSIS — Z7901 Long term (current) use of anticoagulants: Secondary | ICD-10-CM | POA: Diagnosis not present

## 2018-11-22 DIAGNOSIS — I5022 Chronic systolic (congestive) heart failure: Secondary | ICD-10-CM | POA: Diagnosis not present

## 2018-11-22 DIAGNOSIS — Z9981 Dependence on supplemental oxygen: Secondary | ICD-10-CM | POA: Diagnosis not present

## 2018-11-22 DIAGNOSIS — I0981 Rheumatic heart failure: Secondary | ICD-10-CM | POA: Diagnosis not present

## 2018-11-22 DIAGNOSIS — Z5181 Encounter for therapeutic drug level monitoring: Secondary | ICD-10-CM | POA: Diagnosis not present

## 2018-11-22 DIAGNOSIS — Z7952 Long term (current) use of systemic steroids: Secondary | ICD-10-CM | POA: Diagnosis not present

## 2018-11-22 DIAGNOSIS — I110 Hypertensive heart disease with heart failure: Secondary | ICD-10-CM | POA: Diagnosis not present

## 2018-11-22 NOTE — Telephone Encounter (Signed)
Verbal ok as below.

## 2018-11-24 ENCOUNTER — Other Ambulatory Visit: Payer: Self-pay | Admitting: Internal Medicine

## 2018-11-26 ENCOUNTER — Telehealth: Payer: Self-pay | Admitting: Family Medicine

## 2018-11-26 ENCOUNTER — Telehealth: Payer: Self-pay

## 2018-11-26 DIAGNOSIS — I255 Ischemic cardiomyopathy: Secondary | ICD-10-CM | POA: Diagnosis not present

## 2018-11-26 DIAGNOSIS — I0981 Rheumatic heart failure: Secondary | ICD-10-CM | POA: Diagnosis not present

## 2018-11-26 DIAGNOSIS — Z7952 Long term (current) use of systemic steroids: Secondary | ICD-10-CM | POA: Diagnosis not present

## 2018-11-26 DIAGNOSIS — I2510 Atherosclerotic heart disease of native coronary artery without angina pectoris: Secondary | ICD-10-CM | POA: Diagnosis not present

## 2018-11-26 DIAGNOSIS — Z9181 History of falling: Secondary | ICD-10-CM | POA: Diagnosis not present

## 2018-11-26 DIAGNOSIS — Z9981 Dependence on supplemental oxygen: Secondary | ICD-10-CM | POA: Diagnosis not present

## 2018-11-26 DIAGNOSIS — T462X5D Adverse effect of other antidysrhythmic drugs, subsequent encounter: Secondary | ICD-10-CM | POA: Diagnosis not present

## 2018-11-26 DIAGNOSIS — Z5181 Encounter for therapeutic drug level monitoring: Secondary | ICD-10-CM | POA: Diagnosis not present

## 2018-11-26 DIAGNOSIS — I714 Abdominal aortic aneurysm, without rupture: Secondary | ICD-10-CM | POA: Diagnosis not present

## 2018-11-26 DIAGNOSIS — I4821 Permanent atrial fibrillation: Secondary | ICD-10-CM | POA: Diagnosis not present

## 2018-11-26 DIAGNOSIS — I051 Rheumatic mitral insufficiency: Secondary | ICD-10-CM | POA: Diagnosis not present

## 2018-11-26 DIAGNOSIS — J984 Other disorders of lung: Secondary | ICD-10-CM | POA: Diagnosis not present

## 2018-11-26 DIAGNOSIS — E785 Hyperlipidemia, unspecified: Secondary | ICD-10-CM | POA: Diagnosis not present

## 2018-11-26 DIAGNOSIS — I5022 Chronic systolic (congestive) heart failure: Secondary | ICD-10-CM | POA: Diagnosis not present

## 2018-11-26 DIAGNOSIS — I110 Hypertensive heart disease with heart failure: Secondary | ICD-10-CM | POA: Diagnosis not present

## 2018-11-26 DIAGNOSIS — Z7901 Long term (current) use of anticoagulants: Secondary | ICD-10-CM | POA: Diagnosis not present

## 2018-11-26 NOTE — Telephone Encounter (Signed)
INR 1.4 and PT 17.1  Call pt with instructions

## 2018-11-26 NOTE — Telephone Encounter (Signed)
FYI...   Janelle with [facility] is calling to report the pt had a fall today.  She states Joseph Barton is not injured and was able to get back up on his won without help.    Thanks,   -Mickel Baas

## 2018-11-26 NOTE — Telephone Encounter (Signed)
Please advise 

## 2018-11-26 NOTE — Telephone Encounter (Signed)
Increase Coumadin 1 mg to 2 tablets daily and recheck Pro[facility]/INR in a week by home health nurse.

## 2018-11-27 ENCOUNTER — Emergency Department: Payer: Medicare Other

## 2018-11-27 ENCOUNTER — Inpatient Hospital Stay
Admission: EM | Admit: 2018-11-27 | Discharge: 2019-01-01 | DRG: 291 | Disposition: E | Payer: Medicare Other | Attending: Internal Medicine | Admitting: Internal Medicine

## 2018-11-27 ENCOUNTER — Ambulatory Visit: Payer: Self-pay

## 2018-11-27 ENCOUNTER — Other Ambulatory Visit: Payer: Self-pay

## 2018-11-27 DIAGNOSIS — E873 Alkalosis: Secondary | ICD-10-CM | POA: Diagnosis present

## 2018-11-27 DIAGNOSIS — Z7951 Long term (current) use of inhaled steroids: Secondary | ICD-10-CM | POA: Diagnosis not present

## 2018-11-27 DIAGNOSIS — I110 Hypertensive heart disease with heart failure: Secondary | ICD-10-CM | POA: Diagnosis not present

## 2018-11-27 DIAGNOSIS — J189 Pneumonia, unspecified organism: Secondary | ICD-10-CM | POA: Diagnosis not present

## 2018-11-27 DIAGNOSIS — I499 Cardiac arrhythmia, unspecified: Secondary | ICD-10-CM | POA: Diagnosis not present

## 2018-11-27 DIAGNOSIS — I255 Ischemic cardiomyopathy: Secondary | ICD-10-CM | POA: Diagnosis not present

## 2018-11-27 DIAGNOSIS — I4891 Unspecified atrial fibrillation: Secondary | ICD-10-CM | POA: Diagnosis not present

## 2018-11-27 DIAGNOSIS — Z1159 Encounter for screening for other viral diseases: Secondary | ICD-10-CM

## 2018-11-27 DIAGNOSIS — J8410 Pulmonary fibrosis, unspecified: Secondary | ICD-10-CM | POA: Diagnosis not present

## 2018-11-27 DIAGNOSIS — Z85828 Personal history of other malignant neoplasm of skin: Secondary | ICD-10-CM | POA: Diagnosis not present

## 2018-11-27 DIAGNOSIS — R778 Other specified abnormalities of plasma proteins: Secondary | ICD-10-CM

## 2018-11-27 DIAGNOSIS — R0689 Other abnormalities of breathing: Secondary | ICD-10-CM

## 2018-11-27 DIAGNOSIS — Z8701 Personal history of pneumonia (recurrent): Secondary | ICD-10-CM

## 2018-11-27 DIAGNOSIS — T462X5A Adverse effect of other antidysrhythmic drugs, initial encounter: Secondary | ICD-10-CM | POA: Diagnosis present

## 2018-11-27 DIAGNOSIS — I213 ST elevation (STEMI) myocardial infarction of unspecified site: Secondary | ICD-10-CM | POA: Diagnosis not present

## 2018-11-27 DIAGNOSIS — R918 Other nonspecific abnormal finding of lung field: Secondary | ICD-10-CM | POA: Diagnosis not present

## 2018-11-27 DIAGNOSIS — I4821 Permanent atrial fibrillation: Secondary | ICD-10-CM | POA: Diagnosis present

## 2018-11-27 DIAGNOSIS — Z8249 Family history of ischemic heart disease and other diseases of the circulatory system: Secondary | ICD-10-CM | POA: Diagnosis not present

## 2018-11-27 DIAGNOSIS — I5043 Acute on chronic combined systolic (congestive) and diastolic (congestive) heart failure: Secondary | ICD-10-CM | POA: Diagnosis not present

## 2018-11-27 DIAGNOSIS — J9621 Acute and chronic respiratory failure with hypoxia: Secondary | ICD-10-CM

## 2018-11-27 DIAGNOSIS — Z79899 Other long term (current) drug therapy: Secondary | ICD-10-CM | POA: Diagnosis not present

## 2018-11-27 DIAGNOSIS — R0602 Shortness of breath: Secondary | ICD-10-CM

## 2018-11-27 DIAGNOSIS — J90 Pleural effusion, not elsewhere classified: Secondary | ICD-10-CM | POA: Diagnosis not present

## 2018-11-27 DIAGNOSIS — I2510 Atherosclerotic heart disease of native coronary artery without angina pectoris: Secondary | ICD-10-CM | POA: Diagnosis present

## 2018-11-27 DIAGNOSIS — Z87891 Personal history of nicotine dependence: Secondary | ICD-10-CM | POA: Diagnosis not present

## 2018-11-27 DIAGNOSIS — Z955 Presence of coronary angioplasty implant and graft: Secondary | ICD-10-CM

## 2018-11-27 DIAGNOSIS — R0603 Acute respiratory distress: Secondary | ICD-10-CM

## 2018-11-27 DIAGNOSIS — Z86711 Personal history of pulmonary embolism: Secondary | ICD-10-CM

## 2018-11-27 DIAGNOSIS — J9600 Acute respiratory failure, unspecified whether with hypoxia or hypercapnia: Secondary | ICD-10-CM | POA: Diagnosis not present

## 2018-11-27 DIAGNOSIS — Z515 Encounter for palliative care: Secondary | ICD-10-CM | POA: Diagnosis not present

## 2018-11-27 DIAGNOSIS — Z7901 Long term (current) use of anticoagulants: Secondary | ICD-10-CM | POA: Diagnosis not present

## 2018-11-27 DIAGNOSIS — Z9981 Dependence on supplemental oxygen: Secondary | ICD-10-CM

## 2018-11-27 DIAGNOSIS — I5033 Acute on chronic diastolic (congestive) heart failure: Secondary | ICD-10-CM | POA: Diagnosis not present

## 2018-11-27 DIAGNOSIS — R7989 Other specified abnormal findings of blood chemistry: Secondary | ICD-10-CM | POA: Diagnosis not present

## 2018-11-27 DIAGNOSIS — J984 Other disorders of lung: Secondary | ICD-10-CM | POA: Diagnosis not present

## 2018-11-27 DIAGNOSIS — I248 Other forms of acute ischemic heart disease: Secondary | ICD-10-CM | POA: Diagnosis present

## 2018-11-27 DIAGNOSIS — E785 Hyperlipidemia, unspecified: Secondary | ICD-10-CM | POA: Diagnosis not present

## 2018-11-27 DIAGNOSIS — I5023 Acute on chronic systolic (congestive) heart failure: Secondary | ICD-10-CM | POA: Diagnosis not present

## 2018-11-27 DIAGNOSIS — T462X1A Poisoning by other antidysrhythmic drugs, accidental (unintentional), initial encounter: Secondary | ICD-10-CM | POA: Diagnosis not present

## 2018-11-27 DIAGNOSIS — R791 Abnormal coagulation profile: Secondary | ICD-10-CM | POA: Diagnosis present

## 2018-11-27 DIAGNOSIS — Z7189 Other specified counseling: Secondary | ICD-10-CM

## 2018-11-27 DIAGNOSIS — I482 Chronic atrial fibrillation, unspecified: Secondary | ICD-10-CM | POA: Diagnosis not present

## 2018-11-27 DIAGNOSIS — J9622 Acute and chronic respiratory failure with hypercapnia: Secondary | ICD-10-CM | POA: Diagnosis not present

## 2018-11-27 DIAGNOSIS — I4510 Unspecified right bundle-branch block: Secondary | ICD-10-CM | POA: Diagnosis not present

## 2018-11-27 DIAGNOSIS — J9601 Acute respiratory failure with hypoxia: Secondary | ICD-10-CM | POA: Diagnosis not present

## 2018-11-27 DIAGNOSIS — I959 Hypotension, unspecified: Secondary | ICD-10-CM | POA: Diagnosis not present

## 2018-11-27 DIAGNOSIS — Z66 Do not resuscitate: Secondary | ICD-10-CM | POA: Diagnosis not present

## 2018-11-27 DIAGNOSIS — R740 Nonspecific elevation of levels of transaminase and lactic acid dehydrogenase [LDH]: Secondary | ICD-10-CM | POA: Diagnosis not present

## 2018-11-27 LAB — CBC WITH DIFFERENTIAL/PLATELET
Abs Immature Granulocytes: 0.17 10*3/uL — ABNORMAL HIGH (ref 0.00–0.07)
Basophils Absolute: 0 10*3/uL (ref 0.0–0.1)
Basophils Relative: 0 %
Eosinophils Absolute: 0 10*3/uL (ref 0.0–0.5)
Eosinophils Relative: 0 %
HCT: 41.1 % (ref 39.0–52.0)
Hemoglobin: 13.2 g/dL (ref 13.0–17.0)
Immature Granulocytes: 2 %
Lymphocytes Relative: 9 %
Lymphs Abs: 1 10*3/uL (ref 0.7–4.0)
MCH: 29.8 pg (ref 26.0–34.0)
MCHC: 32.1 g/dL (ref 30.0–36.0)
MCV: 92.8 fL (ref 80.0–100.0)
Monocytes Absolute: 0.7 10*3/uL (ref 0.1–1.0)
Monocytes Relative: 6 %
Neutro Abs: 9.6 10*3/uL — ABNORMAL HIGH (ref 1.7–7.7)
Neutrophils Relative %: 83 %
Platelets: 160 10*3/uL (ref 150–400)
RBC: 4.43 MIL/uL (ref 4.22–5.81)
RDW: 15.7 % — ABNORMAL HIGH (ref 11.5–15.5)
WBC: 11.5 10*3/uL — ABNORMAL HIGH (ref 4.0–10.5)
nRBC: 0 % (ref 0.0–0.2)

## 2018-11-27 LAB — TROPONIN I
Troponin I: 0.03 ng/mL (ref <0.03)
Troponin I: <0.03 ng/mL (ref <0.03)

## 2018-11-27 LAB — MAGNESIUM: Magnesium: 1.8 mg/dL (ref 1.7–2.4)

## 2018-11-27 LAB — BRAIN NATRIURETIC PEPTIDE: B Natriuretic Peptide: 465 pg/mL — ABNORMAL HIGH (ref 0.0–100.0)

## 2018-11-27 LAB — URINALYSIS, COMPLETE (UACMP) WITH MICROSCOPIC
Bacteria, UA: NONE SEEN
Bilirubin Urine: NEGATIVE
Glucose, UA: NEGATIVE mg/dL
Hgb urine dipstick: NEGATIVE
Ketones, ur: NEGATIVE mg/dL
Leukocytes,Ua: NEGATIVE
Nitrite: NEGATIVE
Protein, ur: NEGATIVE mg/dL
Specific Gravity, Urine: 1.016 (ref 1.005–1.030)
pH: 7 (ref 5.0–8.0)

## 2018-11-27 LAB — COMPREHENSIVE METABOLIC PANEL
ALT: 23 U/L (ref 0–44)
AST: 23 U/L (ref 15–41)
Albumin: 2.9 g/dL — ABNORMAL LOW (ref 3.5–5.0)
Alkaline Phosphatase: 66 U/L (ref 38–126)
Anion gap: 12 (ref 5–15)
BUN: 25 mg/dL — ABNORMAL HIGH (ref 8–23)
CO2: 26 mmol/L (ref 22–32)
Calcium: 8.5 mg/dL — ABNORMAL LOW (ref 8.9–10.3)
Chloride: 99 mmol/L (ref 98–111)
Creatinine, Ser: 1.35 mg/dL — ABNORMAL HIGH (ref 0.61–1.24)
GFR calc Af Amer: 53 mL/min — ABNORMAL LOW (ref >60)
GFR calc non Af Amer: 46 mL/min — ABNORMAL LOW (ref >60)
Glucose, Bld: 106 mg/dL — ABNORMAL HIGH (ref 70–99)
Potassium: 4.4 mmol/L (ref 3.5–5.1)
Sodium: 137 mmol/L (ref 135–145)
Total Bilirubin: 0.7 mg/dL (ref 0.3–1.2)
Total Protein: 7 g/dL (ref 6.5–8.1)

## 2018-11-27 LAB — PROCALCITONIN: Procalcitonin: <0.1 ng/mL

## 2018-11-27 LAB — PROTIME-INR
INR: 1.4 — ABNORMAL HIGH (ref 0.8–1.2)
Prothrombin Time: 16.8 seconds — ABNORMAL HIGH (ref 11.4–15.2)

## 2018-11-27 LAB — LACTIC ACID, PLASMA
Lactic Acid, Venous: 4.7 mmol/L (ref 0.5–1.9)
Lactic Acid, Venous: 2.2 mmol/L (ref 0.5–1.9)

## 2018-11-27 LAB — BLOOD GAS, VENOUS
Acid-Base Excess: 2.2 mmol/L — ABNORMAL HIGH (ref 0.0–2.0)
Bicarbonate: 25.3 mmol/L (ref 20.0–28.0)
O2 Saturation: 90.9 %
Patient temperature: 37
pCO2, Ven: 34 mmHg — ABNORMAL LOW (ref 44.0–60.0)
pH, Ven: 7.48 — ABNORMAL HIGH (ref 7.250–7.430)
pO2, Ven: 56 mmHg — ABNORMAL HIGH (ref 32.0–45.0)

## 2018-11-27 LAB — DIGOXIN LEVEL: Digoxin Level: <0.2 ng/mL — ABNORMAL LOW (ref 0.8–2.0)

## 2018-11-27 LAB — SARS CORONAVIRUS 2 BY RT PCR (HOSPITAL ORDER, PERFORMED IN ~~LOC~~ HOSPITAL LAB): SARS Coronavirus 2: NEGATIVE

## 2018-11-27 MED ORDER — ALBUTEROL SULFATE (2.5 MG/3ML) 0.083% IN NEBU: 2.5000 mg | INHALATION_SOLUTION | Freq: Four times a day (QID) | RESPIRATORY_TRACT | Status: DC | PRN

## 2018-11-27 MED ORDER — PREDNISONE 10 MG PO TABS
40.0000 mg | ORAL_TABLET | Freq: Every day | ORAL | Status: DC
  Administered 2018-11-28 – 2018-12-04 (×7): 40 mg via ORAL
  Filled 2018-11-27: qty 4
  Filled 2018-11-27 (×2): qty 2
  Filled 2018-11-27 (×2): qty 4
  Filled 2018-11-27: qty 2
  Filled 2018-11-27: qty 4

## 2018-11-27 MED ORDER — BENZONATATE 100 MG PO CAPS: 100.0000 mg | ORAL_CAPSULE | Freq: Three times a day (TID) | ORAL | Status: DC | PRN

## 2018-11-27 MED ORDER — SODIUM CHLORIDE 0.9 % IV BOLUS
250.0000 mL | Freq: Once | INTRAVENOUS | Status: AC
  Administered 2018-11-27: 250 mL via INTRAVENOUS

## 2018-11-27 MED ORDER — ADULT MULTIVITAMIN W/MINERALS CH
1.0000 | ORAL_TABLET | Freq: Every day | ORAL | Status: DC
  Administered 2018-11-27 – 2018-12-04 (×8): 1 via ORAL
  Filled 2018-11-27 (×8): qty 1

## 2018-11-27 MED ORDER — TAMSULOSIN HCL 0.4 MG PO CAPS
0.4000 mg | ORAL_CAPSULE | Freq: Every day | ORAL | Status: DC
  Administered 2018-11-27 – 2018-12-04 (×8): 0.4 mg via ORAL
  Filled 2018-11-27 (×8): qty 1

## 2018-11-27 MED ORDER — WARFARIN SODIUM 2 MG PO TABS
2.0000 mg | ORAL_TABLET | Freq: Once | ORAL | Status: AC
  Administered 2018-11-27: 2 mg via ORAL
  Filled 2018-11-27: qty 1

## 2018-11-27 MED ORDER — METHYLPREDNISOLONE SODIUM SUCC 125 MG IJ SOLR
60.0000 mg | Freq: Once | INTRAMUSCULAR | Status: AC
  Administered 2018-11-27: 60 mg via INTRAVENOUS
  Filled 2018-11-27: qty 2

## 2018-11-27 MED ORDER — BISACODYL 5 MG PO TBEC: 5.0000 mg | DELAYED_RELEASE_TABLET | Freq: Every day | ORAL | Status: DC | PRN

## 2018-11-27 MED ORDER — METHYLPREDNISOLONE SODIUM SUCC 125 MG IJ SOLR
60.0000 mg | Freq: Four times a day (QID) | INTRAMUSCULAR | Status: DC
  Administered 2018-11-27: 60 mg via INTRAVENOUS
  Filled 2018-11-27: qty 2

## 2018-11-27 MED ORDER — FLUTICASONE PROPIONATE 50 MCG/ACT NA SUSP
1.0000 | Freq: Every day | NASAL | Status: DC
  Administered 2018-11-27 – 2018-12-04 (×7): 1 via NASAL
  Filled 2018-11-27: qty 16

## 2018-11-27 MED ORDER — METHYLPREDNISOLONE SODIUM SUCC 125 MG IJ SOLR: 125.0000 mg | Freq: Once | INTRAMUSCULAR | Status: DC

## 2018-11-27 MED ORDER — FUROSEMIDE 10 MG/ML IJ SOLN
20.0000 mg | Freq: Two times a day (BID) | INTRAMUSCULAR | Status: DC
  Administered 2018-11-27 – 2018-11-28 (×2): 20 mg via INTRAVENOUS
  Filled 2018-11-27 (×3): qty 2

## 2018-11-27 MED ORDER — IPRATROPIUM-ALBUTEROL 0.5-2.5 (3) MG/3ML IN SOLN
3.0000 mL | Freq: Four times a day (QID) | RESPIRATORY_TRACT | Status: DC
  Administered 2018-11-27: 3 mL via RESPIRATORY_TRACT
  Filled 2018-11-27: qty 3

## 2018-11-27 MED ORDER — DOCUSATE SODIUM 100 MG PO CAPS
200.0000 mg | ORAL_CAPSULE | Freq: Two times a day (BID) | ORAL | Status: DC
  Administered 2018-11-27 – 2018-12-04 (×15): 200 mg via ORAL
  Filled 2018-11-27 (×15): qty 2

## 2018-11-27 MED ORDER — TRAZODONE HCL 50 MG PO TABS
50.0000 mg | ORAL_TABLET | Freq: Every evening | ORAL | Status: DC | PRN
  Administered 2018-11-28 – 2018-12-01 (×2): 50 mg via ORAL
  Filled 2018-11-27 (×2): qty 1

## 2018-11-27 MED ORDER — ALBUTEROL SULFATE HFA 108 (90 BASE) MCG/ACT IN AERS: 2.0000 | INHALATION_SPRAY | Freq: Four times a day (QID) | RESPIRATORY_TRACT | Status: DC | PRN

## 2018-11-27 MED ORDER — WARFARIN - PHARMACIST DOSING INPATIENT
Freq: Every day | Status: DC
  Administered 2018-11-27 – 2018-12-02 (×4)

## 2018-11-27 MED ORDER — SULFAMETHOXAZOLE-TRIMETHOPRIM 400-80 MG PO TABS
1.0000 | ORAL_TABLET | Freq: Two times a day (BID) | ORAL | Status: DC
  Administered 2018-11-27 – 2018-12-04 (×14): 1 via ORAL
  Filled 2018-11-27 (×15): qty 1

## 2018-11-27 MED ORDER — DIGOXIN 125 MCG PO TABS
0.0625 mg | ORAL_TABLET | Freq: Every day | ORAL | Status: DC
  Administered 2018-11-27 – 2018-12-02 (×6): 0.0625 mg via ORAL
  Filled 2018-11-27 (×6): qty 0.5

## 2018-11-27 MED ORDER — PANTOPRAZOLE SODIUM 40 MG PO TBEC
40.0000 mg | DELAYED_RELEASE_TABLET | Freq: Every day | ORAL | Status: DC
  Administered 2018-11-27 – 2018-12-04 (×8): 40 mg via ORAL
  Filled 2018-11-27 (×8): qty 1

## 2018-11-27 MED ORDER — BIOFLEX PO TABS: ORAL_TABLET | Freq: Every day | ORAL | Status: DC

## 2018-11-27 NOTE — ED Notes (Signed)
This RN spoke with pts wife, told her room number pt was being transferred to, told her he was breathing better and in good spirits. Wife was appreciative of call.

## 2018-11-27 NOTE — H&P (Addendum)
[facility] [place] NAME: Joseph Barton    MR#:  [id number]  DATE OF BIRTH:  10/17/1926  DATE OF ADMISSION:  11/11/2018  PRIMARY CARE PHYSICIAN: Jerrol Banana., MD   REQUESTING/REFERRING PHYSICIAN: Brenton Grills, MD  CHIEF COMPLAINT:   Chief Complaint  Patient presents with   Shortness of Breath   HISTORY OF PRESENT ILLNESS:  Joseph Barton  is a 85 y.o. male with a known history of ischemic cardiomyopathy, atrial fibrillation on Coumadin, hyperlipidemia, chronic systolic heart failure, MI, CAD status post percutaneous coronary angioplasty, AAA, amiodarone-induced lung injury, and hypertension presenting to the ED with chief complaints of worsening shortness of breath.  Patient reports onset of symptoms since yesterday night.  He denies associated productive cough, fever cough chills, chest pain, or diaphoresis.  Patient reports that he uses 2 L of oxygen at home and he seemed to require more oxygen due to difficulty breathing therefore his wife called EMS.  Per EMS patient's oxygen saturation was in the mid 70s on chronic 2 L upon arrival.  Patient was placed on nonrebreather with improvement noted.he was also given 324 mg of aspirin.  On arrival to the ED, he was afebrile with blood pressure 91/61 mm Hg and pulse rate 115 beats/min, respiration 34 and SPO2 96 on nonrebreather.  There were no focal neurological deficits; he was alert and oriented x4, and he did not demonstrate any memory deficits.  Labs revealed BUN 25, creatinine 1.35, BUN 2.9, elevated BNP of 465, troponins 0.03, WBC 11.5, lactic acid 4.7, digoxin level < 0.2, INR 1.4, procalcitonin <0.1, and UA negative for UTI; an insidious infectious workup was initiated, including SARS Coronavirus which was negative.  Chest x-ray showed acute on chronic airspace disease unchanged from previous.  He has since been transitioned from nonrebreather to nasal cannula and mentating well.  He  being admitted to hospitalist service for further management  PAST MEDICAL HISTORY:   Past Medical History:  Diagnosis Date   Abdominal aortic aneurysm ([place]) 01-2004   4.7 x 4.7 cm.   CAD (coronary artery disease) 01/25/2004   a. 01/2004 Ant MI/DES to LAD;  b. 10/2011 Neg MV.   Chronic systolic heart failure ([place])    a. 08/2016 Echo: EF 35%, nl RV fxn, mod to sev MR, mild to mod TR, mod biatrial enlargement.   Hyperlipidemia    Hypertension    Ischemic cardiomyopathy    a. 08/2016 Echo: EF 35%.   Moderate to Severe Mitral regurgitation    a. 08/2016 Echo: Mod-sev MR.   Permanent atrial fibrillation    a. Chronic coumadin (CHA2DS2VASc = 5).   Pulmonary nodules    Noted on abdominal CT   Statin intolerance     PAST SURGICAL HISTORY:   Past Surgical History:  Procedure Laterality Date   ABDOMINAL AORTIC ANEURYSM REPAIR     12/02/2007 [facility]   ABDOMINAL AORTIC ANEURYSM REPAIR  2006   [facility]    CARDIAC CATHETERIZATION  2009   [facility]   CORONARY ANGIOPLASTY WITH STENT PLACEMENT  2005   Cypher stent LAD    Cypher stents to LAD     01/25/2004   SKIN SURGERY  2016   [facility] skin cancer    SOCIAL HISTORY:   Social History   Tobacco Use   Smoking status: Former Smoker    Last attempt to quit: 12/01/1987    Years since quitting: 31.0   Smokeless tobacco: Never Used  Substance Use Topics   Alcohol use: No    FAMILY HISTORY:   Family History  Problem Relation Age of Onset   Heart attack Father 106       Died w/ Heart attack   Diabetes Father    Heart attack Brother    Heart disease Brother    Diabetes Sister    Heart disease Sister     DRUG ALLERGIES:   Allergies  Allergen Reactions   Atorvastatin    Simvastatin     REVIEW OF SYSTEMS:   Review of Systems  Constitutional: Negative for chills, fever, malaise/fatigue and weight loss.  HENT: Positive for hearing loss. Negative for congestion and sore throat.   Eyes: Negative for  blurred vision and double vision.  Respiratory: Positive for shortness of breath and wheezing. Negative for cough, hemoptysis and sputum production.   Cardiovascular: Negative for chest pain, palpitations, orthopnea and leg swelling.       Irregular heart rhythm  Gastrointestinal: Negative for abdominal pain, diarrhea, nausea and vomiting.  Genitourinary: Negative for dysuria and urgency.  Musculoskeletal: Negative for myalgias.  Skin: Negative for rash.  Neurological: Negative for dizziness, sensory change, speech change, focal weakness and headaches.  Psychiatric/Behavioral: Negative for depression.   MEDICATIONS AT HOME:   Prior to Admission medications   Medication Sig Start Date End Date Taking? Authorizing Provider  albuterol (PROVENTIL HFA;VENTOLIN HFA) 108 (90 Base) MCG/ACT inhaler Inhale 2 puffs into the lungs every 6 (six) hours as needed for wheezing or shortness of breath. 11/29/17   Jerrol Banana., MD  benzonatate (TESSALON) 100 MG capsule Take 1 capsule (100 mg total) by mouth 3 (three) times daily as needed for cough. 11/01/18   Vaughan Basta, MD  Bioflavonoid Products (BIOFLEX PO) Take 1 tablet by mouth daily.     [provider]  bisacodyl (DULCOLAX) 5 MG EC tablet Take 1 tablet (5 mg total) by mouth daily as needed for moderate constipation. 11/01/18   Vaughan Basta, MD  digoxin (LANOXIN) 0.125 MG tablet Take 0.0625 mg by mouth daily.     [provider]  docusate sodium (COLACE) 100 MG capsule Take 2 capsules (200 mg total) by mouth 2 (two) times daily. 11/01/18   Vaughan Basta, MD  fluticasone (FLONASE) 50 MCG/ACT nasal spray Place 1 spray into both nostrils daily. 11/02/18   Vaughan Basta, MD  furosemide (LASIX) 20 MG tablet TAKE 2 TABLETS BY MOUTH  ONCE DAILY 11/26/18   Deboraha Sprang, MD  guaiFENesin-dextromethorphan [facility] DM) 100-10 MG/5ML syrup Take 5 mLs by mouth every 4 (four) hours as needed for cough.  11/01/18   Vaughan Basta, MD  ipratropium-albuterol (DUONEB) 0.5-2.5 (3) MG/3ML SOLN Take 3 mLs by nebulization 2 (two) times daily. 11/01/18   Vaughan Basta, MD  Multiple Vitamin (MULTIVITAMIN WITH MINERALS) TABS tablet Take 1 tablet by mouth daily. 11/02/18   Vaughan Basta, MD  omeprazole (PRILOSEC) 20 MG capsule TAKE 1 CAPSULE BY MOUTH  DAILY 02/16/18   Jerrol Banana., MD  polyethylene glycol powder [facility]) powder Take 1 scoop by mouth daily. 07/29/18   Jerrol Banana., MD  predniSONE (DELTASONE) 20 MG tablet Use 40 mg daily for 20 days- until 11/22/18, then take 20 mg daily for next 20 days until 12/12/18., then take 10 mg daily for next 20 days until 12/31/18. 11/01/18   Vaughan Basta, MD  sulfamethoxazole-trimethoprim (BACTRIM) 400-80 MG tablet Take 1 tablet by mouth daily. 11/02/18 01/01/19  Vaughan Basta, MD  tamsulosin (FLOMAX) 0.4 MG CAPS capsule Take 1 capsule (0.4 mg total) by mouth daily. 07/29/18   Jerrol Banana., MD  traZODone (DESYREL) 50 MG tablet Take 1 tablet (50 mg total) by mouth at bedtime as needed for sleep. 11/01/18   Vaughan Basta, MD  warfarin (COUMADIN) 1 MG tablet Take 1 tablet (1 mg total) by mouth daily at 6 PM. 11/01/18   Vaughan Basta, MD      VITAL SIGNS:  Blood pressure (!) 104/56, pulse 80, temperature 98.4 F (36.9 C), temperature source Axillary, resp. rate (!) 35, height 5\' 9"  (1.753 m), weight 81.6 kg, SpO2 97 %.  PHYSICAL EXAMINATION:   Physical Exam  GENERAL:  85 y.o.-year-old patient lying in the bed with no acute distress.  EYES: Pupils equal, round, reactive to light and accommodation. No scleral icterus. Extraocular muscles intact.  HEENT: Head atraumatic, normocephalic. Oropharynx and nasopharynx clear.  NECK:  Supple, no jugular venous distention. No thyroid enlargement, no tenderness.  LUNGS: Decreased breath sounds bilaterally, mild wheezing. No rales,rhonchi or crepitation.  No use of accessory muscles of respiration.  CARDIOVASCULAR: S1, S2 normal. murmur, no rubs, or gallops.  ABDOMEN: Soft, nontender, nondistended. Bowel sounds present. No organomegaly or mass.  EXTREMITIES: No pedal edema, cyanosis, or clubbing.  NEUROLOGIC: Mental Status:Alert, oriented, thought content appropriate.  Speech fluent without evidence of aphasia.  Able to follow 3 step commands without difficulty. Attention span and concentration seemed appropriate  Cranial Nerves: II: Discs flat bilaterally; Visual fields grossly normal, pupils equal, round, reactive to light and accommodation III,IV, VI: ptosis not present, extra-ocular motions intact bilaterally V,VII: smile symmetric, facial light touch sensation intact VIII: hearing normal bilaterally IX,X: gag reflex present XI: bilateral shoulder shrug XII: midline tongue extension Muscle strength 5/5 in all extremities.  Tone and bulk:normal tone throughout; no atrophy noted Sensory: Pinprick and light touch intact bilaterally Deep Tendon Reflexes: 2+ and symmetric throughout Cerebellar:Absent ataxia Gait: not tested due to safety concerns PSYCHIATRIC: The patient is alert and oriented x 3.  SKIN: No obvious rash, lesion, or ulcer.   DATA REVIEWED:  LABORATORY PANEL:   CBC Recent Labs  Lab 11/26/2018 0603  WBC 11.5*  HGB 13.2  HCT 41.1  PLT 160   ------------------------------------------------------------------------------------------------------------------  Chemistries  Recent Labs  Lab 11/09/2018 0603  NA 137  K 4.4  CL 99  CO2 26  GLUCOSE 106*  BUN 25*  CREATININE 1.35*  CALCIUM 8.5*  MG 1.8  AST 23  ALT 23  ALKPHOS 66  BILITOT 0.7   ------------------------------------------------------------------------------------------------------------------  Cardiac Enzymes Recent Labs  Lab 11/10/2018 0603  TROPONINI 0.03*    ------------------------------------------------------------------------------------------------------------------  RADIOLOGY:  Dg Chest Portable 1 View  Result Date: 11/28/2018 CLINICAL DATA:  Shortness of breath with chronic lung injury EXAM: PORTABLE CHEST 1 VIEW COMPARISON:  10/31/2018 FINDINGS: Compared with priors there is chronic lung disease with superimposed acute airspace disease that is stable in extent. Amiodarone toxicity was noted per chart. No visible effusion or pneumothorax. Normal heart size. IMPRESSION: Acute on chronic airspace disease with unchanged opacity compared to 10/31/2018. Note that superimposed pneumonia or failure could easily be obscured by the extensive baseline opacity. Electronically Signed   By: Monte Fantasia M.D.   On: 11/01/2018 06:56    EKG:  EKG: atrial fibrillation, rate 104, RBBB. Vent. rate 104 BPM PR interval * ms QRS duration 178 ms QT/QTc 400/527 ms P-R-T axes * -72 95 IMPRESSION AND PLAN:   85 y.o. male with a known  history of ischemic cardiomyopathy, atrial fibrillation on Coumadin, hyperlipidemia, chronic systolic heart failure, MI, CAD status post percutaneous coronary angioplasty, AAA, amiodarone-induced lung injury, and hypertension presenting with worsening shortness of breath.  1. Acute on chronic hypoxic respiratory failure - Hx of recent amiodarone induced lung injury also with CHF on chronic home oxygen at 2 L. - Chest x-ray shows acute on chronic airspace disease with unchanged opacity from previous finding - Lactic acid improving, slightly elevated WBC with no signs of infection - Admit to telemetry floor - SARS Coronavirus negative - Pulmonology consult placed - Supplemental O2, goal sat 88-92% - Start Duonebs every 6 hourly - Corticosteroids: Solu-Medrol 60 mg IV every 6 hours  2. Acute on chronic diastolic Congestive Heart Failure-BNP elevated  Last Echo 10/23/18,  EF 25 to 30% - Beta-Blockade: on Metoprolol 25 mg XL  but held due to low BP - Diuretics:Furosemide 20 IV twice daily   IV diureses >1L negative per day until approach euvolemia / worsening renal function. - Continue digoxin, will monitor renal function - Low salt diet   3. Elevated troponin -Likely demand ischemia - Continue to trend troponin - EKG reviewed as above  4. Chronic atrial fibrillation -rate controlled on metoprolol - Coagulated with Coumadin - INR subtherapeutic - Coumadin management per pharmacy  5. Hypertension - Bp borderline low - Continue to monitor  6. DVT prophylaxis -on Coumadin  All the records are reviewed and case discussed with ED provider. Management plans discussed with the patient, family and they are in agreement.  CODE STATUS: DNR  TOTAL TIME TAKING CARE OF THIS PATIENT: 45 minutes.    on 11/08/2018 at 11:10 AM  This patient was staffed with Dr. Atha Starks, Manuella Ghazi who personally evaluated patient, reviewed documentation and agreed with assessment and plan of care as above.  Rufina Falco, DNP, FNP-BC [facility] Nurse Practitioner Between 7am to 6pm - Pager [phone]  After 6pm go to [email or web address] - password [email or web address] [facility]  Office  [phone]  CC: Primary care physician; Jerrol Banana., MD

## 2018-11-27 NOTE — ED Notes (Signed)
Pt resting, no complaints, applesauce given per request, denies SHOB at this time. VSS, bed locked and low, pulled pt up in bed with his assist and placed blanket. Pt appreciative. He is alert and oriented.

## 2018-11-27 NOTE — Progress Notes (Signed)
Talked to Dr. Manuella Ghazi about BP of 96/63, has scheduled IV lasix 20 mg, order to hold dose and hold parameter given to hold if SBP is less than 90. RN will continue to monitor.

## 2018-11-27 NOTE — ED Notes (Signed)
Date and time results received: 11/25/2018 7:01 AM  (use smartphrase ".now" to insert current time)  Test: trop Critical Value: 0.03  Name of Provider Notified: forbach  Orders Received? Or Actions Taken?: monitor

## 2018-11-27 NOTE — Consult Note (Addendum)
ANTICOAGULATION CONSULT NOTE - Initial Consult  Pharmacy Consult for Warfarin Indication: atrial fibrillation  Allergies  Allergen Reactions  . Atorvastatin   . Simvastatin     Patient Measurements: Height: 5\' 9"  (175.3 cm) Weight: 180 lb (81.6 kg) IBW/kg (Calculated) : 70.7  Vital Signs: Temp: 97.7 F (36.5 C) (05/27 1127) Temp Source: Oral (05/27 1127) BP: 111/66 (05/27 1127) Pulse Rate: 67 (05/27 1127)  Labs: Recent Labs    11/26/2018 0603 11/05/2018 0654  HGB 13.2  --   HCT 41.1  --   PLT 160  --   LABPROT  --  16.8*  INR  --  1.4*  CREATININE 1.35*  --   TROPONINI 0.03*  --     Estimated Creatinine Clearance: 36.4 mL/min (A) (by C-G formula based on SCr of 1.35 mg/dL (H)).   Medical History: Past Medical History:  Diagnosis Date  . Abdominal aortic aneurysm ([place]) 01-2004   4.7 x 4.7 cm.  Marland Kitchen CAD (coronary artery disease) 01/25/2004   a. 01/2004 Ant MI/DES to LAD;  b. 10/2011 Neg MV.  . Chronic systolic heart failure ([place])    a. 08/2016 Echo: EF 35%, nl RV fxn, mod to sev MR, mild to mod TR, mod biatrial enlargement.  . Hyperlipidemia   . Hypertension   . Ischemic cardiomyopathy    a. 08/2016 Echo: EF 35%.  . Moderate to Severe Mitral regurgitation    a. 08/2016 Echo: Mod-sev MR.  Marland Kitchen Permanent atrial fibrillation    a. Chronic coumadin (CHA2DS2VASc = 5).  . Pulmonary nodules    Noted on abdominal CT  . Statin intolerance     Medications:  PTA dose 1mg  Warfarin qd (5/26 [facility] from [facility] - primary care provider to increase dose to 2mg  qd)  Assessment: Permanent A Fib with CHA2DS2VASc score of 5.    INR 1.4  Goal of Therapy:  INR 2-3 Monitor platelets by anticoagulation protocol: Yes   Plan:  Will continue newly increased dose of 2mg  daily.   Will check INR daily and CBC a minimum of every 3 days while on warfarin.  Lu Duffel, PharmD, BCPS Clinical Pharmacist 11/05/2018 11:54 AM

## 2018-11-27 NOTE — ED Provider Notes (Signed)
[facility] Emergency Department Provider Note  ____________________________________________   First MD Initiated Contact with Patient 11/05/2018 [phone]     (approximate)  I have reviewed the triage vital signs and the nursing notes.   HISTORY  Chief Complaint Shortness of Breath  Level 5 caveat:  history/ROS limited by acute/critical illness  HPI Joseph Barton is a 85 y.o. 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  He was evaluated for possible multi lobar pneumonia versus CHF and eventually found that he did not have an infection although he does have a reduced ejection fraction.  He started amiodarone about 4 months ago and apparently developed significant lung injury as result and has been taken off of it.  He takes warfarin chronically for his atrial fibrillation.  He was tested for coronavirus last month and was found to be negative.  He comes from a private home and not from a nursing facility.  He presents tonight by EMS for evaluation of acute shortness of breath.  He uses 2 L of oxygen at home and his wife called EMS because he could not breathe.  EMS reports that his SPO2 was in the 70s on 2 L when they arrived.  On a nonrebreather he came up to the mid upper 90s and was in no apparent distress when they arrived to the ED.  He denies chest pain.  He denies fever/chills, cough, nausea, vomiting, and abdominal pain.  Nothing in particular made his symptoms worse and being on the nonrebreather made his shortness of breath better.   Of note, the 12-lead obtained by the paramedics in the field indicated ST elevation and indicated it was a STEMI so they called a STEMI in the field.        Past Medical History:  Diagnosis Date   Abdominal aortic aneurysm ([place]) 01-2004   4.7 x 4.7 cm.   CAD (coronary artery disease)  01/25/2004   a. 01/2004 Ant MI/DES to LAD;  b. 10/2011 Neg MV.   Chronic systolic heart failure ([place])    a. 08/2016 Echo: EF 35%, nl RV fxn, mod to sev MR, mild to mod TR, mod biatrial enlargement.   Hyperlipidemia    Hypertension    Ischemic cardiomyopathy    a. 08/2016 Echo: EF 35%.   Moderate to Severe Mitral regurgitation    a. 08/2016 Echo: Mod-sev MR.   Permanent atrial fibrillation    a. Chronic coumadin (CHA2DS2VASc = 5).   Pulmonary nodules    Noted on abdominal CT   Statin intolerance     Patient Active Problem List   Diagnosis Date Noted   Acute respiratory failure with hypoxia (HCC)    Acute on chronic systolic heart failure ([place])    HCAP (healthcare-associated pneumonia) 10/20/2018   Malnutrition of moderate degree 07/18/2018   Pneumonia 07/15/2018   Hypotension 12/12/2017   CHF (congestive heart failure) ([place]) 2020/10/317   Subclinical hypothyroidism 10/06/2015   Allergic rhinitis 09/10/2015   Bradycardia 09/10/2015   Failure of erection 09/10/2015   Blood glucose elevated 09/10/2015   BP (high blood pressure) 09/10/2015   Neuropathy 09/10/2015   Apnea, sleep 09/10/2015   Cancer of skin, squamous cell 09/10/2015   Acid reflux 04/28/2015   Arthritis, degenerative 12/25/2014   Cardiomyopathy, ischemic 09/27/2013   HLD (hyperlipidemia) 09/27/2013   Lung nodule, multiple 09/27/2013   Drug intolerance 09/27/2013  Ischemic cardiomyopathy 12/01/2011   Atrial fibrillation ([place]) 67/61/9509   Chronic systolic heart failure ([place]) 12/01/2011   IVCD (intraventricular conduction defect) 12/01/2011   Intraventricular block 12/01/2011   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 Authorizing Provider  albuterol (PROVENTIL HFA;VENTOLIN HFA) 108 (90 Base) MCG/ACT inhaler Inhale 2 puffs into the lungs every 6 (six) hours as needed for wheezing or shortness of breath. 11/29/17   Jerrol Banana., MD  benzonatate (TESSALON) 100 MG capsule Take 1 capsule (100 mg total) by mouth 3 (three) times daily as needed for cough. 11/01/18   Vaughan Basta, MD  Bioflavonoid Products (BIOFLEX PO) Take 1 tablet by mouth daily.     [provider]  bisacodyl (DULCOLAX) 5 MG EC tablet Take 1 tablet (5 mg total) by mouth daily as needed for moderate constipation. 11/01/18   Vaughan Basta, MD  digoxin (LANOXIN) 0.125 MG tablet Take 0.0625 mg by mouth daily.     [provider]  docusate sodium (COLACE) 100 MG capsule Take 2 capsules (200 mg total) by mouth 2 (two) times daily. 11/01/18   Vaughan Basta, MD  fluticasone (FLONASE) 50 MCG/ACT nasal spray Place 1 spray into both nostrils daily. 11/02/18   Vaughan Basta, MD  furosemide (LASIX) 20 MG tablet TAKE 2 TABLETS BY MOUTH  ONCE DAILY 11/26/18   Deboraha Sprang, MD  guaiFENesin-dextromethorphan [facility] DM) 100-10 MG/5ML syrup Take 5 mLs by mouth every 4 (four) hours as needed for cough. 11/01/18   Vaughan Basta, MD  ipratropium-albuterol (DUONEB) 0.5-2.5 (3) MG/3ML SOLN Take 3 mLs by nebulization 2 (two) times daily. 11/01/18   Vaughan Basta, MD  Multiple Vitamin (MULTIVITAMIN WITH MINERALS) TABS tablet Take 1 tablet by mouth daily. 11/02/18   Vaughan Basta, MD  omeprazole (PRILOSEC) 20 MG capsule TAKE 1 CAPSULE BY MOUTH   DAILY 02/16/18   Jerrol Banana., MD  polyethylene glycol powder [facility]) powder Take 1 scoop by mouth daily. 07/29/18   Jerrol Banana., MD  predniSONE (DELTASONE) 20 MG tablet Use 40 mg daily for 20 days- until 11/22/18, then take 20 mg daily for next 20 days until 12/12/18., then take 10 mg daily for next 20 days until 12/31/18. 11/01/18   Vaughan Basta, MD  sulfamethoxazole-trimethoprim (BACTRIM) 400-80 MG tablet Take 1 tablet by mouth daily. 11/02/18 01/01/19  Vaughan Basta, MD  tamsulosin (FLOMAX) 0.4 MG CAPS capsule Take 1 capsule (0.4 mg total) by mouth daily. 07/29/18   Jerrol Banana., MD  traZODone (DESYREL) 50 MG tablet Take 1 tablet (50 mg total) by mouth at bedtime as needed for sleep. 11/01/18   Vaughan Basta, MD  warfarin (COUMADIN) 1 MG tablet Take 1 tablet (1 mg total) by mouth daily at 6 PM. 11/01/18   Vaughan Basta, MD  Allergies Atorvastatin and Simvastatin  Family History  Problem Relation Age of Onset   Heart attack Father 15       Died w/ Heart attack   Diabetes Father    Heart attack Brother    Heart disease Brother    Diabetes Sister    Heart disease Sister     Social History Social History   Tobacco Use   Smoking status: Former Smoker    Last attempt to quit: 12/01/1987    Years since quitting: 31.0   Smokeless tobacco: Never Used  Substance Use Topics   Alcohol use: No   Drug use: No    Review of Systems Level 5 caveat:  history/ROS limited by acute/critical illness.  The patient apparently had shortness of breath previously but now no longer has shortness of breath as long as he is on a nonrebreather.  ____________________________________________   PHYSICAL EXAM:  VITAL SIGNS: ED Triage Vitals  Enc Vitals Group     BP 11/07/2018 0555 91/61     Pulse Rate 11/26/2018 0551 (!) 114     Resp 11/26/2018 0551 (!) 35     Temp 11/01/2018 0555 98.4 F (36.9 C)     Temp Source 11/13/2018 0555 Axillary      SpO2 11/09/2018 0555 96 %     Weight 11/19/2018 0552 81.6 kg (180 lb)     Height 11/01/2018 0552 1.753 m (5\' 9" )     Head Circumference --      Peak Flow --      Pain Score 11/13/2018 0554 0     Pain Loc --      Pain Edu? --      Excl. in [place]? --     Constitutional: Alert and oriented.  Elderly but generally well-appearing in spite of his chronic medical conditions.  Currently not in distress. Eyes: Conjunctivae are normal.  Head: Atraumatic. Nose: No congestion/rhinnorhea. Mouth/Throat: Mucous membranes are moist. Neck: No stridor.  No meningeal signs.   Cardiovascular: Irregularly irregular rhythm with some mild tachycardia. Respiratory: Increased respiratory rate.  No accessory muscle usage at this time.  Moving adequate air and speaking in full sentences. Gastrointestinal: Soft and nontender. No distention.  Musculoskeletal: No lower extremity tenderness nor edema. No gross deformities of extremities. Neurologic:  Normal speech and language. No gross focal neurologic deficits are appreciated.  Skin:  Skin is warm, dry and intact. No rash noted. Psychiatric: Mood and affect are normal. Speech and behavior are normal.  ____________________________________________   LABS (all labs ordered are listed, but only abnormal results are displayed)  Labs Reviewed  COMPREHENSIVE METABOLIC PANEL - Abnormal; Notable for the following components:      Result Value   Glucose, Bld 106 (*)    BUN 25 (*)    Creatinine, Ser 1.35 (*)    Calcium 8.5 (*)    Albumin 2.9 (*)    GFR calc non Af Amer 46 (*)    GFR calc Af Amer 53 (*)    All other components within normal limits  BRAIN NATRIURETIC PEPTIDE - Abnormal; Notable for the following components:   B Natriuretic Peptide 465.0 (*)    All other components within normal limits  TROPONIN I - Abnormal; Notable for the following components:   Troponin I 0.03 (*)    All other components within normal limits  CBC WITH DIFFERENTIAL/PLATELET -  Abnormal; Notable for the following components:   WBC 11.5 (*)    RDW 15.7 (*)  Neutro Abs 9.6 (*)    Abs Immature Granulocytes 0.17 (*)    All other components within normal limits  LACTIC ACID, PLASMA - Abnormal; Notable for the following components:   Lactic Acid, Venous 4.7 (*)    All other components within normal limits  URINALYSIS, COMPLETE (UACMP) WITH MICROSCOPIC - Abnormal; Notable for the following components:   Color, Urine YELLOW (*)    APPearance CLEAR (*)    All other components within normal limits  SARS CORONAVIRUS 2 (HOSPITAL ORDER, [facility])  URINE CULTURE  MAGNESIUM  LACTIC ACID, PLASMA  BLOOD GAS, VENOUS  PROCALCITONIN  DIGOXIN LEVEL  PROTIME-INR   ____________________________________________  EKG  ED ECG REPORT I, Hinda Kehr, the attending physician, personally viewed and interpreted this ECG.  Date: 11/20/2018 EKG Time: 5:52 AM Rate: 104 Rhythm: Atrial fibrillation with borderline and intermittent RVR (I saw rates as high as the 120s on the monitor) QRS Axis: Left axis deviation Intervals: Left bundle branch block and possible left anterior fascicular block ST/T Wave abnormalities: Non-specific ST segment / T-wave changes, but no clear evidence of acute ischemia. Narrative Interpretation: no definitive evidence of acute ischemia; does not meet STEMI criteria.   ____________________________________________  RADIOLOGY I, Hinda Kehr, personally viewed and evaluated these images (plain radiographs) as part of my medical decision making, as well as reviewing the written report by the radiologist.  ED MD interpretation:  Chronic lung disease, doubt acute infection  Official radiology report(s): Dg Chest Portable 1 View  Result Date: 11/04/2018 CLINICAL DATA:  Shortness of breath with chronic lung injury EXAM: PORTABLE CHEST 1 VIEW COMPARISON:  10/31/2018 FINDINGS: Compared with priors there is chronic lung disease  with superimposed acute airspace disease that is stable in extent. Amiodarone toxicity was noted per chart. No visible effusion or pneumothorax. Normal heart size. IMPRESSION: Acute on chronic airspace disease with unchanged opacity compared to 10/31/2018. Note that superimposed pneumonia or failure could easily be obscured by the extensive baseline opacity. Electronically Signed   By: Monte Fantasia M.D.   On: 11/26/2018 06:56    ____________________________________________   PROCEDURES   Procedure(s) performed (including Critical Care):  .Critical Care Performed by: Hinda Kehr, MD Authorized by: Hinda Kehr, MD   Critical care provider statement:    Critical care time (minutes):  30   Critical care time was exclusive of:  Separately billable procedures and treating other patients   Critical care was necessary to treat or prevent imminent or life-threatening deterioration of the following conditions:  Respiratory failure   Critical care was time spent personally by me on the following activities:  Development of treatment plan with patient or surrogate, discussions with consultants, evaluation of patient's response to treatment, examination of patient, obtaining history from patient or surrogate, ordering and performing treatments and interventions, ordering and review of laboratory studies, ordering and review of radiographic studies, pulse oximetry, re-evaluation of patient's condition and review of old charts     ____________________________________________   [place] / MDM / [place] / ED COURSE  As part of my medical decision making, I reviewed the following data within the [place] notes reviewed and incorporated, Labs reviewed , EKG interpreted , Old EKG reviewed, Old chart reviewed, Patient signed out to Dr. Joni Fears, Radiograph reviewed , Discussed with cardiologist (Dr. Saunders Revel) and Notes from prior ED visits      *Joseph Barton was evaluated in Emergency Department on 11/26/2018 for the symptoms  described in the history of present illness. He was evaluated in the context of the global COVID-19 pandemic, which necessitated consideration that the patient might be at risk for infection with the SARS-CoV-2 virus that causes COVID-19. Institutional protocols and algorithms that pertain to the evaluation of patients at risk for COVID-19 are in a state of rapid change based on information released by regulatory bodies including the CDC and federal and state organizations. These policies and algorithms were followed during the patient's care in the ED.  Some ED evaluations and interventions may be delayed as a result of limited staffing during the pandemic.*  Differential diagnosis includes, but is not limited to, acute on chronic lung disease, healthcare associated pneumonia, PE, ACS, pneumothorax, CHF exacerbation, COPD exacerbation.  I was able to review the medical record just before the patient arrived.  Last month when he arrived there was a similar issue where a STEMI was called in the field and canceled before he arrived.  Similarly, I saw the patient and interviewed him briefly upon his arrival and reviewed his EKG.  Although he does have a left bundle branch block and atrial fibrillation, it is at least roughly consistent with some of his prior EKGs, and he is having no chest pain and his shortness of breath has improved on the nonrebreather.  He is already on warfarin and he would not be a good candidate for the Cath Lab even if this was a STEMI.  I called off the STEMI and will touch base by phone with Dr. Saunders Revel.  I have a low suspicion for COVID-19 but we are sending off a swab anticipating his admission.  Currently he is on a nonrebreather and satting in the upper 90s but I have asked respiratory therapy Merry Proud) to put him on 4 L of oxygen by high flow nasal cannula to see how well he tolerates it.  Lab work and chest  x-ray are pending.  Low suspicion for infection.  I reviewed his prior H&P and discharge summary and saw that he was started on a prednisone taper starting at 40 mg.  Given his age I do not want to give an excessive dose of steroids but it sounds as if this may be helpful for his lung injury, so given the conversion of prednisone to Solu-Medrol, I am giving him a dose of 60 mg of Solu-Medrol IV.  I am holding off on any additional treatment at this time until more information is available.  The patient agrees with the plan.  Of note, I also reviewed in the medical record that he was DNR during his last hospitalization last month.  With multiple ED nurse witnesses, I reviewed this information with the patient in detail and asking him if he still wants to be DNR, and specifically clarified that if he were to stop breathing he would not want to be on a ventilator and if his heart were to stop he would not want chest compressions.  He confirmed that this is the case.  I explained to him we will continue to do everything we can but will stop short of chest compressions and intubation.  He confirmed this plan again.   Clinical Course as of Nov 27 706  Wed Nov 27, 2018  0604 Spoke by phone with Dr. Saunders Revel.  Discussed case in detail.  He agrees with my decision based on the information available to him and agreed this patient would not a be a good canidate for the cath lab  even if this was more clearly an issue of ACS.  We will continue to treat and monitor and anticipate admission for acute on chronic respiratory failure with hypoxemia.   [CF]  2094 CBC generally unremarkable   [CF]  0643 No evidence of acute infection on urinalysis.  Urinalysis, Complete w Microscopic(!) [CF]  [id number] The patient is noted to have a lactate>4. With the current information available to me, I don't think the patient is in septic shock. The lactate>4, is related to respiratory distress/ respiratory failure   Lactic Acid, Venous(!!):  4.7 [CF]  0646 I am giving a small fluid bolus but reviewing his medical record indicates that he does have a significantly reduced ejection fraction.  I do not want to confuse the situation and worsen his respiratory status by floating him.  I am giving him a small fluid bolus of 250 mL.   [CF]  0703 Likely related to acute on chronic respiratory failure  Troponin I(!!): 0.03 [CF]  7096 Transferring ED care to Dr. Joni Fears to follow up results and admit.   [CF]    Clinical Course User Index [CF] Hinda Kehr, MD     ____________________________________________  FINAL CLINICAL IMPRESSION(S) / ED DIAGNOSES  Final diagnoses:  Acute on chronic respiratory failure with hypoxia (HCC)  Amiodarone pulmonary toxicity  Chronic atrial fibrillation  Elevated lactic acid level  Elevated troponin I level     MEDICATIONS GIVEN DURING THIS VISIT:  Medications  methylPREDNISolone sodium succinate (SOLU-MEDROL) 125 mg/2 mL injection 60 mg (60 mg Intravenous Given 11/07/2018 0617)  sodium chloride 0.9 % bolus 250 mL (250 mLs Intravenous Bolus 11/17/2018 0655)     ED Discharge Orders    None       Note:  This document was prepared using Dragon voice recognition software and may include unintentional dictation errors.   Hinda Kehr, MD 11/10/2018 [phone]

## 2018-11-27 NOTE — Consult Note (Addendum)
Pulmonary Medicine          Date: 11/25/2018,   MRN# [id number] Joseph Barton [birth year]     AdmissionWeight: 81.6 kg                 CurrentWeight: 77.6 kg      CHIEF COMPLAINT:   Recurrent pneumonia    HISTORY OF PRESENT ILLNESS   This is a pleasant 85 year old male with a history of systolic CHF with EF 20 to 25%, atrial fibrillation on Coumadin, CAD AAA with repair in 2009, came into the hospital with possible pneumonia as per home health worker.   He was in the hospital 2 months previously with similar symptoms however states that he initially improved to baseline, and was again seen in April 2020 for pneumonia with upper lobe atypical interstitial infiltrates subsequently diagnosed with amiodarone pulmonary toxicity and placed on prednisone therapy.   Patient states that since starting prednisone he has been improving at home and has been using oxygen as indicated, able to ambulate without dyspnea and has been able to wean oxygen to 2 L nasal cannula and in fact states that he at times takes off oxygen completely.  Patient states that he has had multiple health workers coming to help him and his wife due to multiple comorbid conditions and someone mentioned that they are concerned about him developing pneumonia and had called EMS to bring him into the hospital.  In the ED he was found to have bilateral infiltrates with increased oxygen requirement and was hospitalized for possible pneumonia.  On admission blood work reveals a pro calcitonin that is normal, however he did have elevation lactate as well as mild leukocytosis with left shift and a slight elevation of BNP at 465.  Patient states that he has not had sick contacts denies having fevers denies having chest discomfort cough or shortness of breath.  PAST MEDICAL HISTORY   Past Medical History:  Diagnosis Date  . Abdominal aortic aneurysm ([place]) 01-2004   4.7 x 4.7 cm.  Marland Kitchen CAD (coronary artery disease) 01/25/2004   a.  01/2004 Ant MI/DES to LAD;  b. 10/2011 Neg MV.  . Chronic systolic heart failure ([place])    a. 08/2016 Echo: EF 35%, nl RV fxn, mod to sev MR, mild to mod TR, mod biatrial enlargement.  . Hyperlipidemia   . Hypertension   . Ischemic cardiomyopathy    a. 08/2016 Echo: EF 35%.  . Moderate to Severe Mitral regurgitation    a. 08/2016 Echo: Mod-sev MR.  Marland Kitchen Permanent atrial fibrillation    a. Chronic coumadin (CHA2DS2VASc = 5).  . Pulmonary nodules    Noted on abdominal CT  . Statin intolerance      SURGICAL HISTORY   Past Surgical History:  Procedure Laterality Date  . ABDOMINAL AORTIC ANEURYSM REPAIR     12/02/2007 [facility]  . ABDOMINAL AORTIC ANEURYSM REPAIR  2006   [facility]   . CARDIAC CATHETERIZATION  2009   [facility]  . CORONARY ANGIOPLASTY WITH STENT PLACEMENT  2005   Cypher stent LAD   . Cypher stents to LAD     01/25/2004  . SKIN SURGERY  2016   [facility] skin cancer     FAMILY HISTORY   Family History  Problem Relation Age of Onset  . Heart attack Father 41       Died w/ Heart attack  . Diabetes Father   . Heart attack Brother   . Heart disease  Brother   . Diabetes Sister   . Heart disease Sister      SOCIAL HISTORY   Social History   Tobacco Use  . Smoking status: Former Smoker    Last attempt to quit: 12/01/1987    Years since quitting: 31.0  . Smokeless tobacco: Never Used  Substance Use Topics  . Alcohol use: No  . Drug use: No     MEDICATIONS    Home Medication:    Current Medication:  Current Facility-Administered Medications:  .  albuterol (PROVENTIL) (2.5 MG/3ML) 0.083% nebulizer solution 2.5 mg, 2.5 mg, Nebulization, Q6H PRN, Ouma, Bing Neighbors, NP .  benzonatate (TESSALON) capsule 100 mg, 100 mg, Oral, TID PRN, Lang Snow, NP .  bisacodyl (DULCOLAX) EC tablet 5 mg, 5 mg, Oral, Daily PRN, Lang Snow, NP .  digoxin (LANOXIN) tablet 0.0625 mg, 0.0625 mg, Oral, Daily, Ouma, Bing Neighbors, NP, 0.0625 mg at 11/25/2018  1414 .  docusate sodium (COLACE) capsule 200 mg, 200 mg, Oral, BID, Ouma, Bing Neighbors, NP, 200 mg at 11/19/2018 1351 .  fluticasone (FLONASE) 50 MCG/ACT nasal spray 1 spray, 1 spray, Each Nare, Daily, Ouma, Bing Neighbors, NP, 1 spray at 11/12/2018 1415 .  furosemide (LASIX) injection 20 mg, 20 mg, Intravenous, BID, Manuella Ghazi, Vipul, MD .  ipratropium-albuterol (DUONEB) 0.5-2.5 (3) MG/3ML nebulizer solution 3 mL, 3 mL, Nebulization, Q6H, Ouma, Bing Neighbors, NP, 3 mL at 11/19/2018 1312 .  methylPREDNISolone sodium succinate (SOLU-MEDROL) 125 mg/2 mL injection 60 mg, 60 mg, Intravenous, Q6H, Manuella Ghazi, Vipul, MD, 60 mg at 11/04/2018 1352 .  multivitamin with minerals tablet 1 tablet, 1 tablet, Oral, Daily, Ouma, Bing Neighbors, NP, 1 tablet at 11/19/2018 1351 .  pantoprazole (PROTONIX) EC tablet 40 mg, 40 mg, Oral, Daily, Ouma, Bing Neighbors, NP, 40 mg at 11/07/2018 1351 .  tamsulosin (FLOMAX) capsule 0.4 mg, 0.4 mg, Oral, Daily, Ouma, Bing Neighbors, NP, 0.4 mg at 11/10/2018 1351 .  traZODone (DESYREL) tablet 50 mg, 50 mg, Oral, QHS PRN, Ouma, Bing Neighbors, NP .  warfarin (COUMADIN) tablet 2 mg, 2 mg, Oral, ONCE-1800, Shanlever, Pierce Crane, RPH .  Warfarin - Pharmacist Dosing Inpatient, , Does not apply, q1800, Shanlever, Pierce Crane, RPH    ALLERGIES   Atorvastatin and Simvastatin     REVIEW OF SYSTEMS    Review of Systems:  Gen:  Denies  fever, sweats, chills weigh loss  HEENT: Denies blurred vision, double vision, ear pain, eye pain, hearing loss, nose bleeds, sore throat Cardiac:  No dizziness, chest pain or heaviness, chest tightness,edema Resp:   Improved SOB   Gi: Denies swallowing difficulty, stomach pain, nausea or vomiting, diarrhea, constipation, bowel incontinence Gu:  Denies bladder incontinence, burning urine Ext:   Denies Joint pain, stiffness or swelling Skin: Denies  skin rash, easy bruising or bleeding or hives Endoc:  Denies polyuria, polydipsia , polyphagia or  weight change Psych:   Denies depression, insomnia or hallucinations   Other:  All other systems negative   VS: BP 105/74   Pulse 71   Temp 98.2 F (36.8 C) (Oral)   Resp 20   Ht 6\' 2"  (1.88 m)   Wt 77.6 kg   SpO2 98%   BMI 21.96 kg/m      PHYSICAL EXAM    GENERAL:NAD, no fevers, chills, no weakness no fatigue HEAD: Normocephalic, atraumatic.  EYES: Pupils equal, round, reactive to light. Extraocular muscles intact. No scleral icterus.  MOUTH: Moist mucosal membrane. Dentition intact. No abscess noted.  EAR,  NOSE, THROAT: Clear without exudates. No external lesions.  NECK: Supple. No thyromegaly. No nodules. No JVD.  PULMONARY: Diffuse velcro crepitations  CARDIOVASCULAR: S1 and S2. Regular rate and rhythm. No murmurs, rubs, or gallops. No edema. Pedal pulses 2+ bilaterally.  GASTROINTESTINAL: Soft, nontender, nondistended. No masses. Positive bowel sounds. No hepatosplenomegaly.  MUSCULOSKELETAL: No swelling, clubbing, or edema. Range of motion full in all extremities.  NEUROLOGIC: Cranial nerves II through XII are intact. No gross focal neurological deficits. Sensation intact. Reflexes intact.  SKIN: No ulceration, lesions, rashes, or cyanosis. Skin warm and dry. Turgor intact.  PSYCHIATRIC: Mood, affect within normal limits. The patient is awake, alert and oriented x 3. Insight, judgment intact.       IMAGING    Dg Chest Portable 1 View  Result Date: 12/01/2018 CLINICAL DATA:  Shortness of breath with chronic lung injury EXAM: PORTABLE CHEST 1 VIEW COMPARISON:  10/31/2018 FINDINGS: Compared with priors there is chronic lung disease with superimposed acute airspace disease that is stable in extent. Amiodarone toxicity was noted per chart. No visible effusion or pneumothorax. Normal heart size. IMPRESSION: Acute on chronic airspace disease with unchanged opacity compared to 10/31/2018. Note that superimposed pneumonia or failure could easily be obscured by the extensive  baseline opacity. Electronically Signed   By: Monte Fantasia M.D.   On: 11/02/2018 06:56   Dg Chest Port 1 View  Result Date: 10/31/2018 CLINICAL DATA:  Pneumonia. EXAM: PORTABLE CHEST 1 VIEW COMPARISON:  10/29/2018 FINDINGS: Stable mild cardiac enlargement. Severe bilateral pneumonia again noted superimposed on chronic lung disease. Density of peripheral consolidation in the right upper lobe appears slightly improved. Other areas of airspace disease bilaterally appear stable. Lung volumes are stable. No evidence of pleural fluid or pneumothorax. IMPRESSION: Relatively stable severe bilateral pneumonia with slightly improved density of consolidation in the peripheral right upper lobe. Electronically Signed   By: Aletta Edouard M.D.   On: 10/31/2018 08:21   Dg Chest Port 1 View  Result Date: 10/29/2018 CLINICAL DATA:  Shortness of breath. EXAM: PORTABLE CHEST 1 VIEW COMPARISON:  Radiograph of October 26, 2018. FINDINGS: Stable cardiomediastinal silhouette. Atherosclerosis of thoracic aorta is noted. No pneumothorax or pleural effusion is noted. Stable bilateral diffuse airspace and interstitial densities are noted throughout both lungs concerning for atypical infection or edema. Bony thorax unremarkable. IMPRESSION: Stable bilateral lung opacities as described above. Aortic Atherosclerosis (ICD10-I70.0). Electronically Signed   By: Marijo Conception M.D.   On: 10/29/2018 07:56      ASSESSMENT/PLAN     Acute on chronic hypoxemic respiratory failure          -due to amiodarone induced pulmonary toxicity          -s/p IV solumedrol           - will switch to prednisone 40 mg            - starting Bactrim SS q12 for PCP ppx            - please wean O2 as tolerated            - chest physiotherapy            - IS at bedside- able to inhale to 1250cc sp          - home with O2 at least for short term           - pulmonary clinic follow up will set up post d/c           -  no need for albuterol nebs  or DuoNEbs patient goes into Atrial fibrillation    Thank you for allowing me to participate in the care of this patient.    Patient/Family are satisfied with care plan and all questions have been answered.  This document was prepared using Dragon voice recognition software and may include unintentional dictation errors.     Ottie Glazier, M.D.  Division of [place]

## 2018-11-27 NOTE — ED Triage Notes (Signed)
Pt to ED via EMS from home with c/o sob. Per ems pt was in mid 70's on chronic 2L upon arrival. Pt placed on NRB and arrives at 96%. Pt was given 324 mg Asprin with ems. On arrival pt denies sob or pain. Pts breathing is shallow and tachypnic.

## 2018-11-27 NOTE — ED Notes (Signed)
Attempted to call to see if RN was ready for patient, waited on hold for 7:13. Will call back again shortly.

## 2018-11-27 NOTE — ED Notes (Signed)
Date and time results received: 11/14/2018 0645 (use smartphrase ".now" to insert current time)  Test: lactic Critical Value: 4.7  Name of Provider Notified: forbach  Orders Received? Or Actions Taken?: Orders Received - See Orders for details

## 2018-11-27 NOTE — ED Notes (Signed)
Pt resting with eyes closed, rise and fall of chest noted, bed locked and low, call bell in reach.

## 2018-11-27 NOTE — ED Notes (Addendum)
respiratory at bedside to place pt on 4L HFNC

## 2018-11-27 NOTE — Telephone Encounter (Signed)
Patient has been admitted to the hospital

## 2018-11-27 NOTE — ED Notes (Signed)
ED TO INPATIENT HANDOFF REPORT  ED Nurse Name and Phone #: Kevyn Boquet [phone]  S Name/Age/Gender Joseph Barton 85 y.o. male Room/Bed: ED11A/ED11A  Code Status   Code Status: DNR  Home/SNF/Other Rehab Patient oriented to: self, place, time and situation Is this baseline? Yes   Triage Complete: Triage complete  Chief Complaint [facility] - Code Stemi  Triage Note Pt to ED via EMS from home with c/o sob. Per ems pt was in mid 70's on chronic 2L upon arrival. Pt placed on NRB and arrives at 96%. Pt was given 324 mg Asprin with ems. On arrival pt denies sob or pain. Pts breathing is shallow and tachypnic.   Allergies Allergies  Allergen Reactions  . Atorvastatin   . Simvastatin     Level of Care/Admitting Diagnosis ED Disposition    ED Disposition Condition Comment   Admit  The patient appears reasonably stabilized for admission considering the current resources, flow, and capabilities available in the ED at this time, and I doubt any other [facility] requiring further screening and/or treatment in the ED prior to admission is  present.       B Medical/Surgery History Past Medical History:  Diagnosis Date  . Abdominal aortic aneurysm ([place]) 01-2004   4.7 x 4.7 cm.  Marland Kitchen CAD (coronary artery disease) 01/25/2004   a. 01/2004 Ant MI/DES to LAD;  b. 10/2011 Neg MV.  . Chronic systolic heart failure ([place])    a. 08/2016 Echo: EF 35%, nl RV fxn, mod to sev MR, mild to mod TR, mod biatrial enlargement.  . Hyperlipidemia   . Hypertension   . Ischemic cardiomyopathy    a. 08/2016 Echo: EF 35%.  . Moderate to Severe Mitral regurgitation    a. 08/2016 Echo: Mod-sev MR.  Marland Kitchen Permanent atrial fibrillation    a. Chronic coumadin (CHA2DS2VASc = 5).  . Pulmonary nodules    Noted on abdominal CT  . Statin intolerance    Past Surgical History:  Procedure Laterality Date  . ABDOMINAL AORTIC ANEURYSM REPAIR     12/02/2007 [facility]  . ABDOMINAL AORTIC ANEURYSM REPAIR  2006   [facility]   . CARDIAC  CATHETERIZATION  2009   [facility]  . CORONARY ANGIOPLASTY WITH STENT PLACEMENT  2005   Cypher stent LAD   . Cypher stents to LAD     01/25/2004  . SKIN SURGERY  2016   [facility] skin cancer     A IV Location/Drains/Wounds Patient Lines/Drains/Airways Status   Active Line/Drains/Airways    Name:   Placement date:   Placement time:   Site:   Days:   Peripheral IV 11/03/2018 Left Forearm   11/14/2018    0604    Forearm   less than 1   Peripheral IV 11/03/2018 Right Hand   11/14/2018    0604    Hand   less than 1          Intake/Output Last 24 hours No intake or output data in the 24 hours ending 11/15/2018 0920  Labs/Imaging Results for orders placed or performed during the hospital encounter of 11/26/2018 (from the past 1 hour(s))  SARS Coronavirus 2 (CEPHEID- Performed in [place] hospital lab), Hosp Order     Status: None   Collection Time: 12/01/2018  6:03 AM  Result Value Ref Range   SARS Coronavirus 2 NEGATIVE NEGATIVE    Comment: (NOTE) If result is NEGATIVE SARS-CoV-2 target nucleic acids are NOT DETECTED. The SARS-CoV-2 RNA is generally detectable in  upper and lower  respiratory specimens during the acute phase of infection. The lowest  concentration of SARS-CoV-2 viral copies this assay can detect is 250  copies / mL. A negative result does not preclude SARS-CoV-2 infection  and should not be used as the sole basis for treatment or other  patient management decisions.  A negative result may occur with  improper specimen collection / handling, submission of specimen other  than nasopharyngeal swab, presence of viral mutation(s) within the  areas targeted by this assay, and inadequate number of viral copies  (<250 copies / mL). A negative result must be combined with clinical  observations, patient history, and epidemiological information. If result is POSITIVE SARS-CoV-2 target nucleic acids are DETECTED. The SARS-CoV-2 RNA is generally detectable in upper and lower  respiratory  specimens dur ing the acute phase of infection.  Positive  results are indicative of active infection with SARS-CoV-2.  Clinical  correlation with patient history and other diagnostic information is  necessary to determine patient infection status.  Positive results do  not rule out bacterial infection or co-infection with other viruses. If result is PRESUMPTIVE POSTIVE SARS-CoV-2 nucleic acids MAY BE PRESENT.   A presumptive positive result was obtained on the submitted specimen  and confirmed on repeat testing.  While 2019 novel coronavirus  (SARS-CoV-2) nucleic acids may be present in the submitted sample  additional confirmatory testing may be necessary for epidemiological  and / or clinical management purposes  to differentiate between  SARS-CoV-2 and other Sarbecovirus currently known to infect humans.  If clinically indicated additional testing with an alternate test  methodology [phone]) is advised. The SARS-CoV-2 RNA is generally  detectable in upper and lower respiratory sp ecimens during the acute  phase of infection. The expected result is Negative. Fact Sheet for Patients:  StrictlyIdeas.no Fact Sheet for Healthcare Providers: BankingDealers.co.za This test is not yet approved or cleared by the [place] FDA and has been authorized for detection and/or diagnosis of SARS-CoV-2 by FDA under an Emergency Use Authorization (EUA).  This EUA will remain in effect (meaning this test can be used) for the duration of the COVID-19 declaration under Section 564(b)(1) of the Act, 21 U.S.C. section 360bbb-3(b)(1), unless the authorization is terminated or revoked sooner. Performed at [facility], [address] [place], [place] [address]   Comprehensive metabolic panel     Status: Abnormal   Collection Time: 11/22/2018  6:03 AM  Result Value Ref Range   Sodium 137 135 - 145 mmol/L   Potassium 4.4 3.5 - 5.1 mmol/L    Chloride 99 98 - 111 mmol/L   CO2 26 22 - 32 mmol/L   Glucose, Bld 106 (H) 70 - 99 mg/dL   BUN 25 (H) 8 - 23 mg/dL   Creatinine, Ser 1.35 (H) 0.61 - 1.24 mg/dL   Calcium 8.5 (L) 8.9 - 10.3 mg/dL   Total Protein 7.0 6.5 - 8.1 g/dL   Albumin 2.9 (L) 3.5 - 5.0 g/dL   AST 23 15 - 41 U/L   ALT 23 0 - 44 U/L   Alkaline Phosphatase 66 38 - 126 U/L   Total Bilirubin 0.7 0.3 - 1.2 mg/dL   GFR calc non Af Amer 46 (L) >60 mL/min   GFR calc Af Amer 53 (L) >60 mL/min   Anion gap 12 5 - 15    Comment: Performed at [facility], [address], [place], [place] [address]  Brain natriuretic peptide  Status: Abnormal   Collection Time: 11/16/2018  6:03 AM  Result Value Ref Range   B Natriuretic Peptide 465.0 (H) 0.0 - 100.0 pg/mL    Comment: Performed at [facility], [address] [place], [place] [address]  Troponin I - Once     Status: Abnormal   Collection Time: 11/24/2018  6:03 AM  Result Value Ref Range   Troponin I 0.03 (HH) <0.03 ng/mL    Comment: CRITICAL RESULT CALLED TO, READ BACK BY AND VERIFIED WITH GRACIE WINEMAN AT 0700 ON 11/07/2018 [place]. Performed at [facility], [address] [place], [place] [address]   CBC with Differential     Status: Abnormal   Collection Time: 11/07/2018  6:03 AM  Result Value Ref Range   WBC 11.5 (H) 4.0 - 10.5 K/uL   RBC 4.43 4.22 - 5.81 MIL/uL   Hemoglobin 13.2 13.0 - 17.0 g/dL   HCT 41.1 39.0 - 52.0 %   MCV 92.8 80.0 - 100.0 fL   MCH 29.8 26.0 - 34.0 pg   MCHC 32.1 30.0 - 36.0 g/dL   RDW 15.7 (H) 11.5 - 15.5 %   Platelets 160 150 - 400 K/uL   nRBC 0.0 0.0 - 0.2 %   Neutrophils Relative % 83 %   Neutro Abs 9.6 (H) 1.7 - 7.7 K/uL   Lymphocytes Relative 9 %   Lymphs Abs 1.0 0.7 - 4.0 K/uL   Monocytes Relative 6 %   Monocytes Absolute 0.7 0.1 - 1.0 K/uL   Eosinophils Relative 0 %   Eosinophils Absolute 0.0 0.0 - 0.5 K/uL   Basophils Relative 0 %   Basophils Absolute 0.0 0.0 - 0.1 K/uL   Immature Granulocytes 2 %    Abs Immature Granulocytes 0.17 (H) 0.00 - 0.07 K/uL    Comment: Performed at [facility], [address] [place], [place] [address]  Lactic acid, plasma     Status: Abnormal   Collection Time: 11/30/2018  6:03 AM  Result Value Ref Range   Lactic Acid, Venous 4.7 (HH) 0.5 - 1.9 mmol/L    Comment: CRITICAL RESULT CALLED TO, READ BACK BY AND VERIFIED WITH  Terri Piedra [facility] AT 4680 11/15/2018 SDR Performed at [facility], [address] [place], [place] [address]   Urinalysis, Complete w Microscopic     Status: Abnormal   Collection Time: 11/03/2018  6:03 AM  Result Value Ref Range   Color, Urine YELLOW (A) YELLOW   APPearance CLEAR (A) CLEAR   Specific Gravity, Urine 1.016 1.005 - 1.030   pH 7.0 5.0 - 8.0   Glucose, UA NEGATIVE NEGATIVE mg/dL   Hgb urine dipstick NEGATIVE NEGATIVE   Bilirubin Urine NEGATIVE NEGATIVE   Ketones, ur NEGATIVE NEGATIVE mg/dL   Protein, ur NEGATIVE NEGATIVE mg/dL   Nitrite NEGATIVE NEGATIVE   Leukocytes,Ua NEGATIVE NEGATIVE   RBC / HPF 0-5 0 - 5 RBC/hpf   WBC, UA 0-5 0 - 5 WBC/hpf   Bacteria, UA NONE SEEN NONE SEEN   Squamous Epithelial / LPF 0-5 0 - 5    Comment: Performed at [facility], [address] [place], [place] [address]  Procalcitonin     Status: None   Collection Time: 11/09/2018  6:03 AM  Result Value Ref Range   Procalcitonin <0.10 ng/mL    Comment:        Interpretation: PCT (Procalcitonin) <= 0.5 ng/mL: Systemic infection (sepsis) is not likely. Local bacterial infection is possible. (NOTE)  Sepsis PCT Algorithm           Lower Respiratory Tract                                      Infection PCT Algorithm    ----------------------------     ----------------------------         PCT < 0.25 ng/mL                PCT < 0.10 ng/mL         Strongly encourage             Strongly discourage   discontinuation of antibiotics    initiation of antibiotics    ----------------------------      -----------------------------       PCT 0.25 - 0.50 ng/mL            PCT 0.10 - 0.25 ng/mL               OR       >80% decrease in PCT            Discourage initiation of                                            antibiotics      Encourage discontinuation           of antibiotics    ----------------------------     -----------------------------         PCT >= 0.50 ng/mL              PCT 0.26 - 0.50 ng/mL               AND        <80% decrease in PCT             Encourage initiation of                                             antibiotics       Encourage continuation           of antibiotics    ----------------------------     -----------------------------        PCT >= 0.50 ng/mL                  PCT > 0.50 ng/mL               AND         increase in PCT                  Strongly encourage                                      initiation of antibiotics    Strongly encourage escalation           of antibiotics                                     -----------------------------  PCT <= 0.25 ng/mL                                                 OR                                        > 80% decrease in PCT                                     Discontinue / Do not initiate                                             antibiotics Performed at [facility], [address] [place], [place] [address]   Magnesium     Status: None   Collection Time: 11/24/2018  6:03 AM  Result Value Ref Range   Magnesium 1.8 1.7 - 2.4 mg/dL    Comment: Performed at [facility], [address] [place], [place] [address]  Digoxin level     Status: Abnormal   Collection Time: 11/19/2018  6:54 AM  Result Value Ref Range   Digoxin Level <0.2 (L) 0.8 - 2.0 ng/mL    Comment: RESULT CONFIRMED BY MANUAL DILUTION...[facility] Performed at [facility], [address] [place], [place] [address]   Protime-INR     Status: Abnormal   Collection Time:  11/26/2018  6:54 AM  Result Value Ref Range   Prothrombin Time 16.8 (H) 11.4 - 15.2 seconds   INR 1.4 (H) 0.8 - 1.2    Comment: (NOTE) INR goal varies based on device and disease states. Performed at [facility], [address] [place], [place] [address]    Dg Chest Portable 1 View  Result Date: 11/13/2018 CLINICAL DATA:  Shortness of breath with chronic lung injury EXAM: PORTABLE CHEST 1 VIEW COMPARISON:  10/31/2018 FINDINGS: Compared with priors there is chronic lung disease with superimposed acute airspace disease that is stable in extent. Amiodarone toxicity was noted per chart. No visible effusion or pneumothorax. Normal heart size. IMPRESSION: Acute on chronic airspace disease with unchanged opacity compared to 10/31/2018. Note that superimposed pneumonia or failure could easily be obscured by the extensive baseline opacity. Electronically Signed   By: Monte Fantasia M.D.   On: 11/13/2018 06:56    Pending Labs Unresulted Labs (From admission, onward)    Start     Ordered   11/10/2018 0910  Blood gas, venous  ONCE - STAT,   STAT     11/28/2018 0909   12/01/2018 0600  Lactic acid, plasma  Now then every 2 hours,   STAT     11/28/2018 0600   11/30/2018 0600  Urine culture  Add-on,   AD     11/26/2018 0600          Vitals/Pain Today's Vitals   11/21/2018 0554 11/03/2018 0555 11/20/2018 0600 11/04/2018 0630  BP:  91/61 (!) 101/55 102/62  Pulse:   (!) 53 97  Resp:      Temp:  98.4 F (36.9 C)    TempSrc:  Axillary    SpO2:  96% 97% 99%  Weight:      Height:      PainSc: 0-No pain       Isolation Precautions Droplet and Contact precautions  Medications Medications  methylPREDNISolone sodium succinate (SOLU-MEDROL) 125 mg/2 mL injection 60 mg (60 mg Intravenous Given 11/10/2018 0617)  sodium chloride 0.9 % bolus 250 mL (250 mLs Intravenous Bolus 11/21/2018 0655)    Mobility walks with device Moderate fall risk   Focused Assessments Pulmonary Assessment Handoff:  Lung  sounds:   O2 Device: High Flow Nasal Cannula O2 Flow Rate (L/min): 5 L/min      R Recommendations: See Admitting Provider Note  Report given to:   Additional Notes:

## 2018-11-27 NOTE — ED Notes (Signed)
Called to tell floor we were bringing pt.

## 2018-11-27 NOTE — Progress Notes (Signed)
PHARMACIST - PHYSICIAN ORDER COMMUNICATION  CONCERNING: P&T Medication Policy on Herbal Medications  DESCRIPTION:  This patient's order for:  Bioflex  has been noted.  This product(s) is classified as an "herbal" or natural product. Due to a lack of definitive safety studies or FDA approval, nonstandard manufacturing practices, plus the potential risk of unknown drug-drug interactions while on inpatient medications, the Pharmacy and Therapeutics Committee does not permit the use of "herbal" or natural products of this type within [facility].   ACTION TAKEN: The pharmacy department is unable to verify this order at this time.  Please reevaluate patient's clinical condition at discharge and address if the herbal or natural product(s) should be resumed at that time.

## 2018-11-28 ENCOUNTER — Inpatient Hospital Stay: Payer: Medicare Other

## 2018-11-28 LAB — CBC WITH DIFFERENTIAL/PLATELET
Abs Immature Granulocytes: 0.07 10*3/uL (ref 0.00–0.07)
Basophils Absolute: 0 10*3/uL (ref 0.0–0.1)
Basophils Relative: 0 %
Eosinophils Absolute: 0 10*3/uL (ref 0.0–0.5)
Eosinophils Relative: 0 %
HCT: 35.9 % — ABNORMAL LOW (ref 39.0–52.0)
Hemoglobin: 11.6 g/dL — ABNORMAL LOW (ref 13.0–17.0)
Immature Granulocytes: 1 %
Lymphocytes Relative: 6 %
Lymphs Abs: 0.6 10*3/uL — ABNORMAL LOW (ref 0.7–4.0)
MCH: 30.1 pg (ref 26.0–34.0)
MCHC: 32.3 g/dL (ref 30.0–36.0)
MCV: 93 fL (ref 80.0–100.0)
Monocytes Absolute: 0.3 10*3/uL (ref 0.1–1.0)
Monocytes Relative: 3 %
Neutro Abs: 8.7 10*3/uL — ABNORMAL HIGH (ref 1.7–7.7)
Neutrophils Relative %: 90 %
Platelets: 151 10*3/uL (ref 150–400)
RBC: 3.86 MIL/uL — ABNORMAL LOW (ref 4.22–5.81)
RDW: 15.9 % — ABNORMAL HIGH (ref 11.5–15.5)
WBC: 9.7 10*3/uL (ref 4.0–10.5)
nRBC: 0 % (ref 0.0–0.2)

## 2018-11-28 LAB — BASIC METABOLIC PANEL
Anion gap: 9 (ref 5–15)
BUN: 28 mg/dL — ABNORMAL HIGH (ref 8–23)
CO2: 27 mmol/L (ref 22–32)
Calcium: 8.3 mg/dL — ABNORMAL LOW (ref 8.9–10.3)
Chloride: 99 mmol/L (ref 98–111)
Creatinine, Ser: 1.02 mg/dL (ref 0.61–1.24)
GFR calc Af Amer: >60 mL/min (ref >60)
GFR calc non Af Amer: >60 mL/min (ref >60)
Glucose, Bld: 153 mg/dL — ABNORMAL HIGH (ref 70–99)
Potassium: 4.7 mmol/L (ref 3.5–5.1)
Sodium: 135 mmol/L (ref 135–145)

## 2018-11-28 LAB — MAGNESIUM: Magnesium: 2 mg/dL (ref 1.7–2.4)

## 2018-11-28 LAB — BRAIN NATRIURETIC PEPTIDE: B Natriuretic Peptide: 838 pg/mL — ABNORMAL HIGH (ref 0.0–100.0)

## 2018-11-28 LAB — PROTIME-INR
INR: 1.3 — ABNORMAL HIGH (ref 0.8–1.2)
Prothrombin Time: 16.3 seconds — ABNORMAL HIGH (ref 11.4–15.2)

## 2018-11-28 LAB — URINE CULTURE: Culture: NO GROWTH

## 2018-11-28 LAB — LACTIC ACID, PLASMA: Lactic Acid, Venous: 2.7 mmol/L (ref 0.5–1.9)

## 2018-11-28 MED ORDER — METOPROLOL SUCCINATE ER 25 MG PO TB24
25.0000 mg | ORAL_TABLET | Freq: Every day | ORAL | Status: DC
  Administered 2018-11-28: 25 mg via ORAL
  Filled 2018-11-28: qty 1

## 2018-11-28 MED ORDER — SODIUM CHLORIDE 0.9 % IV BOLUS
250.0000 mL | Freq: Once | INTRAVENOUS | Status: AC
  Administered 2018-11-28: 250 mL via INTRAVENOUS

## 2018-11-28 MED ORDER — WARFARIN SODIUM 2 MG PO TABS
2.0000 mg | ORAL_TABLET | Freq: Once | ORAL | Status: AC
  Administered 2018-11-28: 2 mg via ORAL
  Filled 2018-11-28: qty 1

## 2018-11-28 NOTE — Progress Notes (Signed)
At 1445, PT found pt on 2 L/min via [place] with SpO2 79-81%.  Bethel Born, RN placed pt on NRB and called RT.  SpO2 increased to 84%.    At 1520, RT weaned pt to 6 L/min via  with SpO2 90-92%. RR 22-24.  At 1535, update communicated to pulmonologist and hospitalist.  CXR ordered by hospitalist.

## 2018-11-28 NOTE — Progress Notes (Signed)
PT Cancellation Note  Patient Details Name: Joseph Barton MRN: [id number] DOB: Jul 06, 1926   Cancelled Treatment:     PT came into patient room, he was sitting contently in recliner on 2L of O2 from wall. Noted O2 sats at 79-81%, though no other symptoms noted and patient was conversational. PT called in RN, and increased O2 to 10L, with minimal increase in SpO2 (up to 82-84%). PT will defer mobility evaluation at this time, and re-attempt when patient is more appropriate.   Royce Macadamia PT, DPT, CSCS    11/28/2018, 2:54 PM

## 2018-11-28 NOTE — Progress Notes (Signed)
[facility] NAME: Joseph Barton    MR#:  [id number]  DATE OF BIRTH:  06-Nov-1926  SUBJECTIVE:   Chief Complaint  Patient presents with  . Shortness of Breath   Patient is seen at the bedside, he appears alert and was oriented to person and place. Per nursing staff he has periods of intermittent confusion.  Noted with episodes of afib with RVR.  REVIEW OF SYSTEMS:  ROS Constitutional: Negative for chills, fever, malaise/fatigue and weight loss.  HENT: Positive for hearing loss. Negative for congestion and sore throat.   Eyes: Negative for blurred vision and double vision.  Respiratory: Positive for shortness of breath and wheezing. Negative for cough, hemoptysis and sputum production.   Cardiovascular: Negative for chest pain, palpitations, orthopnea and leg swelling.       Irregular heart rhythm  Gastrointestinal: Negative for abdominal pain, diarrhea, nausea and vomiting.  Genitourinary: Negative for dysuria and urgency.  Musculoskeletal: Negative for myalgias.  Skin: Negative for rash.  Neurological: Negative for dizziness, sensory change, speech change, focal weakness and headaches.  Psychiatric/Behavioral: Negative for depression.  DRUG ALLERGIES:   Allergies  Allergen Reactions  . Atorvastatin   . Simvastatin    VITALS:  Blood pressure (!) 117/59, pulse 63, temperature 98 F (36.7 C), resp. rate 20, height 6\' 2"  (1.88 m), weight 80.2 kg, SpO2 (!) 85 %. PHYSICAL EXAMINATION:   GENERAL:  85 y.o.-year-old patient lying in the bed with no acute distress.  EYES: Pupils equal, round, reactive to light and accommodation. No scleral icterus. Extraocular muscles intact.  HEENT: Head atraumatic, normocephalic. Oropharynx and nasopharynx clear.  NECK:  Supple, no jugular venous distention. No thyroid enlargement, no tenderness.  LUNGS: Normal breath sounds bilaterally, no wheezing, rales,rhonchi or crepitation. No use of  accessory muscles of respiration.  CARDIOVASCULAR: S1, S2 normal. Irregular, no rubs, or gallops.  ABDOMEN: Soft, nontender, nondistended. Bowel sounds present. No organomegaly or mass.  EXTREMITIES: No pedal edema, cyanosis, or clubbing.  NEUROLOGIC: Cranial nerves II through XII are intact. Muscle strength 5/5 in all extremities. Sensation intact. Gait not checked.  PSYCHIATRIC: The patient is alert and oriented x 3.  SKIN: No obvious rash, lesion, or ulcer.   DATA REVIEWED:  LABORATORY PANEL:  Male CBC Recent Labs  Lab 11/28/18 0357  WBC 9.7  HGB 11.6*  HCT 35.9*  PLT 151   ------------------------------------------------------------------------------------------------------------------ Chemistries  Recent Labs  Lab 11/29/2018 0603 11/28/18 0357  NA 137 135  K 4.4 4.7  CL 99 99  CO2 26 27  GLUCOSE 106* 153*  BUN 25* 28*  CREATININE 1.35* 1.02  CALCIUM 8.5* 8.3*  MG 1.8 2.0  AST 23  --   ALT 23  --   ALKPHOS 66  --   BILITOT 0.7  --    RADIOLOGY:  Dg Chest Portable 1 View  Result Date: 11/12/2018 CLINICAL DATA:  Shortness of breath with chronic lung injury EXAM: PORTABLE CHEST 1 VIEW COMPARISON:  10/31/2018 FINDINGS: Compared with priors there is chronic lung disease with superimposed acute airspace disease that is stable in extent. Amiodarone toxicity was noted per chart. No visible effusion or pneumothorax. Normal heart size. IMPRESSION: Acute on chronic airspace disease with unchanged opacity compared to 10/31/2018. Note that superimposed pneumonia or failure could easily be obscured by the extensive baseline opacity. Electronically Signed   By: Monte Fantasia M.D.   On: 11/05/2018 06:56   ASSESSMENT AND PLAN:   85 y.o.  male with a known history of ischemic cardiomyopathy, atrial fibrillation on Coumadin, hyperlipidemia, chronic systolic heart failure, MI, CAD status post percutaneous coronary angioplasty, AAA, amiodarone-induced lung injury, and hypertension  presenting with worsening shortness of breath.  1. Acute on chronic hypoxic respiratory failure - Hx of recent amiodarone induced lung injury also with CHF on chronic home oxygen at 2 L. - Chest x-ray shows acute on chronic airspace disease with unchanged opacity from previous finding - Lactic acid improving - SARS Coronavirus negative - Supplemental O2, goal sat 88-92%, weaning as tolerated - Start Bactrim SS q12 for PCP ppx per Pulmonary - Duonebs d/cd  Due to afib - Continue incentive spirometry - Corticosteroids: pred 40 mg - Will get PT to eval and assess continued oxygen need.  2. Acute on chronic diastolic Congestive Heart Failure-BNP remains elevated  Last Echo 10/23/18,  EF 25 to 30% - Beta-Blockade: on Metoprolol  - Diuretics:Furosemide 20 mg BID   IV diureses >1L negative per day until approach euvolemia / worsening renal function. - Continue digoxin, will monitor renal function - Low salt diet   3. Elevated troponin -Likely demand ischemia - Improved - EKG reviewed as above  4. Chronic atrial fibrillation - noted with episodes of RVR - Resume home metoprolol 25 mg XL - Coagulated with Coumadin - INR subtherapeutic - Coumadin management per pharmacy  5. Hypertension - stable - Continue to monitor  6. DVT prophylaxis -on Coumadin   All the records are reviewed and case discussed with Care Management/Social Worker. Management plans discussed with the patient, family and they are in agreement.  CODE STATUS: DNR  TOTAL TIME TAKING CARE OF THIS PATIENT: 40 minutes.   More than 50% of the time was spent in counseling/coordination of care: YES  POSSIBLE D/C IN 1 DAYS, DEPENDING ON CLINICAL CONDITION.   on 11/28/2018 at 12:47 PM  This patient was staffed with Dr. Vianne Bulls, Lise Auer who personally evaluated patient, reviewed documentation and agreed with assessment and plan of care as above.  Rufina Falco, DNP, FNP-BC [facility] Nurse  Practitioner   Between 7am to 6pm - Pager [phone]  After 6pm go to [email or web address] - password [email or web address]  [facility] [place] [facility]  Office  [phone]  CC: Primary care physician; Jerrol Banana., MD  Note: This dictation was prepared with Dragon dictation along with smaller phrase technology. Any transcriptional errors that result from this process are unintentional.

## 2018-11-28 NOTE — Progress Notes (Signed)
Lab called with a critical Lactic Acid of 2.7. Dr called new orders given, will continue to monitor.

## 2018-11-28 NOTE — Progress Notes (Signed)
Spoke with Benjamine Mola, NP (Cardiology) regarding HR being elevated up to 140's.  Pt is asymptomatic.  BP is stable.  NP stated that she would restart his metroprolol.

## 2018-11-28 NOTE — TOC Initial Note (Signed)
Transition of Care [facility]) - Initial/Assessment Note    Patient Details  Name: Joseph Barton MRN: [id number] Date of Birth: [birth year]  Transition of Care [facility]) CM/SW Contact:    Elza Rafter, RN Phone Number: 11/28/2018, 4:00 PM  Clinical Narrative:      Patient is from home with spouse.  He is independent in all ADL's.  Current with PCP-Dr. Rosanna Randy.  Obtains medications via mail order without difficulty.  He is open to [facility] for RN.  Uses 3L chronic oxygen.  Has a portable machine as well and a nebulizer.  Uses a cane and a walker as needed.  Will notify Corene Cornea when patient discharges.               Expected Discharge Plan: [place] Barriers to Discharge: Continued Medical Work up   Patient Goals and CMS Choice Patient states their goals for this hospitalization and ongoing recovery are:: return home with [facility] services.   CMS Medicare.gov Compare Post Acute Care list provided to:: Patient Choice offered to / list presented to : Patient  Expected Discharge Plan and Services Expected Discharge Plan: [place]   Discharge Planning Services: CM Consult Post Acute Care Choice: [place] arrangements for the past 2 months: [place] Arranged: RN, PT [facility] Agency: [facility]) Date [place]: 11/28/18 Time [place]: 1559 Representative spoke with at [place]: Corene Cornea  Prior Living Arrangements/Services Living arrangements for the past 2 months: [place] with:: Spouse Patient language and need for interpreter reviewed:: Yes Do you feel safe going back to the place where you live?: Yes            Criminal Activity/Legal Involvement Pertinent to Current Situation/Hospitalization: No - Comment as needed  Activities of Daily Living Home Assistive Devices/Equipment: Dentures (specify type), Eyeglasses, Wheelchair, Environmental consultant (specify type) ADL  Screening (condition at time of admission) Patient's cognitive ability adequate to safely complete daily activities?: Yes Is the patient deaf or have difficulty hearing?: No Does the patient have difficulty seeing, even when wearing glasses/contacts?: No Does the patient have difficulty concentrating, remembering, or making decisions?: No Patient able to express need for assistance with ADLs?: Yes Does the patient have difficulty dressing or bathing?: No Independently performs ADLs?: Yes (appropriate for developmental age) Does the patient have difficulty walking or climbing stairs?: No Weakness of Legs: None Weakness of Arms/Hands: None  Permission Sought/Granted Permission sought to share information with : Facility Art therapist granted to share information with : Yes, Verbal Permission Granted  Share Information with NAME: Floydene Flock           Emotional Assessment Appearance:: Appears stated age Attitude/Demeanor/Rapport: Gracious Affect (typically observed): Accepting Orientation: : Oriented to Place, Oriented to Self, Oriented to  Time, Oriented to Situation Alcohol / Substance Use: Not Applicable    Admission diagnosis:  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 Acute respiratory failure with hypoxia ([place])   . Acute on chronic systolic heart failure ([place])   . HCAP (healthcare-associated pneumonia) 10/20/2018  . Malnutrition of moderate degree 07/18/2018  .  Pneumonia 07/15/2018  . Hypotension 12/12/2017  . CHF (congestive heart failure) ([place]) 01/12/202019  . Subclinical hypothyroidism 10/06/2015  . Allergic rhinitis 09/10/2015  . Bradycardia 09/10/2015  . Failure of erection 09/10/2015  . Blood glucose elevated  09/10/2015  . BP (high blood pressure) 09/10/2015  . Neuropathy 09/10/2015  . Apnea, sleep 09/10/2015  . Cancer of skin, squamous cell 09/10/2015  . Acid reflux 04/28/2015  . Arthritis, degenerative 12/25/2014  . Cardiomyopathy, ischemic 09/27/2013  . HLD (hyperlipidemia) 09/27/2013  . Lung nodule, multiple 09/27/2013  . Drug intolerance 09/27/2013  . Ischemic cardiomyopathy 12/01/2011  . Atrial fibrillation ([place]) 12/01/2011  . Chronic systolic heart failure ([place]) 12/01/2011  . IVCD (intraventricular conduction defect) 12/01/2011  . Intraventricular block 12/01/2011  . 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 CAD S/P percutaneous coronary angioplasty 01/25/2004  . Myocardial infarction ([place]) 01/25/2004  . AAA (abdominal aortic aneurysm) ([place]) 01/01/2004   PCP:  Jerrol Banana., MD Pharmacy:   [place], [address] [place] [address] Phone: [phone] Fax: [place], [place] - [address] [place] [address] Phone: [phone] Fax: [place] #[id number] [place], [place] - [address] [place] [address] Phone: [phone] Fax: [place], [place] - [place] [address] Phone: [phone] Fax: [phone]     Social Determinants of Health (SDOH) Interventions    Readmission Risk Interventions Readmission Risk Prevention Plan 11/28/2018 10/24/2018  Transportation Screening Complete Complete  PCP or Specialist Appt within 3-5 Days - Complete  HRI or [place] - Complete  Social Work Consult for [place] Planning/Counseling - Not Complete  SW consult not completed comments - Patient feels that he does well with the support of  his wife and daughter, he is not interested in a home health social worker.  Palliative Care Screening - Not Applicable  Medication Review (RN Care Manager) Complete Complete  PCP or Specialist appointment within 3-5 days of discharge Complete -  [place] or Home Care Consult Complete -  SW Recovery Care/Counseling Consult Not Complete -  SW Consult Not Complete Comments na -  Palliative Care Screening Not Applicable -  [place] Not Applicable -  Some recent data might be hidden

## 2018-11-28 NOTE — Consult Note (Signed)
ANTICOAGULATION CONSULT NOTE - Initial Consult  Pharmacy Consult for Warfarin Indication: atrial fibrillation  Allergies  Allergen Reactions  . Atorvastatin   . Simvastatin     Patient Measurements: Height: 6\' 2"  (188 cm) Weight: 176 lb 11.2 oz (80.2 kg) IBW/kg (Calculated) : 82.2  Vital Signs: Temp: 98 F (36.7 C) (05/28 0802) Temp Source: Oral (05/28 0407) BP: 117/59 (05/28 0802) Pulse Rate: 63 (05/28 0802)  Labs: Recent Labs    11/24/2018 0603 11/03/2018 0654 11/17/2018 1248 11/28/18 0357  HGB 13.2  --   --  11.6*  HCT 41.1  --   --  35.9*  PLT 160  --   --  151  LABPROT  --  16.8*  --  16.3*  INR  --  1.4*  --  1.3*  CREATININE 1.35*  --   --  1.02  TROPONINI 0.03*  --  <0.03  --     Estimated Creatinine Clearance: 54.6 mL/min (by C-G formula based on SCr of 1.02 mg/dL).   Medical History: Past Medical History:  Diagnosis Date  . Abdominal aortic aneurysm ([place]) 01-2004   4.7 x 4.7 cm.  Marland Kitchen CAD (coronary artery disease) 01/25/2004   a. 01/2004 Ant MI/DES to LAD;  b. 10/2011 Neg MV.  . Chronic systolic heart failure ([place])    a. 08/2016 Echo: EF 35%, nl RV fxn, mod to sev MR, mild to mod TR, mod biatrial enlargement.  . Hyperlipidemia   . Hypertension   . Ischemic cardiomyopathy    a. 08/2016 Echo: EF 35%.  . Moderate to Severe Mitral regurgitation    a. 08/2016 Echo: Mod-sev MR.  Marland Kitchen Permanent atrial fibrillation    a. Chronic coumadin (CHA2DS2VASc = 5).  . Pulmonary nodules    Noted on abdominal CT  . Statin intolerance     Medications:  PTA dose 1mg  Warfarin qd (5/26 note from [facility] - primary care provider to increase dose to 2mg  qd)  Assessment: Permanent A Fib with CHA2DS2VASc score of 5.    INR 1.3 - Progressing cautiously because of drug interaction with Bactrim  Goal of Therapy:  INR 2-3 Monitor platelets by anticoagulation protocol: Yes   Plan:  Will continue newly increased dose of 2mg  daily.   Will check INR daily and CBC a minimum  of every 3 days while on warfarin.   Lu Duffel, PharmD, BCPS Clinical Pharmacist 11/28/2018 10:27 AM

## 2018-11-28 NOTE — Progress Notes (Signed)
Pulmonary Medicine          Date: 11/28/2018,   MRN# [id number] Joseph Barton 28-Jul-1926     AdmissionWeight: 81.6 kg                 CurrentWeight: 80.2 kg       SUBJECTIVE    Patient is smiling during evaluation this morning and eating breakfast, he is clinically improved.  Plan today is to wean down oxygen to 2 to 3 L nasal cannula with exertional walk.  Hopefully will be able to discharge home today with close follow-up on outpatient.   Please continue Bactrim ss BID  AND Pred 40mg  daily on d/c and I will take over in clinic next week.    PAST MEDICAL HISTORY   Past Medical History:  Diagnosis Date  . Abdominal aortic aneurysm ([place]) 01-2004   4.7 x 4.7 cm.  Marland Kitchen CAD (coronary artery disease) 01/25/2004   a. 01/2004 Ant MI/DES to LAD;  b. 10/2011 Neg MV.  . Chronic systolic heart failure ([place])    a. 08/2016 Echo: EF 35%, nl RV fxn, mod to sev MR, mild to mod TR, mod biatrial enlargement.  . Hyperlipidemia   . Hypertension   . Ischemic cardiomyopathy    a. 08/2016 Echo: EF 35%.  . Moderate to Severe Mitral regurgitation    a. 08/2016 Echo: Mod-sev MR.  Marland Kitchen Permanent atrial fibrillation    a. Chronic coumadin (CHA2DS2VASc = 5).  . Pulmonary nodules    Noted on abdominal CT  . Statin intolerance      SURGICAL HISTORY   Past Surgical History:  Procedure Laterality Date  . ABDOMINAL AORTIC ANEURYSM REPAIR     12/02/2007 [facility]  . ABDOMINAL AORTIC ANEURYSM REPAIR  2006   [facility]   . CARDIAC CATHETERIZATION  2009   [facility]  . CORONARY ANGIOPLASTY WITH STENT PLACEMENT  2005   Cypher stent LAD   . Cypher stents to LAD     01/25/2004  . SKIN SURGERY  2016   [facility] skin cancer     FAMILY HISTORY   Family History  Problem Relation Age of Onset  . Heart attack Father 43       Died w/ Heart attack  . Diabetes Father   . Heart attack Brother   . Heart disease Brother   . Diabetes Sister   . Heart disease Sister      SOCIAL HISTORY   Social History    Tobacco Use  . Smoking status: Former Smoker    Last attempt to quit: 12/01/1987    Years since quitting: 31.0  . Smokeless tobacco: Never Used  Substance Use Topics  . Alcohol use: No  . Drug use: No     MEDICATIONS    Home Medication:    Current Medication:  Current Facility-Administered Medications:  .  benzonatate (TESSALON) capsule 100 mg, 100 mg, Oral, TID PRN, Ouma, Bing Neighbors, NP .  bisacodyl (DULCOLAX) EC tablet 5 mg, 5 mg, Oral, Daily PRN, Lang Snow, NP .  digoxin (LANOXIN) tablet 0.0625 mg, 0.0625 mg, Oral, Daily, Ouma, Bing Neighbors, NP, 0.0625 mg at 11/28/18 0855 .  docusate sodium (COLACE) capsule 200 mg, 200 mg, Oral, BID, Ouma, Bing Neighbors, NP, 200 mg at 11/28/18 0855 .  fluticasone (FLONASE) 50 MCG/ACT nasal spray 1 spray, 1 spray, Each Nare, Daily, Ouma, Bing Neighbors, NP, 1 spray at 11/28/18 0859 .  furosemide (LASIX) injection 20 mg, 20 mg,  Intravenous, BID, Max Sane, MD, 20 mg at 11/28/18 0859 .  multivitamin with minerals tablet 1 tablet, 1 tablet, Oral, Daily, Ouma, Bing Neighbors, NP, 1 tablet at 11/28/18 [phone] .  pantoprazole (PROTONIX) EC tablet 40 mg, 40 mg, Oral, Daily, Ouma, Bing Neighbors, NP, 40 mg at 11/28/18 0857 .  predniSONE (DELTASONE) tablet 40 mg, 40 mg, Oral, Q breakfast, Kadir Azucena, MD, 40 mg at 11/28/18 0856 .  sulfamethoxazole-trimethoprim (BACTRIM) 400-80 MG per tablet 1 tablet, 1 tablet, Oral, Q12H, Ottie Glazier, MD, 1 tablet at 11/28/18 0857 .  tamsulosin (FLOMAX) capsule 0.4 mg, 0.4 mg, Oral, Daily, Ouma, Bing Neighbors, NP, 0.4 mg at 11/28/18 0855 .  traZODone (DESYREL) tablet 50 mg, 50 mg, Oral, QHS PRN, Ouma, Bing Neighbors, NP .  warfarin (COUMADIN) tablet 2 mg, 2 mg, Oral, ONCE-1800, Shanlever, Pierce Crane, RPH .  Warfarin - Pharmacist Dosing Inpatient, , Does not apply, q1800, Shanlever, Pierce Crane, RPH    ALLERGIES   Atorvastatin and Simvastatin     REVIEW OF  SYSTEMS    Review of Systems:  Gen:  Denies  fever, sweats, chills weigh loss  HEENT: Denies blurred vision, double vision, ear pain, eye pain, hearing loss, nose bleeds, sore throat Cardiac:  No dizziness, chest pain or heaviness, chest tightness,edema Resp:   Denies cough or sputum porduction, shortness of breath,wheezing, hemoptysis,  Gi: Denies swallowing difficulty, stomach pain, nausea or vomiting, diarrhea, constipation, bowel incontinence Gu:  Denies bladder incontinence, burning urine Ext:   Denies Joint pain, stiffness or swelling Skin: Denies  skin rash, easy bruising or bleeding or hives Endoc:  Denies polyuria, polydipsia , polyphagia or weight change Psych:   Denies depression, insomnia or hallucinations   Other:  All other systems negative   VS: BP (!) 117/59 (BP Location: Right Arm)   Pulse 63   Temp 98 F (36.7 C)   Resp 20   Ht 6\' 2"  (1.88 m)   Wt 80.2 kg   SpO2 (!) 85%   BMI 22.69 kg/m      PHYSICAL EXAM    GENERAL:NAD, no fevers, chills, no weakness no fatigue HEAD: Normocephalic, atraumatic.  EYES: Pupils equal, round, reactive to light. Extraocular muscles intact. No scleral icterus.  MOUTH: Moist mucosal membrane. Dentition intact. No abscess noted.  EAR, NOSE, THROAT: Clear without exudates. No external lesions.  NECK: Supple. No thyromegaly. No nodules. No JVD.  PULMONARY: Diffuse Velcro crackles bilaterally CARDIOVASCULAR: S1 and S2. Regular rate and rhythm. No murmurs, rubs, or gallops. No edema. Pedal pulses 2+ bilaterally.  GASTROINTESTINAL: Soft, nontender, nondistended. No masses. Positive bowel sounds. No hepatosplenomegaly.  MUSCULOSKELETAL: No swelling, clubbing, or edema. Range of motion full in all extremities.  NEUROLOGIC: Cranial nerves II through XII are intact. No gross focal neurological deficits. Sensation intact. Reflexes intact.  SKIN: No ulceration, lesions, rashes, or cyanosis. Skin warm and dry. Turgor intact.  PSYCHIATRIC:  Mood, affect within normal limits. The patient is awake, alert and oriented x 3. Insight, judgment intact.       IMAGING    Dg Chest Portable 1 View  Result Date: 11/20/2018 CLINICAL DATA:  Shortness of breath with chronic lung injury EXAM: PORTABLE CHEST 1 VIEW COMPARISON:  10/31/2018 FINDINGS: Compared with priors there is chronic lung disease with superimposed acute airspace disease that is stable in extent. Amiodarone toxicity was noted per chart. No visible effusion or pneumothorax. Normal heart size. IMPRESSION: Acute on chronic airspace disease with unchanged opacity compared to 10/31/2018. Note that superimposed pneumonia  or failure could easily be obscured by the extensive baseline opacity. Electronically Signed   By: Monte Fantasia M.D.   On: 11/05/2018 06:56   Dg Chest Port 1 View  Result Date: 10/31/2018 CLINICAL DATA:  Pneumonia. EXAM: PORTABLE CHEST 1 VIEW COMPARISON:  10/29/2018 FINDINGS: Stable mild cardiac enlargement. Severe bilateral pneumonia again noted superimposed on chronic lung disease. Density of peripheral consolidation in the right upper lobe appears slightly improved. Other areas of airspace disease bilaterally appear stable. Lung volumes are stable. No evidence of pleural fluid or pneumothorax. IMPRESSION: Relatively stable severe bilateral pneumonia with slightly improved density of consolidation in the peripheral right upper lobe. Electronically Signed   By: Aletta Edouard M.D.   On: 10/31/2018 08:21      ASSESSMENT/PLAN       Acute on chronic hypoxemic respiratory failure -due to amiodarone induced pulmonary toxicity -s/p IV solumedrol           - will switch to prednisone 40 mg            - starting Bactrim SS q12 for PCP ppx            - please wean O2 as tolerated            - chest physiotherapy            - IS at bedside- able to inhale to 1250cc sp - home with O2 at least for short term  - pulmonary clinic  follow up will set up post d/c           - no need for albuterol nebs or DuoNEbs patient goes into Atrial fibrillation           -Discussed warfarin and Bactrim drug/drug interaction with pharmacist, patient has INR monitoring and will adjust warfarin dose as needed     Thank you for allowing me to participate in the care of this patient.     Patient/Family are satisfied with care plan and all questions have been answered.  This document was prepared using Dragon voice recognition software and may include unintentional dictation errors.     Ottie Glazier, M.D.  Division of [place]

## 2018-11-28 NOTE — Plan of Care (Signed)
Problem: Activity: Goal: Risk for activity intolerance will decrease 11/28/2018 1604 by Brenae Lasecki, [place] Pho, RN Outcome: Not Progressing Note:  Pt desaturating with activity. 11/28/2018 1604 by Aubery Lapping, RN Outcome: Progressing   Problem: Safety: Goal: Ability to remain free from injury will improve 11/28/2018 1604 by Toure Edmonds, Fayette Pho, RN Outcome: Not Progressing Note:  Pt confused at beginning of shift regarding situation.  Pt pulled out his IV, and removed his telemetry monitoring equipment multiple times.  Pt reoriented and was cooperative. Pt became less restless as day shift progressed. 11/28/2018 1604 by Aubery Lapping, RN Outcome: Progressing   Problem: Respiratory: Goal: Ability to maintain adequate ventilation will improve 11/28/2018 1604 by Aubery Lapping, RN Outcome: Not Progressing Note:  Pulmonologist ordered for pt to be weaned from 4 L/min via [place] to 2-3 L/min which is what the pt uses at home.  Pt was decreased to 3 L/min via [place] at 0900.  Tolerated 3 L/min via [place] without complication.  Around 1445, PT found pt on 2 L/min via [place] with SpO2 79%.  Use of NRB was required to recover pt's SpO2 >90%.  RT assisted with weaning pt to 6 L/min via  with SpO2 90-92%.  Hospitalist and Pulmonologist updated and new orders for CXR obtained. 11/28/2018 1604 by Aubery Lapping, RN Outcome: Progressing   Problem: Education: Goal: Knowledge of General Education information will improve Description Including pain rating scale, medication(s)/side effects and non-pharmacologic comfort measures 11/28/2018 1604 by Aubery Lapping, RN Outcome: Progressing 11/28/2018 1604 by Aubery Lapping, RN Outcome: Progressing   Problem: Health Behavior/Discharge Planning: Goal: Ability to manage health-related needs will improve 11/28/2018 1604 by Aubery Lapping, RN Outcome: Progressing 11/28/2018 1604 by Aubery Lapping, RN Outcome: Progressing   Problem:  Clinical Measurements: Goal: Ability to maintain clinical measurements within normal limits will improve 11/28/2018 1604 by Aubery Lapping, RN Outcome: Progressing 11/28/2018 1604 by Aubery Lapping, RN Outcome: Progressing Goal: Will remain free from infection 11/28/2018 1604 by Aubery Lapping, RN Outcome: Progressing 11/28/2018 1604 by Aubery Lapping, RN Outcome: Progressing Goal: Diagnostic test results will improve 11/28/2018 1604 by Aubery Lapping, RN Outcome: Progressing 11/28/2018 1604 by Aubery Lapping, RN Outcome: Progressing Goal: Respiratory complications will improve 11/28/2018 1604 by Aubery Lapping, RN Outcome: Progressing 11/28/2018 1604 by Aubery Lapping, RN Outcome: Progressing Goal: Cardiovascular complication will be avoided 11/28/2018 1604 by Aubery Lapping, RN Outcome: Progressing 11/28/2018 1604 by Aubery Lapping, RN Outcome: Progressing   Problem: Nutrition: Goal: Adequate nutrition will be maintained 11/28/2018 1604 by Aubery Lapping, RN Outcome: Progressing 11/28/2018 1604 by Aubery Lapping, RN Outcome: Progressing   Problem: Coping: Goal: Level of anxiety will decrease 11/28/2018 1604 by Aubery Lapping, RN Outcome: Progressing 11/28/2018 1604 by Aubery Lapping, RN Outcome: Progressing   Problem: Elimination: Goal: Will not experience complications related to bowel motility 11/28/2018 1604 by Aubery Lapping, RN Outcome: Progressing 11/28/2018 1604 by Aubery Lapping, RN Outcome: Progressing Goal: Will not experience complications related to urinary retention 11/28/2018 1604 by Aubery Lapping, RN Outcome: Progressing 11/28/2018 1604 by Aubery Lapping, RN Outcome: Progressing   Problem: Pain Managment: Goal: General experience of comfort will improve 11/28/2018 1604 by Aubery Lapping, RN Outcome: Progressing 11/28/2018 1604 by Aubery Lapping, RN Outcome:  Progressing   Problem: Skin Integrity: Goal: Risk for impaired skin integrity will decrease 11/28/2018 1604 by Aubery Lapping, RN Outcome: Progressing 11/28/2018  1604 by Aubery Lapping, RN Outcome: Progressing   Problem: Activity: Goal: Ability to tolerate increased activity will improve 11/28/2018 1604 by Aubery Lapping, RN Outcome: Progressing 11/28/2018 1604 by Aubery Lapping, RN Outcome: Progressing   Problem: Clinical Measurements: Goal: Ability to maintain a body temperature in the normal range will improve 11/28/2018 1604 by Aubery Lapping, RN Outcome: Progressing 11/28/2018 1604 by Aubery Lapping, RN Outcome: Progressing   Problem: Respiratory: Goal: Ability to maintain a clear airway will improve 11/28/2018 1604 by Aubery Lapping, RN Outcome: Progressing 11/28/2018 1604 by Aubery Lapping, RN Outcome: Progressing

## 2018-11-29 ENCOUNTER — Inpatient Hospital Stay: Payer: Medicare Other

## 2018-11-29 DIAGNOSIS — I959 Hypotension, unspecified: Secondary | ICD-10-CM

## 2018-11-29 DIAGNOSIS — J984 Other disorders of lung: Secondary | ICD-10-CM

## 2018-11-29 DIAGNOSIS — T462X5A Adverse effect of other antidysrhythmic drugs, initial encounter: Secondary | ICD-10-CM

## 2018-11-29 DIAGNOSIS — Z7189 Other specified counseling: Secondary | ICD-10-CM

## 2018-11-29 DIAGNOSIS — Z515 Encounter for palliative care: Secondary | ICD-10-CM

## 2018-11-29 DIAGNOSIS — J189 Pneumonia, unspecified organism: Secondary | ICD-10-CM

## 2018-11-29 DIAGNOSIS — J9621 Acute and chronic respiratory failure with hypoxia: Secondary | ICD-10-CM

## 2018-11-29 DIAGNOSIS — I482 Chronic atrial fibrillation, unspecified: Secondary | ICD-10-CM

## 2018-11-29 LAB — BLOOD GAS, ARTERIAL
Acid-Base Excess: 5.1 mmol/L — ABNORMAL HIGH (ref 0.0–2.0)
Bicarbonate: 27.6 mmol/L (ref 20.0–28.0)
FIO2: 100
O2 Saturation: 88.9 %
Patient temperature: 37
pCO2 arterial: 33 mmHg (ref 32.0–48.0)
pH, Arterial: 7.53 — ABNORMAL HIGH (ref 7.350–7.450)
pO2, Arterial: 49 mmHg — ABNORMAL LOW (ref 83.0–108.0)

## 2018-11-29 LAB — BASIC METABOLIC PANEL
Anion gap: 13 (ref 5–15)
BUN: 34 mg/dL — ABNORMAL HIGH (ref 8–23)
CO2: 26 mmol/L (ref 22–32)
Calcium: 8.8 mg/dL — ABNORMAL LOW (ref 8.9–10.3)
Chloride: 98 mmol/L (ref 98–111)
Creatinine, Ser: 1.07 mg/dL (ref 0.61–1.24)
GFR calc Af Amer: >60 mL/min (ref >60)
GFR calc non Af Amer: >60 mL/min (ref >60)
Glucose, Bld: 79 mg/dL (ref 70–99)
Potassium: 4.1 mmol/L (ref 3.5–5.1)
Sodium: 137 mmol/L (ref 135–145)

## 2018-11-29 LAB — CBC
HCT: 35.5 % — ABNORMAL LOW (ref 39.0–52.0)
Hemoglobin: 11.5 g/dL — ABNORMAL LOW (ref 13.0–17.0)
MCH: 30 pg (ref 26.0–34.0)
MCHC: 32.4 g/dL (ref 30.0–36.0)
MCV: 92.7 fL (ref 80.0–100.0)
Platelets: 150 10*3/uL (ref 150–400)
RBC: 3.83 MIL/uL — ABNORMAL LOW (ref 4.22–5.81)
RDW: 16 % — ABNORMAL HIGH (ref 11.5–15.5)
WBC: 11.9 10*3/uL — ABNORMAL HIGH (ref 4.0–10.5)
nRBC: 0 % (ref 0.0–0.2)

## 2018-11-29 LAB — GLUCOSE, CAPILLARY: Glucose-Capillary: 212 mg/dL — ABNORMAL HIGH (ref 70–99)

## 2018-11-29 LAB — BRAIN NATRIURETIC PEPTIDE: B Natriuretic Peptide: 984 pg/mL — ABNORMAL HIGH (ref 0.0–100.0)

## 2018-11-29 LAB — PROTIME-INR
INR: 1.8 — ABNORMAL HIGH (ref 0.8–1.2)
Prothrombin Time: 20.4 seconds — ABNORMAL HIGH (ref 11.4–15.2)

## 2018-11-29 MED ORDER — LACTATED RINGERS IV BOLUS
250.0000 mL | Freq: Once | INTRAVENOUS | Status: AC
  Administered 2018-11-29: 250 mL via INTRAVENOUS

## 2018-11-29 MED ORDER — METOPROLOL SUCCINATE ER 25 MG PO TB24: 12.5000 mg | ORAL_TABLET | Freq: Every day | ORAL | Status: DC

## 2018-11-29 MED ORDER — WARFARIN SODIUM 2 MG PO TABS
2.0000 mg | ORAL_TABLET | Freq: Once | ORAL | Status: AC
  Administered 2018-11-29: 2 mg via ORAL
  Filled 2018-11-29: qty 1

## 2018-11-29 MED ORDER — MIDODRINE HCL 5 MG PO TABS
10.0000 mg | ORAL_TABLET | Freq: Three times a day (TID) | ORAL | Status: DC
  Administered 2018-11-30 – 2018-12-01 (×6): 10 mg via ORAL
  Filled 2018-11-29 (×8): qty 2

## 2018-11-29 MED ORDER — MIDODRINE HCL 5 MG PO TABS
5.0000 mg | ORAL_TABLET | Freq: Three times a day (TID) | ORAL | Status: DC
  Administered 2018-11-29: 5 mg via ORAL
  Filled 2018-11-29: qty 1

## 2018-11-29 NOTE — Progress Notes (Addendum)
PT Cancellation Note  Patient Details Name: Joseph Barton MRN: [id number] DOB: [birth year]   Cancelled Treatment:    Reason Eval/Treat Not Completed: (Consult received and chart reviewed. Patient currently with physician for consult/rounding.  Will re-attempt at later time/date as medically appropriate and available.)   Of note, patient now on HFNC for respiratory support.   Danyal Adorno H. Owens Shark, PT, DPT, NCS 11/29/18, 2:33 PM [phone]

## 2018-11-29 NOTE — Consult Note (Signed)
Consultation Note Date: 11/29/2018   Patient Name: Joseph Barton  DOB: May 17, 1927  MRN: [id number]  Age / Sex: 85 y.o., male  PCP: Jerrol Banana., MD Referring Physician: Epifanio Lesches, MD   Reason for Consultation: Establishing goals of care  HPI/Patient Profile: 85 y.o. male  with past medical history of chronic systolic heart failure, ischemic cardiomyopathy with EF 25-30%, CAD, amiodarone-induced lung injury, HTN, HLD, atrial fibrillation on coumadin, pulmonary nodules, moderate to severe mitral regurgitation, statin intolerance, AAA s/p repair admitted on 11/29/2018 with shortness of breath. Hospital admission for acute on chronic hypoxic respiratory failure secondary to amiodarone induced lung injury with underlying chronic CHF. Pulmonary consulted and recommending treatment for PCP pneumonia and f/u in outpatient clinic. On 5/28 and 5/29 AM, patient with hypoxia requiring NRB mask and hypotension. Lasix and metoprolol on hold. Palliative medicine consultation for goals of care.   Clinical Assessment and Goals of Care:  I have reviewed medical records, discussed with care team, and met with patient at bedside to discuss diagnosis, GOC, EOL wishes, disposition and options.  Introduced Palliative Medicine as specialized medical care for people living with serious illness. It focuses on providing relief from the symptoms and stress of a serious illness. The goal is to improve quality of life for both the patient and the family.  We discussed a brief life review of the patient. Married to wife, Joseph Barton for 101 years! They have on daughter and one grandchild. 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 [place] [employer], research (life sciences). Prior to hospitalization, living home with wife and independent of ADL's. Patient reports mowing the lawn a few days prior to admission.   Discussed events leading up to  admission and course of hospitalization including diagnoses and interventions. Explained concern of care team with worsening clinical status today with oxygen desaturation requiring HFNC and low blood pressure. Patient denies symptoms and states "I feel fine." He jokes that if I provided him a walker, he would walk to the nurses station and back.   I attempted to elicit values and goals of care important to the patient. Advanced directives and concepts specific to code status were discussed. Patient shares that he does not have a documented living will but that him and his wife have discussed EOL wishes and agree with decision against heroic measures including resuscitation and life support. Confirmed patient decision for DNR/DNI code status, especially with tenuous clinical condition.   Explained concern that if blood pressure does not respond to other interventions (Unable to give aggressive fluid resuscitation with underlying heart failure) and/or Midodrine, he may require transfer to ICU for central line placement and vasopressor support. Explored patients thoughts regarding ICU transfer. Mr. Opheim is ok with transfer to ICU for pressors if absolutely necessary. Again confirmed his decision for DNR/DNI code status.   Therapeutic listening as patient shares his hope that he would return home, but now after this conversation, feeling that this might be the last year he celebrates a birthday and anniversary.  He is tearful. He speaks of his [place] and being "ready" when it is his time, but again remaining hopeful that he will return home. Emotional/spiritual support provided. We discussed 'hoping for the best' but also 'preparing for the worst.'   Patient is agreeable with me to call his wife and update her on plan of care.  Dr. Lanney Gins at bedside during my visit with plans to start Midodrine. Patient requests that Dr. Lanney Gins also contact his wife.  Questions and concerns were addressed.   **Shortly after, spoke with wife, Joseph Barton, and updated her on plan of care including diagnoses, interventions, and tenuous clinical condition. Joseph Barton also confirms her husbands decision for DNR/DNI code status. She understands transfer to ICU if necessary. Wife requests updates from care team this weekend. Emotional support provided. She is appreciative of call.    SUMMARY OF RECOMMENDATIONS    GOC discussion with patient and wife. They both confirm patient's decision for DNR/DNI code status.   Patient/wife ok with transfer to ICU for central line/vasopressor support if necessary.   Continue current plan of care and management. Watchful waiting.   Please provide daily updates to wife. Call cell phone: [phone]  Tenuous clinical condition and high risk for decompensation. PMT not at [facility] through the weekend but will f/u Monday to further support patient/family.   Code Status/Advance Care Planning:  DNR  Symptom Management:   Per attending  Palliative Prophylaxis:   Aspiration, Delirium Protocol, Frequent Pain Assessment and Oral Care  Psycho-social/Spiritual:   Desire for further Chaplaincy support:yes  Additional Recommendations: Caregiving  Support/Resources  Prognosis:   Guarded with acute on chronic respiratory failure 2/2 amiodarone-induced lung toxicity, multilobar pneumonia, and underlying chronic CHF with EF 25-30%. Hypoxia requiring HFNC.    Discharge Planning: To Be Determined      Primary Diagnoses: Present on Admission: . Acute respiratory failure with hypoxia ([place])   I have reviewed the medical record, interviewed the patient and family, and examined the patient. The following aspects are pertinent.  Past Medical History:  Diagnosis Date  . Abdominal aortic aneurysm ([place]) 01-2004   4.7 x 4.7 cm.  Marland Kitchen CAD (coronary artery disease) 01/25/2004   a. 01/2004 Ant MI/DES to LAD;  b. 10/2011 Neg MV.  . Chronic systolic heart failure ([place])    a. 08/2016  Echo: EF 35%, nl RV fxn, mod to sev MR, mild to mod TR, mod biatrial enlargement.  . Hyperlipidemia   . Hypertension   . Ischemic cardiomyopathy    a. 08/2016 Echo: EF 35%.  . Moderate to Severe Mitral regurgitation    a. 08/2016 Echo: Mod-sev MR.  Marland Kitchen Permanent atrial fibrillation    a. Chronic coumadin (CHA2DS2VASc = 5).  . Pulmonary nodules    Noted on abdominal CT  . Statin intolerance    Social History   Socioeconomic History  . Marital status: Married    Spouse name: Not on file  . Number of children: 1  . Years of education: Not on file  . Highest education level: Not on file  Occupational History  . Not on file  Social Needs  . Financial resource strain: Not on file  . Food insecurity:    Worry: Not on file    Inability: Not on file  . Transportation needs:    Medical: Not on file    Non-medical: Not on file  Tobacco Use  . Smoking status: Former Smoker    Last attempt to quit: 12/01/1987    Years  since quitting: 31.0  . Smokeless tobacco: Never Used  Substance and Sexual Activity  . Alcohol use: No  . Drug use: No  . Sexual activity: Not Currently  Lifestyle  . Physical activity:    Days per week: Not on file    Minutes per session: Not on file  . Stress: Not on file  Relationships  . Social connections:    Talks on phone: Not on file    Gets together: Not on file    Attends religious service: Not on file    Active member of club or organization: Not on file    Attends meetings of clubs or organizations: Not on file    Relationship status: Not on file  Other Topics Concern  . Not on file  Social History Narrative   Lives at home with his family.  Independent at baseline   Family History  Problem Relation Age of Onset  . Heart attack Father 20       Died w/ Heart attack  . Diabetes Father   . Heart attack Brother   . Heart disease Brother   . Diabetes Sister   . Heart disease Sister    Scheduled Meds: . digoxin  0.0625 mg Oral Daily  . docusate  sodium  200 mg Oral BID  . fluticasone  1 spray Each Nare Daily  . midodrine  5 mg Oral TID WC  . multivitamin with minerals  1 tablet Oral Daily  . pantoprazole  40 mg Oral Daily  . predniSONE  40 mg Oral Q breakfast  . sulfamethoxazole-trimethoprim  1 tablet Oral Q12H  . tamsulosin  0.4 mg Oral Daily  . warfarin  2 mg Oral ONCE-1800  . Warfarin - Pharmacist Dosing Inpatient   Does not apply q1800   Continuous Infusions: PRN Meds:.bisacodyl, traZODone Medications Prior to Admission:  Prior to Admission medications   Medication Sig Start Date End Date Taking? Authorizing Provider  albuterol (PROVENTIL HFA;VENTOLIN HFA) 108 (90 Base) MCG/ACT inhaler Inhale 2 puffs into the lungs every 6 (six) hours as needed for wheezing or shortness of breath. 11/29/17  Yes Jerrol Banana., MD  Bioflavonoid Products (BIOFLEX PO) Take 1 tablet by mouth daily.    Yes [provider]  bisacodyl (DULCOLAX) 5 MG EC tablet Take 1 tablet (5 mg total) by mouth daily as needed for moderate constipation. 11/01/18  Yes Joseph Basta, MD  digoxin (LANOXIN) 0.125 MG tablet Take 0.0625 mg by mouth daily.    Yes [provider]  docusate sodium (COLACE) 100 MG capsule Take 2 capsules (200 mg total) by mouth 2 (two) times daily. 11/01/18  Yes Joseph Basta, MD  fluticasone (FLONASE) 50 MCG/ACT nasal spray Place 1 spray into both nostrils daily. 11/02/18  Yes Joseph Basta, MD  furosemide (LASIX) 20 MG tablet TAKE 2 TABLETS BY MOUTH  ONCE DAILY 11/26/18  Yes Deboraha Sprang, MD  ipratropium-albuterol (DUONEB) 0.5-2.5 (3) MG/3ML SOLN Take 3 mLs by nebulization 2 (two) times daily. 11/01/18  Yes Joseph Basta, MD  Multiple Vitamin (MULTIVITAMIN WITH MINERALS) TABS tablet Take 1 tablet by mouth daily. 11/02/18  Yes Joseph Basta, MD  omeprazole (PRILOSEC) 20 MG capsule TAKE 1 CAPSULE BY MOUTH  DAILY 02/16/18  Yes Jerrol Banana., MD  polyethylene glycol powder  [facility]) powder Take 1 scoop by mouth daily. 07/29/18  Yes Jerrol Banana., MD  predniSONE (DELTASONE) 20 MG tablet Use 40 mg daily for 20 days- until 11/22/18, then take  20 mg daily for next 20 days until 12/12/18., then take 10 mg daily for next 20 days until 12/31/18. 11/01/18  Yes Joseph Basta, MD  sulfamethoxazole-trimethoprim (BACTRIM) 400-80 MG tablet Take 1 tablet by mouth daily. 11/02/18 01/01/19 Yes Joseph Basta, MD  tamsulosin (FLOMAX) 0.4 MG CAPS capsule Take 1 capsule (0.4 mg total) by mouth daily. 07/29/18  Yes Jerrol Banana., MD  traZODone (DESYREL) 50 MG tablet Take 1 tablet (50 mg total) by mouth at bedtime as needed for sleep. 11/01/18  Yes Joseph Basta, MD  warfarin (COUMADIN) 1 MG tablet Take 1 tablet (1 mg total) by mouth daily at 6 PM. 11/01/18  Yes Joseph Basta, MD  benzonatate (TESSALON) 100 MG capsule Take 1 capsule (100 mg total) by mouth 3 (three) times daily as needed for cough. Patient not taking: Reported on 11/28/2018 11/01/18   Joseph Basta, MD  guaiFENesin-dextromethorphan [facility] DM) 100-10 MG/5ML syrup Take 5 mLs by mouth every 4 (four) hours as needed for cough. Patient not taking: Reported on 11/28/2018 11/01/18   Joseph Basta, MD   Allergies  Allergen Reactions  . Atorvastatin   . Simvastatin    Review of Systems  Respiratory: Positive for shortness of breath.   Neurological: Positive for weakness.   Physical Exam Vitals signs and nursing note reviewed.  Constitutional:      General: He is awake.     Appearance: He is ill-appearing.  HENT:     Head: Normocephalic and atraumatic.  Cardiovascular:     Rate and Rhythm: Rhythm irregular.     Comments: Afib/hypotension Pulmonary:     Effort: No tachypnea, accessory muscle usage or respiratory distress.     Breath sounds: Decreased breath sounds present.     Comments: HFNC 50% Abdominal:     Tenderness: There is no abdominal tenderness.   Skin:    General: Skin is warm and dry.     Coloration: Skin is pale.  Neurological:     Mental Status: He is alert and oriented to person, place, and time.  Psychiatric:        Mood and Affect: Mood normal.        Speech: Speech normal.        Behavior: Behavior normal.        Cognition and Memory: Cognition normal.    Vital Signs: BP (!) 87/61   Pulse 80   Temp 97.8 F (36.6 C) (Oral)   Resp 19   Ht _0  (1.88 m)   Wt 79.3 kg   SpO2 (!) 88%   BMI 22.45 kg/m  Pain Scale: 0-10   Pain Score: 0-No pain   SpO2: SpO2: (!) 88 % O2 Device:SpO2: (!) 88 % O2 Flow Rate: .O2 Flow Rate (L/min): 6 L/min  IO: Intake/output summary:   Intake/Output Summary (Last 24 hours) at 11/29/2018 1529 Last data filed at 11/29/2018 1456 Gross per 24 hour  Intake -  Output 945 ml  Net -945 ml    LBM: Last BM Date: 11/28/18(per pt) Baseline Weight: Weight: 81.6 kg Most recent weight: Weight: 79.3 kg     Palliative Assessment/Data: PPS 50%   Flowsheet Rows     Most Recent Value  Intake Tab  Referral Department  Hospitalist  Unit at Time of Referral  Cardiac/Telemetry Unit  Palliative Care Primary Diagnosis  Pulmonary  Date Notified  11/29/18  Palliative Care Type  New Palliative care  Reason for referral  Clarify Goals of Care  Date first seen by  Palliative Care  11/29/18  # of days Palliative referral response time  0 Day(s)  Clinical Assessment  Palliative Performance Scale Score  50%  Psychosocial & Spiritual Assessment  Palliative Care Outcomes  Patient/Family meeting held?  Yes  Who was at the meeting?  patient and wife  Palliative Care Outcomes  Clarified goals of care, Provided end of life care assistance, Provided psychosocial or spiritual support, ACP counseling assistance      Time In: 1330 Time Out: 1500 Time Total: 90 Greater than 50%  of this time was spent counseling and coordinating care related to the above assessment and plan.  Signed by:  Ihor Dow,  FNP-C Palliative Medicine Team  Phone: [phone] Fax: [phone]   Please contact Palliative Medicine Team phone at [phone] for questions and concerns.  For individual provider: See Shea Barton

## 2018-11-29 NOTE — Consult Note (Addendum)
ANTICOAGULATION CONSULT NOTE - Initial Consult  Pharmacy Consult for Warfarin Indication: atrial fibrillation  Allergies  Allergen Reactions  . Atorvastatin   . Simvastatin     Patient Measurements: Height: 6\' 2"  (188 cm) Weight: 174 lb 13.2 oz (79.3 kg) IBW/kg (Calculated) : 82.2  Vital Signs: Temp: 97.8 F (36.6 C) (05/29 0802) Temp Source: Oral (05/29 0802) BP: 97/64 (05/29 0802) Pulse Rate: 50 (05/29 0802)  Labs: Recent Labs    11/25/2018 0603 11/30/2018 0654 11/13/2018 1248 11/28/18 0357 11/29/18 0357  HGB 13.2  --   --  11.6* 11.5*  HCT 41.1  --   --  35.9* 35.5*  PLT 160  --   --  151 150  LABPROT  --  16.8*  --  16.3* 20.4*  INR  --  1.4*  --  1.3* 1.8*  CREATININE 1.35*  --   --  1.02  --   TROPONINI 0.03*  --  <0.03  --   --     Estimated Creatinine Clearance: 54 mL/min (by C-G formula based on SCr of 1.02 mg/dL).   Medical History: Past Medical History:  Diagnosis Date  . Abdominal aortic aneurysm ([place]) 01-2004   4.7 x 4.7 cm.  Marland Kitchen CAD (coronary artery disease) 01/25/2004   a. 01/2004 Ant MI/DES to LAD;  b. 10/2011 Neg MV.  . Chronic systolic heart failure ([place])    a. 08/2016 Echo: EF 35%, nl RV fxn, mod to sev MR, mild to mod TR, mod biatrial enlargement.  . Hyperlipidemia   . Hypertension   . Ischemic cardiomyopathy    a. 08/2016 Echo: EF 35%.  . Moderate to Severe Mitral regurgitation    a. 08/2016 Echo: Mod-sev MR.  Marland Kitchen Permanent atrial fibrillation    a. Chronic coumadin (CHA2DS2VASc = 5).  . Pulmonary nodules    Noted on abdominal CT  . Statin intolerance     Medications:  PTA dose 1mg  Warfarin qd (5/26 note from [facility] - primary care provider to increase dose to 2mg  qd)  Assessment: Permanent A Fib with CHA2DS2VASc score of 5.    INR 1.8 - Progressing cautiously because of drug interaction with Bactrim - will likely be in therapeutic range tomorrow  Goal of Therapy:  INR 2-3 Monitor platelets by anticoagulation protocol: Yes    Plan:  Will continue newly increased dose of 2mg  daily.   Will check INR daily and CBC a minimum of every 3 days while on warfarin.   Lu Duffel, PharmD, BCPS Clinical Pharmacist 11/29/2018 8:18 AM

## 2018-11-29 NOTE — Progress Notes (Addendum)
MD's notified of patient's low blood pressure readings. Per Pulmonology keep monitoring patient.   Update 1500: patients blood pressure has been monitored closely every 30 mintues, MD's aware of readings. Patient tolerating heated high flow nasal cannula. Will continue to monitor patient.   Wife Zella updated.

## 2018-11-29 NOTE — Progress Notes (Signed)
Patient transferred to stepdown by Corrie Dandy along with another staff member.Handoff given. Will call wife to update.

## 2018-11-29 NOTE — Progress Notes (Signed)
Pulmonary Medicine          Date: 11/29/2018,   MRN# [id number] Joseph Barton Dec 28, 1926     AdmissionWeight: 81.6 kg                 CurrentWeight: 79.3 kg         SUBJECTIVE    Have evaluated patient multiple times today.  He has been persistently having low Bps.  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 He is DNR/DNI.    Spoke to wife Zella she and her daughter Olegario Shearer would like to come in to see him here in MICU.    PAST MEDICAL HISTORY   Past Medical History:  Diagnosis Date  . Abdominal aortic aneurysm ([place]) 01-2004   4.7 x 4.7 cm.  Marland Kitchen CAD (coronary artery disease) 01/25/2004   a. 01/2004 Ant MI/DES to LAD;  b. 10/2011 Neg MV.  . Chronic systolic heart failure ([place])    a. 08/2016 Echo: EF 35%, nl RV fxn, mod to sev MR, mild to mod TR, mod biatrial enlargement.  . Hyperlipidemia   . Hypertension   . Ischemic cardiomyopathy    a. 08/2016 Echo: EF 35%.  . Moderate to Severe Mitral regurgitation    a. 08/2016 Echo: Mod-sev MR.  Marland Kitchen Permanent atrial fibrillation    a. Chronic coumadin (CHA2DS2VASc = 5).  . Pulmonary nodules    Noted on abdominal CT  . Statin intolerance      SURGICAL HISTORY   Past Surgical History:  Procedure Laterality Date  . ABDOMINAL AORTIC ANEURYSM REPAIR     12/02/2007 [facility]  . ABDOMINAL AORTIC ANEURYSM REPAIR  2006   [facility]   . CARDIAC CATHETERIZATION  2009   [facility]  . CORONARY ANGIOPLASTY WITH STENT PLACEMENT  2005   Cypher stent LAD   . Cypher stents to LAD     01/25/2004  . SKIN SURGERY  2016   [facility] skin cancer     FAMILY HISTORY   Family History  Problem Relation Age of Onset  . Heart attack Father 80       Died w/ Heart attack  . Diabetes Father   . Heart attack Brother   . Heart disease Brother   . Diabetes Sister   . Heart disease Sister       SOCIAL HISTORY   Social History   Tobacco Use  . Smoking status: Former Smoker    Last attempt to quit: 12/01/1987    Years since quitting: 31.0  . Smokeless tobacco: Never Used  Substance Use Topics  . Alcohol use: No  . Drug use: No     MEDICATIONS    Home Medication:    Current Medication:  Current Facility-Administered Medications:  .  benzonatate (TESSALON) capsule 100 mg, 100 mg, Oral, TID PRN, Ouma, Bing Neighbors, NP .  bisacodyl (DULCOLAX) EC tablet 5 mg, 5 mg, Oral, Daily PRN, Lang Snow, NP .  digoxin (LANOXIN) tablet 0.0625 mg, 0.0625 mg, Oral, Daily, Ouma, Bing Neighbors, NP, 0.0625 mg at 11/28/18 0855 .  docusate sodium (COLACE) capsule 200 mg, 200 mg, Oral, BID, Ouma, Bing Neighbors, NP, 200 mg at 11/28/18 2118 .  fluticasone (FLONASE) 50 MCG/ACT nasal spray 1 spray, 1 spray, Each Nare, Daily, Ouma, Bing Neighbors, NP, 1 spray at 11/28/18 0859 .  furosemide (LASIX)  injection 20 mg, 20 mg, Intravenous, BID, Max Sane, MD, 20 mg at 11/28/18 0859 .  metoprolol succinate (TOPROL-XL) 24 hr tablet 25 mg, 25 mg, Oral, Daily, Ouma, Bing Neighbors, NP, 25 mg at 11/28/18 1537 .  multivitamin with minerals tablet 1 tablet, 1 tablet, Oral, Daily, Ouma, Bing Neighbors, NP, 1 tablet at 11/28/18 [phone] .  pantoprazole (PROTONIX) EC tablet 40 mg, 40 mg, Oral, Daily, Ouma, Bing Neighbors, NP, 40 mg at 11/28/18 0857 .  predniSONE (DELTASONE) tablet 40 mg, 40 mg, Oral, Q breakfast, Ady Heimann, MD, 40 mg at 11/28/18 0856 .  sulfamethoxazole-trimethoprim (BACTRIM) 400-80 MG per tablet 1 tablet, 1 tablet, Oral, Q12H, Ottie Glazier, MD, 1 tablet at 11/28/18 2118 .  tamsulosin (FLOMAX) capsule 0.4 mg, 0.4 mg, Oral, Daily, Ouma, Bing Neighbors, NP, 0.4 mg at 11/28/18 0855 .  traZODone (DESYREL) tablet 50 mg, 50 mg, Oral, QHS PRN, Lang Snow, NP, 50 mg at 11/28/18 2118 .  Warfarin - Pharmacist Dosing Inpatient, , Does not apply, q1800,  Shanlever, Pierce Crane, RPH    ALLERGIES   Atorvastatin and Simvastatin     REVIEW OF SYSTEMS    Review of Systems:  Gen:  Denies  fever, sweats, chills weigh loss  HEENT: Denies blurred vision, double vision, ear pain, eye pain, hearing loss, nose bleeds, sore throat Cardiac:  No dizziness, chest pain or heaviness, chest tightness,edema Resp:   Intermittent cough, with SOB and DOE Gi: Denies swallowing difficulty, stomach pain, nausea or vomiting, diarrhea, constipation, bowel incontinence Gu:  Denies bladder incontinence, burning urine Ext:   Denies Joint pain, stiffness or swelling Skin: Denies  skin rash, easy bruising or bleeding or hives Endoc:  Denies polyuria, polydipsia , polyphagia or weight change Psych:   Denies depression, insomnia or hallucinations   Other:  All other systems negative   VS: BP (!) 87/50   Pulse 67   Temp 97.6 F (36.4 C) (Oral)   Resp 18   Ht 6\' 2"  (1.88 m)   Wt 79.3 kg   SpO2 93%   BMI 22.45 kg/m      PHYSICAL EXAM    GENERAL:NAD, no fevers, chills, no weakness no fatigue HEAD: Normocephalic, atraumatic.  EYES: Pupils equal, round, reactive to light. Extraocular muscles intact. No scleral icterus.  MOUTH: Moist mucosal membrane. Dentition intact. No abscess noted.  EAR, NOSE, THROAT: Clear without exudates. No external lesions.  NECK: Supple. No thyromegaly. No nodules. No JVD.  PULMONARY: velcro crackles bilaterally  CARDIOVASCULAR: S1 and S2. Regular rate and rhythm. No murmurs, rubs, or gallops. No edema. Pedal pulses 2+ bilaterally.  GASTROINTESTINAL: Soft, nontender, nondistended. No masses. Positive bowel sounds. No hepatosplenomegaly.  MUSCULOSKELETAL: No swelling, clubbing, or edema. Range of motion full in all extremities.  NEUROLOGIC: Cranial nerves II through XII are intact. No gross focal neurological deficits. Sensation intact. Reflexes intact.  SKIN: No ulceration, lesions, rashes, or cyanosis. Skin warm and dry.  Turgor intact.  PSYCHIATRIC: Mood, affect within normal limits. The patient is awake, alert and oriented x 3. Insight, judgment intact.       IMAGING    Dg Chest 1 View  Result Date: 11/28/2018 CLINICAL DATA:  Difficulty breathing. EXAM: CHEST  1 VIEW COMPARISON:  11/21/2018 FINDINGS: Heart size appears enlarged. Aortic atherosclerosis. Diffuse bilateral interstitial and airspace opacities identified throughout both lungs. When compared with the previous exam the aeration to the lungs appears unchanged. IMPRESSION: 1. No change in aeration a lungs compared with 11/18/2018. Electronically Signed  By: Kerby Moors M.D.   On: 11/28/2018 15:53   Dg Chest Portable 1 View  Result Date: 11/14/2018 CLINICAL DATA:  Shortness of breath with chronic lung injury EXAM: PORTABLE CHEST 1 VIEW COMPARISON:  10/31/2018 FINDINGS: Compared with priors there is chronic lung disease with superimposed acute airspace disease that is stable in extent. Amiodarone toxicity was noted per chart. No visible effusion or pneumothorax. Normal heart size. IMPRESSION: Acute on chronic airspace disease with unchanged opacity compared to 10/31/2018. Note that superimposed pneumonia or failure could easily be obscured by the extensive baseline opacity. Electronically Signed   By: Monte Fantasia M.D.   On: 11/08/2018 06:56   Dg Chest Port 1 View  Result Date: 10/31/2018 CLINICAL DATA:  Pneumonia. EXAM: PORTABLE CHEST 1 VIEW COMPARISON:  10/29/2018 FINDINGS: Stable mild cardiac enlargement. Severe bilateral pneumonia again noted superimposed on chronic lung disease. Density of peripheral consolidation in the right upper lobe appears slightly improved. Other areas of airspace disease bilaterally appear stable. Lung volumes are stable. No evidence of pleural fluid or pneumothorax. IMPRESSION: Relatively stable severe bilateral pneumonia with slightly improved density of consolidation in the peripheral right upper lobe. Electronically  Signed   By: Aletta Edouard M.D.   On: 10/31/2018 08:21      ASSESSMENT/PLAN   Acuteon chronichypoxemic respiratory failure -due toamiodarone induced pulmonary toxicity -s/p IV solumedrol  - will switch to prednisone 40 mg  - starting Bactrim SS q12 for PCP ppx  - please wean O2 as tolerated  - chest physiotherapy  - IS at bedside- able to inhale to 1250ccsp - home with O2 at least for short term  - pulmonary clinic follow up will set up post d/c -no need for albuterol nebs or DuoNEbs patient goes into Atrial fibrillation           -Discussed warfarin and Bactrim drug/drug interaction with pharmacist, patient has INR monitoring and will adjust warfarin dose as needed        Transient hypotension     -will give 500 cc fluid bolus.     - added midodrine for now   - family are okay with IV pressor support if necessary    - DNR/DNI     Thank you for allowing me to participate in the care of this patient.     Patient/Family are satisfied with care plan and all questions have been answered.  This document was prepared using Dragon voice recognition software and may include unintentional dictation errors.     Ottie Glazier, M.D.  Division of [place]

## 2018-11-29 NOTE — Progress Notes (Signed)
[facility] [place] NAME: Joseph Barton    MR#:  [id number]  DATE OF BIRTH:  11/11/1926  SUBJECTIVE:   Chief Complaint  Patient presents with  . Shortness of Breath   Patient is seen at the bedside, he appears alert and was oriented to person and place. Patient with increased SOB with hypoxia this am. His oxygen saturation dropped to 78% on 6L therefore was placed on non-rebreathe. Also noted with low bp 47-82N systolic.  Noted with episodes of afib with RVR.  REVIEW OF SYSTEMS:  ROS Constitutional: Negative for chills, fever, malaise/fatigue and weight loss.  HENT: Positive for hearing loss. Negative for congestion and sore throat.   Eyes: Negative for blurred vision and double vision.  Respiratory: Positive for shortness of breath and wheezing. Negative for cough, hemoptysis and sputum production.   Cardiovascular: Negative for chest pain, palpitations, orthopnea and leg swelling.       Irregular heart rhythm  Gastrointestinal: Negative for abdominal pain, diarrhea, nausea and vomiting.  Genitourinary: Negative for dysuria and urgency.  Musculoskeletal: Negative for myalgias.  Skin: Negative for rash.  Neurological: Negative for dizziness, sensory change, speech change, focal weakness and headaches.  Psychiatric/Behavioral: Negative for depression.  DRUG ALLERGIES:   Allergies  Allergen Reactions  . Atorvastatin   . Simvastatin    VITALS:  Blood pressure (!) 95/56, pulse 65, temperature 97.8 F (36.6 C), temperature source Oral, resp. rate 19, height 6\' 2"  (1.88 m), weight 79.3 kg, SpO2 (!) 88 %. PHYSICAL EXAMINATION:   GENERAL:  85 y.o.-year-old patient lying in the bed with no acute distress.  EYES: Pupils equal, round, reactive to light and accommodation. No scleral icterus. Extraocular muscles intact.  HEENT: Head atraumatic, normocephalic. Oropharynx and nasopharynx clear.  NECK:  Supple, no jugular venous distention. No  thyroid enlargement, no tenderness.  LUNGS: Decreased  breath sounds bilaterally, wheezing, rales,rhonchi or crepitation. No use of accessory muscles of respiration.  CARDIOVASCULAR: S1, S2 normal. Irregular, no rubs, or gallops.  ABDOMEN: Soft, nontender, nondistended. Bowel sounds present. No organomegaly or mass.  EXTREMITIES: No pedal edema, cyanosis, or clubbing.  NEUROLOGIC: Cranial nerves II through XII are intact. Muscle strength 5/5 in all extremities. Sensation intact. Gait not checked.  PSYCHIATRIC: The patient is alert and oriented x 3.  SKIN: No obvious rash, lesion, or ulcer.   DATA REVIEWED:  LABORATORY PANEL:  Male CBC Recent Labs  Lab 11/29/18 0357  WBC 11.9*  HGB 11.5*  HCT 35.5*  PLT 150   ------------------------------------------------------------------------------------------------------------------ Chemistries  Recent Labs  Lab 11/10/2018 0603 11/28/18 0357 11/29/18 1006  NA 137 135 137  K 4.4 4.7 4.1  CL 99 99 98  CO2 26 27 26   GLUCOSE 106* 153* 79  BUN 25* 28* 34*  CREATININE 1.35* 1.02 1.07  CALCIUM 8.5* 8.3* 8.8*  MG 1.8 2.0  --   AST 23  --   --   ALT 23  --   --   ALKPHOS 66  --   --   BILITOT 0.7  --   --    RADIOLOGY:  Dg Chest 1 View  Result Date: 11/29/2018 CLINICAL DATA:  Hypertension, CHF EXAM: CHEST  1 VIEW COMPARISON:  11/28/2018, 11/14/2018 FINDINGS: Diffuse bilateral interstitial and patchy alveolar airspace opacities unchanged compared with 11/23/2018. No left pleural effusion. Possible trace right pleural effusion. No pneumothorax. Stable cardiomegaly. No aggressive osseous lesion. IMPRESSION: 1. Bilateral interstitial and alveolar airspace opacities throughout bilateral lungs  concerning for multilobar pneumonia including atypical pneumonia. There is an element of underlying chronic interstitial lung disease. Electronically Signed   By: Kathreen Devoid   On: 11/29/2018 09:57   Dg Chest 1 View  Result Date: 11/28/2018 CLINICAL DATA:   Difficulty breathing. EXAM: CHEST  1 VIEW COMPARISON:  11/02/2018 FINDINGS: Heart size appears enlarged. Aortic atherosclerosis. Diffuse bilateral interstitial and airspace opacities identified throughout both lungs. When compared with the previous exam the aeration to the lungs appears unchanged. IMPRESSION: 1. No change in aeration a lungs compared with 12/01/2018. Electronically Signed   By: Kerby Moors M.D.   On: 11/28/2018 15:53   ASSESSMENT AND PLAN:   85 y.o. male with a known history of ischemic cardiomyopathy, atrial fibrillation on Coumadin, hyperlipidemia, chronic systolic heart failure, MI, CAD status post percutaneous coronary angioplasty, AAA, amiodarone-induced lung injury, and hypertension presenting with worsening shortness of breath.  1. Acute on chronic hypoxic respiratory failure - Hx of recent amiodarone induced lung injury also with CHF on chronic home oxygen at 2 L. Sats low this am placed on non-rebreathe attempting to wean. - Repeat chest x-ray shows Bilateral interstitial and alveolar airspace opacities throughout bilateral lungs  - ABGs shows respiratory alkalosis - Supplemental O2, goal sat 88-92%, weaning as tolerated - Continue Bactrim SS q12 for PCP ppx per Pulmonary - Duonebs d/cd  Due to afib - Continue incentive spirometry - Corticosteroids: pred 40 mg - PT to eval and assess continued oxygen need.  2. Acute on chronic diastolic Congestive Heart Failure-BNP remains elevated  Last Echo 10/23/18,  EF 25 to 30% - Beta-Blockade: Holding  Metoprolol  - Diuretics: Holding lasix due to hypotension   IV diureses >1L negative per day until approach euvolemia / worsening renal function. - Continue digoxin, will monitor renal function - Low salt diet   3. Elevated troponin -Likely demand ischemia - Improved - EKG reviewed as above  4. Chronic atrial fibrillation - noted with episodes of RVR - Resume home metoprolol 25 mg XL - Coagulated with Coumadin - INR  subtherapeutic - Coumadin management per pharmacy  5. Hypertension - Now hypotensive not requiring vasoactive drip - Holding Lasix and Metoprolol - Continue to monitor  6. DVT prophylaxis -on Coumadin   All the records are reviewed and case discussed with Care Management/Social Worker. Management plans discussed with the patient, family and they are in agreement.  CODE STATUS: DNR  TOTAL TIME TAKING CARE OF THIS PATIENT: 40 minutes.   More than 50% of the time was spent in counseling/coordination of care: YES  POSSIBLE D/C IN 1 DAYS, DEPENDING ON CLINICAL CONDITION.   on 11/29/2018 at 1:09 PM  This patient was staffed with Dr. Vianne Bulls, Lise Auer who personally evaluated patient, reviewed documentation and agreed with assessment and plan of care as above.  Rufina Falco, DNP, FNP-BC [facility] Nurse Practitioner   Between 7am to 6pm - Pager [phone]  After 6pm go to [email or web address] - password [email or web address] [facility]  [facility] [place]  Office  [phone]  CC: Primary care physician; Jerrol Banana., MD  Note: This dictation was prepared with Dragon dictation along with smaller phrase technology. Any transcriptional errors that result from this process are unintentional.

## 2018-11-29 NOTE — Care Management Important Message (Signed)
Important Message  Patient Details  Name: RONDY KRUPINSKI MRN: [id number] Date of Birth: 01-27-1927   Medicare Important Message Given:       Darius Bump Willard Farquharson 11/29/2018, 11:27 AM

## 2018-11-29 NOTE — Progress Notes (Signed)
Patient's oxygen saturation dropped to 78% on 6L of oxygen. Respiratory called, non rebreather put on with oxygen saturation from 88-90%.Lung sounds-crackles. Blood pressure 97/64 with iv lasix scheduled. Dr. Vianne Bulls paged to notify.

## 2018-11-29 NOTE — Progress Notes (Signed)
Pt may need pressors for hypotension and cant give too too much IV fluids.case d/w Dr.Aleskov this AM.d/w RN,pt will go step down after shift change due to staffing issues.

## 2018-11-30 LAB — BASIC METABOLIC PANEL
Anion gap: 9 (ref 5–15)
BUN: 29 mg/dL — ABNORMAL HIGH (ref 8–23)
CO2: 25 mmol/L (ref 22–32)
Calcium: 8.1 mg/dL — ABNORMAL LOW (ref 8.9–10.3)
Chloride: 100 mmol/L (ref 98–111)
Creatinine, Ser: 1.05 mg/dL (ref 0.61–1.24)
GFR calc Af Amer: >60 mL/min (ref >60)
GFR calc non Af Amer: >60 mL/min (ref >60)
Glucose, Bld: 135 mg/dL — ABNORMAL HIGH (ref 70–99)
Potassium: 4.4 mmol/L (ref 3.5–5.1)
Sodium: 134 mmol/L — ABNORMAL LOW (ref 135–145)

## 2018-11-30 LAB — CBC WITH DIFFERENTIAL/PLATELET
Abs Immature Granulocytes: 0.12 10*3/uL — ABNORMAL HIGH (ref 0.00–0.07)
Basophils Absolute: 0 10*3/uL (ref 0.0–0.1)
Basophils Relative: 0 %
Eosinophils Absolute: 0 10*3/uL (ref 0.0–0.5)
Eosinophils Relative: 0 %
HCT: 34.8 % — ABNORMAL LOW (ref 39.0–52.0)
Hemoglobin: 11.6 g/dL — ABNORMAL LOW (ref 13.0–17.0)
Immature Granulocytes: 1 %
Lymphocytes Relative: 6 %
Lymphs Abs: 0.6 10*3/uL — ABNORMAL LOW (ref 0.7–4.0)
MCH: 30.4 pg (ref 26.0–34.0)
MCHC: 33.3 g/dL (ref 30.0–36.0)
MCV: 91.1 fL (ref 80.0–100.0)
Monocytes Absolute: 0.3 10*3/uL (ref 0.1–1.0)
Monocytes Relative: 3 %
Neutro Abs: 9.3 10*3/uL — ABNORMAL HIGH (ref 1.7–7.7)
Neutrophils Relative %: 90 %
Platelets: 142 10*3/uL — ABNORMAL LOW (ref 150–400)
RBC: 3.82 MIL/uL — ABNORMAL LOW (ref 4.22–5.81)
RDW: 16 % — ABNORMAL HIGH (ref 11.5–15.5)
Smear Review: NORMAL
WBC: 10.4 10*3/uL (ref 4.0–10.5)
nRBC: 0 % (ref 0.0–0.2)

## 2018-11-30 LAB — PROTIME-INR
INR: 2.1 — ABNORMAL HIGH (ref 0.8–1.2)
Prothrombin Time: 23 seconds — ABNORMAL HIGH (ref 11.4–15.2)

## 2018-11-30 MED ORDER — WARFARIN SODIUM 1 MG PO TABS
1.5000 mg | ORAL_TABLET | Freq: Once | ORAL | Status: AC
  Administered 2018-11-30: 1.5 mg via ORAL
  Filled 2018-11-30: qty 1

## 2018-11-30 NOTE — Consult Note (Signed)
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  Patient was discharged last month from the hospital with 3 to 4 L nasal cannula at rest due to worsening pulmonary fibrosis, he was taken off of amiodarone and has been in his usual state of health and able to participate in most ADLs on his own with the use of oxygen daily.  He reports that he was found to be hypoxic by home health nurse and was asked to go to the emergency department.  While in the hospital he was diuresed and had transient hypotension with worsening mental status and hypoxemia.  Critical care consultation was placed for upgraded care to MICU and management of acute on chronic hypoxemic respiratory failure with transient hypotension.  PAST MEDICAL HISTORY   Past Medical History:  Diagnosis Date  . Abdominal aortic aneurysm ([place]) 01-2004   4.7 x 4.7 cm.  Marland Kitchen CAD (coronary artery disease) 01/25/2004   a. 01/2004 Ant MI/DES to LAD;  b. 10/2011 Neg MV.  . Chronic systolic heart failure ([place])    a. 08/2016 Echo: EF 35%, nl RV fxn, mod to sev MR, mild to mod TR, mod biatrial enlargement.  . Hyperlipidemia   . Hypertension   . Ischemic cardiomyopathy    a. 08/2016 Echo: EF 35%.  . Moderate to Severe Mitral regurgitation    a.  08/2016 Echo: Mod-sev MR.  Marland Kitchen Permanent atrial fibrillation    a. Chronic coumadin (CHA2DS2VASc = 5).  . Pulmonary nodules    Noted on abdominal CT  . Statin intolerance      SURGICAL HISTORY   Past Surgical History:  Procedure Laterality Date  . ABDOMINAL AORTIC ANEURYSM REPAIR     12/02/2007 [facility]  . ABDOMINAL AORTIC ANEURYSM REPAIR  2006   [facility]   . CARDIAC CATHETERIZATION  2009   [facility]  . CORONARY ANGIOPLASTY WITH STENT PLACEMENT  2005   Cypher stent LAD   . Cypher stents to LAD     01/25/2004  . SKIN SURGERY  2016   [facility] skin cancer     FAMILY HISTORY   Family History  Problem Relation Age of Onset  . Heart attack Father 72       Died w/ Heart attack  . Diabetes Father   . Heart attack Brother   . Heart disease Brother   . Diabetes Sister   . Heart disease Sister      SOCIAL HISTORY   Social History   Tobacco Use  . Smoking status: Former Smoker    Last attempt to quit: 12/01/1987    Years since quitting: 31.0  . Smokeless tobacco: Never Used  Substance Use Topics  . Alcohol use: No  . Drug use: No     MEDICATIONS   Current Medication:  Current Facility-Administered Medications:  .  bisacodyl (DULCOLAX) EC tablet 5 mg, 5 mg, Oral, Daily PRN, Epifanio Lesches, MD .  digoxin (LANOXIN) tablet 0.0625 mg, 0.0625 mg, Oral, Daily, Epifanio Lesches, MD, 0.0625 mg at 11/30/18 0844 .  docusate sodium (COLACE)  capsule 200 mg, 200 mg, Oral, BID, Epifanio Lesches, MD, 200 mg at 11/30/18 0819 .  fluticasone (FLONASE) 50 MCG/ACT nasal spray 1 spray, 1 spray, Each Nare, Daily, Epifanio Lesches, MD, 1 spray at 11/30/18 0849 .  midodrine (PROAMATINE) tablet 10 mg, 10 mg, Oral, TID WC, Epifanio Lesches, MD, 10 mg at 11/30/18 1249 .  multivitamin with minerals tablet 1 tablet, 1 tablet, Oral, Daily, Epifanio Lesches, MD, 1 tablet at 11/30/18 0819 .  pantoprazole (PROTONIX) EC tablet 40 mg, 40 mg, Oral, Daily, Epifanio Lesches, MD, 40  mg at 11/30/18 0819 .  predniSONE (DELTASONE) tablet 40 mg, 40 mg, Oral, Q breakfast, Epifanio Lesches, MD, 40 mg at 11/30/18 0819 .  sulfamethoxazole-trimethoprim (BACTRIM) 400-80 MG per tablet 1 tablet, 1 tablet, Oral, Q12H, Epifanio Lesches, MD, 1 tablet at 11/30/18 0845 .  tamsulosin (FLOMAX) capsule 0.4 mg, 0.4 mg, Oral, Daily, Epifanio Lesches, MD, 0.4 mg at 11/30/18 0819 .  traZODone (DESYREL) tablet 50 mg, 50 mg, Oral, QHS PRN, Epifanio Lesches, MD, 50 mg at 11/28/18 2118 .  warfarin (COUMADIN) tablet 1.5 mg, 1.5 mg, Oral, ONCE-1800, Hallaji, Sheema M, RPH .  Warfarin - Pharmacist Dosing Inpatient, , Does not apply, q1800, Epifanio Lesches, MD    ALLERGIES   Atorvastatin and Simvastatin    REVIEW OF SYSTEMS    10 point ROS is negative except for nasal rhinitis  PHYSICAL EXAMINATION   Vitals:   11/30/18 1400 11/30/18 1500  BP: 90/65 94/66  Pulse: (!) 51 (!) 54  Resp: (!) 23 (!) 27  Temp: 97.8 F (36.6 C)   SpO2: 93% 95%    GENERAL: No acute distress on high flow nasal cannula HEAD: Normocephalic, atraumatic.  EYES: Pupils equal, round, reactive to light.  No scleral icterus.  MOUTH: Moist mucosal membrane. NECK: Supple. No thyromegaly. No nodules. No JVD.  PULMONARY: Velcro crepitations diffuse bilateral CARDIOVASCULAR: S1 and S2. Regular rate and rhythm. No murmurs, rubs, or gallops.  GASTROINTESTINAL: Soft, nontender, non-distended. No masses. Positive bowel sounds. No hepatosplenomegaly.  MUSCULOSKELETAL: No swelling, clubbing, or edema.  NEUROLOGIC: Mild distress due to acute illness SKIN:intact,warm,dry   LABS AND IMAGING       LAB RESULTS: Recent Labs  Lab 11/28/18 0357 11/29/18 1006 11/30/18 0418  NA 135 137 134*  K 4.7 4.1 4.4  CL 99 98 100  CO2 27 26 25   BUN 28* 34* 29*  CREATININE 1.02 1.07 1.05  GLUCOSE 153* 79 135*   Recent Labs  Lab 11/28/18 0357 11/29/18 0357 11/30/18 0418  HGB 11.6* 11.5* 11.6*  HCT 35.9*  35.5* 34.8*  WBC 9.7 11.9* 10.4  PLT 151 150 142*     IMAGING RESULTS: No results found.    ASSESSMENT AND PLAN     Acuteon chronichypoxemic respiratory failure -due toamiodarone induced pulmonary toxicity with severe systolic CHF  -s/p IV solumedrol  - will switch to prednisone 40 mg  - starting Bactrim SS q12 for PCP ppx  - please wean O2 as tolerated  - chest physiotherapy  - IS at bedside- able to inhale to 1250ccsp - home with O2 at least for short term  - pulmonary clinic follow up will set up post d/c -no need for albuterol nebs or DuoNEbs patient goes into Atrial fibrillation  -Discussed warfarin and Bactrim drug/drug interaction with pharmacist, patient has INR monitoring and will adjust warfarin dose as needed        Acute on chronic decompensated systolic CHF with EF 27%     -  Currently on midodrine 10 mg 3 times daily    -Duiresed over 3.5 L thus far    - transient hypotension so Lasix will be DC'd for now   - family are okay with IV pressor support if necessary    - DNR/DNI     GI/Nutrition GI PROPHYLAXIS as indicated DIET-->TF's as tolerated Constipation protocol as indicated  ENDO - ICU hypoglycemic\Hyperglycemia protocol -check FSBS per protocol   ELECTROLYTES -follow labs as needed -replace as needed -pharmacy consultation   DVT/GI PRX ordered -SCDs  TRANSFUSIONS AS NEEDED MONITOR FSBS ASSESS the need for LABS as needed   Critical care provider statement:    Critical care time (minutes):  32   Critical care time was exclusive of:  Separately billable procedures and treating other patients   Critical care was necessary to treat or prevent imminent or life-threatening deterioration of the following conditions:   Acute on chronic hypoxemic respiratory failure, transient hypotension, acute on chronic systolic CHF exacerbation,  atrial fibrillation, multiple comorbid conditions   Critical care was time spent personally by me on the following activities:  Development of treatment plan with patient or surrogate, discussions with consultants, evaluation of patient's response to treatment, examination of patient, obtaining history from patient or surrogate, ordering and performing treatments and interventions, ordering and review of laboratory studies and re-evaluation of patient's condition.  I assumed direction of critical care for this patient from another provider in my specialty: no    This document was prepared using Dragon voice recognition software and may include unintentional dictation errors.    Ottie Glazier, M.D.  Division of [facility]

## 2018-11-30 NOTE — Progress Notes (Signed)
Assisted tele visit to patient with family.  Shaday Rayborn P, RN  

## 2018-11-30 NOTE — Plan of Care (Signed)
PT a&o x 4. Slow weaning of o2.  Heart rate controlled. VSS.  BP remains soft.  Family updated

## 2018-11-30 NOTE — Progress Notes (Signed)
PT Cancellation Note  Patient Details Name: SHELLY SHOULTZ MRN: [id number] DOB: 1926-08-17   Cancelled Treatment:    Reason Eval/Treat Not Completed: Other (comment)(PT spoke with RN prior to session, RN informed PT they are attempting to wean down pt's oxygen at this time. RN requesting PT to hold exertional activity until pt is more appropriate. )  Lieutenant Diego PT, DPT 1:57 PM,11/30/18 [phone]

## 2018-11-30 NOTE — Consult Note (Addendum)
ANTICOAGULATION CONSULT NOTE - Initial Consult  Pharmacy Consult for Warfarin Indication: atrial fibrillation  Allergies  Allergen Reactions  . Atorvastatin   . Simvastatin     Patient Measurements: Height: 6\' 2"  (188 cm) Weight: 174 lb 13.2 oz (79.3 kg) IBW/kg (Calculated) : 82.2  Vital Signs: Temp: 97.7 F (36.5 C) (05/30 0200) Temp Source: Oral (05/30 0200) BP: 79/49 (05/30 0500) Pulse Rate: 61 (05/30 0500)  Labs: Recent Labs    11/11/2018 1248  11/28/18 0357 11/29/18 0357 11/29/18 1006 11/30/18 0418  HGB  --    < > 11.6* 11.5*  --  11.6*  HCT  --   --  35.9* 35.5*  --  34.8*  PLT  --   --  151 150  --  142*  LABPROT  --   --  16.3* 20.4*  --  23.0*  INR  --   --  1.3* 1.8*  --  2.1*  CREATININE  --   --  1.02  --  1.07 1.05  TROPONINI <0.03  --   --   --   --   --    < > = values in this interval not displayed.    Estimated Creatinine Clearance: 52.4 mL/min (by C-G formula based on SCr of 1.05 mg/dL).   Medical History: Past Medical History:  Diagnosis Date  . Abdominal aortic aneurysm ([place]) 01-2004   4.7 x 4.7 cm.  Marland Kitchen CAD (coronary artery disease) 01/25/2004   a. 01/2004 Ant MI/DES to LAD;  b. 10/2011 Neg MV.  . Chronic systolic heart failure ([place])    a. 08/2016 Echo: EF 35%, nl RV fxn, mod to sev MR, mild to mod TR, mod biatrial enlargement.  . Hyperlipidemia   . Hypertension   . Ischemic cardiomyopathy    a. 08/2016 Echo: EF 35%.  . Moderate to Severe Mitral regurgitation    a. 08/2016 Echo: Mod-sev MR.  Marland Kitchen Permanent atrial fibrillation    a. Chronic coumadin (CHA2DS2VASc = 5).  . Pulmonary nodules    Noted on abdominal CT  . Statin intolerance     Medications:  PTA dose 1mg  Warfarin qd (5/26 note from [facility] - primary care provider to increase dose to 2mg  qd)  Assessment: Permanent A Fib with CHA2DS2VASc score of 5.    INR 1.8 - Progressing cautiously because of drug interaction with Bactrim - will likely be in therapeutic range  tomorrow  DATE INR DOSE 5/27 1.4 2mg  5/28 1.3 2mg  5/29 1.8 2mg  5/30 2.1   Goal of Therapy:  INR 2-3 Monitor platelets by anticoagulation protocol: Yes   Plan:  INR now therapeutic. Will order warfarin 1.5mg  x 1 tonight due to fast increase in INR and Drug-drug interaction with Bactrim.    Will check INR daily while on antibiotics and CBC a minimum of every 3 days while on warfarin.   Pernell Dupre, PharmD, BCPS Clinical Pharmacist 11/30/2018 6:18 AM

## 2018-11-30 NOTE — Progress Notes (Signed)
[facility] [place] NAME: Joseph Barton    MR#:  [id number]  DATE OF BIRTH:  1926/08/12  SUBJECTIVE:   Chief Complaint  Patient presents with  . Shortness of Breath   Patient on high flow oxygen in in the ICU.  REVIEW OF SYSTEMS:  ROS Constitutional: Negative for chills, fever, malaise/fatigue and weight loss.  HENT: Positive for hearing loss. Negative for congestion and sore throat.   Eyes: Negative for blurred vision and double vision.  Respiratory: Positive for shortness of breath and wheezing. Negative for cough, hemoptysis and sputum production.   Cardiovascular: Negative for chest pain, palpitations, orthopnea and leg swelling.       Irregular heart rhythm  Gastrointestinal: Negative for abdominal pain, diarrhea, nausea and vomiting.  Genitourinary: Negative for dysuria and urgency.  Musculoskeletal: Negative for myalgias.  Skin: Negative for rash.  Neurological: Negative for dizziness, sensory change, speech change, focal weakness and headaches.  Psychiatric/Behavioral: Negative for depression.  DRUG ALLERGIES:   Allergies  Allergen Reactions  . Atorvastatin   . Simvastatin    VITALS:  Blood pressure 94/66, pulse (!) 54, temperature 97.8 F (36.6 C), temperature source Oral, resp. rate (!) 27, height 6\' 2"  (1.88 m), weight 79.3 kg, SpO2 95 %. PHYSICAL EXAMINATION:   GENERAL:  85 y.o.-year-old patient lying in the bed with no acute distress.  EYES: Pupils equal, round, reactive to light and accommodation. No scleral icterus. Extraocular muscles intact.  HEENT: Head atraumatic, normocephalic. Oropharynx and nasopharynx clear.  NECK:  Supple, no jugular venous distention. No thyroid enlargement, no tenderness.  LUNGS: Crackles bilaterally  cARDIOVASCULAR: S1, S2 normal. Irregular, no rubs, or gallops.  ABDOMEN: Soft, nontender, nondistended. Bowel sounds present. No organomegaly or mass.  EXTREMITIES: No pedal edema,  cyanosis, or clubbing.  NEUROLOGIC: Cranial nerves II through XII are intact. Muscle strength 5/5 in all extremities. Sensation intact. Gait not checked.  PSYCHIATRIC: The patient is alert and oriented x 3.  SKIN: No obvious rash, lesion, or ulcer.   DATA REVIEWED:  LABORATORY PANEL:  Male CBC Recent Labs  Lab 11/30/18 0418  WBC 10.4  HGB 11.6*  HCT 34.8*  PLT 142*   ------------------------------------------------------------------------------------------------------------------ Chemistries  Recent Labs  Lab 11/30/2018 0603 11/28/18 0357  11/30/18 0418  NA 137 135   < > 134*  K 4.4 4.7   < > 4.4  CL 99 99   < > 100  CO2 26 27   < > 25  GLUCOSE 106* 153*   < > 135*  BUN 25* 28*   < > 29*  CREATININE 1.35* 1.02   < > 1.05  CALCIUM 8.5* 8.3*   < > 8.1*  MG 1.8 2.0  --   --   AST 23  --   --   --   ALT 23  --   --   --   ALKPHOS 66  --   --   --   BILITOT 0.7  --   --   --    < > = values in this interval not displayed.   RADIOLOGY:  Dg Chest 1 View  Result Date: 11/29/2018 CLINICAL DATA:  Hypertension, CHF EXAM: CHEST  1 VIEW COMPARISON:  11/28/2018, 11/21/2018 FINDINGS: Diffuse bilateral interstitial and patchy alveolar airspace opacities unchanged compared with 11/07/2018. No left pleural effusion. Possible trace right pleural effusion. No pneumothorax. Stable cardiomegaly. No aggressive osseous lesion. IMPRESSION: 1. Bilateral interstitial and alveolar airspace opacities throughout  bilateral lungs concerning for multilobar pneumonia including atypical pneumonia. There is an element of underlying chronic interstitial lung disease. Electronically Signed   By: Kathreen Devoid   On: 11/29/2018 09:57   ASSESSMENT AND PLAN:   85 y.o. male with a known history of ischemic cardiomyopathy, atrial fibrillation on Coumadin, hyperlipidemia, chronic systolic heart failure, MI, CAD status post percutaneous coronary angioplasty, AAA, amiodarone-induced lung injury, and hypertension  presenting with worsening shortness of breath.  1. Acute on chronic hypoxic respiratory failure - Hx of recent amiodarone induced lung injury also with CHF on chronic home oxygen at 2 L.  Try to wean down oxygen  2. Acute on chronic diastolic Congestive Heart Failure-BNP remains elevated  Last Echo 10/23/18,  EF 25 to 30% -Due to low blood pressure IV Lasix on hold Continue digoxin  3. Elevated troponin -Likely demand ischemia - Improved - EKG reviewed as above  4. Chronic atrial fibrillation - noted with episodes of RVR -Hold antihypertension - Continue Coumadin - INR subtherapeutic - Coumadin management per pharmacy  5. Hypertension -blood pressure on hold  6. DVT prophylaxis -on Coumadin   All the records are reviewed and case discussed with Care Management/Social Worker. Management plans discussed with the patient, family and they are in agreement.  CODE STATUS: DNR  TOTAL TIME TAKING CARE OF THIS PATIENT: 36min.   More than 50% of the time was spent in counseling/coordination of care: YES    on 11/30/2018 at 3:55 PM    Between 7am to 6pm - Pager - [phone]  After 6pm go to [email or web address] - password [email or web address] [facility]  [employer] [facility]  Office  [phone]  CC: Primary care physician; Jerrol Banana., MD  Note: This dictation was prepared with Dragon dictation along with smaller phrase technology. Any transcriptional errors that result from this process are unintentional.

## 2018-11-30 NOTE — Progress Notes (Signed)
Metaneb held due to patient just eating breakfast.

## 2018-12-01 ENCOUNTER — Inpatient Hospital Stay: Payer: Medicare Other

## 2018-12-01 LAB — PROTIME-INR
INR: 2.4 — ABNORMAL HIGH (ref 0.8–1.2)
Prothrombin Time: 25.7 seconds — ABNORMAL HIGH (ref 11.4–15.2)

## 2018-12-01 MED ORDER — GUAIFENESIN-DM 100-10 MG/5ML PO SYRP
5.0000 mL | ORAL_SOLUTION | ORAL | Status: DC | PRN
  Administered 2018-12-01 (×2): 5 mL via ORAL
  Filled 2018-12-01 (×2): qty 5

## 2018-12-01 MED ORDER — HYDRALAZINE HCL 20 MG/ML IJ SOLN
10.0000 mg | Freq: Once | INTRAMUSCULAR | Status: AC
  Administered 2018-12-01: 10 mg via INTRAVENOUS
  Filled 2018-12-01: qty 1

## 2018-12-01 MED ORDER — WARFARIN SODIUM 1 MG PO TABS
1.0000 mg | ORAL_TABLET | Freq: Once | ORAL | Status: AC
  Administered 2018-12-01: 1 mg via ORAL
  Filled 2018-12-01: qty 1

## 2018-12-01 MED ORDER — MIDODRINE HCL 5 MG PO TABS
2.5000 mg | ORAL_TABLET | Freq: Three times a day (TID) | ORAL | Status: DC
  Filled 2018-12-01: qty 0.5

## 2018-12-01 MED ORDER — MORPHINE SULFATE (PF) 2 MG/ML IV SOLN
1.0000 mg | Freq: Once | INTRAVENOUS | Status: AC
  Administered 2018-12-01: 1 mg via INTRAVENOUS
  Filled 2018-12-01: qty 1

## 2018-12-01 NOTE — Evaluation (Signed)
Physical Therapy Evaluation Patient Details Name: Joseph Barton MRN: [id number] DOB: Jul 26, 1926 Today's Date: 12/01/2018   History of Present Illness  85 y.o. male  with past medical history of chronic systolic heart failure, ischemic cardiomyopathy with EF 25-30%, CAD, amiodarone-induced lung injury, HTN, HLD, atrial fibrillation on coumadin, pulmonary nodules, moderate to severe mitral regurgitation, statin intolerance, AAA s/p repair admitted on 11/13/2018 with shortness of breath. Hospital admission for acute on chronic hypoxic respiratory failure secondary to amiodarone induced lung injury with underlying chronic CHF.     Clinical Impression  Patient sleeping, but woken with verbal and tactile cues. Pt able to provide some PLOF information, pt was here about 1 month ago and chart reviewed indicated further PLOF information. Pt reported using RW to ambulate, and that his wife/neighbor perform errands, grocery shopping, cooking, etc. Denies any falls in the last 6 months.  Pt was able to perform SLR and heel slide independently bilaterally. Supine to sit with CGA but no physical assist needed. Pt able to sit EOB for at least 10 minutes, feet supported and at least 1 UE supported at a time, varied from CGA-supervision. Vitals monitored throughout, intermittent elevated HR (115 max), and spO2 86% but resolved >90% remaining of time. BP assessed x3 RUE and LUE. Pt showed consistent elevated diastolic BP, last reading 160/127, RN notified and agreeable for pt to return to supine. When returned to supine BP 100/70.  Overall the patient demonstrated deficits (see "PT Problem List") that impede the patient's functional abilities, safety, and mobility and would benefit from skilled PT intervention. Recommendation based on limited mobility STR due to acute decline (significantly elevated O2 needs, rest breaks) pending pt progress. Pt would benefit from further assessment of mobility.     Follow Up  Recommendations SNF    Equipment Recommendations  Other (comment)(TBD)    Recommendations for Other Services       Precautions / Restrictions Precautions Precautions: Fall Precaution Comments: O2, BP Restrictions Weight Bearing Restrictions: No      Mobility  Bed Mobility Overal bed mobility: Needs Assistance Bed Mobility: Supine to Sit;Sit to Supine     Supine to sit: Supervision Sit to supine: Supervision      Transfers                 General transfer comment: deferred due to elevated BP in sitting  Ambulation/Gait                Stairs            Wheelchair Mobility    Modified Rankin (Stroke Patients Only)       Balance Overall balance assessment: Needs assistance Sitting-balance support: Feet supported Sitting balance-Leahy Scale: Fair                                       Pertinent Vitals/Pain Pain Assessment: No/denies pain    Home Living Family/patient expects to be discharged to:: Private residence Living Arrangements: Spouse/significant other;Non-relatives/Friends Available Help at Discharge: Family Type of Home: House Home Access: Stairs to enter Entrance Stairs-Rails: None Entrance Stairs-Number of Steps: 1 Home Layout: One level Home Equipment: Environmental consultant - 2 wheels      Prior Function Level of Independence: Needs assistance   Gait / Transfers Assistance Needed: ambulates with RW  ADL's / Homemaking Assistance Needed: wife and neighbor assists with homemaking, cooking, cleaning. Stated his wife helps him  do whatever he needs        Hand Dominance   Dominant Hand: Left    Extremity/Trunk Assessment   Upper Extremity Assessment Upper Extremity Assessment: Generalized weakness    Lower Extremity Assessment Lower Extremity Assessment: Generalized weakness    Cervical / Trunk Assessment Cervical / Trunk Assessment: Normal  Communication   Communication: HOH  Cognition Arousal/Alertness:  Awake/alert Behavior During Therapy: WFL for tasks assessed/performed Overall Cognitive Status: Within Functional Limits for tasks assessed                                        General Comments      Exercises Other Exercises Other Exercises: pt able to perform SLR and heel slide independently in supine Other Exercises: pt able to utilize urinal in supine when handed it by PT   Assessment/Plan    PT Assessment Patient needs continued PT services  PT Problem List Decreased strength;Decreased mobility;Decreased activity tolerance;Decreased balance;Decreased knowledge of precautions       PT Treatment Interventions DME instruction;Functional mobility training;Balance training;Patient/family education;Gait training;Therapeutic activities;Neuromuscular re-education;Stair training;Therapeutic exercise    PT Goals (Current goals can be found in the Care Plan section)  Acute Rehab PT Goals Patient Stated Goal: to go home PT Goal Formulation: With patient Time For Goal Achievement: 12/15/18 Potential to Achieve Goals: Good    Frequency Min 2X/week   Barriers to discharge        Co-evaluation               AM-PAC PT "6 Clicks" Mobility  Outcome Measure Help needed turning from your back to your side while in a flat bed without using bedrails?: A Little Help needed moving from lying on your back to sitting on the side of a flat bed without using bedrails?: A Little Help needed moving to and from a bed to a chair (including a wheelchair)?: A Little Help needed standing up from a chair using your arms (e.g., wheelchair or bedside chair)?: A Little Help needed to walk in hospital room?: A Lot Help needed climbing 3-5 steps with a railing? : Total 6 Click Score: 15    End of Session Equipment Utilized During Treatment: Gait belt;Oxygen(HFNC) Activity Tolerance: Treatment limited secondary to medical complications (Comment);Patient tolerated treatment  well Patient left: in bed;with call bell/phone within reach Nurse Communication: Mobility status PT Visit Diagnosis: Other abnormalities of gait and mobility (R26.89);Muscle weakness (generalized) (M62.81)    Time: 1224-8250 PT Time Calculation (min) (ACUTE ONLY): 42 min   Charges:   PT Evaluation $PT Eval Moderate Complexity: 1 Mod PT Treatments $Therapeutic Exercise: 8-22 mins $Neuromuscular Re-education: 8-22 mins       Lieutenant Diego PT, DPT 3:44 PM,12/01/18 [phone]

## 2018-12-01 NOTE — Consult Note (Addendum)
ANTICOAGULATION CONSULT NOTE - Initial Consult  Pharmacy Consult for Warfarin Indication: atrial fibrillation  Allergies  Allergen Reactions  . Atorvastatin   . Simvastatin     Patient Measurements: Height: 6\' 2"  (188 cm) Weight: 174 lb 13.2 oz (79.3 kg) IBW/kg (Calculated) : 82.2  Vital Signs: Temp: 97.7 F (36.5 C) (05/31 0040) Temp Source: Oral (05/31 0040) BP: 93/66 (05/31 0600) Pulse Rate: 71 (05/31 0600)  Labs: Recent Labs    11/29/18 0357 11/29/18 1006 11/30/18 0418 12/01/18 0529  HGB 11.5*  --  11.6*  --   HCT 35.5*  --  34.8*  --   PLT 150  --  142*  --   LABPROT 20.4*  --  23.0* 25.7*  INR 1.8*  --  2.1* 2.4*  CREATININE  --  1.07 1.05  --     Estimated Creatinine Clearance: 52.4 mL/min (by C-G formula based on SCr of 1.05 mg/dL).   Medical History: Past Medical History:  Diagnosis Date  . Abdominal aortic aneurysm ([place]) 01-2004   4.7 x 4.7 cm.  Marland Kitchen CAD (coronary artery disease) 01/25/2004   a. 01/2004 Ant MI/DES to LAD;  b. 10/2011 Neg MV.  . Chronic systolic heart failure ([place])    a. 08/2016 Echo: EF 35%, nl RV fxn, mod to sev MR, mild to mod TR, mod biatrial enlargement.  . Hyperlipidemia   . Hypertension   . Ischemic cardiomyopathy    a. 08/2016 Echo: EF 35%.  . Moderate to Severe Mitral regurgitation    a. 08/2016 Echo: Mod-sev MR.  Marland Kitchen Permanent atrial fibrillation    a. Chronic coumadin (CHA2DS2VASc = 5).  . Pulmonary nodules    Noted on abdominal CT  . Statin intolerance     Medications:  PTA dose 1mg  Warfarin qd (5/26 note from [facility] - primary care provider to increase dose to 2mg  qd)  Assessment: Permanent A Fib with CHA2DS2VASc score of 5.    Progressing cautiously because of drug interaction with Bactrim  DATE INR DOSE 5/27 1.4 2mg  5/28 1.3 2mg  5/29 1.8 2mg  5/30 2.1 1.5mg  5/31 2.4    Goal of Therapy:  INR 2-3 Monitor platelets by anticoagulation protocol: Yes   Plan:  INR remains therapeutic but continues to  increase. Will order warfarin 1mg  x 1 dose tonight due to increase of 0.3 x 2 days  and Drug-drug interaction with Bactrim.    Will check INR daily while on antibiotics and CBC a minimum of every 3 days while on warfarin.   Pernell Dupre, PharmD, BCPS Clinical Pharmacist 12/01/2018 6:52 AM

## 2018-12-01 NOTE — Progress Notes (Signed)
Patient c/o dry cough. PRN order for Robitussin DM entered per standing order.

## 2018-12-01 NOTE — Progress Notes (Signed)
[place] [facility] [place] NAME: Joseph Barton    MR#:  [id number]  DATE OF BIRTH:  1926-08-01  SUBJECTIVE:   Chief Complaint  Patient presents with  . Shortness of Breath   Patient continues to require high flow oxygen  REVIEW OF SYSTEMS:  ROS Constitutional: Negative for chills, fever, malaise/fatigue and weight loss.  HENT: Positive for hearing loss. Negative for congestion and sore throat.   Eyes: Negative for blurred vision and double vision.  Respiratory: Positive for shortness of breath and wheezing. Negative for cough, hemoptysis and sputum production.   Cardiovascular: Negative for chest pain, palpitations, orthopnea and leg swelling.       Irregular heart rhythm  Gastrointestinal: Negative for abdominal pain, diarrhea, nausea and vomiting.  Genitourinary: Negative for dysuria and urgency.  Musculoskeletal: Negative for myalgias.  Skin: Negative for rash.  Neurological: Negative for dizziness, sensory change, speech change, focal weakness and headaches.  Psychiatric/Behavioral: Negative for depression.  DRUG ALLERGIES:   Allergies  Allergen Reactions  . Atorvastatin   . Simvastatin    VITALS:  Blood pressure (!) 89/61, pulse 70, temperature 97.8 F (36.6 C), temperature source Oral, resp. rate (!) 22, height 6\' 2"  (1.88 m), weight 79.3 kg, SpO2 90 %. PHYSICAL EXAMINATION:   GENERAL:  85 y.o.-year-old patient lying in the bed with no acute distress.  EYES: Pupils equal, round, reactive to light and accommodation. No scleral icterus. Extraocular muscles intact.  HEENT: Head atraumatic, normocephalic. Oropharynx and nasopharynx clear.  NECK:  Supple, no jugular venous distention. No thyroid enlargement, no tenderness.  LUNGS: Crackles bilaterally  cARDIOVASCULAR: S1, S2 normal. Irregular, no rubs, or gallops.  ABDOMEN: Soft, nontender, nondistended. Bowel sounds present. No organomegaly or mass.  EXTREMITIES: No pedal edema,  cyanosis, or clubbing.  NEUROLOGIC: Cranial nerves II through XII are intact. Muscle strength 5/5 in all extremities. Sensation intact. Gait not checked.  PSYCHIATRIC: The patient is alert and oriented x 3.  SKIN: No obvious rash, lesion, or ulcer.   DATA REVIEWED:  LABORATORY PANEL:  Male CBC Recent Labs  Lab 11/30/18 0418  WBC 10.4  HGB 11.6*  HCT 34.8*  PLT 142*   ------------------------------------------------------------------------------------------------------------------ Chemistries  Recent Labs  Lab 11/17/2018 0603 11/28/18 0357  11/30/18 0418  NA 137 135   < > 134*  K 4.4 4.7   < > 4.4  CL 99 99   < > 100  CO2 26 27   < > 25  GLUCOSE 106* 153*   < > 135*  BUN 25* 28*   < > 29*  CREATININE 1.35* 1.02   < > 1.05  CALCIUM 8.5* 8.3*   < > 8.1*  MG 1.8 2.0  --   --   AST 23  --   --   --   ALT 23  --   --   --   ALKPHOS 66  --   --   --   BILITOT 0.7  --   --   --    < > = values in this interval not displayed.   RADIOLOGY:  No results found. ASSESSMENT AND PLAN:   85 y.o. male with a known history of ischemic cardiomyopathy, atrial fibrillation on Coumadin, hyperlipidemia, chronic systolic heart failure, MI, CAD status post percutaneous coronary angioplasty, AAA, amiodarone-induced lung injury, and hypertension presenting with worsening shortness of breath.  1. Acute on chronic hypoxic respiratory failure - felt to be due to amiodarone induced lung  injury also with CHF on chronic home oxygen at 2 L.  Try to wean down oxygen  2. Acute on chronic diastolic Congestive Heart Failure-BNP remains elevated  Last Echo 10/23/18,  EF 25 to 30% -Lasix on hold due to low blood pressure Continue digoxin  3. Elevated troponin -Likely demand ischemia - Improved - EKG reviewed as above  4. Chronic atrial fibrillation - noted with episodes of RVR -Hold antihypertension - Continue Coumadin  - Coumadin management per pharmacy  5. Hypertension -blood pressure on  hold  6. DVT prophylaxis -on Coumadin   All the records are reviewed and case discussed with Care Management/Social Worker. Management plans discussed with the patient, family and they are in agreement.  CODE STATUS: DNR  TOTAL TIME TAKING CARE OF THIS PATIENT: 29min.      on 12/01/2018 at 2:59 PM    Between 7am to 6pm - Pager - [phone]  After 6pm go to [email or web address] - password [email or web address]  [facility] [place] [facility]  Office  [phone]  CC: Primary care physician; Jerrol Banana., MD  Note: This dictation was prepared with Dragon dictation along with smaller phrase technology. Any transcriptional errors that result from this process are unintentional.

## 2018-12-01 NOTE — Progress Notes (Signed)
CRITICAL CARE NOTE       SUBJECTIVE FINDINGS & SIGNIFICANT EVENTS   Patient remains critically ill Prognosis is guarded  Patient has been working well with RT to use metaneb, IS and Acapella device.  He has been transitioned to nasal canula.   PAST MEDICAL HISTORY   Past Medical History:  Diagnosis Date  . Abdominal aortic aneurysm ([place]) 01-2004   4.7 x 4.7 cm.  Marland Kitchen CAD (coronary artery disease) 01/25/2004   a. 01/2004 Ant MI/DES to LAD;  b. 10/2011 Neg MV.  . Chronic systolic heart failure ([place])    a. 08/2016 Echo: EF 35%, nl RV fxn, mod to sev MR, mild to mod TR, mod biatrial enlargement.  . Hyperlipidemia   . Hypertension   . Ischemic cardiomyopathy    a. 08/2016 Echo: EF 35%.  . Moderate to Severe Mitral regurgitation    a. 08/2016 Echo: Mod-sev MR.  Marland Kitchen Permanent atrial fibrillation    a. Chronic coumadin (CHA2DS2VASc = 5).  . Pulmonary nodules    Noted on abdominal CT  . Statin intolerance      SURGICAL HISTORY   Past Surgical History:  Procedure Laterality Date  . ABDOMINAL AORTIC ANEURYSM REPAIR     12/02/2007 [facility]  . ABDOMINAL AORTIC ANEURYSM REPAIR  2006   [facility]   . CARDIAC CATHETERIZATION  2009   [facility]  . CORONARY ANGIOPLASTY WITH STENT PLACEMENT  2005   Cypher stent LAD   . Cypher stents to LAD     01/25/2004  . SKIN SURGERY  2016   [facility] skin cancer     FAMILY HISTORY   Family History  Problem Relation Age of Onset  . Heart attack Father 55       Died w/ Heart attack  . Diabetes Father   . Heart attack Brother   . Heart disease Brother   . Diabetes Sister   . Heart disease Sister      SOCIAL HISTORY   Social History   Tobacco Use  . Smoking status: Former Smoker    Last attempt to quit: 12/01/1987    Years since quitting: 31.0  . Smokeless tobacco: Never  Used  Substance Use Topics  . Alcohol use: No  . Drug use: No     MEDICATIONS   Current Medication:  Current Facility-Administered Medications:  .  bisacodyl (DULCOLAX) EC tablet 5 mg, 5 mg, Oral, Daily PRN, Epifanio Lesches, MD .  digoxin (LANOXIN) tablet 0.0625 mg, 0.0625 mg, Oral, Daily, Epifanio Lesches, MD, 0.0625 mg at 11/30/18 0844 .  docusate sodium (COLACE) capsule 200 mg, 200 mg, Oral, BID, Epifanio Lesches, MD, 200 mg at 11/30/18 2129 .  fluticasone (FLONASE) 50 MCG/ACT nasal spray 1 spray, 1 spray, Each Nare, Daily, Epifanio Lesches, MD, 1 spray at 11/30/18 0849 .  guaiFENesin-dextromethorphan (ROBITUSSIN DM) 100-10 MG/5ML syrup 5 mL, 5 mL, Oral, Q4H PRN, Epifanio Lesches, MD, 5 mL at 12/01/18 0353 .  midodrine (PROAMATINE) tablet 10 mg, 10 mg, Oral, TID WC, Epifanio Lesches, MD, 10 mg at 11/30/18 1731 .  multivitamin with minerals tablet 1 tablet, 1 tablet, Oral, Daily, Epifanio Lesches, MD, 1 tablet at 11/30/18 0819 .  pantoprazole (PROTONIX) EC tablet 40 mg, 40 mg, Oral, Daily, Epifanio Lesches, MD, 40 mg at 11/30/18 0819 .  predniSONE (DELTASONE) tablet 40 mg, 40 mg, Oral, Q breakfast, Epifanio Lesches, MD, 40 mg at 11/30/18 0819 .  sulfamethoxazole-trimethoprim (BACTRIM) 400-80 MG per tablet 1 tablet, 1 tablet, Oral, Q12H, Konidena,  Snehalatha, MD, 1 tablet at 11/30/18 2129 .  tamsulosin (FLOMAX) capsule 0.4 mg, 0.4 mg, Oral, Daily, Epifanio Lesches, MD, 0.4 mg at 11/30/18 0819 .  traZODone (DESYREL) tablet 50 mg, 50 mg, Oral, QHS PRN, Epifanio Lesches, MD, 50 mg at 11/28/18 2118 .  warfarin (COUMADIN) tablet 1 mg, 1 mg, Oral, ONCE-1800, Hallaji, Sheema M, RPH .  Warfarin - Pharmacist Dosing Inpatient, , Does not apply, q1800, Epifanio Lesches, MD    ALLERGIES   Atorvastatin and Simvastatin    REVIEW OF SYSTEMS   10 system ROS conducted and is negative except as per subjective findings.   PHYSICAL EXAMINATION    Vitals:   12/01/18 0600 12/01/18 0735  BP: 93/66   Pulse: 71   Resp: (!) 25   Temp:    SpO2: 96% 91%    GENERAL: mild distress due to SOB HEAD: Normocephalic, atraumatic.  EYES: Pupils equal, round, reactive to light.  No scleral icterus.  MOUTH: Moist mucosal membrane. NECK: Supple. No thyromegaly. No nodules. No JVD.  PULMONARY: velcro crepitations bilaterally.  CARDIOVASCULAR: S1 and S2. Regular rate and rhythm. No murmurs, rubs, or gallops.  GASTROINTESTINAL: Soft, nontender, non-distended. No masses. Positive bowel sounds. No hepatosplenomegaly.  MUSCULOSKELETAL: No swelling, clubbing, or edema.  NEUROLOGIC: Mild distress due to acute illness SKIN:intact,warm,dry   LABS AND IMAGING     LAB RESULTS: Recent Labs  Lab 11/28/18 0357 11/29/18 1006 11/30/18 0418  NA 135 137 134*  K 4.7 4.1 4.4  CL 99 98 100  CO2 27 26 25   BUN 28* 34* 29*  CREATININE 1.02 1.07 1.05  GLUCOSE 153* 79 135*   Recent Labs  Lab 11/28/18 0357 11/29/18 0357 11/30/18 0418  HGB 11.6* 11.5* 11.6*  HCT 35.9* 35.5* 34.8*  WBC 9.7 11.9* 10.4  PLT 151 150 142*     IMAGING RESULTS: No results found.    ASSESSMENT AND PLAN     Acuteon chronichypoxemic respiratory failure -due toamiodarone induced pulmonary toxicity with severe systolic CHF  -s/p IV solumedrol  - will switch to prednisone 40 mg  - starting Bactrim SS q12 for PCP ppx  - please wean O2 as tolerated  - chest physiotherapy  - IS at bedside- able to inhale to 1250ccsp - home with O2 at least for short term  - pulmonary clinic follow up will set up post d/c -no need for albuterol nebs or DuoNEbs patient goes into Atrial fibrillation  -Discussed warfarin and Bactrim drug/drug interaction with pharmacist, patient has INR monitoring and will adjust warfarin dose as needed     Acute on chronic  decompensated systolic CHF with EF 83%     -Currently on midodrine 10 mg 3 times daily    -Duiresed over 3.5 L thus far    - transient hypotension so Lasix will be DC'd for now - family are okay with IV pressor support if necessary  - DNR/DNI     GI/Nutrition GI PROPHYLAXIS as indicated DIET-->TF's as tolerated Constipation protocol as indicated  ENDO - ICU hypoglycemic\Hyperglycemia protocol -check FSBS per protocol   ELECTROLYTES -follow labs as needed -replace as needed -pharmacy consultation   DVT/GI PRX ordered -SCDs  TRANSFUSIONS AS NEEDED MONITOR FSBS ASSESS the need for LABS as needed   Critical care provider statement:   Critical care time (minutes): 33  Critical care time was exclusive of: Separately billable procedures and treating other patients  Critical care was necessary to treat or prevent imminent or life-threatening deterioration of the following conditions:  Acute on chronic hypoxemic respiratory failure, transient hypotension, acute on chronic systolic CHF exacerbation, atrial fibrillation, multiple comorbid conditions  Critical care was time spent personally by me on the following activities: Development of treatment plan with patient or surrogate, discussions with consultants, evaluation of patient's response to treatment, examination of patient, obtaining history from patient or surrogate, ordering and performing treatments and interventions, ordering and review of laboratory studies and re-evaluation of patient's condition.  I assumed direction of critical care for this patient from another provider in my specialty: no   This document was prepared using Dragon voice recognition software and may include unintentional dictation errors.    Ottie Glazier, M.D.  Division of [place]

## 2018-12-01 NOTE — Progress Notes (Signed)
Pharmacy Electrolyte Monitoring Consult:  Pharmacy consulted to assist in monitoring and replacing electrolytes in this 85 y.o. male admitted on 11/12/2018 with respiratory failure/CHF exacerbation. Patient with past medical history significant for heart failure, atrial fibrillation, hypertension, abdominal aortic aneurysm, hyperlipidemia, and cardiomyopathy.   Labs:  Sodium (mmol/L)  Date Value  11/30/2018 134 (L)  07/05/2018 141  09/04/2011 142   Potassium (mmol/L)  Date Value  11/30/2018 4.4  09/04/2011 4.1   Magnesium (mg/dL)  Date Value  11/28/2018 2.0  09/04/2011 1.8   Phosphorus (mg/dL)  Date Value  12/25/2017 2.5   Calcium (mg/dL)  Date Value  11/30/2018 8.1 (L)   Calcium, Total (mg/dL)  Date Value  09/04/2011 8.8   Albumin (g/dL)  Date Value  11/17/2018 2.9 (L)  12/25/2017 4.3  09/03/2011 3.5    Assessment/Plan: Patient on home dose of digoxin 0.0625 mg PO daily. Last labs ordered on 5/30. Will obtain BMP/Mangesium with am labs on 6/1.   Due to extensive cardiac history, will replace for goal potassium ~ 4 and goal magnesium ~ 2.   Pharmacy will continue to monitor and adjust per consult.    Simpson,Michael L 12/01/2018 10:15 AM

## 2018-12-02 LAB — BASIC METABOLIC PANEL
Anion gap: 9 (ref 5–15)
BUN: 35 mg/dL — ABNORMAL HIGH (ref 8–23)
CO2: 21 mmol/L — ABNORMAL LOW (ref 22–32)
Calcium: 8.2 mg/dL — ABNORMAL LOW (ref 8.9–10.3)
Chloride: 102 mmol/L (ref 98–111)
Creatinine, Ser: 1.18 mg/dL (ref 0.61–1.24)
GFR calc Af Amer: >60 mL/min (ref >60)
GFR calc non Af Amer: 54 mL/min — ABNORMAL LOW (ref >60)
Glucose, Bld: 117 mg/dL — ABNORMAL HIGH (ref 70–99)
Potassium: 5.1 mmol/L (ref 3.5–5.1)
Sodium: 132 mmol/L — ABNORMAL LOW (ref 135–145)

## 2018-12-02 LAB — MAGNESIUM: Magnesium: 2.3 mg/dL (ref 1.7–2.4)

## 2018-12-02 LAB — CBC
HCT: 34.4 % — ABNORMAL LOW (ref 39.0–52.0)
Hemoglobin: 10.8 g/dL — ABNORMAL LOW (ref 13.0–17.0)
MCH: 30 pg (ref 26.0–34.0)
MCHC: 31.4 g/dL (ref 30.0–36.0)
MCV: 95.6 fL (ref 80.0–100.0)
Platelets: UNDETERMINED 10*3/uL (ref 150–400)
RBC: 3.6 MIL/uL — ABNORMAL LOW (ref 4.22–5.81)
RDW: 15.9 % — ABNORMAL HIGH (ref 11.5–15.5)
WBC: 9.5 10*3/uL (ref 4.0–10.5)
nRBC: 0 % (ref 0.0–0.2)

## 2018-12-02 LAB — PROTIME-INR
INR: 2.7 — ABNORMAL HIGH (ref 0.8–1.2)
Prothrombin Time: 28 seconds — ABNORMAL HIGH (ref 11.4–15.2)

## 2018-12-02 MED ORDER — MIDODRINE HCL 5 MG PO TABS
5.0000 mg | ORAL_TABLET | Freq: Three times a day (TID) | ORAL | Status: DC
  Administered 2018-12-02 (×2): 5 mg via ORAL
  Filled 2018-12-02 (×3): qty 1

## 2018-12-02 MED ORDER — SENNA 8.6 MG PO TABS
1.0000 | ORAL_TABLET | Freq: Every day | ORAL | Status: DC
  Administered 2018-12-02 – 2018-12-04 (×3): 8.6 mg via ORAL
  Filled 2018-12-02 (×3): qty 1

## 2018-12-02 MED ORDER — FUROSEMIDE 10 MG/ML IJ SOLN
20.0000 mg | Freq: Once | INTRAMUSCULAR | Status: DC
  Filled 2018-12-02: qty 2

## 2018-12-02 MED ORDER — DIGOXIN 0.25 MG/ML IJ SOLN
0.2500 mg | Freq: Once | INTRAMUSCULAR | Status: AC
  Administered 2018-12-02: 0.25 mg via INTRAVENOUS
  Filled 2018-12-02: qty 2

## 2018-12-02 MED ORDER — WARFARIN 0.5 MG HALF TABLET
0.5000 mg | ORAL_TABLET | Freq: Once | ORAL | Status: DC
  Filled 2018-12-02: qty 1

## 2018-12-02 MED ORDER — DIGOXIN 125 MCG PO TABS
0.1250 mg | ORAL_TABLET | Freq: Every day | ORAL | Status: DC
  Administered 2018-12-03 – 2018-12-04 (×2): 0.125 mg via ORAL
  Filled 2018-12-02 (×3): qty 1

## 2018-12-02 MED ORDER — FUROSEMIDE 10 MG/ML IJ SOLN
40.0000 mg | Freq: Once | INTRAMUSCULAR | Status: AC
  Administered 2018-12-02: 40 mg via INTRAVENOUS
  Filled 2018-12-02: qty 4

## 2018-12-02 MED ORDER — MIDODRINE HCL 5 MG PO TABS
10.0000 mg | ORAL_TABLET | Freq: Three times a day (TID) | ORAL | Status: DC
  Administered 2018-12-02 – 2018-12-04 (×7): 10 mg via ORAL
  Filled 2018-12-02 (×9): qty 2

## 2018-12-02 NOTE — Progress Notes (Signed)
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   -Had multiple family discussions today.  Have spoke to wife is Oletta Lamas and explained that patient may not survive hospitalization would favor consideration home with hospice. -Spoke to daughter Jocelyn Lamer also highlighted overall poor prognosis and suggest comfort care/hospice.   -Palliative care evaluation progress-appreciate collaboration  Patient remains critically ill   PAST MEDICAL HISTORY   Past Medical History:  Diagnosis Date  . Abdominal aortic aneurysm ([place]) 01-2004   4.7 x 4.7 cm.  Marland Kitchen CAD (coronary artery disease) 01/25/2004   a. 01/2004 Ant MI/DES to LAD;  b. 10/2011 Neg MV.  . Chronic systolic heart failure ([place])    a. 08/2016 Echo: EF 35%, nl RV fxn, mod to sev MR, mild to mod TR, mod biatrial enlargement.  . Hyperlipidemia   . Hypertension   . Ischemic cardiomyopathy    a. 08/2016 Echo: EF 35%.  . Moderate to Severe Mitral regurgitation    a. 08/2016 Echo: Mod-sev MR.  Marland Kitchen Permanent atrial fibrillation    a. Chronic coumadin (CHA2DS2VASc = 5).  . Pulmonary nodules    Noted on abdominal CT  . Statin intolerance      SURGICAL HISTORY   Past Surgical History:  Procedure Laterality Date  . ABDOMINAL AORTIC ANEURYSM REPAIR     12/02/2007 [facility]  . ABDOMINAL AORTIC ANEURYSM REPAIR  2006   [facility]   . CARDIAC CATHETERIZATION  2009   [facility]  . CORONARY ANGIOPLASTY WITH STENT PLACEMENT  2005   Cypher stent LAD   . Cypher  stents to LAD     01/25/2004  . SKIN SURGERY  2016   [facility] skin cancer     FAMILY HISTORY   Family History  Problem Relation Age of Onset  . Heart attack Father 69       Died w/ Heart attack  . Diabetes Father   . Heart attack Brother   . Heart disease Brother   . Diabetes Sister   . Heart disease Sister      SOCIAL HISTORY   Social History   Tobacco Use  . Smoking status: Former Smoker    Last attempt to quit: 12/01/1987    Years since quitting: 31.0  . Smokeless tobacco: Never Used  Substance Use Topics  . Alcohol use: No  . Drug use: No     MEDICATIONS   Current Medication:  Current Facility-Administered Medications:  .  bisacodyl (DULCOLAX) EC tablet 5 mg, 5 mg, Oral, Daily PRN, Epifanio Lesches, MD .  digoxin (LANOXIN) tablet 0.0625 mg, 0.0625 mg, Oral, Daily, Epifanio Lesches, MD, 0.0625 mg at 12/01/18 0903 .  docusate sodium (COLACE) capsule 200 mg, 200 mg, Oral, BID, Epifanio Lesches, MD, 200 mg at 12/01/18 2100 .  fluticasone (FLONASE) 50 MCG/ACT nasal spray 1 spray, 1 spray, Each Nare, Daily, Epifanio Lesches, MD, 1 spray at 12/01/18 0904 .  furosemide (LASIX) injection 20 mg, 20 mg, Intravenous, Once, Tukov-Yual, Magdalene S, NP .  guaiFENesin-dextromethorphan (ROBITUSSIN DM) 100-10 MG/5ML syrup 5 mL, 5 mL, Oral, Q4H PRN, Epifanio Lesches, MD, 5 mL at 12/01/18 1157 .  midodrine (PROAMATINE) tablet 5 mg, 5 mg, Oral, TID  WC, Tukov-Yual, Magdalene S, NP .  multivitamin with minerals tablet 1 tablet, 1 tablet, Oral, Daily, Epifanio Lesches, MD, 1 tablet at 12/01/18 0903 .  pantoprazole (PROTONIX) EC tablet 40 mg, 40 mg, Oral, Daily, Epifanio Lesches, MD, 40 mg at 12/01/18 0903 .  predniSONE (DELTASONE) tablet 40 mg, 40 mg, Oral, Q breakfast, Epifanio Lesches, MD, 40 mg at 12/01/18 0856 .  sulfamethoxazole-trimethoprim (BACTRIM) 400-80 MG per tablet 1 tablet, 1 tablet, Oral, Q12H, Epifanio Lesches, MD, 1 tablet at 12/01/18 2100  .  tamsulosin (FLOMAX) capsule 0.4 mg, 0.4 mg, Oral, Daily, Epifanio Lesches, MD, 0.4 mg at 12/01/18 0903 .  traZODone (DESYREL) tablet 50 mg, 50 mg, Oral, QHS PRN, Epifanio Lesches, MD, 50 mg at 12/01/18 2205 .  Warfarin - Pharmacist Dosing Inpatient, , Does not apply, q1800, Epifanio Lesches, MD    ALLERGIES   Atorvastatin and Simvastatin    REVIEW OF SYSTEMS     10 point ROS is negative except for insomnia  PHYSICAL EXAMINATION   Vitals:   12/02/18 0624 12/02/18 0627  BP: (!) 81/59 (!) 81/59  Pulse: 64 64  Resp: 20 (!) 22  Temp:    SpO2: 91% 99%    GENERAL: Distress due to respiratory failure HEAD: Normocephalic, atraumatic.  EYES: Pupils equal, round, reactive to light.  No scleral icterus.  MOUTH: Moist mucosal membrane. NECK: Supple. No thyromegaly. No nodules. No JVD.  PULMONARY: Velcro crepitations bilaterally throughout CARDIOVASCULAR: S1 and S2. Regular rate and rhythm. No murmurs, rubs, or gallops.  GASTROINTESTINAL: Soft, nontender, non-distended. No masses. Positive bowel sounds. No hepatosplenomegaly.  MUSCULOSKELETAL: No swelling, clubbing, or edema.  NEUROLOGIC: Mild distress due to acute illness SKIN:intact,warm,dry   LABS AND IMAGING        LAB RESULTS: Recent Labs  Lab 11/29/18 1006 11/30/18 0418 12/02/18 0518  NA 137 134* 132*  K 4.1 4.4 5.1  CL 98 100 102  CO2 26 25 21*  BUN 34* 29* 35*  CREATININE 1.07 1.05 1.18  GLUCOSE 79 135* 117*   Recent Labs  Lab 11/29/18 0357 11/30/18 0418 12/02/18 0518  HGB 11.5* 11.6* 10.8*  HCT 35.5* 34.8* 34.4*  WBC 11.9* 10.4 9.5  PLT 150 142* PLATELET CLUMPS NOTED ON SMEAR, UNABLE TO ESTIMATE     IMAGING RESULTS: Dg Chest Port 1 View  Result Date: 12/01/2018 CLINICAL DATA:  85 year old male with acute respiratory failure. EXAM: PORTABLE CHEST 1 VIEW COMPARISON:  Chest radiograph dated 11/29/2018, chest CT dated 10/23/2018 and 07/15/2018. FINDINGS: Diffuse interstitial and  airspace densities similar to the prior radiograph. No new consolidative changes. No pneumothorax. Overall no interval change in the appearance of the lungs since the radiograph of 11/29/2018. There is stable cardiomegaly. Atherosclerotic calcification of the aortic arch. Osteopenia with degenerative changes of the spine. No acute osseous pathology. IMPRESSION: No significant interval change in the appearance of the lungs compared to the prior radiograph. Bilateral interstitial and airspace opacities. Electronically Signed   By: Anner Crete M.D.   On: 12/01/2018 23:37      ASSESSMENT AND PLAN    Acuteon chronichypoxemic respiratory failure -due toamiodarone induced pulmonary toxicitywith severe systolic CHF -s/p IV solumedrol  - will switch to prednisone 40 mg  - starting Bactrim SS q12 for PCP ppx  - please wean O2 as tolerated  - chest physiotherapy  - IS at bedside- able to inhale to 750 - home with O2 at least for short term  - pulmonary clinic follow up will set up post d/c -  no need for albuterol nebs or DuoNEbs patient goes into Atrial fibrillation  -Discussed warfarin and Bactrim drug/drug interaction with pharmacist, patient has INR monitoring and will adjust warfarin dose as needed     Acute on chronic decompensated systolic CHF with EF 86% -Currently on midodrine 10 mg 3 times daily -Duiresedover 3.5 L thus far -transient hypotension so Lasix will be DC'd for now - family are okay with IV pressor support if necessary  - DNR/DNI     GI/Nutrition GI PROPHYLAXIS as indicated DIET-->TF's as tolerated Constipation protocol as indicated  ENDO - ICU hypoglycemic\Hyperglycemia protocol -check FSBS per protocol   ELECTROLYTES -follow labs as needed -replace as needed -pharmacy consultation   DVT/GI PRX ordered -SCDs   TRANSFUSIONS AS NEEDED MONITOR FSBS ASSESS the need for LABS as needed   Critical care provider statement:  Critical care time (minutes):34 Critical care time was exclusive of: Separately billable procedures and treating other patients Critical care was necessary to treat or prevent imminent or life-threatening deterioration of the following conditions:Acute on chronic hypoxemic respiratory failure, transient hypotension, acute on chronic systolic CHF exacerbation,atrial fibrillation, multiple comorbid conditions Critical care was time spent personally by me on the following activities: Development of treatment plan with patient or surrogate, discussions with consultants, evaluation of patient's response to treatment, examination of patient, obtaining history from patient or surrogate, ordering and performing treatments and interventions, ordering and review of laboratory studies and re-evaluation of patient's condition. I assumed direction of critical care for this patient from another provider in my specialty: no   This document was prepared using Dragon voice recognition software and may include unintentional dictation errors.   Ottie Glazier, M.D.  Division of [place]

## 2018-12-02 NOTE — Progress Notes (Signed)
[place] [facility] [place] NAME: Joseph Barton    MR#:  [id number]  DATE OF BIRTH:  1926-07-04  SUBJECTIVE:   Chief Complaint  Patient presents with  . Shortness of Breath   Patient continues to require high flow oxygen  REVIEW OF SYSTEMS:  ROS Constitutional: Negative for chills, fever, malaise/fatigue and weight loss.  HENT: Positive for hearing loss. Negative for congestion and sore throat.   Eyes: Negative for blurred vision and double vision.  Respiratory: Positive for shortness of breath and wheezing. Negative for cough, hemoptysis and sputum production.   Cardiovascular: Negative for chest pain, palpitations, orthopnea and leg swelling.       Irregular heart rhythm  Gastrointestinal: Negative for abdominal pain, diarrhea, nausea and vomiting.  Genitourinary: Negative for dysuria and urgency.  Musculoskeletal: Negative for myalgias.  Skin: Negative for rash.  Neurological: Negative for dizziness, sensory change, speech change, focal weakness and headaches.  Psychiatric/Behavioral: Negative for depression.  DRUG ALLERGIES:   Allergies  Allergen Reactions  . Atorvastatin   . Simvastatin    VITALS:  Blood pressure (!) 83/57, pulse (!) 36, temperature 97.6 F (36.4 C), temperature source Oral, resp. rate (!) 33, height 6\' 2"  (1.88 m), weight 79.3 kg, SpO2 99 %. PHYSICAL EXAMINATION:   GENERAL:  85 y.o.-year-old patient lying in the bed with no acute distress.  EYES: Pupils equal, round, reactive to light and accommodation. No scleral icterus. Extraocular muscles intact.  HEENT: Head atraumatic, normocephalic. Oropharynx and nasopharynx clear.  NECK:  Supple, no jugular venous distention. No thyroid enlargement, no tenderness.  LUNGS: Crackles bilaterally  cARDIOVASCULAR: S1, S2 normal. Irregular, no rubs, or gallops.  ABDOMEN: Soft, nontender, nondistended. Bowel sounds present. No organomegaly or mass.  EXTREMITIES: No pedal edema,  cyanosis, or clubbing.  NEUROLOGIC: Cranial nerves II through XII are intact. Muscle strength 5/5 in all extremities. Sensation intact. Gait not checked.  PSYCHIATRIC: The patient is alert and oriented x 3.  SKIN: No obvious rash, lesion, or ulcer.   DATA REVIEWED:  LABORATORY PANEL:  Male CBC Recent Labs  Lab 12/02/18 0518  WBC 9.5  HGB 10.8*  HCT 34.4*  PLT PLATELET CLUMPS NOTED ON SMEAR, UNABLE TO ESTIMATE   ------------------------------------------------------------------------------------------------------------------ Chemistries  Recent Labs  Lab 11/30/2018 0603  12/02/18 0518  NA 137   < > 132*  K 4.4   < > 5.1  CL 99   < > 102  CO2 26   < > 21*  GLUCOSE 106*   < > 117*  BUN 25*   < > 35*  CREATININE 1.35*   < > 1.18  CALCIUM 8.5*   < > 8.2*  MG 1.8   < > 2.3  AST 23  --   --   ALT 23  --   --   ALKPHOS 66  --   --   BILITOT 0.7  --   --    < > = values in this interval not displayed.   RADIOLOGY:  Dg Chest Port 1 View  Result Date: 12/01/2018 CLINICAL DATA:  85 year old male with acute respiratory failure. EXAM: PORTABLE CHEST 1 VIEW COMPARISON:  Chest radiograph dated 11/29/2018, chest CT dated 10/23/2018 and 07/15/2018. FINDINGS: Diffuse interstitial and airspace densities similar to the prior radiograph. No new consolidative changes. No pneumothorax. Overall no interval change in the appearance of the lungs since the radiograph of 11/29/2018. There is stable cardiomegaly. Atherosclerotic calcification of the aortic arch. Osteopenia with  degenerative changes of the spine. No acute osseous pathology. IMPRESSION: No significant interval change in the appearance of the lungs compared to the prior radiograph. Bilateral interstitial and airspace opacities. Electronically Signed   By: Anner Crete M.D.   On: 12/01/2018 23:37   ASSESSMENT AND PLAN:   85 y.o. male with a known history of ischemic cardiomyopathy, atrial fibrillation on Coumadin, hyperlipidemia,  chronic systolic heart failure, MI, CAD status post percutaneous coronary angioplasty, AAA, amiodarone-induced lung injury, and hypertension presenting with worsening shortness of breath.  1. Acute on chronic hypoxic respiratory failure - felt to be due to amiodarone induced lung injury also with CHF on chronic home oxygen at 2 L.  Try to wean down oxygen  2. Acute on chronic diastolic Congestive Heart Failure-BNP remains elevated  Last Echo 10/23/18,  EF 25 to 30% -Lasix on hold due to low blood pressure Continue digoxin  3. Elevated troponin -Likely demand ischemia - Improved - EKG reviewed as above  4. Chronic atrial fibrillation - noted with episodes of RVR -Hold antihypertension - Continue Coumadin  - Coumadin management per pharmacy  5. Hypertension -blood pressure on hold  6. DVT prophylaxis -on Coumadin    CODE STATUS: DNR prognosis very poor  TOTAL TIME TAKING CARE OF THIS PATIENT: 66min.      on 12/02/2018 at 2:22 PM    Between 7am to 6pm - Pager - [phone]  After 6pm go to [email or web address] - password [email or web address] [facility]  [employer]  Hospitalists  Office  [phone]  CC: Primary care physician; Jerrol Banana., MD  Note: This dictation was prepared with Dragon dictation along with smaller phrase technology. Any transcriptional errors that result from this process are unintentional.

## 2018-12-02 NOTE — Progress Notes (Signed)
Midodrine not given due to dose was not verified by pharmacy. Lasix not given due to BP 92/60 HR 65.  Patient is on HFNC at 55L and 70%% FIO2. Patient denies shortness of breath currently.

## 2018-12-02 NOTE — Progress Notes (Signed)
Daily Progress Note   Patient Name: Joseph Barton       Date: 12/02/2018 DOB: Nov 20, 1926  Age: 85 y.o. MRN#: [id number] Attending Physician: Dustin Flock, MD Primary Care Physician: Jerrol Banana., MD Admit Date: 11/05/2018  Reason for Consultation/Follow-up: Establishing goals of care  Subjective: Patient is sitting up in bed with high flow R[place] in place. He states he had a bad night, but states he feels better this morning. He is requesting prune juice and apple sauce. Apple sauce provided to bedside, prune juice to be ordered from nutrition. He states he is eating well.   He states he is hopeful to come off the high flow O2 and go home. He states he does not want to return to SNF as he did not like it. We discussed different scenerios. He states if he does not improve, he is ready to go to heaven. He states "I'm 85 years old, and I have nothing to build on now." QOL is very important to him. He states prior to his previous admission, he was walking with his cane and mowing the yard. He states if he improves enough to go home, he most likely will not want to return to the hospital again if he declines, and would favor hospice at home.    Length of Stay: 5  Current Medications: Scheduled Meds:  . digoxin  0.0625 mg Oral Daily  . docusate sodium  200 mg Oral BID  . fluticasone  1 spray Each Nare Daily  . furosemide  20 mg Intravenous Once  . midodrine  5 mg Oral TID WC  . multivitamin with minerals  1 tablet Oral Daily  . pantoprazole  40 mg Oral Daily  . predniSONE  40 mg Oral Q breakfast  . sulfamethoxazole-trimethoprim  1 tablet Oral Q12H  . tamsulosin  0.4 mg Oral Daily  . warfarin  0.5 mg Oral ONCE-1800  . Warfarin - Pharmacist Dosing Inpatient   Does not apply q1800     Continuous Infusions:   PRN Meds: bisacodyl, guaiFENesin-dextromethorphan, traZODone  Physical Exam Pulmonary:     Comments: On high flow cannula. Skin:    General: Skin is warm and dry.  Neurological:     Mental Status: He is alert.  Vital Signs: BP (!) 81/59   Pulse 64   Temp 97.6 F (36.4 C) (Axillary)   Resp (!) 22   Ht 6\' 2"  (1.88 m)   Wt 79.3 kg   SpO2 96%   BMI 22.45 kg/m  SpO2: SpO2: 96 % O2 Device: O2 Device: High Flow Nasal Cannula O2 Flow Rate: O2 Flow Rate (L/min): 55 L/min  Intake/output summary:   Intake/Output Summary (Last 24 hours) at 12/02/2018 1052 Last data filed at 12/02/2018 0600 Gross per 24 hour  Intake 240 ml  Output 750 ml  Net -510 ml   LBM: Last BM Date: 11/28/18 Baseline Weight: Weight: 81.6 kg Most recent weight: Weight: 79.3 kg       Palliative Assessment/Data:    Flowsheet Rows     Most Recent Value  Intake Tab  Referral Department  Hospitalist  Unit at Time of Referral  Cardiac/Telemetry Unit  Palliative Care Primary Diagnosis  Pulmonary  Date Notified  11/29/18  Palliative Care Type  New Palliative care  Reason for referral  Clarify Goals of Care  Date first seen by Palliative Care  11/29/18  # of days Palliative referral response time  0 Day(s)  Clinical Assessment  Palliative Performance Scale Score  50%  Psychosocial & Spiritual Assessment  Palliative Care Outcomes  Patient/Family meeting held?  Yes  Who was at the meeting?  patient and wife  Palliative Care Outcomes  Clarified goals of care, Provided end of life care assistance, Provided psychosocial or spiritual support, ACP counseling assistance      Patient Active Problem List   Diagnosis Date Noted  . Palliative care by specialist   . Goals of care, counseling/discussion   . Amiodarone pulmonary toxicity   . Acute on chronic respiratory failure with hypoxia ([place])   . Acute on chronic systolic heart failure ([place])   . HCAP  (healthcare-associated pneumonia) 10/20/2018  . Malnutrition of moderate degree 07/18/2018  . Pneumonia 07/15/2018  . Hypotension 12/12/2017  . CHF (congestive heart failure) ([place]) 01/16/202019  . Subclinical hypothyroidism 10/06/2015  . Allergic rhinitis 09/10/2015  . Bradycardia 09/10/2015  . Failure of erection 09/10/2015  . Blood glucose elevated 09/10/2015  . BP (high blood pressure) 09/10/2015  . Neuropathy 09/10/2015  . Apnea, sleep 09/10/2015  . Cancer of skin, squamous cell 09/10/2015  . Acid reflux 04/28/2015  . Arthritis, degenerative 12/25/2014  . Cardiomyopathy, ischemic 09/27/2013  . HLD (hyperlipidemia) 09/27/2013  . Lung nodule, multiple 09/27/2013  . Drug intolerance 09/27/2013  . Ischemic cardiomyopathy 12/01/2011  . Atrial fibrillation ([place]) 12/01/2011  . Chronic systolic heart failure ([place]) 12/01/2011  . IVCD (intraventricular conduction defect) 12/01/2011  . Intraventricular block 12/01/2011  . 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 CAD S/P percutaneous coronary angioplasty 01/25/2004  . Myocardial infarction ([place]) 01/25/2004  . AAA (abdominal aortic aneurysm) ([place]) 01/01/2004    Palliative Care Assessment & Plan    Recommendations/Plan:  Will continue to follow.   Confirms DNR/DNI.    Code Status:    Code Status Orders  (From admission, onward)         Start     Ordered   11/01/2018 0922  Do not attempt resuscitation (DNR)  Continuous    Question Answer Comment  In the event of cardiac or respiratory ARREST Do not call a "code blue"   In the event of cardiac or respiratory ARREST Do not perform Intubation, CPR, defibrillation or ACLS   In the event of cardiac or respiratory  ARREST Use medication by any route, position, wound care, and other measures to relive pain and suffering. May use oxygen, suction and manual treatment of airway obstruction as needed for comfort.   Comments Discussed with patient directly with multiple  ED RN witnesses. Patient does not want intubation nor chest compressions.      11/03/2018 0923        Code Status History    Date Active Date Inactive Code Status Order ID Comments User Context   11/08/2018 0631 11/13/2018 0923 DNR [id number]  Hinda Kehr, MD ED   10/20/2018 2118 11/01/2018 2157 DNR [id number]  Saundra Shelling, MD Inpatient   07/16/2018 1139 07/20/2018 1713 DNR [id number]  Saundra Shelling, MD ED   07/16/2018 0451 07/16/2018 1139 Full Code [id number]  Gladstone Lighter, MD ED   12/04/2017 1517 12/06/2017 1916 Partial Code [id number]  Salary, Avel Peace, MD Inpatient    Advance Directive Documentation     Most Recent Value  Type of Advance Directive  Healthcare Power of Attorney, Living will  Pre-existing out of facility DNR order (yellow form or pink MOST form)  -  "MOST" Form in Place?  -       Prognosis:   Unable to determine  Discharge Planning:  To Be Determined  Care plan was discussed with RN  Thank you for allowing the Palliative Medicine Team to assist in the care of this patient.   Total Time 35 min Prolonged Time Billed No      Greater than 50%  of this time was spent counseling and coordinating care related to the above assessment and plan.  Asencion Gowda, NP  Please contact Palliative Medicine Team phone at [phone] for questions and concerns.

## 2018-12-02 NOTE — Progress Notes (Signed)
Consulted with Shanon Brow in pharmacy concerning new orders entered. Hydralazine was given at 2201, he suggest to hold off on giving midodrine to allow the hydralazine to clear the system and if BP improves, ok to give Lasix at that time.

## 2018-12-02 NOTE — Consult Note (Signed)
Joseph Barton for Warfarin Indication: atrial fibrillation  Patient Measurements: Height: 6\' 2"  (188 cm) Weight: 174 lb 13.2 oz (79.3 kg) IBW/kg (Calculated) : 82.2  Vital Signs: Temp: 97.6 F (36.4 C) (06/01 0300) Temp Source: Axillary (06/01 0300) BP: 81/59 (06/01 0627) Pulse Rate: 64 (06/01 0627)  Labs: Recent Labs    11/29/18 1006 11/30/18 0418 12/01/18 0529 12/02/18 0518  HGB  --  11.6*  --  10.8*  HCT  --  34.8*  --  34.4*  PLT  --  142*  --  PLATELET CLUMPS NOTED ON SMEAR, UNABLE TO ESTIMATE  LABPROT  --  23.0* 25.7* 28.0*  INR  --  2.1* 2.4* 2.7*  CREATININE 1.07 1.05  --  1.18    Estimated Creatinine Clearance: 46.7 mL/min (by C-G formula based on SCr of 1.18 mg/dL).   Medical History: Past Medical History:  Diagnosis Date  . Abdominal aortic aneurysm ([place]) 01-2004   4.7 x 4.7 cm.  Marland Kitchen CAD (coronary artery disease) 01/25/2004   a. 01/2004 Ant MI/DES to LAD;  b. 10/2011 Neg MV.  . Chronic systolic heart failure ([place])    a. 08/2016 Echo: EF 35%, nl RV fxn, mod to sev MR, mild to mod TR, mod biatrial enlargement.  . Hyperlipidemia   . Hypertension   . Ischemic cardiomyopathy    a. 08/2016 Echo: EF 35%.  . Moderate to Severe Mitral regurgitation    a. 08/2016 Echo: Mod-sev MR.  Marland Kitchen Permanent atrial fibrillation    a. Chronic coumadin (CHA2DS2VASc = 5).  . Pulmonary nodules    Noted on abdominal CT  . Statin intolerance     Medications:  PTA dose 1mg  Warfarin qd (5/26 [facility] from [facility] - primary care provider to increase dose to 2mg  qd)  Assessment: Permanent A Fib with CHA2DS2VASc score of 5.  H&H, PLT trending down slightly, will continue to monitor  Progressing cautiously because of drug interaction with Bactrim  DATE INR DOSE 5/27 1.4 2mg   + Bactrim 5/28 1.3 2mg  5/29 1.8 2mg  5/30 2.1 1.5mg  5/31 2.4 1 mg 6/01  2.7 0.5 mg  Goal of Therapy:  INR 2-3 Monitor platelets by anticoagulation protocol: Yes    Plan:  INR remains therapeutic but continues to increase. Will order warfarin 0.5 mg x 1 dose tonight due to increase of 0.3 x 3 days  and Drug-drug interaction with Bactrim.    Will check INR daily while on antibiotics and CBC a minimum of every 3 days while on warfarin.  Dallie Piles, PharmD Clinical Pharmacist 12/02/2018 9:22 AM

## 2018-12-02 DEATH — deceased

## 2018-12-03 ENCOUNTER — Inpatient Hospital Stay: Payer: Medicare Other

## 2018-12-03 LAB — CBC WITH DIFFERENTIAL/PLATELET
Abs Immature Granulocytes: 0.07 10*3/uL (ref 0.00–0.07)
Basophils Absolute: 0 10*3/uL (ref 0.0–0.1)
Basophils Relative: 0 %
Eosinophils Absolute: 0 10*3/uL (ref 0.0–0.5)
Eosinophils Relative: 0 %
HCT: 31.3 % — ABNORMAL LOW (ref 39.0–52.0)
Hemoglobin: 10.4 g/dL — ABNORMAL LOW (ref 13.0–17.0)
Immature Granulocytes: 1 %
Lymphocytes Relative: 8 %
Lymphs Abs: 0.9 10*3/uL (ref 0.7–4.0)
MCH: 30 pg (ref 26.0–34.0)
MCHC: 33.2 g/dL (ref 30.0–36.0)
MCV: 90.2 fL (ref 80.0–100.0)
Monocytes Absolute: 0.3 10*3/uL (ref 0.1–1.0)
Monocytes Relative: 3 %
Neutro Abs: 9.4 10*3/uL — ABNORMAL HIGH (ref 1.7–7.7)
Neutrophils Relative %: 88 %
Platelets: 132 10*3/uL — ABNORMAL LOW (ref 150–400)
RBC: 3.47 MIL/uL — ABNORMAL LOW (ref 4.22–5.81)
RDW: 15.8 % — ABNORMAL HIGH (ref 11.5–15.5)
WBC: 10.6 10*3/uL — ABNORMAL HIGH (ref 4.0–10.5)
nRBC: 0 % (ref 0.0–0.2)

## 2018-12-03 LAB — PROTIME-INR
INR: 3.1 — ABNORMAL HIGH (ref 0.8–1.2)
Prothrombin Time: 31.1 seconds — ABNORMAL HIGH (ref 11.4–15.2)

## 2018-12-03 LAB — BASIC METABOLIC PANEL
Anion gap: 10 (ref 5–15)
BUN: 37 mg/dL — ABNORMAL HIGH (ref 8–23)
CO2: 24 mmol/L (ref 22–32)
Calcium: 7.8 mg/dL — ABNORMAL LOW (ref 8.9–10.3)
Chloride: 97 mmol/L — ABNORMAL LOW (ref 98–111)
Creatinine, Ser: 1.08 mg/dL (ref 0.61–1.24)
GFR calc Af Amer: >60 mL/min (ref >60)
GFR calc non Af Amer: >60 mL/min (ref >60)
Glucose, Bld: 98 mg/dL (ref 70–99)
Potassium: 4.2 mmol/L (ref 3.5–5.1)
Sodium: 131 mmol/L — ABNORMAL LOW (ref 135–145)

## 2018-12-03 MED ORDER — POTASSIUM CHLORIDE 20 MEQ PO PACK
20.0000 meq | PACK | Freq: Once | ORAL | Status: AC
  Administered 2018-12-03: 20 meq via ORAL
  Filled 2018-12-03: qty 1

## 2018-12-03 MED ORDER — FUROSEMIDE 10 MG/ML IJ SOLN
20.0000 mg | Freq: Once | INTRAMUSCULAR | Status: AC
  Administered 2018-12-03: 20 mg via INTRAVENOUS
  Filled 2018-12-03: qty 2

## 2018-12-03 MED ORDER — MORPHINE SULFATE (PF) 2 MG/ML IV SOLN
1.0000 mg | INTRAVENOUS | Status: DC | PRN
  Filled 2018-12-03: qty 1

## 2018-12-03 MED ORDER — METHYLPREDNISOLONE SODIUM SUCC 125 MG IJ SOLR
80.0000 mg | Freq: Once | INTRAMUSCULAR | Status: AC
  Administered 2018-12-03: 80 mg via INTRAVENOUS
  Filled 2018-12-03: qty 2

## 2018-12-03 NOTE — Progress Notes (Addendum)
Daily Progress Note   Patient Name: Joseph Barton       Date: 12/03/2018 DOB: 13-Jan-1927  Age: 85 y.o. MRN#: [id number] Attending Physician: Dustin Flock, MD Primary Care Physician: Jerrol Banana., MD Admit Date: 11/26/2018  Reason for Consultation/Follow-up: Establishing goals of care  Subjective: Patient is sitting up in bed with [place] in place on 15 lpm. He appears dyspneic. He states he spoke with the pulmonary doctor this morning and discussed that he would give it a few more days to see if he could be helped, and if not, he would want to go home. We discussed hospice care. He states he wants all care up to his DNR/DNI at this time. He began to eat his lunch, and O2 sat began to decrease to low 90's.   Spoke to his wife. She states she understands the situation and will honor whatever he wants. She states she is hopeful he will improve, and will support him if he does not.   We discussed his diagnosis, prognosis, GOC, EOL wishes disposition and options. Discussed limitations of medical interventions to prolong quality of life in some situations and discussed the concept of human mortality.   Length of Stay: 6  Current Medications: Scheduled Meds:  . digoxin  0.125 mg Oral Daily  . docusate sodium  200 mg Oral BID  . fluticasone  1 spray Each Nare Daily  . furosemide  20 mg Intravenous Once  . midodrine  10 mg Oral TID WC  . multivitamin with minerals  1 tablet Oral Daily  . pantoprazole  40 mg Oral Daily  . potassium chloride  20 mEq Oral Once  . predniSONE  40 mg Oral Q breakfast  . senna  1 tablet Oral Daily  . sulfamethoxazole-trimethoprim  1 tablet Oral Q12H  . tamsulosin  0.4 mg Oral Daily  . Warfarin - Pharmacist Dosing Inpatient   Does not apply q1800    Continuous  Infusions:   PRN Meds: bisacodyl, guaiFENesin-dextromethorphan  Physical Exam Pulmonary:     Comments: On [place] Skin:    General: Skin is warm and dry.  Neurological:     Mental Status: He is alert.             Vital Signs: BP (!) 95/57   Pulse (!) 56  Temp 98.5 F (36.9 C) (Oral)   Resp 20   Ht 6\' 2"  (1.88 m)   Wt 79.3 kg   SpO2 100%   BMI 22.45 kg/m  SpO2: SpO2: 100 % O2 Device: O2 Device: High Flow Nasal Cannula O2 Flow Rate: O2 Flow Rate (L/min): 15 L/min  Intake/output summary:   Intake/Output Summary (Last 24 hours) at 12/03/2018 1221 Last data filed at 12/03/2018 0600 Gross per 24 hour  Intake 480 ml  Output 1900 ml  Net -1420 ml   LBM: Last BM Date: 11/28/18 Baseline Weight: Weight: 81.6 kg Most recent weight: Weight: 79.3 kg       Palliative Assessment/Data:    Flowsheet Rows     Most Recent Value  Intake Tab  Referral Department  Hospitalist  Unit at Time of Referral  Cardiac/Telemetry Unit  Palliative Care Primary Diagnosis  Pulmonary  Date Notified  11/29/18  Palliative Care Type  New Palliative care  Reason for referral  Clarify Goals of Care  Date of Admission  11/09/2018  Date first seen by Palliative Care  11/29/18  # of days Palliative referral response time  0 Day(s)  # of days IP prior to Palliative referral  2  Clinical Assessment  Palliative Performance Scale Score  50%  Psychosocial & Spiritual Assessment  Palliative Care Outcomes  Patient/Family meeting held?  Yes  Who was at the meeting?  patient and wife  Palliative Care Outcomes  Clarified goals of care, Provided end of life care assistance, Provided psychosocial or spiritual support, ACP counseling assistance      Patient Active Problem List   Diagnosis Date Noted  . Palliative care by specialist   . Goals of care, counseling/discussion   . Amiodarone pulmonary toxicity   . Acute on chronic respiratory failure with hypoxia ([place])   . Acute on chronic systolic heart failure  ([place])   . HCAP (healthcare-associated pneumonia) 10/20/2018  . Malnutrition of moderate degree 07/18/2018  . Pneumonia 07/15/2018  . Hypotension 12/12/2017  . CHF (congestive heart failure) ([place]) 28-Apr-202019  . Subclinical hypothyroidism 10/06/2015  . Allergic rhinitis 09/10/2015  . Bradycardia 09/10/2015  . Failure of erection 09/10/2015  . Blood glucose elevated 09/10/2015  . BP (high blood pressure) 09/10/2015  . Neuropathy 09/10/2015  . Apnea, sleep 09/10/2015  . Cancer of skin, squamous cell 09/10/2015  . Acid reflux 04/28/2015  . Arthritis, degenerative 12/25/2014  . Cardiomyopathy, ischemic 09/27/2013  . HLD (hyperlipidemia) 09/27/2013  . Lung nodule, multiple 09/27/2013  . Drug intolerance 09/27/2013  . Ischemic cardiomyopathy 12/01/2011  . Atrial fibrillation ([place]) 12/01/2011  . Chronic systolic heart failure ([place]) 12/01/2011  . IVCD (intraventricular conduction defect) 12/01/2011  . Intraventricular block 12/01/2011  . 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 CAD S/P percutaneous coronary angioplasty 01/25/2004  . Myocardial infarction ([place]) 01/25/2004  . AAA (abdominal aortic aneurysm) ([place]) 01/01/2004    Palliative Care Assessment & Plan    Recommendations/Plan:  Will continue to follow.   Confirms DNR/DNI.    Code Status:    Code Status Orders  (From admission, onward)         Start     Ordered   11/02/2018 0922  Do not attempt resuscitation (DNR)  Continuous    Question Answer Comment  In the event of cardiac or respiratory ARREST Do not call a "code blue"   In the event of cardiac or respiratory ARREST Do not perform Intubation, CPR, defibrillation or ACLS   In the  event of cardiac or respiratory ARREST Use medication by any route, position, wound care, and other measures to relive pain and suffering. May use oxygen, suction and manual treatment of airway obstruction as needed for comfort.   Comments Discussed with patient  directly with multiple ED RN witnesses. Patient does not want intubation nor chest compressions.      11/04/2018 0923        Code Status History    Date Active Date Inactive Code Status Order ID Comments User Context   11/03/2018 0631 11/30/2018 0923 DNR [id number]  Hinda Kehr, MD ED   10/20/2018 2118 11/01/2018 2157 DNR [id number]  Saundra Shelling, MD Inpatient   07/16/2018 1139 07/20/2018 1713 DNR [id number]  Saundra Shelling, MD ED   07/16/2018 0451 07/16/2018 1139 Full Code [id number]  Gladstone Lighter, MD ED   12/04/2017 1517 12/06/2017 1916 Partial Code [id number]  Salary, Avel Peace, MD Inpatient    Advance Directive Documentation     Most Recent Value  Type of Advance Directive  Healthcare Power of Attorney, Living will  Pre-existing out of facility DNR order (yellow form or pink MOST form)  -  "MOST" Form in Place?  -       Prognosis:   Unable to determine  Discharge Planning:  To Be Determined  Care plan was discussed with RN, CM  Thank you for allowing the Palliative Medicine Team to assist in the care of this patient.   Total Time 35 min Prolonged Time Billed No      Greater than 50%  of this time was spent counseling and coordinating care related to the above assessment and plan.  Asencion Gowda, NP  Please contact Palliative Medicine Team phone at [phone] for questions and concerns.

## 2018-12-03 NOTE — Progress Notes (Signed)
Physical Therapy Treatment Patient Details Name: Joseph Barton MRN: [id number] DOB: Apr 02, 1927 Today's Date: 12/03/2018    History of Present Illness 85 y.o. male  with past medical history of chronic systolic heart failure, ischemic cardiomyopathy with EF 25-30%, CAD, amiodarone-induced lung injury, HTN, HLD, atrial fibrillation on coumadin, pulmonary nodules, moderate to severe mitral regurgitation, statin intolerance, AAA s/p repair admitted on 11/14/2018 with shortness of breath. Hospital admission for acute on chronic hypoxic respiratory failure secondary to amiodarone induced lung injury with underlying chronic CHF.     PT Comments    Pt is making gradual progress towards goals with ability to perform there-ex while sitting in bed. Pt still remains on increased levels of HFNC and gets SOB symptoms quickly with any exertion, needing multiple rest breaks. Pt wants to participate in therapy, however doesn't want to return to SNF. He told therapist he hopes he can go home and die peacefully. Will continue to progress as able.   Follow Up Recommendations  (home with hospice)     Equipment Recommendations  None recommended by PT    Recommendations for Other Services       Precautions / Restrictions Precautions Precautions: Fall Precaution Comments: O2, BP Restrictions Weight Bearing Restrictions: No    Mobility  Bed Mobility Overal bed mobility: Needs Assistance             General bed mobility comments: preferred not to perform bed mobility this date due to SOB symptoms. Needs + 2 assist for scooting up towards [place].  Transfers                 General transfer comment: not performed  Ambulation/Gait                 Stairs             Wheelchair Mobility    Modified Rankin (Stroke Patients Only)       Balance                                            Cognition Arousal/Alertness: Awake/alert Behavior During Therapy: WFL  for tasks assessed/performed Overall Cognitive Status: Within Functional Limits for tasks assessed                                        Exercises Other Exercises Other Exercises: supine ther-ex performed including B LE ankle pumps, hip abd/add, and SLRs. Pt fatigues quickly and needs multiple prolonged rest breaks. O2 sats 93% at rest and 95% with exertion on HFNC.    General Comments        Pertinent Vitals/Pain Pain Assessment: No/denies pain    Home Living                      Prior Function            PT Goals (current goals can now be found in the care plan section) Acute Rehab PT Goals Patient Stated Goal: to go home PT Goal Formulation: With patient Time For Goal Achievement: 12/15/18 Potential to Achieve Goals: Good Progress towards PT goals: Progressing toward goals    Frequency    Min 2X/week      PT Plan Discharge plan needs to be updated    Co-evaluation  AM-PAC PT "6 Clicks" Mobility   Outcome Measure  Help needed turning from your back to your side while in a flat bed without using bedrails?: A Little Help needed moving from lying on your back to sitting on the side of a flat bed without using bedrails?: A Little Help needed moving to and from a bed to a chair (including a wheelchair)?: A Little Help needed standing up from a chair using your arms (e.g., wheelchair or bedside chair)?: A Little Help needed to walk in hospital room?: A Lot Help needed climbing 3-5 steps with a railing? : Total 6 Click Score: 15    End of Session Equipment Utilized During Treatment: Oxygen Activity Tolerance: Patient limited by fatigue Patient left: in bed;with call bell/phone within reach Nurse Communication: Mobility status PT Visit Diagnosis: Other abnormalities of gait and mobility (R26.89);Muscle weakness (generalized) (M62.81)     Time: 1031-2811 PT Time Calculation (min) (ACUTE ONLY): 23 min  Charges:   $Therapeutic Exercise: 23-37 mins                     Greggory Stallion, PT, DPT [phone]    Joseph Barton 12/03/2018, 11:05 AM

## 2018-12-03 NOTE — Progress Notes (Signed)
CRITICAL CARE NOTE  CC  follow up respiratory failure  SUBJECTIVE Patient remains critically ill Prognosis is guarded Severe hypoxia Increased WOB Alert and awake DNR/DNI   BP (!) 90/59   Pulse 62   Temp 97.8 F (36.6 C) (Oral)   Resp (!) 28   Ht 6\' 2"  (1.88 m)   Wt 79.3 kg   SpO2 100%   BMI 22.45 kg/m    I/O last 3 completed shifts: In: 720 [P.O.:720] Out: 2450 [Urine:2450] Total I/O In: 520 [P.O.:520] Out: -   SpO2: 100 % O2 Flow Rate (L/min): 15 L/min FiO2 (%): 65 %     REVIEW OF SYSTEMS  PATIENT IS UNABLE TO PROVIDE COMPLETE REVIEW OF SYSTEMS DUE TO SEVERE CRITICAL ILLNESS   PHYSICAL EXAMINATION:  GENERAL:critically ill appearing, +resp distress HEAD: Normocephalic, atraumatic.  EYES: Pupils equal, round, reactive to light.  No scleral icterus.  MOUTH: Moist mucosal membrane. NECK: Supple. No thyromegaly. No nodules. No JVD.  PULMONARY: +rhonchi,  CARDIOVASCULAR: S1 and S2. Regular rate and rhythm. No murmurs, rubs, or gallops.  GASTROINTESTINAL: Soft, nontender, -distended. No masses. Positive bowel sounds. No hepatosplenomegaly.  MUSCULOSKELETAL: No swelling, clubbing, or edema.  NEUROLOGIC: alert and awake SKIN:intact,warm,dry  MEDICATIONS: I have reviewed all medications and confirmed regimen as documented   CULTURE RESULTS   Recent Results (from the past 240 hour(s))  SARS Coronavirus 2 (CEPHEID- Performed in [facility] hospital lab), Hosp Order     Status: None   Collection Time: 11/04/2018  6:03 AM  Result Value Ref Range Status   SARS Coronavirus 2 NEGATIVE NEGATIVE Final    Comment: (NOTE) If result is NEGATIVE SARS-CoV-2 target nucleic acids are NOT DETECTED. The SARS-CoV-2 RNA is generally detectable in upper and lower  respiratory specimens during the acute phase of infection. The lowest  concentration of SARS-CoV-2 viral copies this assay can detect is 250  copies / mL. A negative result does not preclude SARS-CoV-2 infection   and should not be used as the sole basis for treatment or other  patient management decisions.  A negative result may occur with  improper specimen collection / handling, submission of specimen other  than nasopharyngeal swab, presence of viral mutation(s) within the  areas targeted by this assay, and inadequate number of viral copies  (<250 copies / mL). A negative result must be combined with clinical  observations, patient history, and epidemiological information. If result is POSITIVE SARS-CoV-2 target nucleic acids are DETECTED. The SARS-CoV-2 RNA is generally detectable in upper and lower  respiratory specimens dur ing the acute phase of infection.  Positive  results are indicative of active infection with SARS-CoV-2.  Clinical  correlation with patient history and other diagnostic information is  necessary to determine patient infection status.  Positive results do  not rule out bacterial infection or co-infection with other viruses. If result is PRESUMPTIVE POSTIVE SARS-CoV-2 nucleic acids MAY BE PRESENT.   A presumptive positive result was obtained on the submitted specimen  and confirmed on repeat testing.  While 2019 novel coronavirus  (SARS-CoV-2) nucleic acids may be present in the submitted sample  additional confirmatory testing may be necessary for epidemiological  and / or clinical management purposes  to differentiate between  SARS-CoV-2 and other Sarbecovirus currently known to infect humans.  If clinically indicated additional testing with an alternate test  methodology [phone]) is advised. The SARS-CoV-2 RNA is generally  detectable in upper and lower respiratory sp ecimens during the acute  phase of infection. The  expected result is Negative. Fact Sheet for Patients:  StrictlyIdeas.no Fact Sheet for Healthcare Providers: BankingDealers.co.za This test is not yet approved or cleared by the [place] FDA  and has been authorized for detection and/or diagnosis of SARS-CoV-2 by FDA under an Emergency Use Authorization (EUA).  This EUA will remain in effect (meaning this test can be used) for the duration of the COVID-19 declaration under Section 564(b)(1) of the Act, 21 U.S.C. section 360bbb-3(b)(1), unless the authorization is terminated or revoked sooner. Performed at [facility], [address], [place], [place] [address]   Urine culture     Status: None   Collection Time: 11/25/2018  6:03 AM  Result Value Ref Range Status   Specimen Description   Final    URINE, RANDOM Performed at [facility], [address] [place], [place] [address]    Special Requests   Final    NONE Performed at [facility], [address], [place], [place] [address]    Culture   Final    NO GROWTH Performed at [facility], [place] [address] [place], [place] [address]    Report Status 11/28/2018 FINAL  Final          CBC    Component Value Date/Time   WBC 10.6 (H) 12/03/2018 0243   RBC 3.47 (L) 12/03/2018 0243   HGB 10.4 (L) 12/03/2018 0243   HGB 14.8 07/05/2018 1025   HCT 31.3 (L) 12/03/2018 0243   HCT 44.0 07/05/2018 1025   PLT 132 (L) 12/03/2018 0243   PLT 171 07/05/2018 1025   MCV 90.2 12/03/2018 0243   MCV 92 07/05/2018 1025   MCV 95 09/04/2011 0503   MCH 30.0 12/03/2018 0243   MCHC 33.2 12/03/2018 0243   RDW 15.8 (H) 12/03/2018 0243   RDW 13.0 07/05/2018 1025   RDW 15.6 (H) 09/04/2011 0503   LYMPHSABS 0.9 12/03/2018 0243   LYMPHSABS 1.3 07/05/2018 1025   LYMPHSABS 1.4 09/04/2011 0503   MONOABS 0.3 12/03/2018 0243   MONOABS 0.4 09/04/2011 0503   EOSABS 0.0 12/03/2018 0243   EOSABS 0.1 07/05/2018 1025   EOSABS 0.1 09/04/2011 0503   BASOSABS 0.0 12/03/2018 0243   BASOSABS 0.0 07/05/2018 1025   BASOSABS 0.0 09/04/2011 0503   BMP Latest Ref Rng & Units 12/03/2018 12/02/2018 11/30/2018  Glucose 70 - 99 mg/dL 98 117(H) 135(H)  BUN 8 - 23 mg/dL 37(H)  35(H) 29(H)  Creatinine 0.61 - 1.24 mg/dL 1.08 1.18 1.05  BUN/Creat Ratio 10 - 24 - - -  Sodium 135 - 145 mmol/L 131(L) 132(L) 134(L)  Potassium 3.5 - 5.1 mmol/L 4.2 5.1 4.4  Chloride 98 - 111 mmol/L 97(L) 102 100  CO2 22 - 32 mmol/L 24 21(L) 25  Calcium 8.9 - 10.3 mg/dL 7.8(L) 8.2(L) 8.1(L)    Anti-infectives (From admission, onward)   Start     Dose/Rate Route Frequency Ordered Stop   11/13/2018 1930  sulfamethoxazole-trimethoprim (BACTRIM) 400-80 MG per tablet 1 tablet     1 tablet Oral Every 12 hours 11/21/2018 1922           ASSESSMENT AND PLAN SYNOPSIS   Severe ACUTE Hypoxic and Hypercapnic Respiratory Failure from acute systolic CHF with end stage PULM fibrosis Continue oxygen as needed -continue Bronchodilator Therapy -Wean Fio2 as tolerated Continue systemic steroids  ACUTE SYSTOLIC CARDIAC FAILURE- EF 25% -oxygen as needed -Lasix as tolerated -follow up cardiac enzymes as indicated -follow up cardiology recs   NEUROLOGY Alert and  awake   CARDIAC ICU monitoring  ID -abx as prescibed -follow up cultures  GI GI PROPHYLAXIS as indicated  NUTRITIONAL STATUS DIET-->as tolerated Constipation protocol as indicated  ENDO - will use ICU hypoglycemic\Hyperglycemia protocol if indicated   ELECTROLYTES -follow labs as needed -replace as needed -pharmacy consultation and following   DVT/GI PRX ordered TRANSFUSIONS AS NEEDED MONITOR FSBS ASSESS the need for LABS as needed   Critical Care Time devoted to patient care services described in this note is 32 minutes.   Overall, patient is critically ill, prognosis is guarded.   high risk for cardiac arrest and death.   Patient is DNR/DNI Palliative care to follow Patient will need Hospice   Harlea Goetzinger Patricia Pesa, M.D.  [facility]  Medical Director [place] Director [facility] [place] Cardio-Pulmonary Department

## 2018-12-03 NOTE — Consult Note (Signed)
[place] for Warfarin Indication: atrial fibrillation  Patient Measurements: Height: 6\' 2"  (188 cm) Weight: 174 lb 13.2 oz (79.3 kg) IBW/kg (Calculated) : 82.2  Vital Signs: BP: 120/61 (06/02 0600) Pulse Rate: 81 (06/02 0600)  Labs: Recent Labs    12/01/18 0529 12/02/18 0518 12/03/18 0243  HGB  --  10.8* 10.4*  HCT  --  34.4* 31.3*  PLT  --  PLATELET CLUMPS NOTED ON SMEAR, UNABLE TO ESTIMATE 132*  LABPROT 25.7* 28.0* 31.1*  INR 2.4* 2.7* 3.1*  CREATININE  --  1.18 1.08    Estimated Creatinine Clearance: 51 mL/min (by C-G formula based on SCr of 1.08 mg/dL).   Medical History: Past Medical History:  Diagnosis Date  . Abdominal aortic aneurysm ([place]) 01-2004   4.7 x 4.7 cm.  Marland Kitchen CAD (coronary artery disease) 01/25/2004   a. 01/2004 Ant MI/DES to LAD;  b. 10/2011 Neg MV.  . Chronic systolic heart failure ([place])    a. 08/2016 Echo: EF 35%, nl RV fxn, mod to sev MR, mild to mod TR, mod biatrial enlargement.  . Hyperlipidemia   . Hypertension   . Ischemic cardiomyopathy    a. 08/2016 Echo: EF 35%.  . Moderate to Severe Mitral regurgitation    a. 08/2016 Echo: Mod-sev MR.  Marland Kitchen Permanent atrial fibrillation    a. Chronic coumadin (CHA2DS2VASc = 5).  . Pulmonary nodules    Noted on abdominal CT  . Statin intolerance     Medications:  PTA dose 1mg  Warfarin qd (5/26 note from [facility] - primary care provider to increase dose to 2mg  qd)  Assessment: Permanent A Fib with CHA2DS2VASc score of 5.  H&H, PLT trending down slightly, will continue to monitor  Progressing cautiously because of drug interaction with Bactrim  DATE INR DOSE 5/27 1.4 2mg   + Bactrim 5/28 1.3 2mg  5/29 1.8 2mg  5/30 2.1 1.5mg  5/31 2.4 1 mg 6/01  2.7 0.5 mg 6/02 3.1 HOLD  Goal of Therapy:  INR 2-3 Monitor platelets by anticoagulation protocol: Yes   Plan:  --INR is supratherapeutic: hold warfarin tonight  --Will check INR daily while on antibiotics and  CBC a minimum of every 3 days while on warfarin.  Dallie Piles, PharmD Clinical Pharmacist 12/03/2018 8:20 AM

## 2018-12-03 NOTE — Progress Notes (Signed)
Pharmacy Electrolyte Monitoring Consult:  Pharmacy consulted to assist in monitoring and replacing electrolytes in this 85 y.o. male admitted on 11/09/2018 with respiratory failure/CHF exacerbation. Patient with past medical history significant for heart failure, atrial fibrillation, hypertension, abdominal aortic aneurysm, hyperlipidemia, and cardiomyopathy.   Labs:  Sodium (mmol/L)  Date Value  12/03/2018 131 (L)  07/05/2018 141  09/04/2011 142   Potassium (mmol/L)  Date Value  12/03/2018 4.2  09/04/2011 4.1   Magnesium (mg/dL)  Date Value  12/02/2018 2.3  09/04/2011 1.8   Phosphorus (mg/dL)  Date Value  12/25/2017 2.5   Calcium (mg/dL)  Date Value  12/03/2018 7.8 (L)   Calcium, Total (mg/dL)  Date Value  09/04/2011 8.8   Albumin (g/dL)  Date Value  11/23/2018 2.9 (L)  12/25/2017 4.3  09/03/2011 3.5   Corrected Ca: 8.68 mg/dL  Assessment/Plan: Patient on home dose of digoxin 0.0625 mg PO daily. He received a total of 60mg  IV Lasix yesterday  Due to extensive cardiac history, will replace for goal potassium ~ 4 and goal magnesium ~ 2.   Potassium is wnl but trending down following Lasix on 6/1: will give a small dose of oral KCl 20 mEq today  Pharmacy will continue to monitor and adjust per consult.    Dallie Piles, PharmD 12/03/2018 8:31 AM

## 2018-12-03 NOTE — Progress Notes (Signed)
[facility] [place] NAME: Joseph Barton    MR#:  [id number]  DATE OF BIRTH:  Feb 07, 1927  SUBJECTIVE:   Chief Complaint  Patient presents with  . Shortness of Breath   Patient continues to require high flow oxygen  REVIEW OF SYSTEMS:  ROS Constitutional: Negative for chills, fever, malaise/fatigue and weight loss.  HENT: Positive for hearing loss. Negative for congestion and sore throat.   Eyes: Negative for blurred vision and double vision.  Respiratory: Positive for shortness of breath and wheezing. Negative for cough, hemoptysis and sputum production.   Cardiovascular: Negative for chest pain, palpitations, orthopnea and leg swelling.       Irregular heart rhythm  Gastrointestinal: Negative for abdominal pain, diarrhea, nausea and vomiting.  Genitourinary: Negative for dysuria and urgency.  Musculoskeletal: Negative for myalgias.  Skin: Negative for rash.  Neurological: Negative for dizziness, sensory change, speech change, focal weakness and headaches.  Psychiatric/Behavioral: Negative for depression.  DRUG ALLERGIES:   Allergies  Allergen Reactions  . Atorvastatin   . Simvastatin    VITALS:  Blood pressure (!) 90/59, pulse 62, temperature 97.8 F (36.6 C), temperature source Oral, resp. rate (!) 28, height 6\' 2"  (1.88 m), weight 79.3 kg, SpO2 100 %. PHYSICAL EXAMINATION:   GENERAL:  85 y.o.-year-old patient lying in the bed with no acute distress.  EYES: Pupils equal, round, reactive to light and accommodation. No scleral icterus. Extraocular muscles intact.  HEENT: Head atraumatic, normocephalic. Oropharynx and nasopharynx clear.  NECK:  Supple, no jugular venous distention. No thyroid enlargement, no tenderness.  LUNGS: Crackles bilaterally  cARDIOVASCULAR: S1, S2 normal. Irregular, no rubs, or gallops.  ABDOMEN: Soft, nontender, nondistended. Bowel sounds present. No organomegaly or mass.  EXTREMITIES: No pedal edema,  cyanosis, or clubbing.  NEUROLOGIC: Cranial nerves II through XII are intact. Muscle strength 5/5 in all extremities. Sensation intact. Gait not checked.  PSYCHIATRIC: The patient is alert and oriented x 3.  SKIN: No obvious rash, lesion, or ulcer.   DATA REVIEWED:  LABORATORY PANEL:  Male CBC Recent Labs  Lab 12/03/18 0243  WBC 10.6*  HGB 10.4*  HCT 31.3*  PLT 132*   ------------------------------------------------------------------------------------------------------------------ Chemistries  Recent Labs  Lab 11/18/2018 0603  12/02/18 0518 12/03/18 0243  NA 137   < > 132* 131*  K 4.4   < > 5.1 4.2  CL 99   < > 102 97*  CO2 26   < > 21* 24  GLUCOSE 106*   < > 117* 98  BUN 25*   < > 35* 37*  CREATININE 1.35*   < > 1.18 1.08  CALCIUM 8.5*   < > 8.2* 7.8*  MG 1.8   < > 2.3  --   AST 23  --   --   --   ALT 23  --   --   --   ALKPHOS 66  --   --   --   BILITOT 0.7  --   --   --    < > = values in this interval not displayed.   RADIOLOGY:  Dg Chest Port 1 View  Result Date: 12/01/2018 CLINICAL DATA:  85 year old male with acute respiratory failure. EXAM: PORTABLE CHEST 1 VIEW COMPARISON:  Chest radiograph dated 11/29/2018, chest CT dated 10/23/2018 and 07/15/2018. FINDINGS: Diffuse interstitial and airspace densities similar to the prior radiograph. No new consolidative changes. No pneumothorax. Overall no interval change in the appearance of the lungs  since the radiograph of 11/29/2018. There is stable cardiomegaly. Atherosclerotic calcification of the aortic arch. Osteopenia with degenerative changes of the spine. No acute osseous pathology. IMPRESSION: No significant interval change in the appearance of the lungs compared to the prior radiograph. Bilateral interstitial and airspace opacities. Electronically Signed   By: Anner Crete M.D.   On: 12/01/2018 23:37   ASSESSMENT AND PLAN:   85 y.o. male with a known history of ischemic cardiomyopathy, atrial fibrillation on  Coumadin, hyperlipidemia, chronic systolic heart failure, MI, CAD status post percutaneous coronary angioplasty, AAA, amiodarone-induced lung injury, and hypertension presenting with worsening shortness of breath.  1. Acute on chronic hypoxic respiratory failure - felt to be due to amiodarone induced lung injury also with CHF on chronic home oxygen at 2 L.  Try to wean down oxygen-very hard to wean patient down  2. Acute on chronic diastolic Congestive Heart Failure-BNP remains elevated  Last Echo 10/23/18,  EF 25 to 30% -Lasix on hold due to low blood pressure Continue digoxin  3. Elevated troponin -Likely demand ischemia - Improved - EKG reviewed as above  4. Chronic atrial fibrillation - noted with episodes of RVR -Hold antihypertension - Continue Coumadin  - Coumadin management per pharmacy  5. Hypertension -blood pressure on hold  6. DVT prophylaxis -on Coumadin    CODE STATUS: DNR prognosis very poor  TOTAL TIME TAKING CARE OF THIS PATIENT: 60min.      on 12/03/2018 at 2:50 PM    Between 7am to 6pm - Pager - [phone]  After 6pm go to [email or web address] - password [email or web address] [facility]  [employer]  Hospitalists  Office  [phone]  CC: Primary care physician; Jerrol Banana., MD  Note: This dictation was prepared with Dragon dictation along with smaller phrase technology. Any transcriptional errors that result from this process are unintentional.

## 2018-12-04 ENCOUNTER — Ambulatory Visit: Payer: Medicare Other | Admitting: Family Medicine

## 2018-12-04 LAB — BASIC METABOLIC PANEL
Anion gap: 11 (ref 5–15)
BUN: 32 mg/dL — ABNORMAL HIGH (ref 8–23)
CO2: 23 mmol/L (ref 22–32)
Calcium: 8.1 mg/dL — ABNORMAL LOW (ref 8.9–10.3)
Chloride: 98 mmol/L (ref 98–111)
Creatinine, Ser: 1 mg/dL (ref 0.61–1.24)
GFR calc Af Amer: >60 mL/min (ref >60)
GFR calc non Af Amer: >60 mL/min (ref >60)
Glucose, Bld: 175 mg/dL — ABNORMAL HIGH (ref 70–99)
Potassium: 4.7 mmol/L (ref 3.5–5.1)
Sodium: 132 mmol/L — ABNORMAL LOW (ref 135–145)

## 2018-12-04 LAB — CBC
HCT: 35.4 % — ABNORMAL LOW (ref 39.0–52.0)
Hemoglobin: 11.6 g/dL — ABNORMAL LOW (ref 13.0–17.0)
MCH: 29.9 pg (ref 26.0–34.0)
MCHC: 32.8 g/dL (ref 30.0–36.0)
MCV: 91.2 fL (ref 80.0–100.0)
Platelets: 128 10*3/uL — ABNORMAL LOW (ref 150–400)
RBC: 3.88 MIL/uL — ABNORMAL LOW (ref 4.22–5.81)
RDW: 15.6 % — ABNORMAL HIGH (ref 11.5–15.5)
WBC: 8.4 10*3/uL (ref 4.0–10.5)
nRBC: 0 % (ref 0.0–0.2)

## 2018-12-04 LAB — PROTIME-INR
INR: 2.3 — ABNORMAL HIGH (ref 0.8–1.2)
Prothrombin Time: 25.2 seconds — ABNORMAL HIGH (ref 11.4–15.2)

## 2018-12-04 LAB — BRAIN NATRIURETIC PEPTIDE: B Natriuretic Peptide: 1366 pg/mL — ABNORMAL HIGH (ref 0.0–100.0)

## 2018-12-04 LAB — MRSA PCR SCREENING: MRSA by PCR: POSITIVE — AB

## 2018-12-04 MED ORDER — LORAZEPAM 2 MG/ML IJ SOLN
2.0000 mg | INTRAMUSCULAR | Status: DC | PRN
  Administered 2018-12-04 – 2018-12-05 (×2): 2 mg via INTRAVENOUS
  Filled 2018-12-04 (×2): qty 1

## 2018-12-04 MED ORDER — MUPIROCIN 2 % EX OINT
TOPICAL_OINTMENT | Freq: Two times a day (BID) | CUTANEOUS | Status: DC
  Administered 2018-12-04: 14:00:00 via NASAL
  Filled 2018-12-04: qty 22

## 2018-12-04 MED ORDER — WARFARIN SODIUM 2 MG PO TABS
2.0000 mg | ORAL_TABLET | Freq: Once | ORAL | Status: AC
  Administered 2018-12-04: 2 mg via ORAL
  Filled 2018-12-04: qty 1

## 2018-12-04 MED ORDER — MORPHINE 100MG IN NS 100ML (1MG/ML) PREMIX INFUSION
1.0000 mg/h | INTRAVENOUS | Status: DC
  Administered 2018-12-04: 1 mg/h via INTRAVENOUS
  Administered 2018-12-05: 3.5 mg/h via INTRAVENOUS
  Filled 2018-12-04: qty 100

## 2018-12-04 MED ORDER — MORPHINE SULFATE (PF) 2 MG/ML IV SOLN
2.0000 mg | INTRAVENOUS | Status: DC | PRN
  Administered 2018-12-04: 2 mg via INTRAVENOUS
  Filled 2018-12-04: qty 1

## 2018-12-04 MED ORDER — WARFARIN SODIUM 1 MG PO TABS
1.5000 mg | ORAL_TABLET | Freq: Once | ORAL | Status: DC
  Filled 2018-12-04: qty 1

## 2018-12-04 NOTE — Progress Notes (Signed)
PT Cancellation Note  Patient Details Name: MOMODOU CONSIGLIO MRN: [id number] DOB: 1926/11/29   Cancelled Treatment:    Reason Eval/Treat Not Completed: Medical issues which prohibited therapy. Per notes, pt transitioning to comfort care. Will dc current PT orders.   Abhi Moccia 12/04/2018, 4:41 PM Greggory Stallion, PT, DPT [phone]

## 2018-12-04 NOTE — Progress Notes (Signed)
CRITICAL CARE NOTE  CC  follow up respiratory failure  SUBJECTIVE Patient remains critically ill Prognosis is guarded Severe hypoxia Placed on high flow [place] DNR/DNI Prognosis is very poor  BP 96/66   Pulse (!) 50   Temp 97.9 F (36.6 C)   Resp 19   Ht 6\' 2"  (1.88 m)   Wt 79.3 kg   SpO2 100%   BMI 22.45 kg/m    I/O last 3 completed shifts: In: 860 [P.O.:860] Out: 2800 [Urine:2800] No intake/output data recorded.  SpO2: 100 % O2 Flow Rate (L/min): 15 L/min FiO2 (%): 65 %     REVIEW OF SYSTEMS  PATIENT IS UNABLE TO PROVIDE COMPLETE REVIEW OF SYSTEMS DUE TO SEVERE CRITICAL ILLNESS   PHYSICAL EXAMINATION:  GENERAL:critically ill appearing, +resp distress HEAD: Normocephalic, atraumatic.  EYES: Pupils equal, round, reactive to light.  No scleral icterus.  MOUTH: Moist mucosal membrane. NECK: Supple. No thyromegaly. No nodules. No JVD.  PULMONARY: +rhonchi, +wheezing CARDIOVASCULAR: S1 and S2. Regular rate and rhythm. No murmurs, rubs, or gallops.  GASTROINTESTINAL: Soft, nontender, -distended. No masses. Positive bowel sounds. No hepatosplenomegaly.  MUSCULOSKELETAL: No swelling, clubbing, or edema.  NEUROLOGIC: lethargic SKIN:intact,warm,dry  MEDICATIONS: I have reviewed all medications and confirmed regimen as documented   CULTURE RESULTS   Recent Results (from the past 240 hour(s))  SARS Coronavirus 2 (CEPHEID- Performed in [place] hospital lab), Hosp Order     Status: None   Collection Time: 11/02/2018  6:03 AM  Result Value Ref Range Status   SARS Coronavirus 2 NEGATIVE NEGATIVE Final    Comment: (NOTE) If result is NEGATIVE SARS-CoV-2 target nucleic acids are NOT DETECTED. The SARS-CoV-2 RNA is generally detectable in upper and lower  respiratory specimens during the acute phase of infection. The lowest  concentration of SARS-CoV-2 viral copies this assay can detect is 250  copies / mL. A negative result does not preclude SARS-CoV-2 infection   and should not be used as the sole basis for treatment or other  patient management decisions.  A negative result may occur with  improper specimen collection / handling, submission of specimen other  than nasopharyngeal swab, presence of viral mutation(s) within the  areas targeted by this assay, and inadequate number of viral copies  (<250 copies / mL). A negative result must be combined with clinical  observations, patient history, and epidemiological information. If result is POSITIVE SARS-CoV-2 target nucleic acids are DETECTED. The SARS-CoV-2 RNA is generally detectable in upper and lower  respiratory specimens dur ing the acute phase of infection.  Positive  results are indicative of active infection with SARS-CoV-2.  Clinical  correlation with patient history and other diagnostic information is  necessary to determine patient infection status.  Positive results do  not rule out bacterial infection or co-infection with other viruses. If result is PRESUMPTIVE POSTIVE SARS-CoV-2 nucleic acids MAY BE PRESENT.   A presumptive positive result was obtained on the submitted specimen  and confirmed on repeat testing.  While 2019 novel coronavirus  (SARS-CoV-2) nucleic acids may be present in the submitted sample  additional confirmatory testing may be necessary for epidemiological  and / or clinical management purposes  to differentiate between  SARS-CoV-2 and other Sarbecovirus currently known to infect humans.  If clinically indicated additional testing with an alternate test  methodology [phone]) is advised. The SARS-CoV-2 RNA is generally  detectable in upper and lower respiratory sp ecimens during the acute  phase of infection. The expected result is Negative. Fact  Sheet for Patients:  StrictlyIdeas.no Fact Sheet for Healthcare Providers: BankingDealers.co.za This test is not yet approved or cleared by the [place] FDA  and has been authorized for detection and/or diagnosis of SARS-CoV-2 by FDA under an Emergency Use Authorization (EUA).  This EUA will remain in effect (meaning this test can be used) for the duration of the COVID-19 declaration under Section 564(b)(1) of the Act, 21 U.S.C. section 360bbb-3(b)(1), unless the authorization is terminated or revoked sooner. Performed at [facility], [address] [place], [place] [address]   Urine culture     Status: None   Collection Time: 11/15/2018  6:03 AM  Result Value Ref Range Status   Specimen Description   Final    URINE, RANDOM Performed at [facility], [address], [place], [place] [address]    Special Requests   Final    NONE Performed at [facility], [address], [place], [place] [address]    Culture   Final    NO GROWTH Performed at [facility], [place] [address] [place], [place] [address]    Report Status 11/28/2018 FINAL  Final          IMAGING    Dg Chest Port 1 View  Result Date: 12/03/2018 CLINICAL DATA:  Acute respiratory distress. EXAM: PORTABLE CHEST 1 VIEW COMPARISON:  12/01/2018 FINDINGS: Heart size is increased. There is worsening aeration, with decreased lung volumes, and increased opacity on a background of interstitial fibrotic change suggesting edema. BILATERAL effusions are noted. Degenerative change both shoulders. IMPRESSION: Worsening aeration.  Query congestive heart failure. Electronically Signed   By: Staci Righter M.D.   On: 12/03/2018 21:40           ASSESSMENT AND PLAN SYNOPSIS  Severe ACUTE Hypoxic and Hypercapnic Respiratory Failure from acute systolic CHF with end stage PULM fibrosis   Severe ACUTE Hypoxic and Hypercapnic Respiratory Failure -continue high flow [place] -continue Bronchodilator Therapy IV steroids IV lasix as tolerated  ACUTE SYSTOLIC CARDIAC FAILURE- EF 25% -oxygen as needed -Lasix as tolerated -follow up cardiac enzymes as  indicated   CARDIAC ICU monitoring   GI GI PROPHYLAXIS as indicated  NUTRITIONAL STATUS DIET--> as tolerated Constipation protocol as indicated  ENDO - will use ICU hypoglycemic\Hyperglycemia protocol if indicated   ELECTROLYTES -follow labs as needed -replace as needed -pharmacy consultation and following   DVT/GI PRX ordered TRANSFUSIONS AS NEEDED MONITOR FSBS ASSESS the need for LABS as needed   Critical Care Time devoted to patient care services described in this note is 31  minutes.   Overall, patient is critically ill, prognosis is guarded.  Patient with Multiorgan failure and at high risk for cardiac arrest and death.   Palliative care team following  I recommend Comfort care measures  Briannia Laba Patricia Pesa, M.D.  [facility]  Medical Director [place] Director [facility] Cardio-Pulmonary Department

## 2018-12-04 NOTE — Progress Notes (Signed)
Family At bedside, clinical status relayed to family  Updated and notified of patients medical condition-  Progressive multiorgan failure with very low chance of meaningful recovery.  Patient is in dying  Process.  Family understands the situation and patient is suffereing  They have consented and agreed to DNR/DNI and would like to proceed with Comfort care measures.   Family are satisfied with Plan of action and management. All questions answered  Plan Once Son in law arrives, will proceed with comfort care measures when they are ready Will proceed with morphine infusion first, then remove high flow  and no oxygen therapy.   Corrin Jumper, M.D.  [facility]  Medical Director [place] Director [facility] Cardio-Pulmonary Department

## 2018-12-04 NOTE — Progress Notes (Signed)
Family at bedside with pt.

## 2018-12-04 NOTE — Progress Notes (Signed)
Patient had incident of high anxiety and SOB at approximately 2100 overnight. Patient was placed on rescue bipap, and given prn morphine paired with solumedrol and lasix to improve work of breathing. On two concurrent reassessments patient endorsed "feeling much better" and requested to be removed from bipap. With the guidance of Darlyn Chamber, NP patient was placed back on heated high flow nasal cannula. Remainder of shift has been uneventful, patient now resting peacefully with no signs of distress.

## 2018-12-04 NOTE — Progress Notes (Addendum)
Daily Progress Note   Patient Name: Joseph Barton       Date: 12/04/2018 DOB: 1926/08/03  Age: 85 y.o. MRN#: [id number] Attending Physician: Dustin Flock, MD Primary Care Physician: Jerrol Banana., MD Admit Date: 11/04/2018  Reason for Consultation/Follow-up: Establishing goals of care  Subjective: Patient is sitting up in bed dyspnic on high flow cannula. He states he just finished eating and tried to scoot over in bed. We discussed QOL. He states " I feel like I'm going home to die." he states "if the [facility] is ready to take me home, I'm okay with that." We discussed his high flow O2, concern for symptom management to keep him comfortable, and transport home. He stated "I want to die at home". He states it is more important to have his family present at death than to be at his home. Given this, I recommended against going home with hospice as with his frailty, there is a very high risk of death enroute to home, in which his family would not be present nor would he be at home. He is also using high flow cannula. He states he will speak to his wife tonight to gather her thoughts after he has had a chance to think about all we discussed. His wife called and he told her he would call her back, and also told her I would call to speak with her.    Spoke with his wife via phone. She states she has been updated and understands that her husband has a very poor prognosis. She states she does not want to see him suffer and is ready to shift to comfort care this evening or tomorrow depending on her conversation with him today. She states she saw him yesterday and he appeared uncomfortable and frail.   Back to room to speak to patient to tell him I had called his wife, he was on the phone with her upon my  arrival. Daughter and wife approved by CCM to come to bedside. He states he is tired and feels like he is ready to transition to comfort. Wife states the family will come once son in law arrives from [place]. CCM aware.     Length of Stay: 7  Current Medications: Scheduled Meds:  . digoxin  0.125 mg Oral Daily  .  docusate sodium  200 mg Oral BID  . fluticasone  1 spray Each Nare Daily  . midodrine  10 mg Oral TID WC  . multivitamin with minerals  1 tablet Oral Daily  . mupirocin ointment   Nasal BID  . pantoprazole  40 mg Oral Daily  . predniSONE  40 mg Oral Q breakfast  . senna  1 tablet Oral Daily  . tamsulosin  0.4 mg Oral Daily  . warfarin  2 mg Oral ONCE-1800  . Warfarin - Pharmacist Dosing Inpatient   Does not apply q1800    Continuous Infusions:   PRN Meds: bisacodyl, guaiFENesin-dextromethorphan, morphine injection  Physical Exam Pulmonary:     Comments: On high flow [place] Skin:    General: Skin is warm and dry.  Neurological:     Mental Status: He is alert.             Vital Signs: BP 98/65   Pulse 62   Temp 98.2 F (36.8 C) (Oral)   Resp 15   Ht 6\' 2"  (1.88 m)   Wt 79.3 kg   SpO2 96%   BMI 22.45 kg/m  SpO2: SpO2: 96 % O2 Device: O2 Device: High Flow Nasal Cannula O2 Flow Rate: O2 Flow Rate (L/min): 50 L/min  Intake/output summary:   Intake/Output Summary (Last 24 hours) at 12/04/2018 1422 Last data filed at 12/04/2018 0600 Gross per 24 hour  Intake 340 ml  Output 1400 ml  Net -1060 ml   LBM: Last BM Date: 11/28/18 Baseline Weight: Weight: 81.6 kg Most recent weight: Weight: 79.3 kg       Palliative Assessment/Data: 30%    Flowsheet Rows     Most Recent Value  Intake Tab  Referral Department  Hospitalist  Unit at Time of Referral  Cardiac/Telemetry Unit  Palliative Care Primary Diagnosis  Pulmonary  Date Notified  11/29/18  Palliative Care Type  New Palliative care  Reason for referral  Clarify Goals of Care  Date of Admission  11/23/2018   Date first seen by Palliative Care  11/29/18  # of days Palliative referral response time  0 Day(s)  # of days IP prior to Palliative referral  2  Clinical Assessment  Palliative Performance Scale Score  50%  Psychosocial & Spiritual Assessment  Palliative Care Outcomes  Patient/Family meeting held?  Yes  Who was at the meeting?  patient and wife  Palliative Care Outcomes  Clarified goals of care, Provided end of life care assistance, Provided psychosocial or spiritual support, ACP counseling assistance      Patient Active Problem List   Diagnosis Date Noted  . Palliative care by specialist   . Goals of care, counseling/discussion   . Amiodarone pulmonary toxicity   . Acute on chronic respiratory failure with hypoxia ([place])   . Acute on chronic systolic heart failure ([place])   . HCAP (healthcare-associated pneumonia) 10/20/2018  . Malnutrition of moderate degree 07/18/2018  . Pneumonia 07/15/2018  . Hypotension 12/12/2017  . CHF (congestive heart failure) ([place]) 2020/04/2918  . Subclinical hypothyroidism 10/06/2015  . Allergic rhinitis 09/10/2015  . Bradycardia 09/10/2015  . Failure of erection 09/10/2015  . Blood glucose elevated 09/10/2015  . BP (high blood pressure) 09/10/2015  . Neuropathy 09/10/2015  . Apnea, sleep 09/10/2015  . Cancer of skin, squamous cell 09/10/2015  . Acid reflux 04/28/2015  . Arthritis, degenerative 12/25/2014  . Cardiomyopathy, ischemic 09/27/2013  . HLD (hyperlipidemia) 09/27/2013  . Lung nodule, multiple 09/27/2013  . Drug intolerance 09/27/2013  .  Ischemic cardiomyopathy 12/01/2011  . Atrial fibrillation ([place]) 12/01/2011  . Chronic systolic heart failure ([place]) 12/01/2011  . IVCD (intraventricular conduction defect) 12/01/2011  . Intraventricular block 12/01/2011  . 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 CAD S/P percutaneous coronary angioplasty 01/25/2004  . Myocardial infarction ([place]) 01/25/2004  . AAA (abdominal aortic  aneurysm) ([place]) 01/01/2004    Palliative Care Assessment & Plan    Recommendations/Plan: Plans to shift to comfort care this evening once all family arrives.    Confirms DNR/DNI.    Code Status:    Code Status Orders  (From admission, onward)         Start     Ordered   11/18/2018 0922  Do not attempt resuscitation (DNR)  Continuous    Question Answer Comment  In the event of cardiac or respiratory ARREST Do not call a "code blue"   In the event of cardiac or respiratory ARREST Do not perform Intubation, CPR, defibrillation or ACLS   In the event of cardiac or respiratory ARREST Use medication by any route, position, wound care, and other measures to relive pain and suffering. May use oxygen, suction and manual treatment of airway obstruction as needed for comfort.   Comments Discussed with patient directly with multiple ED RN witnesses. Patient does not want intubation nor chest compressions.      11/25/2018 0923        Code Status History    Date Active Date Inactive Code Status Order ID Comments User Context   11/02/2018 0631 11/22/2018 0923 DNR [id number]  Hinda Kehr, MD ED   10/20/2018 2118 11/01/2018 2157 DNR [id number]  Saundra Shelling, MD Inpatient   07/16/2018 1139 07/20/2018 1713 DNR [id number]  Saundra Shelling, MD ED   07/16/2018 0451 07/16/2018 1139 Full Code [id number]  Gladstone Lighter, MD ED   12/04/2017 1517 12/06/2017 1916 Partial Code [id number]  Salary, Avel Peace, MD Inpatient    Advance Directive Documentation     Most Recent Value  Type of Advance Directive  Healthcare Power of Attorney, Living will  Pre-existing out of facility DNR order (yellow form or pink MOST form)  -  "MOST" Form in Place?  -       Prognosis:   Unable to determine  Discharge Planning:  To Be Determined  Care plan was discussed with RN, CM  Thank you for allowing the Palliative Medicine Team to assist in the care of this patient.   Total Time 50 min Prolonged Time Billed No       Greater than 50%  of this time was spent counseling and coordinating care related to the above assessment and plan.  Asencion Gowda, NP  Please contact Palliative Medicine Team phone at [phone] for questions and concerns.

## 2018-12-04 NOTE — Progress Notes (Signed)
[place] [facility] [place] NAME: Joseph Barton    MR#:  [id number]  DATE OF BIRTH:  [birth year]  SUBJECTIVE:   Chief Complaint  Patient presents with  . Shortness of Breath   Patient continues to be very short of breath no sign of improvement  REVIEW OF SYSTEMS:  ROS Constitutional: Negative for chills, fever, malaise/fatigue and weight loss.  HENT: Positive for hearing loss. Negative for congestion and sore throat.   Eyes: Negative for blurred vision and double vision.  Respiratory: Positive for shortness of breath and wheezing. Negative for cough, hemoptysis and sputum production.   Cardiovascular: Negative for chest pain, palpitations, orthopnea and leg swelling.       Irregular heart rhythm  Gastrointestinal: Negative for abdominal pain, diarrhea, nausea and vomiting.  Genitourinary: Negative for dysuria and urgency.  Musculoskeletal: Negative for myalgias.  Skin: Negative for rash.  Neurological: Negative for dizziness, sensory change, speech change, focal weakness and headaches.  Psychiatric/Behavioral: Negative for depression.  DRUG ALLERGIES:   Allergies  Allergen Reactions  . Atorvastatin   . Simvastatin    VITALS:  Blood pressure (!) 88/54, pulse 64, temperature 98.2 F (36.8 C), temperature source Oral, resp. rate (!) 22, height 6\' 2"  (1.88 m), weight 79.3 kg, SpO2 96 %. PHYSICAL EXAMINATION:   GENERAL:  85 y.o.-year-old patient lying in the bed with no acute distress.  EYES: Pupils equal, round, reactive to light and accommodation. No scleral icterus. Extraocular muscles intact.  HEENT: Head atraumatic, normocephalic. Oropharynx and nasopharynx clear.  NECK:  Supple, no jugular venous distention. No thyroid enlargement, no tenderness.  LUNGS: Crackles bilaterally  cARDIOVASCULAR: S1, S2 normal. Irregular, no rubs, or gallops.  ABDOMEN: Soft, nontender, nondistended. Bowel sounds present. No organomegaly or mass.   EXTREMITIES: No pedal edema, cyanosis, or clubbing.  NEUROLOGIC: Cranial nerves II through XII are intact. Muscle strength 5/5 in all extremities. Sensation intact. Gait not checked.  PSYCHIATRIC: The patient is alert and oriented x 3.  SKIN: No obvious rash, lesion, or ulcer.   DATA REVIEWED:  LABORATORY PANEL:  Male CBC Recent Labs  Lab 12/04/18 0442  WBC 8.4  HGB 11.6*  HCT 35.4*  PLT 128*   ------------------------------------------------------------------------------------------------------------------ Chemistries  Recent Labs  Lab 12/02/18 0518  12/04/18 0442  NA 132*   < > 132*  K 5.1   < > 4.7  CL 102   < > 98  CO2 21*   < > 23  GLUCOSE 117*   < > 175*  BUN 35*   < > 32*  CREATININE 1.18   < > 1.00  CALCIUM 8.2*   < > 8.1*  MG 2.3  --   --    < > = values in this interval not displayed.   RADIOLOGY:  Dg Chest Port 1 View  Result Date: 12/03/2018 CLINICAL DATA:  Acute respiratory distress. EXAM: PORTABLE CHEST 1 VIEW COMPARISON:  12/01/2018 FINDINGS: Heart size is increased. There is worsening aeration, with decreased lung volumes, and increased opacity on a background of interstitial fibrotic change suggesting edema. BILATERAL effusions are noted. Degenerative change both shoulders. IMPRESSION: Worsening aeration.  Query congestive heart failure. Electronically Signed   By: Staci Righter M.D.   On: 12/03/2018 21:40   ASSESSMENT AND PLAN:   85 y.o. male with a known history of ischemic cardiomyopathy, atrial fibrillation on Coumadin, hyperlipidemia, chronic systolic heart failure, MI, CAD status post percutaneous coronary angioplasty, AAA, amiodarone-induced lung injury, and hypertension  presenting with worsening shortness of breath.  1. Acute on chronic hypoxic respiratory failure - felt to be due to amiodarone induced lung injury also with CHF on chronic home oxygen at 2 L.  Unable to wean patient down palliative care team is following the patient  2. Acute  on chronic diastolic Congestive Heart Failure-BNP remains elevated  Last Echo 10/23/18,  EF 25 to 30% -Lasix on hold due to low blood pressure Continue digoxin  3. Elevated troponin -Likely demand ischemia - Improved - EKG reviewed as above  4. Chronic atrial fibrillation - noted with episodes of RVR -Hold antihypertension - Continue Coumadin  - Coumadin management per pharmacy  5. Hypertension -blood pressure on hold  6. DVT prophylaxis -on Coumadin    CODE STATUS: DNR prognosis very poor  TOTAL TIME TAKING CARE OF THIS PATIENT: 65min.      on 12/04/2018 at 3:37 PM    Between 7am to 6pm - Pager - [phone]  After 6pm go to [email or web address] - password [email or web address] [facility]  [facility] [place] [facility]  Office  [phone]  CC: Primary care physician; Jerrol Banana., MD  Note: This dictation was prepared with Dragon dictation along with smaller phrase technology. Any transcriptional errors that result from this process are unintentional.

## 2018-12-04 NOTE — Progress Notes (Signed)
Pharmacy Electrolyte Monitoring Consult:  Pharmacy consulted to assist in monitoring and replacing electrolytes in this 85 y.o. male admitted on 11/26/2018 with respiratory failure/CHF exacerbation. Patient with past medical history significant for heart failure, atrial fibrillation, hypertension, abdominal aortic aneurysm, hyperlipidemia, and cardiomyopathy.   Labs:  Sodium (mmol/L)  Date Value  12/04/2018 132 (L)  07/05/2018 141  09/04/2011 142   Potassium (mmol/L)  Date Value  12/04/2018 4.7  09/04/2011 4.1   Magnesium (mg/dL)  Date Value  12/02/2018 2.3  09/04/2011 1.8   Phosphorus (mg/dL)  Date Value  12/25/2017 2.5   Calcium (mg/dL)  Date Value  12/04/2018 8.1 (L)   Calcium, Total (mg/dL)  Date Value  09/04/2011 8.8   Albumin (g/dL)  Date Value  11/16/2018 2.9 (L)  12/25/2017 4.3  09/03/2011 3.5   Corrected Ca: 8.98 mg/dL  Assessment/Plan: Patient on home dose of digoxin 0.0625 mg PO daily. He received a total of 20mg  IV Lasix yesterday  --Due to extensive cardiac history, will replace for goal potassium ~ 4 and goal magnesium ~ 2.   --Potassium is wnl: no supplementation indicated today  --will order a f/u magnesium level for tomorrow morning  Pharmacy will continue to monitor and adjust per consult.   Dallie Piles, PharmD 12/04/2018 8:54 AM

## 2018-12-04 NOTE — Consult Note (Addendum)
[facility] for Warfarin Indication: atrial fibrillation  Patient Measurements: Height: 6\' 2"  (188 cm) Weight: 174 lb 13.2 oz (79.3 kg) IBW/kg (Calculated) : 82.2  Vital Signs: Temp: 97.9 F (36.6 C) (06/03 0300) BP: 96/66 (06/03 0600) Pulse Rate: 50 (06/03 0600)  Labs: Recent Labs    12/02/18 0518 12/03/18 0243 12/04/18 0442  HGB 10.8* 10.4* 11.6*  HCT 34.4* 31.3* 35.4*  PLT PLATELET CLUMPS NOTED ON SMEAR, UNABLE TO ESTIMATE 132* 128*  LABPROT 28.0* 31.1* 25.2*  INR 2.7* 3.1* 2.3*  CREATININE 1.18 1.08 1.00    Estimated Creatinine Clearance: 55.1 mL/min (by C-G formula based on SCr of 1 mg/dL).   Medical History: Past Medical History:  Diagnosis Date  . Abdominal aortic aneurysm ([place]) 01-2004   4.7 x 4.7 cm.  Marland Kitchen CAD (coronary artery disease) 01/25/2004   a. 01/2004 Ant MI/DES to LAD;  b. 10/2011 Neg MV.  . Chronic systolic heart failure ([place])    a. 08/2016 Echo: EF 35%, nl RV fxn, mod to sev MR, mild to mod TR, mod biatrial enlargement.  . Hyperlipidemia   . Hypertension   . Ischemic cardiomyopathy    a. 08/2016 Echo: EF 35%.  . Moderate to Severe Mitral regurgitation    a. 08/2016 Echo: Mod-sev MR.  Marland Kitchen Permanent atrial fibrillation    a. Chronic coumadin (CHA2DS2VASc = 5).  . Pulmonary nodules    Noted on abdominal CT  . Statin intolerance     Medications:  PTA dose 1mg  Warfarin qd (5/26 note from [facility] - primary care provider to increase dose to 2mg  qd)  Assessment: Permanent A Fib with CHA2DS2VASc score of 5.  PLT trending down slightly, will continue to monitor. Bactrim was discontinued 6/3  DATE INR DOSE 5/27 1.4 2mg   + Bactrim 5/28 1.3 2mg  5/29 1.8 2mg  5/30 2.1 1.5mg  5/31 2.4 1 mg 6/01  2.7 0.5 mg 6/02 3.1 HOLD 6/03 2.3 2 mg  Goal of Therapy:  INR 2-3 Monitor platelets by anticoagulation protocol: Yes   Plan:  --INR is therapeutic but with a significant decrease from yesterday's level: in order to  avoid a subtherapeutic INR will give give a  mg dose based on his previous response on this admission along with the fact that the Bactrim has been d/c --Will check INR daily while on antibiotics and CBC a minimum of every 3 days while on warfarin.  Dallie Piles, PharmD Clinical Pharmacist 12/04/2018 8:43 AM

## 2018-12-05 MED ORDER — WARFARIN SODIUM 2 MG PO TABS: 2.0000 mg | ORAL_TABLET | Freq: Once | ORAL | Status: DC

## 2018-12-05 MED ORDER — PROMETHAZINE HCL 25 MG/ML IJ SOLN: 12.5000 mg | INTRAMUSCULAR | Status: DC | PRN

## 2018-12-05 MED ORDER — ONDANSETRON HCL 4 MG/2ML IJ SOLN
INTRAMUSCULAR | Status: AC
  Filled 2018-12-05: qty 2

## 2018-12-05 MED ORDER — ONDANSETRON HCL 4 MG/2ML IJ SOLN
4.0000 mg | Freq: Four times a day (QID) | INTRAMUSCULAR | Status: DC | PRN
  Administered 2018-12-05: 4 mg via INTRAVENOUS

## 2018-12-09 ENCOUNTER — Telehealth: Payer: Self-pay | Admitting: Internal Medicine

## 2018-12-09 NOTE — Telephone Encounter (Signed)
Death Certificate received. Placed in DK's box for completion.

## 2018-12-11 ENCOUNTER — Ambulatory Visit: Payer: Medicare Other | Admitting: Family

## 2018-12-12 NOTE — Telephone Encounter (Signed)
Death certificate has been completed and placed up front for pickup.  [employer] [employer] [facility] is aware.

## 2018-12-15 DIAGNOSIS — I5023 Acute on chronic systolic (congestive) heart failure: Secondary | ICD-10-CM | POA: Diagnosis not present

## 2019-01-01 NOTE — Death Summary Note (Signed)
DEATH SUMMARY   Patient Details  Name: Joseph Barton MRN: [id number] DOB: 05-Apr-1927  Admission/Discharge Information   Admit Date:  12-07-2018  Date of Death: Date of Death: 2018/12/15  Time of Death: Time of Death: 0518  Length of Stay: 8  Referring Physician: Jerrol Banana., MD   Reason(s) for Hospitalization  Acute on Chronic Hypoxic Respiratory Failure Pulmonary Fibrosis Acute Decompensated HFrEF  Diagnoses  Preliminary cause of death:   Acute on Chronic Hypoxic Respiratory Failure  Secondary Diagnoses (including complications and co-morbidities):  Active Problems:   Acute on chronic respiratory failure with hypoxia (HCC)   Palliative care by specialist   Goals of care, counseling/discussion   Amiodarone pulmonary toxicity Pulmonary Fibrosis Acute Decompensated HFrEF  Brief Hospital Course (including significant findings, care, treatment, and services provided and events leading to death)  Joseph Barton is a 85 y.o. year old male with a past medical history notable for AAA, CAD, HFrEF (EF of 35%), hypertension, ischemic cardiomyopathy, severe mitral regurgitation, chronic atrial fibrillation, pulmonary embolism who presented to [facility] ED on 12/07/18 with complaints of shortness of breath and hypoxia as reported by his home health nurse, therefore he was referred to the ED. He was recently discharged last month from the hospital for Acute Hypoxic Respiratory Failure secondary to pulmonary fibrosis due to possible amiodarone toxicity.  His COVID-19 PCR was negative. He was admitted to the Med/surg unit on 12/07/2022 for further workup and treatment of Acute on Chronic Hypoxic Respiratory Failure in the setting of severe Pulmonary Fibrosis and Acute Decompensated HFrEF.  He was treated with Steroids and diuresed with IV Lasix.  On 5/30 he developed transient hypotension in the setting of diuresis, and hypoxia requiring transfer to ICU.  Given his end stage pulmonary fibrosis and HFrEF,  he continued to have severe hypoxia and increased work of breathing with minimal improvement in his respiratory status.  After discussions with the ICU team and with Palliative Care, the patient and his family chose to pursue comfort care measures.  He was transitioned to comfort care measures and placed on a morphine drip late in the evening on 6/3. He expired on 12/15/2022 at 0518.    Pertinent Labs and Studies  Significant Diagnostic Studies Dg Chest 1 View  Result Date: 11/29/2018 CLINICAL DATA:  Hypertension, CHF EXAM: CHEST  1 VIEW COMPARISON:  11/28/2018, 12-07-18 FINDINGS: Diffuse bilateral interstitial and patchy alveolar airspace opacities unchanged compared with December 07, 2018. No left pleural effusion. Possible trace right pleural effusion. No pneumothorax. Stable cardiomegaly. No aggressive osseous lesion. IMPRESSION: 1. Bilateral interstitial and alveolar airspace opacities throughout bilateral lungs concerning for multilobar pneumonia including atypical pneumonia. There is an element of underlying chronic interstitial lung disease. Electronically Signed   By: Kathreen Devoid   On: 11/29/2018 09:57   Dg Chest 1 View  Result Date: 11/28/2018 CLINICAL DATA:  Difficulty breathing. EXAM: CHEST  1 VIEW COMPARISON:  12-07-2018 FINDINGS: Heart size appears enlarged. Aortic atherosclerosis. Diffuse bilateral interstitial and airspace opacities identified throughout both lungs. When compared with the previous exam the aeration to the lungs appears unchanged. IMPRESSION: 1. No change in aeration a lungs compared with 2018-12-07. Electronically Signed   By: Kerby Moors M.D.   On: 11/28/2018 15:53   Dg Chest Port 1 View  Result Date: 12/03/2018 CLINICAL DATA:  Acute respiratory distress. EXAM: PORTABLE CHEST 1 VIEW COMPARISON:  12/01/2018 FINDINGS: Heart size is increased. There is worsening aeration, with decreased lung volumes, and increased opacity on a background  of interstitial fibrotic change  suggesting edema. BILATERAL effusions are noted. Degenerative change both shoulders. IMPRESSION: Worsening aeration.  Query congestive heart failure. Electronically Signed   By: Staci Righter M.D.   On: 12/03/2018 21:40   Dg Chest Port 1 View  Result Date: 12/01/2018 CLINICAL DATA:  85 year old male with acute respiratory failure. EXAM: PORTABLE CHEST 1 VIEW COMPARISON:  Chest radiograph dated 11/29/2018, chest CT dated 10/23/2018 and 07/15/2018. FINDINGS: Diffuse interstitial and airspace densities similar to the prior radiograph. No new consolidative changes. No pneumothorax. Overall no interval change in the appearance of the lungs since the radiograph of 11/29/2018. There is stable cardiomegaly. Atherosclerotic calcification of the aortic arch. Osteopenia with degenerative changes of the spine. No acute osseous pathology. IMPRESSION: No significant interval change in the appearance of the lungs compared to the prior radiograph. Bilateral interstitial and airspace opacities. Electronically Signed   By: Anner Crete M.D.   On: 12/01/2018 23:37   Dg Chest Portable 1 View  Result Date: 11/16/2018 CLINICAL DATA:  Shortness of breath with chronic lung injury EXAM: PORTABLE CHEST 1 VIEW COMPARISON:  10/31/2018 FINDINGS: Compared with priors there is chronic lung disease with superimposed acute airspace disease that is stable in extent. Amiodarone toxicity was noted per chart. No visible effusion or pneumothorax. Normal heart size. IMPRESSION: Acute on chronic airspace disease with unchanged opacity compared to 10/31/2018. Note that superimposed pneumonia or failure could easily be obscured by the extensive baseline opacity. Electronically Signed   By: Monte Fantasia M.D.   On: 11/22/2018 06:56    Microbiology Recent Results (from the past 240 hour(s))  SARS Coronavirus 2 (CEPHEID- Performed in [place] hospital lab), Hosp Order     Status: None   Collection Time: 11/26/2018  6:03 AM  Result Value  Ref Range Status   SARS Coronavirus 2 NEGATIVE NEGATIVE Final    Comment: (NOTE) If result is NEGATIVE SARS-CoV-2 target nucleic acids are NOT DETECTED. The SARS-CoV-2 RNA is generally detectable in upper and lower  respiratory specimens during the acute phase of infection. The lowest  concentration of SARS-CoV-2 viral copies this assay can detect is 250  copies / mL. A negative result does not preclude SARS-CoV-2 infection  and should not be used as the sole basis for treatment or other  patient management decisions.  A negative result may occur with  improper specimen collection / handling, submission of specimen other  than nasopharyngeal swab, presence of viral mutation(s) within the  areas targeted by this assay, and inadequate number of viral copies  (<250 copies / mL). A negative result must be combined with clinical  observations, patient history, and epidemiological information. If result is POSITIVE SARS-CoV-2 target nucleic acids are DETECTED. The SARS-CoV-2 RNA is generally detectable in upper and lower  respiratory specimens dur ing the acute phase of infection.  Positive  results are indicative of active infection with SARS-CoV-2.  Clinical  correlation with patient history and other diagnostic information is  necessary to determine patient infection status.  Positive results do  not rule out bacterial infection or co-infection with other viruses. If result is PRESUMPTIVE POSTIVE SARS-CoV-2 nucleic acids MAY BE PRESENT.   A presumptive positive result was obtained on the submitted specimen  and confirmed on repeat testing.  While 2019 novel coronavirus  (SARS-CoV-2) nucleic acids may be present in the submitted sample  additional confirmatory testing may be necessary for epidemiological  and / or clinical management purposes  to differentiate between  SARS-CoV-2 and other  Sarbecovirus currently known to infect humans.  If clinically indicated additional testing with an  alternate test  methodology [phone]) is advised. The SARS-CoV-2 RNA is generally  detectable in upper and lower respiratory sp ecimens during the acute  phase of infection. The expected result is Negative. Fact Sheet for Patients:  StrictlyIdeas.no Fact Sheet for Healthcare Providers: BankingDealers.co.za This test is not yet approved or cleared by the [place] FDA and has been authorized for detection and/or diagnosis of SARS-CoV-2 by FDA under an Emergency Use Authorization (EUA).  This EUA will remain in effect (meaning this test can be used) for the duration of the COVID-19 declaration under Section 564(b)(1) of the Act, 21 U.S.C. section 360bbb-3(b)(1), unless the authorization is terminated or revoked sooner. Performed at [facility], [address], [place], [place] [address]   Urine culture     Status: None   Collection Time: 11/19/2018  6:03 AM  Result Value Ref Range Status   Specimen Description   Final    URINE, RANDOM Performed at [facility], [address], [place], [place] [address]    Special Requests   Final    NONE Performed at [facility], [address] [place], [place] [address]    Culture   Final    NO GROWTH Performed at [facility], [place] [address] [place], [place] [address]    Report Status 11/28/2018 FINAL  Final  MRSA PCR Screening     Status: Abnormal   Collection Time: 12/03/18  2:35 PM  Result Value Ref Range Status   MRSA by PCR POSITIVE (A) NEGATIVE Final    Comment:        The GeneXpert MRSA Assay (FDA approved for NASAL specimens only), is one component of a comprehensive MRSA colonization surveillance program. It is not intended to diagnose MRSA infection nor to guide or monitor treatment for MRSA infections. RESULT CALLED TO, READ BACK BY AND VERIFIED WITH: BETH SMITH @0728  ON 12/04/18 BY HKP Performed at [facility], [address] [place], [place] [address]     Lab Basic Metabolic Panel: Recent Labs  Lab 11/29/18 1006 11/30/18 0418 12/02/18 0518 12/03/18 0243 12/04/18 0442  NA 137 134* 132* 131* 132*  K 4.1 4.4 5.1 4.2 4.7  CL 98 100 102 97* 98  CO2 26 25 21* 24 23  GLUCOSE 79 135* 117* 98 175*  BUN 34* 29* 35* 37* 32*  CREATININE 1.07 1.05 1.18 1.08 1.00  CALCIUM 8.8* 8.1* 8.2* 7.8* 8.1*  MG  --   --  2.3  --   --    Liver Function Tests: No results for input(s): AST, ALT, ALKPHOS, BILITOT, PROT, ALBUMIN in the last 168 hours. No results for input(s): LIPASE, AMYLASE in the last 168 hours. No results for input(s): AMMONIA in the last 168 hours. CBC: Recent Labs  Lab 11/29/18 0357 11/30/18 0418 12/02/18 0518 12/03/18 0243 12/04/18 0442  WBC 11.9* 10.4 9.5 10.6* 8.4  NEUTROABS  --  9.3*  --  9.4*  --   HGB 11.5* 11.6* 10.8* 10.4* 11.6*  HCT 35.5* 34.8* 34.4* 31.3* 35.4*  MCV 92.7 91.1 95.6 90.2 91.2  PLT 150 142* PLATELET CLUMPS NOTED ON SMEAR, UNABLE TO ESTIMATE 132* 128*   Cardiac Enzymes: No results for input(s): CKTOTAL, CKMB, CKMBINDEX, TROPONINI in the last 168 hours. Sepsis Labs: Recent Labs  Lab 11/30/18 0418 12/02/18 0518 12/03/18 0243 12/04/18 0442  WBC 10.4 9.5 10.6* 8.4  Procedures/Operations  None        Darel Hong, [facility] Pulmonary & Critical Care Medicine Pager: [phone] Cell: [phone]  Bradly Bienenstock 12-17-18, 5:38 AM

## 2019-01-01 NOTE — Progress Notes (Signed)
Pronounced patient death at 39 by myself along with Manuella Ghazi. Notified Darel Hong NP, [place] [facility], no need to contact additional family as wife and daughter were at bedside for death. Funeral Home information provided by family, [facility].

## 2019-01-01 NOTE — Progress Notes (Signed)
Patient's wife, patient's daughter, and son-in-law are all at bedside.  The patient and his family wish to proceed with comfort measures.  Will place patient on morphine drip for comfort, once patient comfortable will transition from high flow nasal cannula to nasal cannula.  DNR/DNI.   Darel Hong, AGACNP-BC [place] Pulmonary & Critical Care Medicine Pager: [phone] Cell: [phone]

## 2019-01-01 NOTE — Progress Notes (Signed)
   12-20-2018 0100  Clinical Encounter Type  Visited With Patient and family together  Visit Type Initial  Referral From Nurse  Spiritual Encounters  Spiritual Needs Prayer;Emotional;Grief support  Stress Factors  Patient Stress Factors Exhausted;Major life changes  Family Stress Factors Loss;Major life changes  Ch was called to assist Wife and Daughter as the pt was actively dying. Ch helped the family share fond memories of the pt as part of the grieving process and provided a listening ear as they shared their thoughts and emotions. Ch also prayed with the pt upon their request. Even though saddened with grief, family is accepting the situation with much calm and grace. According to family, pt has expressed tiredness with life due to prolonged physical pain.

## 2019-01-01 NOTE — Progress Notes (Addendum)
Spoke to family at approximately 17 or so to explain the difference between the high flow nasal cannula and nasal cannula as respiratory came into the room to take off of oxygen. At this time family would like patient to have some form of oxygen so for comfort put patient on 2L of O2 via nasal cannula. Pnt transitioned at beginning of shift to comfort measures and morphine gtt started.   Increased morphine up to 4ml/hr due to pain assessment. At approximately 0000, asked pnts family if they were comfortable with me removing nasal cannula at this time and wife became very tearful. Family and patient goals of care are for pnt to be comfortable and they would like him to "not know what is going on because he is so out of it". Asked the family if they would like chaplain to come to bedside to speak to them and both daughter and patients wife who are at bedside said they would appreciate that. Chaplain called

## 2019-01-01 DEATH — deceased

## 2021-02-02 IMAGING — CT CT CHEST WITHOUT CONTRAST
2 of 3 series · 15 of 36 positions shown, 18 images · non-contrast
Comparison: Radiograph October 22, 2018. CT scan July 15, 2018.

CLINICAL DATA: Cough, shortness of breath.

EXAM:
CT CHEST WITHOUT CONTRAST
TECHNIQUE: Multidetector CT imaging of the chest was performed following the
standard protocol without IV contrast.

[Series 2: thorax · axial · 0.77mm/px · z∈[-340,-58]mm · 12 of 167 slices shown, 15 images]
[im 13/167  mediastinal]
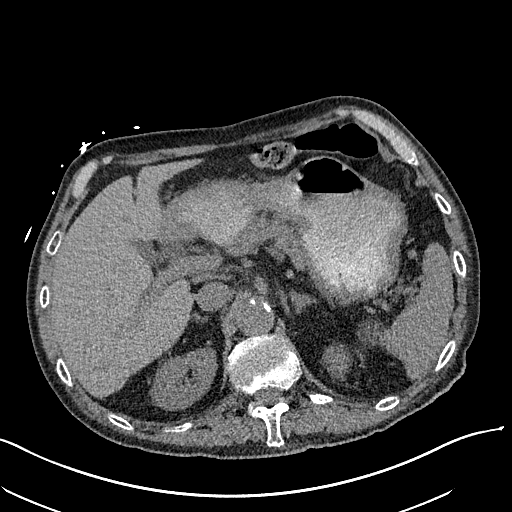
[im 13/167  lung]
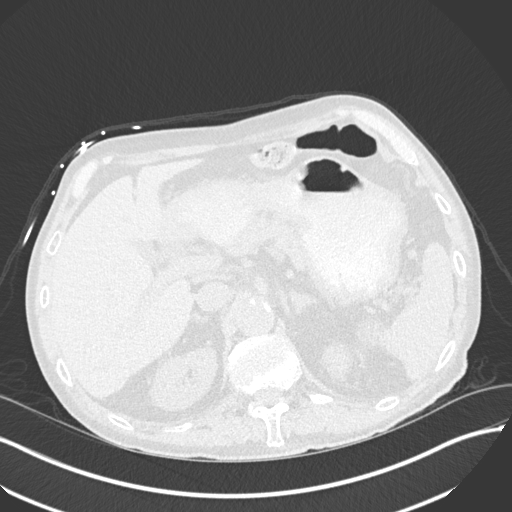
[im 25/167  lung]
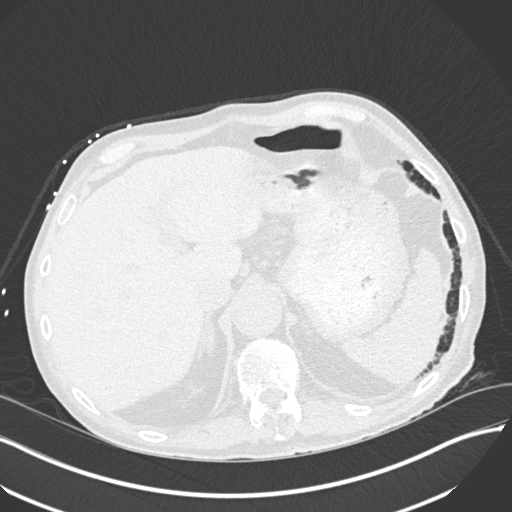
[im 37/167  lung]
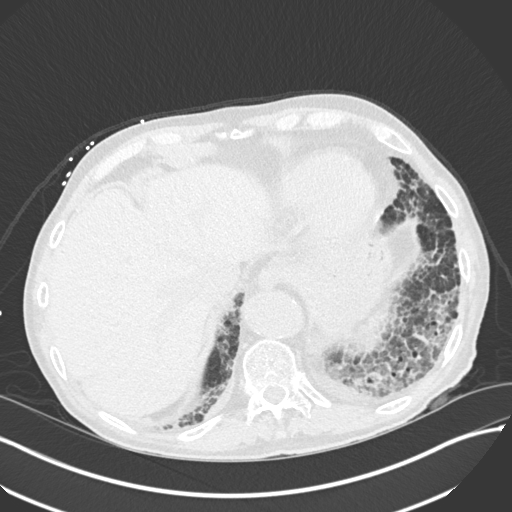
[im 50/167  lung]
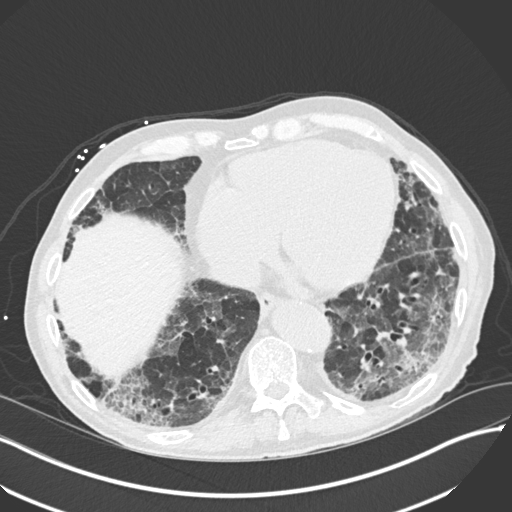
[im 62/167  mediastinal]
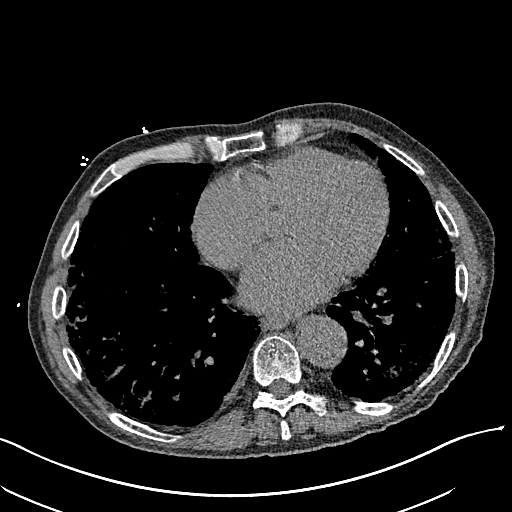
[im 62/167  lung]
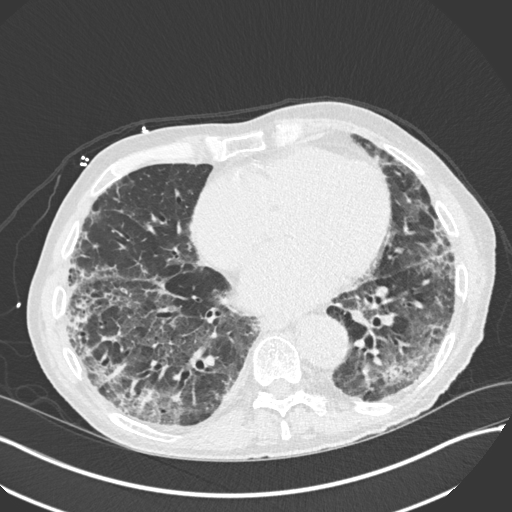
[im 74/167  lung]
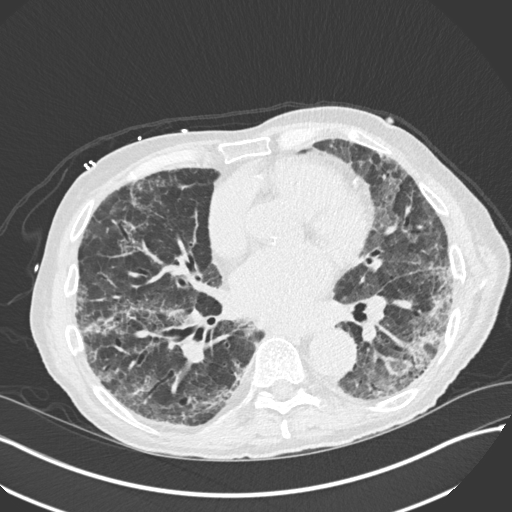
[im 93/167  lung]
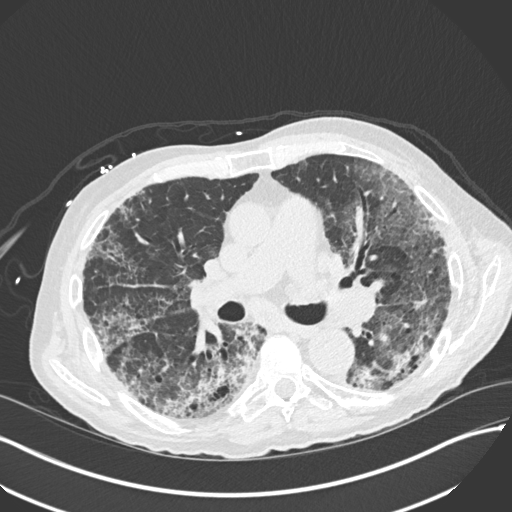
[im 105/167  lung]
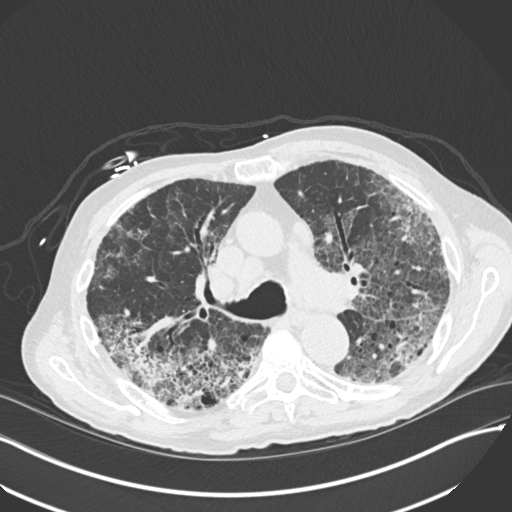
[im 117/167  mediastinal]
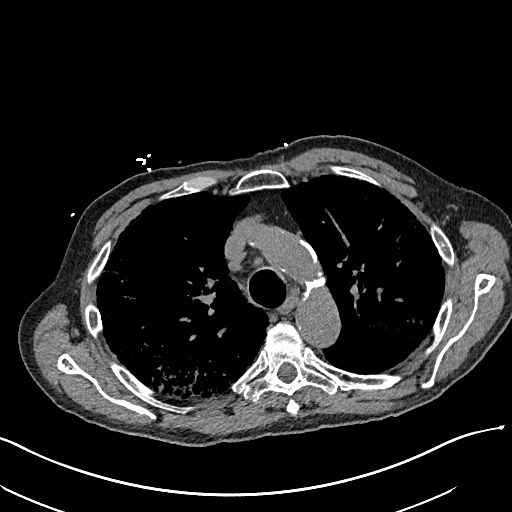
[im 117/167  lung]
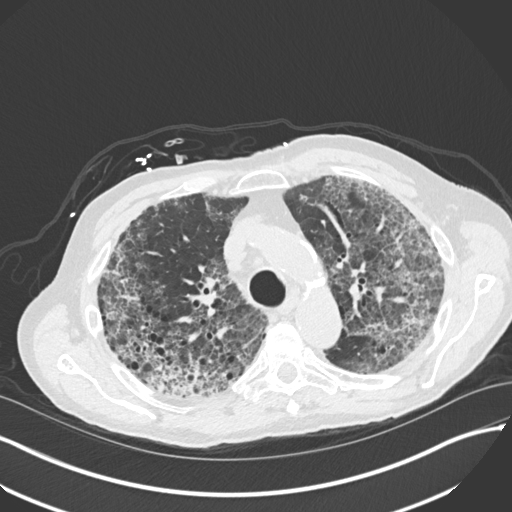
[im 130/167  lung]
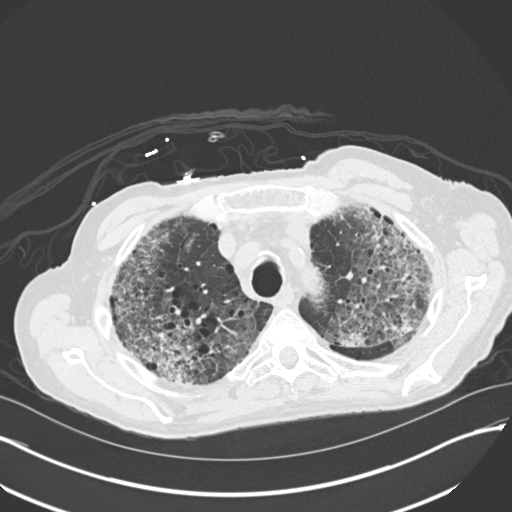
[im 142/167  lung]
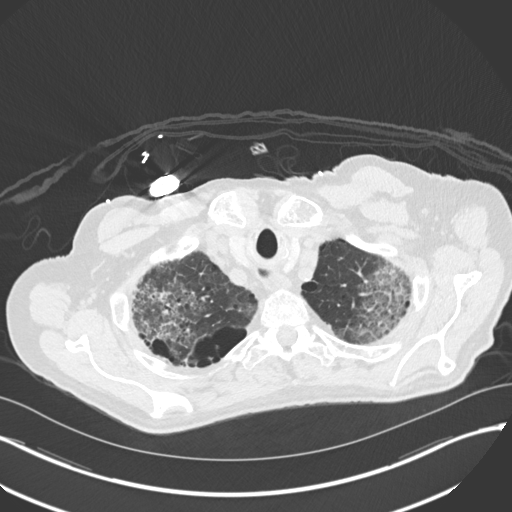
[im 154/167  lung]
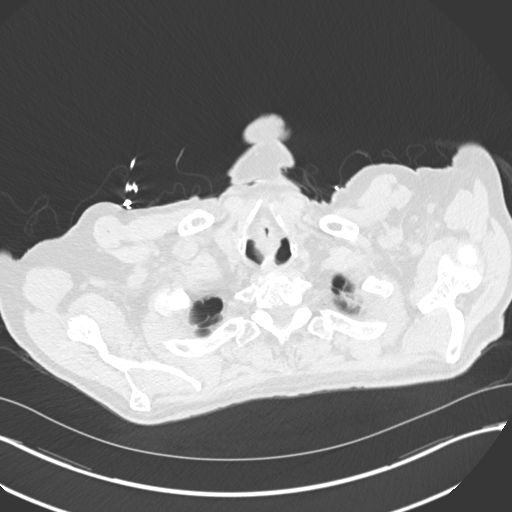

[Series 5: coronal · coronal · 0.68mm/px · 3 of 132 slices shown]
[im 27/132  lung]
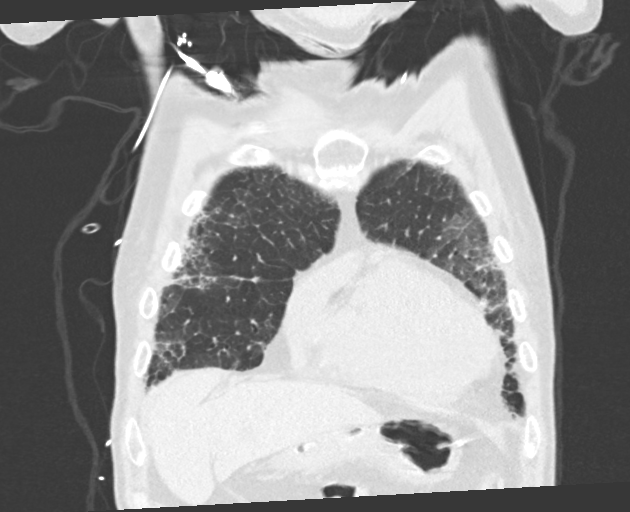
[im 53/132  lung]
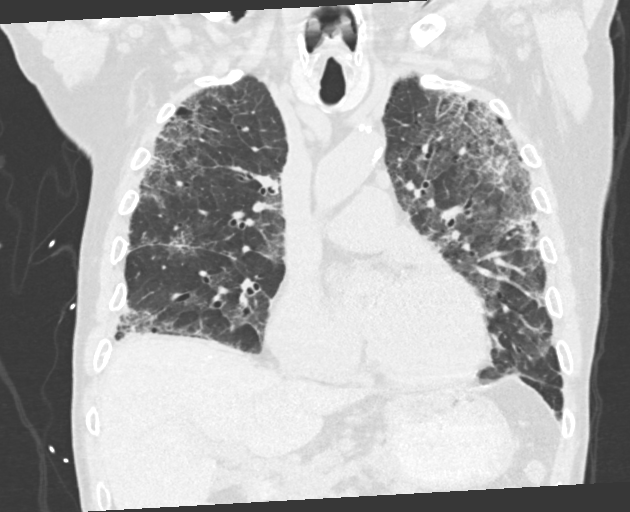
[im 79/132  lung]
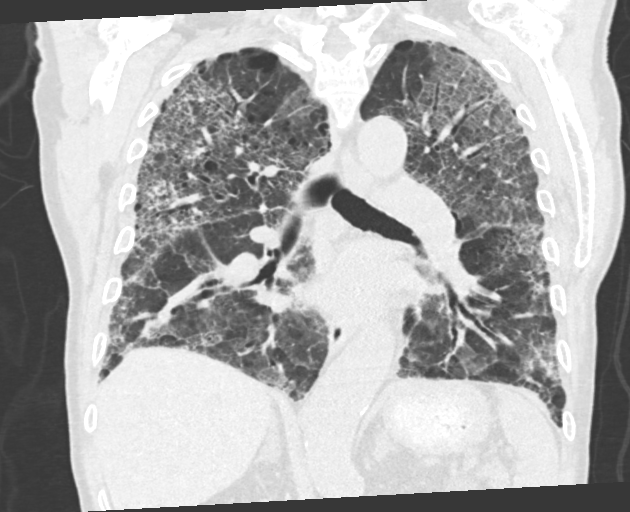

[15 of 36 positions shown; findings below may reference images not displayed]

FINDINGS: Cardiovascular: Atherosclerosis of thoracic aorta is noted without
aneurysm formation. Normal cardiac size. Coronary artery
calcifications are noted. No pericardial effusion.

Mediastinum/Nodes: Thyroid gland and esophagus are unremarkable.
Mildly enlarged mediastinal adenopathy is noted which most likely is
reactive or inflammatory in etiology.

Lungs/Pleura: No pneumothorax or pleural effusion is noted.
Emphysematous disease is noted in both lungs. Bronchiectasis is
noted in both lower lobes. There is interval development of diffuse
reticular and interstitial densities throughout both lungs
concerning for interstitial or atypical pneumonia or possibly edema.

Upper Abdomen: No acute abnormality.

Musculoskeletal: No chest wall mass or suspicious bone lesions
identified.
IMPRESSION: Interval development of diffuse and interstitial densities
throughout both lungs concerning for interstitial or atypical
pneumonia or possibly edema. Bronchiectasis is noted in both lower
lobes. Mildly enlarged mediastinal adenopathy is noted which most
likely is reactive or inflammatory in etiology.

Coronary artery calcifications are noted suggesting coronary artery
disease.

Aortic Atherosclerosis (4SABV-0FM.M) and Emphysema (4SABV-59O.O).

## 2021-02-05 IMAGING — DX PORTABLE CHEST - 1 VIEW
1 series · 1 of 1 positions shown · non-contrast
Comparison: 10/22/2018.

CLINICAL DATA: Increased shortness of breath.

EXAM:
PORTABLE CHEST 1 VIEW

[chest ap]
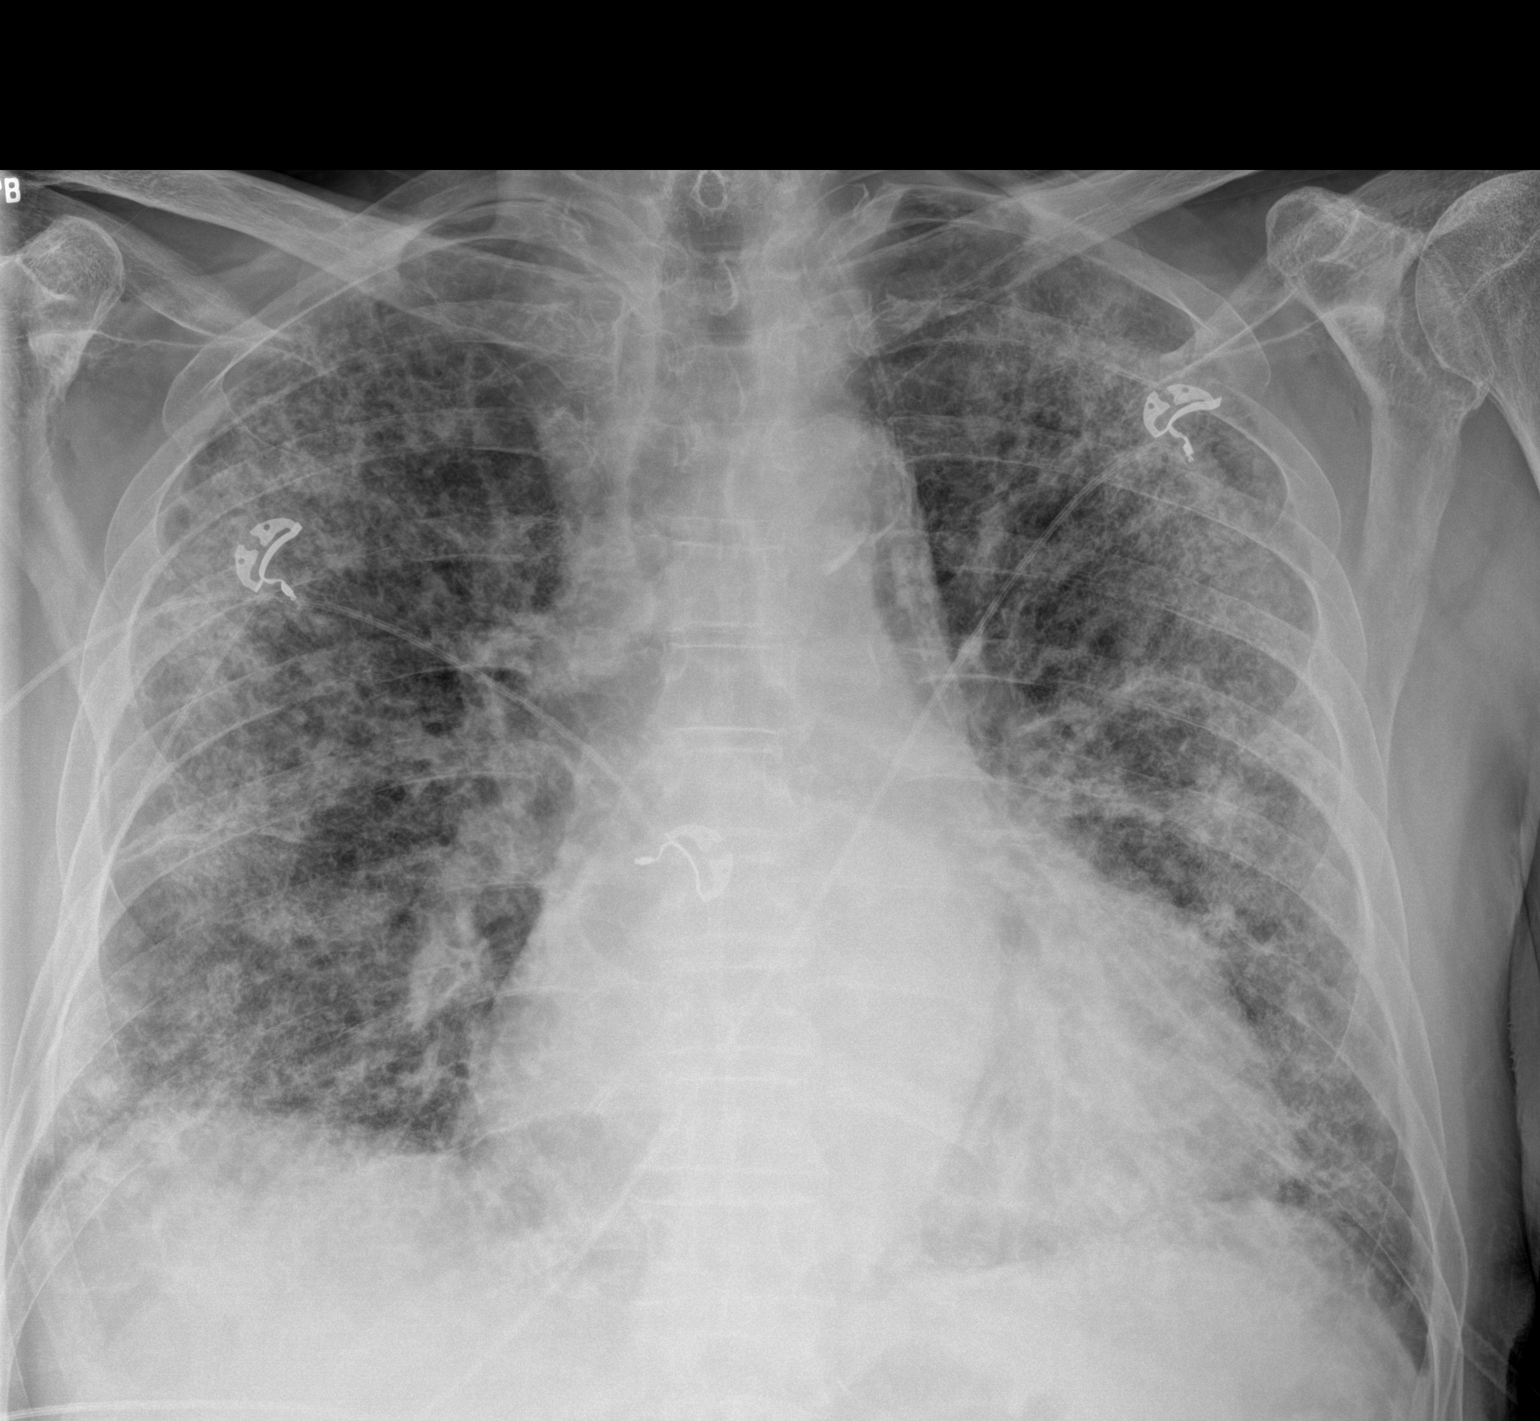

[1 of 1 positions shown; findings below may reference images not displayed]

FINDINGS: Cardiomegaly. Diffuse BILATERAL interstitial and alveolar lung
disease not significantly changed from priors. No pneumothorax or
effusion. Thoracic atherosclerosis. Bones unremarkable.
IMPRESSION: Stable chest.

## 2021-06-02 DEATH — deceased
